# Patient Record
Sex: Male | Born: 1940 | Race: Black or African American | Hispanic: No | Marital: Married | Smoking: Former smoker
Health system: Southern US, Community
[De-identification: ages and names within clinical notes are randomized; demographics above are authoritative.]

## PROBLEM LIST (undated history)

## (undated) DIAGNOSIS — Z85038 Personal history of other malignant neoplasm of large intestine: Secondary | ICD-10-CM

## (undated) DIAGNOSIS — F32A Depression, unspecified: Secondary | ICD-10-CM

## (undated) DIAGNOSIS — N183 Chronic kidney disease, stage 3 unspecified: Secondary | ICD-10-CM

## (undated) DIAGNOSIS — E119 Type 2 diabetes mellitus without complications: Secondary | ICD-10-CM

## (undated) DIAGNOSIS — E7800 Pure hypercholesterolemia, unspecified: Secondary | ICD-10-CM

## (undated) DIAGNOSIS — I10 Essential (primary) hypertension: Secondary | ICD-10-CM

## (undated) DIAGNOSIS — E11319 Type 2 diabetes mellitus with unspecified diabetic retinopathy without macular edema: Secondary | ICD-10-CM

## (undated) DIAGNOSIS — Z8739 Personal history of other diseases of the musculoskeletal system and connective tissue: Secondary | ICD-10-CM

## (undated) DIAGNOSIS — R296 Repeated falls: Secondary | ICD-10-CM

## (undated) DIAGNOSIS — I639 Cerebral infarction, unspecified: Secondary | ICD-10-CM

## (undated) DIAGNOSIS — R413 Other amnesia: Secondary | ICD-10-CM

## (undated) DIAGNOSIS — I700 Atherosclerosis of aorta: Secondary | ICD-10-CM

## (undated) DIAGNOSIS — J449 Chronic obstructive pulmonary disease, unspecified: Secondary | ICD-10-CM

## (undated) DIAGNOSIS — K219 Gastro-esophageal reflux disease without esophagitis: Secondary | ICD-10-CM

## (undated) DIAGNOSIS — F329 Major depressive disorder, single episode, unspecified: Secondary | ICD-10-CM

## (undated) HISTORY — DX: Depression, unspecified: F32.A

## (undated) HISTORY — DX: Essential (primary) hypertension: I10

## (undated) HISTORY — DX: Pure hypercholesterolemia, unspecified: E78.00

## (undated) HISTORY — DX: Chronic obstructive pulmonary disease, unspecified: J44.9

## (undated) HISTORY — DX: Cerebral infarction, unspecified: I63.9

## (undated) HISTORY — DX: Chronic kidney disease, stage 3 unspecified: N18.30

## (undated) HISTORY — DX: Atherosclerosis of aorta: I70.0

## (undated) HISTORY — DX: Other amnesia: R41.3

## (undated) HISTORY — DX: Type 2 diabetes mellitus with unspecified diabetic retinopathy without macular edema: E11.319

## (undated) HISTORY — DX: Gastro-esophageal reflux disease without esophagitis: K21.9

## (undated) HISTORY — DX: Major depressive disorder, single episode, unspecified: F32.9

## (undated) HISTORY — DX: Personal history of other diseases of the musculoskeletal system and connective tissue: Z87.39

## (undated) HISTORY — DX: Repeated falls: R29.6

## (undated) HISTORY — DX: Personal history of other malignant neoplasm of large intestine: Z85.038

## (undated) HISTORY — DX: Type 2 diabetes mellitus without complications: E11.9

---

## 1999-01-04 ENCOUNTER — Ambulatory Visit (HOSPITAL_COMMUNITY): Admission: RE | Admit: 1999-01-04 | Discharge: 1999-01-04 | Payer: Self-pay | Admitting: Cardiovascular Disease

## 2000-01-14 ENCOUNTER — Emergency Department (HOSPITAL_COMMUNITY): Admission: EM | Admit: 2000-01-14 | Discharge: 2000-01-14 | Payer: Self-pay | Admitting: Emergency Medicine

## 2000-01-14 ENCOUNTER — Encounter: Payer: Self-pay | Admitting: Emergency Medicine

## 2000-01-15 ENCOUNTER — Encounter: Payer: Self-pay | Admitting: Emergency Medicine

## 2000-01-15 ENCOUNTER — Inpatient Hospital Stay (HOSPITAL_COMMUNITY): Admission: EM | Admit: 2000-01-15 | Discharge: 2000-01-30 | Payer: Self-pay | Admitting: Emergency Medicine

## 2000-01-15 ENCOUNTER — Encounter (INDEPENDENT_AMBULATORY_CARE_PROVIDER_SITE_OTHER): Payer: Self-pay | Admitting: *Deleted

## 2000-01-16 ENCOUNTER — Encounter: Payer: Self-pay | Admitting: Internal Medicine

## 2000-01-18 ENCOUNTER — Encounter: Payer: Self-pay | Admitting: Internal Medicine

## 2000-01-19 ENCOUNTER — Encounter: Payer: Self-pay | Admitting: Internal Medicine

## 2000-01-20 ENCOUNTER — Encounter: Payer: Self-pay | Admitting: Internal Medicine

## 2000-01-21 ENCOUNTER — Encounter: Payer: Self-pay | Admitting: Thoracic Surgery

## 2000-01-22 ENCOUNTER — Encounter: Payer: Self-pay | Admitting: Thoracic Surgery

## 2000-01-23 ENCOUNTER — Encounter: Payer: Self-pay | Admitting: Thoracic Surgery

## 2000-01-24 ENCOUNTER — Encounter: Payer: Self-pay | Admitting: Thoracic Surgery

## 2000-01-25 ENCOUNTER — Encounter: Payer: Self-pay | Admitting: Thoracic Surgery

## 2000-01-26 ENCOUNTER — Encounter: Payer: Self-pay | Admitting: Thoracic Surgery

## 2000-01-27 ENCOUNTER — Encounter: Payer: Self-pay | Admitting: Thoracic Surgery

## 2000-01-29 ENCOUNTER — Encounter: Payer: Self-pay | Admitting: Thoracic Surgery

## 2000-02-15 ENCOUNTER — Encounter: Payer: Self-pay | Admitting: Thoracic Surgery

## 2000-02-15 ENCOUNTER — Encounter: Admission: RE | Admit: 2000-02-15 | Discharge: 2000-02-15 | Payer: Self-pay | Admitting: Thoracic Surgery

## 2000-02-16 ENCOUNTER — Encounter: Admission: RE | Admit: 2000-02-16 | Discharge: 2000-02-16 | Payer: Self-pay | Admitting: Family Medicine

## 2000-03-07 ENCOUNTER — Encounter: Payer: Self-pay | Admitting: Thoracic Surgery

## 2000-03-07 ENCOUNTER — Encounter: Admission: RE | Admit: 2000-03-07 | Discharge: 2000-03-07 | Payer: Self-pay | Admitting: Thoracic Surgery

## 2000-03-20 ENCOUNTER — Encounter: Admission: RE | Admit: 2000-03-20 | Discharge: 2000-03-20 | Payer: Self-pay | Admitting: Family Medicine

## 2000-04-04 ENCOUNTER — Encounter: Admission: RE | Admit: 2000-04-04 | Discharge: 2000-04-04 | Payer: Self-pay | Admitting: Family Medicine

## 2000-04-06 ENCOUNTER — Encounter: Payer: Self-pay | Admitting: Thoracic Surgery

## 2000-04-06 ENCOUNTER — Encounter: Admission: RE | Admit: 2000-04-06 | Discharge: 2000-04-06 | Payer: Self-pay | Admitting: Thoracic Surgery

## 2000-06-05 ENCOUNTER — Encounter: Payer: Self-pay | Admitting: Thoracic Surgery

## 2000-06-05 ENCOUNTER — Encounter: Admission: RE | Admit: 2000-06-05 | Discharge: 2000-06-05 | Payer: Self-pay | Admitting: Thoracic Surgery

## 2000-06-08 ENCOUNTER — Encounter: Admission: RE | Admit: 2000-06-08 | Discharge: 2000-06-08 | Payer: Self-pay | Admitting: Family Medicine

## 2000-06-18 ENCOUNTER — Encounter: Admission: RE | Admit: 2000-06-18 | Discharge: 2000-06-18 | Payer: Self-pay | Admitting: Family Medicine

## 2000-06-19 ENCOUNTER — Encounter: Admission: RE | Admit: 2000-06-19 | Discharge: 2000-09-17 | Payer: Self-pay | Admitting: *Deleted

## 2000-07-31 ENCOUNTER — Encounter: Admission: RE | Admit: 2000-07-31 | Discharge: 2000-07-31 | Payer: Self-pay | Admitting: Family Medicine

## 2000-09-26 ENCOUNTER — Encounter: Admission: RE | Admit: 2000-09-26 | Discharge: 2000-09-26 | Payer: Self-pay | Admitting: Family Medicine

## 2001-06-03 ENCOUNTER — Encounter: Admission: RE | Admit: 2001-06-03 | Discharge: 2001-06-03 | Payer: Self-pay | Admitting: Family Medicine

## 2001-06-17 ENCOUNTER — Encounter: Admission: RE | Admit: 2001-06-17 | Discharge: 2001-06-17 | Payer: Self-pay | Admitting: Family Medicine

## 2001-07-17 ENCOUNTER — Encounter: Admission: RE | Admit: 2001-07-17 | Discharge: 2001-07-17 | Payer: Self-pay | Admitting: Family Medicine

## 2001-09-04 ENCOUNTER — Encounter: Admission: RE | Admit: 2001-09-04 | Discharge: 2001-09-04 | Payer: Self-pay | Admitting: Family Medicine

## 2001-09-18 ENCOUNTER — Encounter: Admission: RE | Admit: 2001-09-18 | Discharge: 2001-09-18 | Payer: Self-pay | Admitting: Family Medicine

## 2002-01-14 ENCOUNTER — Encounter: Admission: RE | Admit: 2002-01-14 | Discharge: 2002-01-14 | Payer: Self-pay | Admitting: Family Medicine

## 2002-03-18 ENCOUNTER — Encounter: Admission: RE | Admit: 2002-03-18 | Discharge: 2002-03-18 | Payer: Self-pay | Admitting: Sports Medicine

## 2002-04-23 ENCOUNTER — Encounter: Admission: RE | Admit: 2002-04-23 | Discharge: 2002-04-23 | Payer: Self-pay | Admitting: Family Medicine

## 2002-06-10 ENCOUNTER — Encounter: Admission: RE | Admit: 2002-06-10 | Discharge: 2002-06-10 | Payer: Self-pay | Admitting: Family Medicine

## 2002-06-24 ENCOUNTER — Encounter: Admission: RE | Admit: 2002-06-24 | Discharge: 2002-06-24 | Payer: Self-pay | Admitting: Family Medicine

## 2002-08-05 ENCOUNTER — Encounter: Admission: RE | Admit: 2002-08-05 | Discharge: 2002-08-05 | Payer: Self-pay | Admitting: Family Medicine

## 2003-06-12 ENCOUNTER — Encounter: Admission: RE | Admit: 2003-06-12 | Discharge: 2003-06-12 | Payer: Self-pay | Admitting: Family Medicine

## 2003-07-20 ENCOUNTER — Encounter: Admission: RE | Admit: 2003-07-20 | Discharge: 2003-07-20 | Payer: Self-pay | Admitting: Family Medicine

## 2006-10-18 DIAGNOSIS — F1010 Alcohol abuse, uncomplicated: Secondary | ICD-10-CM | POA: Insufficient documentation

## 2006-10-18 DIAGNOSIS — K219 Gastro-esophageal reflux disease without esophagitis: Secondary | ICD-10-CM | POA: Insufficient documentation

## 2006-10-18 DIAGNOSIS — J4489 Other specified chronic obstructive pulmonary disease: Secondary | ICD-10-CM | POA: Insufficient documentation

## 2006-10-18 DIAGNOSIS — E119 Type 2 diabetes mellitus without complications: Secondary | ICD-10-CM

## 2006-10-18 DIAGNOSIS — J449 Chronic obstructive pulmonary disease, unspecified: Secondary | ICD-10-CM

## 2006-10-18 DIAGNOSIS — F339 Major depressive disorder, recurrent, unspecified: Secondary | ICD-10-CM

## 2006-12-17 ENCOUNTER — Ambulatory Visit (HOSPITAL_BASED_OUTPATIENT_CLINIC_OR_DEPARTMENT_OTHER): Admission: RE | Admit: 2006-12-17 | Discharge: 2006-12-17 | Payer: Self-pay | Admitting: Unknown Physician Specialty

## 2006-12-17 ENCOUNTER — Encounter (INDEPENDENT_AMBULATORY_CARE_PROVIDER_SITE_OTHER): Payer: Self-pay | Admitting: Specialist

## 2009-02-18 ENCOUNTER — Encounter: Admission: RE | Admit: 2009-02-18 | Discharge: 2009-02-18 | Payer: Self-pay | Admitting: Surgery

## 2009-03-05 ENCOUNTER — Inpatient Hospital Stay (HOSPITAL_COMMUNITY): Admission: RE | Admit: 2009-03-05 | Discharge: 2009-03-09 | Payer: Self-pay | Admitting: Surgery

## 2009-03-05 ENCOUNTER — Encounter (INDEPENDENT_AMBULATORY_CARE_PROVIDER_SITE_OTHER): Payer: Self-pay | Admitting: Surgery

## 2009-03-29 ENCOUNTER — Ambulatory Visit: Payer: Self-pay | Admitting: Oncology

## 2009-04-01 LAB — CBC WITH DIFFERENTIAL/PLATELET
BASO%: 0 % (ref 0.0–2.0)
EOS%: 1.3 % (ref 0.0–7.0)
HCT: 33.5 % — ABNORMAL LOW (ref 38.4–49.9)
LYMPH%: 23.9 % (ref 14.0–49.0)
MCH: 21.1 pg — ABNORMAL LOW (ref 27.2–33.4)
MCHC: 31.4 g/dL — ABNORMAL LOW (ref 32.0–36.0)
MCV: 67.3 fL — ABNORMAL LOW (ref 79.3–98.0)
MONO%: 13.9 % (ref 0.0–14.0)
NEUT%: 60.9 % (ref 39.0–75.0)
lymph#: 1.7 10*3/uL (ref 0.9–3.3)

## 2009-04-01 LAB — COMPREHENSIVE METABOLIC PANEL
ALT: 10 U/L (ref 0–53)
AST: 14 U/L (ref 0–37)
Alkaline Phosphatase: 56 U/L (ref 39–117)
Chloride: 108 mEq/L (ref 96–112)
Creatinine, Ser: 0.87 mg/dL (ref 0.40–1.50)
Total Bilirubin: 0.3 mg/dL (ref 0.3–1.2)

## 2009-06-29 ENCOUNTER — Ambulatory Visit: Payer: Self-pay | Admitting: Oncology

## 2009-07-01 LAB — COMPREHENSIVE METABOLIC PANEL
Albumin: 4.4 g/dL (ref 3.5–5.2)
CO2: 25 mEq/L (ref 19–32)
Glucose, Bld: 195 mg/dL — ABNORMAL HIGH (ref 70–99)
Potassium: 3.9 mEq/L (ref 3.5–5.3)
Sodium: 141 mEq/L (ref 135–145)
Total Protein: 7.3 g/dL (ref 6.0–8.3)

## 2009-07-01 LAB — CBC WITH DIFFERENTIAL/PLATELET
Basophils Absolute: 0 10*3/uL (ref 0.0–0.1)
Eosinophils Absolute: 0.1 10*3/uL (ref 0.0–0.5)
HGB: 14.3 g/dL (ref 13.0–17.1)
MONO#: 0.5 10*3/uL (ref 0.1–0.9)
NEUT#: 3.5 10*3/uL (ref 1.5–6.5)
RDW: 17 % — ABNORMAL HIGH (ref 11.0–14.6)
lymph#: 1.9 10*3/uL (ref 0.9–3.3)

## 2009-07-01 LAB — IRON AND TIBC
%SAT: 19 % — ABNORMAL LOW (ref 20–55)
Iron: 71 ug/dL (ref 42–165)
TIBC: 368 ug/dL (ref 215–435)

## 2009-07-01 LAB — FERRITIN: Ferritin: 43 ng/mL (ref 22–322)

## 2009-07-01 LAB — CEA: CEA: 3.2 ng/mL (ref 0.0–5.0)

## 2009-09-24 ENCOUNTER — Ambulatory Visit: Payer: Self-pay | Admitting: Oncology

## 2009-09-28 ENCOUNTER — Ambulatory Visit (HOSPITAL_COMMUNITY): Admission: RE | Admit: 2009-09-28 | Discharge: 2009-09-28 | Payer: Self-pay | Admitting: Oncology

## 2009-09-28 LAB — COMPREHENSIVE METABOLIC PANEL
AST: 29 U/L (ref 0–37)
Albumin: 4.1 g/dL (ref 3.5–5.2)
Alkaline Phosphatase: 59 U/L (ref 39–117)
BUN: 7 mg/dL (ref 6–23)
Calcium: 9.9 mg/dL (ref 8.4–10.5)
Chloride: 103 mEq/L (ref 96–112)
Creatinine, Ser: 0.96 mg/dL (ref 0.40–1.50)
Total Bilirubin: 0.6 mg/dL (ref 0.3–1.2)
Total Protein: 7.2 g/dL (ref 6.0–8.3)

## 2009-09-28 LAB — CBC WITH DIFFERENTIAL/PLATELET
EOS%: 1.9 % (ref 0.0–7.0)
Eosinophils Absolute: 0.1 10*3/uL (ref 0.0–0.5)
HGB: 14.6 g/dL (ref 13.0–17.1)
MCHC: 33.5 g/dL (ref 32.0–36.0)
MONO#: 0.7 10*3/uL (ref 0.1–0.9)
MONO%: 9.2 % (ref 0.0–14.0)
NEUT%: 63.6 % (ref 39.0–75.0)
Platelets: 163 10*3/uL (ref 140–400)
WBC: 7.1 10*3/uL (ref 4.0–10.3)

## 2010-02-07 ENCOUNTER — Ambulatory Visit: Payer: Self-pay | Admitting: Oncology

## 2010-02-09 LAB — CBC WITH DIFFERENTIAL/PLATELET
Basophils Absolute: 0 10*3/uL (ref 0.0–0.1)
EOS%: 1.5 % (ref 0.0–7.0)
HGB: 14.4 g/dL (ref 13.0–17.1)
MCV: 83.8 fL (ref 79.3–98.0)
MONO#: 0.7 10*3/uL (ref 0.1–0.9)
NEUT%: 57 % (ref 39.0–75.0)
RBC: 5.07 10*6/uL (ref 4.20–5.82)
lymph#: 2 10*3/uL (ref 0.9–3.3)

## 2010-02-09 LAB — COMPREHENSIVE METABOLIC PANEL
Alkaline Phosphatase: 63 U/L (ref 39–117)
BUN: 10 mg/dL (ref 6–23)
Calcium: 9.8 mg/dL (ref 8.4–10.5)
Glucose, Bld: 143 mg/dL — ABNORMAL HIGH (ref 70–99)
Potassium: 4 mEq/L (ref 3.5–5.3)
Sodium: 137 mEq/L (ref 135–145)
Total Bilirubin: 0.5 mg/dL (ref 0.3–1.2)
Total Protein: 7.1 g/dL (ref 6.0–8.3)

## 2010-06-07 ENCOUNTER — Ambulatory Visit: Payer: Self-pay | Admitting: Oncology

## 2010-06-09 LAB — CBC WITH DIFFERENTIAL/PLATELET
BASO%: 0 % (ref 0.0–2.0)
EOS%: 0.9 % (ref 0.0–7.0)
Eosinophils Absolute: 0.1 10*3/uL (ref 0.0–0.5)
HCT: 41.7 % (ref 38.4–49.9)
HGB: 14.2 g/dL (ref 13.0–17.1)
MCHC: 34 g/dL (ref 32.0–36.0)
MCV: 84 fL (ref 79.3–98.0)
RBC: 4.96 10*6/uL (ref 4.20–5.82)
RDW: 14.1 % (ref 11.0–14.6)
WBC: 7 10*3/uL (ref 4.0–10.3)

## 2010-06-09 LAB — COMPREHENSIVE METABOLIC PANEL
Calcium: 9.9 mg/dL (ref 8.4–10.5)
Glucose, Bld: 172 mg/dL — ABNORMAL HIGH (ref 70–99)
Potassium: 3.9 mEq/L (ref 3.5–5.3)
Total Bilirubin: 0.6 mg/dL (ref 0.3–1.2)
Total Protein: 6.8 g/dL (ref 6.0–8.3)

## 2010-06-09 LAB — CEA: CEA: 3.8 ng/mL (ref 0.0–5.0)

## 2010-09-09 ENCOUNTER — Other Ambulatory Visit: Payer: Self-pay | Admitting: Oncology

## 2010-09-09 DIAGNOSIS — C189 Malignant neoplasm of colon, unspecified: Secondary | ICD-10-CM

## 2010-09-09 DIAGNOSIS — R9389 Abnormal findings on diagnostic imaging of other specified body structures: Secondary | ICD-10-CM

## 2010-09-11 ENCOUNTER — Encounter: Payer: Self-pay | Admitting: Oncology

## 2010-10-05 ENCOUNTER — Other Ambulatory Visit (HOSPITAL_COMMUNITY): Payer: Self-pay

## 2010-10-10 ENCOUNTER — Ambulatory Visit (HOSPITAL_COMMUNITY)
Admission: RE | Admit: 2010-10-10 | Discharge: 2010-10-10 | Disposition: A | Discharge status: Home / Self Care | Payer: MEDICARE | Source: Ambulatory Visit | Attending: Oncology | Admitting: Oncology

## 2010-10-10 ENCOUNTER — Other Ambulatory Visit: Payer: Self-pay | Admitting: Oncology

## 2010-10-10 ENCOUNTER — Encounter (HOSPITAL_BASED_OUTPATIENT_CLINIC_OR_DEPARTMENT_OTHER): Payer: MEDICARE | Admitting: Oncology

## 2010-10-10 DIAGNOSIS — C189 Malignant neoplasm of colon, unspecified: Secondary | ICD-10-CM

## 2010-10-10 DIAGNOSIS — E041 Nontoxic single thyroid nodule: Secondary | ICD-10-CM | POA: Insufficient documentation

## 2010-10-10 DIAGNOSIS — D509 Iron deficiency anemia, unspecified: Secondary | ICD-10-CM

## 2010-10-10 DIAGNOSIS — K8020 Calculus of gallbladder without cholecystitis without obstruction: Secondary | ICD-10-CM | POA: Insufficient documentation

## 2010-10-10 DIAGNOSIS — K7689 Other specified diseases of liver: Secondary | ICD-10-CM | POA: Insufficient documentation

## 2010-10-10 DIAGNOSIS — N400 Enlarged prostate without lower urinary tract symptoms: Secondary | ICD-10-CM | POA: Insufficient documentation

## 2010-10-10 DIAGNOSIS — R9389 Abnormal findings on diagnostic imaging of other specified body structures: Secondary | ICD-10-CM

## 2010-10-10 LAB — CBC WITH DIFFERENTIAL/PLATELET
BASO%: 0.3 % (ref 0.0–2.0)
Eosinophils Absolute: 0.1 10*3/uL (ref 0.0–0.5)
HCT: 40.6 % (ref 38.4–49.9)
HGB: 14.1 g/dL (ref 13.0–17.1)
LYMPH%: 30.9 % (ref 14.0–49.0)
MONO#: 0.8 10*3/uL (ref 0.1–0.9)
MONO%: 10.5 % (ref 0.0–14.0)
NEUT%: 56.7 % (ref 39.0–75.0)
WBC: 7.7 10*3/uL (ref 4.0–10.3)

## 2010-10-10 LAB — CMP (CANCER CENTER ONLY)
AST: 28 U/L (ref 11–38)
Albumin: 3.6 g/dL (ref 3.3–5.5)
Alkaline Phosphatase: 55 U/L (ref 26–84)
BUN, Bld: 9 mg/dL (ref 7–22)

## 2010-10-10 LAB — CEA: CEA: 3.9 ng/mL (ref 0.0–5.0)

## 2010-10-10 MED ORDER — IOHEXOL 300 MG/ML  SOLN
100.0000 mL | Freq: Once | INTRAMUSCULAR | Status: AC | PRN
  Administered 2010-10-10: 100 mL via INTRAVENOUS

## 2010-10-12 ENCOUNTER — Encounter (HOSPITAL_BASED_OUTPATIENT_CLINIC_OR_DEPARTMENT_OTHER): Payer: MEDICARE | Admitting: Oncology

## 2010-10-12 DIAGNOSIS — D509 Iron deficiency anemia, unspecified: Secondary | ICD-10-CM

## 2010-10-12 DIAGNOSIS — C189 Malignant neoplasm of colon, unspecified: Secondary | ICD-10-CM

## 2010-11-27 LAB — CBC
HCT: 23.2 % — ABNORMAL LOW (ref 39.0–52.0)
HCT: 23.9 % — ABNORMAL LOW (ref 39.0–52.0)
HCT: 26.3 % — ABNORMAL LOW (ref 39.0–52.0)
HCT: 29.2 % — ABNORMAL LOW (ref 39.0–52.0)
Hemoglobin: 7.4 g/dL — CL (ref 13.0–17.0)
Hemoglobin: 9 g/dL — ABNORMAL LOW (ref 13.0–17.0)
MCHC: 30.9 g/dL (ref 30.0–36.0)
MCV: 63.4 fL — ABNORMAL LOW (ref 78.0–100.0)
MCV: 64.4 fL — ABNORMAL LOW (ref 78.0–100.0)
MCV: 64.9 fL — ABNORMAL LOW (ref 78.0–100.0)
Platelets: 205 10*3/uL (ref 150–400)
RBC: 3.66 MIL/uL — ABNORMAL LOW (ref 4.22–5.81)
RBC: 4.48 MIL/uL (ref 4.22–5.81)
RDW: 23.1 % — ABNORMAL HIGH (ref 11.5–15.5)
WBC: 11.7 10*3/uL — ABNORMAL HIGH (ref 4.0–10.5)
WBC: 9.9 10*3/uL (ref 4.0–10.5)

## 2010-11-27 LAB — GLUCOSE, CAPILLARY
Glucose-Capillary: 100 mg/dL — ABNORMAL HIGH (ref 70–99)
Glucose-Capillary: 118 mg/dL — ABNORMAL HIGH (ref 70–99)
Glucose-Capillary: 121 mg/dL — ABNORMAL HIGH (ref 70–99)
Glucose-Capillary: 129 mg/dL — ABNORMAL HIGH (ref 70–99)
Glucose-Capillary: 132 mg/dL — ABNORMAL HIGH (ref 70–99)
Glucose-Capillary: 132 mg/dL — ABNORMAL HIGH (ref 70–99)
Glucose-Capillary: 143 mg/dL — ABNORMAL HIGH (ref 70–99)
Glucose-Capillary: 154 mg/dL — ABNORMAL HIGH (ref 70–99)
Glucose-Capillary: 180 mg/dL — ABNORMAL HIGH (ref 70–99)
Glucose-Capillary: 91 mg/dL (ref 70–99)

## 2010-11-27 LAB — BASIC METABOLIC PANEL
BUN: 4 mg/dL — ABNORMAL LOW (ref 6–23)
CO2: 25 mEq/L (ref 19–32)
Calcium: 9.7 mg/dL (ref 8.4–10.5)
Chloride: 107 mEq/L (ref 96–112)
Chloride: 110 mEq/L (ref 96–112)
Chloride: 107 mEq/L (ref 96–112)
GFR calc Af Amer: >60 mL/min (ref >60)
GFR calc Af Amer: >60 mL/min (ref >60)
GFR calc non Af Amer: >60 mL/min (ref >60)
GFR calc non Af Amer: >60 mL/min (ref >60)
Potassium: 3.4 mEq/L — ABNORMAL LOW (ref 3.5–5.1)
Potassium: 3.4 mEq/L — ABNORMAL LOW (ref 3.5–5.1)
Potassium: 4.3 mEq/L (ref 3.5–5.1)
Sodium: 143 mEq/L (ref 135–145)
Sodium: 140 mEq/L (ref 135–145)

## 2010-11-27 LAB — CREATININE, SERUM
Creatinine, Ser: 0.9 mg/dL (ref 0.4–1.5)
GFR calc Af Amer: >60 mL/min (ref >60)
GFR calc non Af Amer: >60 mL/min (ref >60)

## 2010-11-27 LAB — POTASSIUM: Potassium: 3.9 mEq/L (ref 3.5–5.1)

## 2011-01-03 NOTE — Discharge Summary (Signed)
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   OTHER DIAGNOSES:  1. Anemia secondary to above.  2. Diabetes non-insulin-requiring.  3. Hyperlipidemia.  4. Pneumonia with empyema status post chest tube thoracostomy in 2001.  5. Erectile dysfunction.   PROCEDURE PERFORMED:  Laparoscopic-assisted right partial colectomy with  lysis of adhesions anastomosis on March 05, 2009.   Pathology shows pT3, PN0 adenocarcinoma, 0/22 lymph nodes positive, KRAS  pending, perhaps consistent with a very early pT3 adenocarcinoma, grade  2, margins free.   SUMMARY OF HOSPITAL COURSE:  Mr. Esquivias is a 70 year old male with  anemia and heme-positive stool was found by endoscopy to have numerous  polyps and a large polyp in the proximal ascending colon with biopsy  consistent with adenocarcinoma.  Surgery consultation was made with me.  After discussion and informed consent, he underwent laparoscopic lysis  of adhesions and colectomy with anastomosis on March 05, 2009.   He was started on an anti-ileus protocol and it was advanced on his  diet.  He was gradually transitioned off IV fluids.  He did have  moderate anemia with his hemoglobin going from 9 to the 7s, but he  remained hemodynamically stable with no hypotension.  Had some mild  hypokalemia, which was improved with oral supplementation.  He was  walking in the hallways without difficulty.  He was tolerating solid  diet  without any difficulty.  He had flatus and he had a bowel movement.  His pain was controlled with oral medications.   Based on these improvements, it will be reasonable to discharge him home  with follow instructions:  1. He is to return to clinic to see me in about 10 days to make sure      he is continuing to improve.  2. He should take continuous oral iron for his anemia to allow his      body recuperate.  3. He should take oxycodone 5-15 mg p.o. q.4 h. p.r.n. pain along with      Aleve 1-2 p.o. q.12 h. p.r.n. pain and ice pack and/or heating pad      p.r.n. pain.  4. He should take Phenergan 12.5-25 mg p.o. q.6 h. p.r.n. nausea.  5. He should call if he has worsening fevers, chills, sweats, nausea,      vomiting, worsening abdominal pain, uncontrolled diarrhea, drainage      from incisions, wound infection, or other concerns.  6. He should take fiber at least twice daily to keep his bowels      regular and  drink plenty of liquids to avoid dehydration and      actually should take multivitamin with iron for his anemia and      avoid any further hypokalemia.  7. We will probably tentatively schedule [employer] reevaluation      to see if he would benefit from post-adjuvant chemotherapy given      the fact that he is T3 N0 even though looks relatively early.  We      will give him time to recover surgery before we schedule that.      Ardeth Sportsman, MD  Electronically Signed     SCG/MEDQ  D:  03/09/2009  T:  03/09/2009  Job:  [id number]   cc:   Asencion Partridge I. Odogwu, M.D.  Dani Gobble, MD  Sigmund Hazel, M.D.  Shirley Friar, MD

## 2011-01-03 NOTE — Op Note (Signed)
NAME:  Ronald Dyer, Ronald Dyer NO.:  [id number]   MEDICAL RECORD NO.:  [id number]          PATIENT TYPE:  INP   LOCATION:  5126                         FACILITY:  [facility]   PHYSICIAN:  Ardeth Sportsman, MD     DATE OF BIRTH:  [birth year]   DATE OF PROCEDURE:  03/05/2009  DATE OF DISCHARGE:                               OPERATIVE REPORT   PRIMARY CARE PHYSICIAN:  Sigmund Hazel, MD   GASTROENTEROLOGIST:  Shirley Friar, MD   SURGEON:  Ardeth Sportsman, MD   ASSISTANT:  Anselm Pancoast. Zachery Dakins, MD   PREOPERATIVE DIAGNOSES:  1. Adenocarcinoma of the ascending colon.  2. Umbilical hernia.   POSTOPERATIVE DIAGNOSES:  1. Adenocarcinoma of the ascending colon.  2. Umbilical hernia with chronic hernia sac.   PROCEDURE PERFORMED:  1. Laparoscopic lysis of adhesions x60 minutes.  2. Laparoscopically assisted right hemicolectomy with a ileo-      midtransverse colonic anastomosis.  3. Primary umbilical hernia repair within the incision.   SPECIMENS:  Right colon.   DRAINS:  None.   ESTIMATED BLOOD LOSS:  100 mL.   COMPLICATIONS:  None apparent.   INDICATIONS:  Mr. Stemmer is a 70 year old male who has had issues  with heme-positive stool.  He was found to have a colonoscopy, which  showed numerous polyps that were adenomatous and removed, except for  there was a very large tumor in the proximal ascending colon and that  biopsy came back consistent with adenocarcinoma.  Dr. Bosie Clos requested  surgical evaluation.   The anatomy and physiology of the digestive tract was explained.  Pathophysiology of colon cancer was discussed.  The discussion was made  to the family with a strong family history of colon cancer, I strongly  advised his family members that his children and first-degree relatives  consider colonoscopy if that has not been done after 70 years of age if  not sooner.   Technique of excision was done.  I thought he would be a reasonable  laparoscopic  candidate.  Risks, benefits, and alternatives were  discussed.  Questions were answered and he agreed to proceed.   OPERATIVE FINDINGS:  He had a very large tumor of his proximal ascending  colon involving at least two-thirds of the circumference of the colon.  There was no evidence of any diffuse metastatic disease.  He did have  some bulky mesenteric repairing for some lymphatic metastasis, but  nothing unusually or grossly obvious.   DESCRIPTION OF PROCEDURE:  Informed consent was confirmed.  The patient  received IV Invanz just prior to surgery after he was on the Entereg  postop ileus protocol.  He received subcutaneous heparin around the time  of surgery.  He had sequential compression devices active during the  entire case.  He underwent general anesthesia without any difficulty.  He was positioned in low lithotomy with arms tucked.  He had a Foley  catheter sterilely placed.   A #5 mm port was placed in the left upper quadrant using optical entry  technique after which a gastric tube had been placed.  This has done  well.  The patient was in steep reverse Trendelenburg and left side up.  A camera inspection revealed no intraabdominal entry.  There was no  evidence of any metastatic disease on the liver or peritoneal surfaces.  Under direct visualization, 5 mm ports were placed in the left lower  quadrant suprapubically and right lower quadrant.  A 10 mm port was  placed through the umbilicus.   The patient was positioned with head down and right side down.  Greater  omentum was adherent to the right lower quadrant and this was carefully  freed off.  Small bowel was freed from its attachment down in the  abdomen and rolled up in to the left upper quadrant.  Eventually, I was  able to see the base of the ileocolic mesentery.  The ileocolonic  mesentery was scored, and I was able to get posterior to the end of the  colonic vessels into the retroperitoneum on the right side.  The  gonadal  vessels and the left iliac vessel could be seen, and they were carefully  reflected posteriorly.  A careful dissection was done to free up the  right colon mesentery in a medial to lateral fashion.  In freeing that  up, I could see the sweep of the second and third parts of the duodenum  and I was kept as posterior as possible.  Most of the dissection was  done bluntly since that was relatively avascular plane with occasionally  cold scissors as well as Enseal bipolar ligation was used.  Eventually,  I got good mobilization beneath the hepatic flexure to the right side  and the midtransverse colon.   The colon was mobilized in lateral-medial fashion, first by fraying off  the attachments to the pelvic rim on the terminal ileum and appendix and  cecum.  There did not seem to be any invasion into the pelvic sidewall,  but I did take the peritoneum along this area, followed up the hepatic  flexure, and was able to reflect the proximal transverse colon and such  that the right colon could be completely reflected over the left side.   We made a window into the lesser sac between the midtransverse colon and  freed of the greater omentum more proximally towards the hepatic flexure  to get more further mobilization on the colon.   A GelPort wound protector was placed initially through a 5-cm umbilical  incision, and then extended to a 6-cm incision.  We encountered a hernia  sac and freed that off, skeletonized it off, and removed the sac  contents.  He had some redundant falciform ligament within there, and  that was carefully freed off.  A GelPort was placed.  The right colon  was eviscerated.  I could see the top of the obvious tumor in the  ascending colon.  During this, the proximal transverse colon came up,  but not the midtransverse colon.  The colon was reduced back in and  greater omentum was taken back more towards the distal colon.  We ended  up having to take the right  branch of the middle colic vessels as well  since the midtransverse colon mesentery was extremely short.  With that,  I was able to eviscerate it and get much better mobilization of the  right colon.   The colon was transected with a side-to-side stapled anastomosis doing a  distal ileum to midtransverse colon anastomosis.  This was done using a  GIA 75 stapler, and then closing  the common defect transecting the ileum  and colon using a TA 90.  The mesenteric was taken with a Enseal in a  ray-like fashion.  The common mesenteric defect was closed using  interrupted 2-0 Vicryl stitches.  Hemostasis was excellent.   The specimen was returned back to the abdomen and the GelPort was  closed.  A diagnostic laparoscopy was performed.  The mesentery laid  well with no turning or twisting.  The mesenteric defect was closed.  The small bowel laid well with no evidence of any internal hernias or  other abnormalities.  Hemostasis was excellent.  Capnoperitoneum was  evacuated, and the final ports were removed.  The periumbilical midline  fascial defect was closed using #1 PDS at the fascial level, and the  skin was closed with interrupted 4-0 Monocryl stitch.  The rest of the  port sites were closed with 4-0 Monocryl stitch.  Sterile dressing was  applied.   The patient was extubated and sent to recovery room in stable condition.   I discussed postoperative care in detail with the patient in our office  with his family, and then with the patient just prior to surgery and  with his family afterwards as well.      Ardeth Sportsman, MD  Electronically Signed     SCG/MEDQ  D:  03/05/2009  T:  03/06/2009  Job:  [id number]   cc:   [facility]  GI Tumor Conference Ms. Adaline Paulene Floor, M.D.  Shirley Friar, MD

## 2011-01-06 NOTE — Discharge Summary (Signed)
[place]  Patient:    Ronald Dyer, Ronald Dyer                      MRN: [id number] Adm. Date:  [id number] Disc. Date: 01/29/00 Attending:  Edwyna Perfect Dictator:   Sherrie George, P.A. CC:         D. Karle Plumber, M.D.             Gary Fleet, M.D.                           Discharge Summary  ADMISSION DIAGNOSES: 1. Probable bilateral pneumonia. 2. 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 3. Abdominal pain of uncertain etiology. 4. History of alcohol use, pint of scotch per weekend.  DISCHARGE DIAGNOSES: 1. Pneumonia. 2. Left chest empyema. 3. 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 4. History of alcohol use.  PROCEDURES: 1. Left video assisted thoracoscopy. 2. Left mini thoracotomy with decortication drainage of left chest. 3. Mini thoracotomy.  BRIEF HISTORY:  The patient is a 70 year old black male who presented with complaints of chest discomfort, cough, which was nonproductive.  The patient denied diaphoresis or fever or chills.  Patient was given Z-pack but returned with persistent discomfort.  Patient was ultimately admitted and was diagnosed to have probable pneumonia with bilateral infiltrates on a chest x-ray. Patient had Rocephin added.  Patient also had some abdominal pain and no etiology for this was ever found.  Patient did have a history of alcohol use. Patient was treated medically with antibiotics without any significant improvement and was seen in consultation by Dr. Edwyna Shell on Jan 18, 2000.  Chest x-ray at this time showed an increased fusion on the left with loculation. Patient was seen by Dr. Edwyna Shell for a possible surgical evaluation for patient to undergo left video assisted thoracoscopy, drainage of the fusion.  Risks and benefits were discussed and informed consent was obtained.  Patient was taken to the operating room on January 20, 2000 and underwent left video  assisted thoracoscopy, mini thoracotomy with drainage and empyema decortication. Patient tolerated the procedure well and returned to the recovery room in 3300 in satisfactory condition.  Patient was initially febrile.  Antibiotics were continued.  Patient showed slow, steady improvement.  From metabolic standpoint his oxygen improved.  He required a fair amount of pain medicine and was fairly slow, but overall, had no significant postoperative complications.  By June 8 he was stable and his white count was down to 15,600.  Chest x-ray showed a lot of atelectasis.  He was still on some low dose oxygen.  He was kept longer for further pulmonary toilet.  By June 9 it was Dr. Remo Lipps opinion he was doing better.  He was still on low dose O2 and it was Dr. Remo Lipps opinion that depending on how he did, he would probably be ready for discharge in the a.m. Sunday, June 10, or June 11.  Patient was mobilized and walked 300 feet with a rolling walker.  Estimated continued stable improvement.  He was followed by teaching service throughout his hospital course and they were in agreement that patient was doing well on Levaquin and would be ready for discharge in the a.m.  FINAL DISPOSITION:  Final disposition on his oxygen is pending and we will work that out in the morning prior to his discharge based on Dr. Remo Lipps evaluation.  DISCHARGE MEDICATIONS: 1.  Levaquin 500 mg for at least seven days. 2. OxyContin 10-20 mg q.12h. p.r.n. 3. Protonix 40 mg q.d. 4. Percocet 1-2 p.o. q.4h. p.r.n. for breakthrough pain.  DISCHARGE ACTIVITY:  Light to moderate, no lifting over 10 pounds, no driving, no strenuous activity.  FOLLOW-UP:  Patient will return in one week to our office at the end of this week for Dr. Edwyna Shell to see and will remove his staples at that time and schedule him for follow-up with chest x-ray.  ADMISSION LABORATORY:  Showed his amylase and lipases to be normal.  His white counts were  elevated, but overall he was stable.  As of January 27, 2000 his white count is 15.6, hemoglobin is 11, hematocrit is 32.  Platelets are 329,000. Electrolytes showed sodium 137, potassium 4.5, chloride 101, CO2 27, glucose 109, BUN 12, creatinine 0.9, calcium 8.9.  He had one elevated total bilirubin on June 3 which was 1.4.  All studies were negative.  No fungal AFB or bacterial cultures were positive.  The pathology shows a left pleural pill with inflamed granulation tissue and fibropurulent exudate.  There was also some inflamed granulation tissue.  No evidence of malignancy was identified. At this point wounds are healing nicely.  Will aim for discharge in the a.m.  CONDITION ON DISCHARGE:  Improved. DD:  01/28/00 TD:  01/28/00 Job: [id number]

## 2011-01-06 NOTE — Op Note (Signed)
NAME:  Ronald Dyer, Ronald Dyer             ACCOUNT NO.:  [id number]   MEDICAL RECORD NO.:  [id number]          PATIENT TYPE:  AMB   LOCATION:  [facility]                         [facility]ACILITY[facility]   PHYSICIAN:  Bertram Millard. Dahlstedt, M.D.DATE OF BIRTH:  02/16/1941   DATE OF PROCEDURE:  12/17/2006  DATE OF DISCHARGE:                               OPERATIVE REPORT   PREOPERATIVE DIAGNOSIS:  Elevated PSA.   POSTOPERATIVE DIAGNOSIS:  Elevated PSA with benign prostatic  hypertrophy.   SURGICAL PROCEDURES:  Transrectal ultrasound and biopsy of the prostate.   SURGEON:  Bertram Millard. Dahlstedt, M.D.   ANESTHESIA:  MAC.   COMPLICATIONS:  None.   BRIEF HISTORY:  A 70 year old male who presents at this time for  ultrasound and biopsy of the prostate.  This was impossible to do in the  office due to patient discomfort with the ultrasound probe.   He presented back in November with an elevated PSA of 4.2.  Two years  ago it was under three.  Due to the persistent elevation of the PSA, it  was recommended the patient undergo ultrasound and biopsy of the  prostate.  He presents at this time for anesthetic procedure to assist.   DESCRIPTION OF PROCEDURE:  The patient was identified in the holding  area and taken to the operating room where general monitored anesthesia  care was administered.  He was placed in the left lateral decubitus  position.  The transrectal ultrasound probe was placed in the patient's  rectum.  Prostatic volume was 52.2 mL.  There were some scattered  hypoechoic areas in the peripheral zones of both right and left  prostatic lobes.  Seminal vesicles and vas appeared normal.  Prostatic  outline was normal.  Minimal postvoid residual noted within the bladder.   Biopsies were taken x12 in the usual sextant distribution.  These were  sent as right and left prostatic biopsies, respectively.  He tolerated  the procedure well after the probe was removed.      Bertram Millard. Dahlstedt, M.D.  Electronically Signed     SMD/MEDQ  D:  12/17/2006  T:  12/17/2006  Job:  [id number]   cc:   Sigmund Hazel, M.D.  Fax: [phone]

## 2011-01-06 NOTE — Op Note (Signed)
[place]. [facility]  Patient:    Ronald Dyer, Ronald Dyer                      MRN: [id number] Adm. Date:  [id number] Attending:  Edwyna Perfect CC:         DKarle Plumber, M.D. (2)             Al Decant. Janey Greaser, M.D.                           Operative Report  PREOPERATIVE DIAGNOSIS:  Left chest empyema.  POSTOPERATIVE DIAGNOSIS:  Left chest empyema.  OPERATION PERFORMED:  Left video-assisted thoracic surgery and drainage of left chest empyema with decortication.  SURGEON:  D. Karle Plumber, M.D.  FIRST ASSISTANT:  Carlye Grippe.  ANESTHESIA:  General anesthesia.  DESCRIPTION OF PROCEDURE:  After percutaneous insertion of all monitoring lines, the patient underwent general anesthesia and a dual-lumen tube was inserted.  Patient was turned in the left lateral thoracotomy position.  He was prepped and draped in the usual sterile manner.  Two trocar sites were made in the anterior and posterior axillary lines at the seventh intercostal space, two trocars were inserted and fluid was evacuated for culture and on inserting the 30 degree scope, there was evidence that there was an acute empyema going on, with a lot of exudate and inflammatory tissue.  Using ______ ring forceps, the lung was taken down off the chest wall, stripping up thickened pleural off the medial and lateral walls up to the apex.  All debris and exudate were removed.  The costophrenic angle and the cardiophrenic angle had a lot of inflammatory exudate and this was all removed and then also, the major fissure had pockets of exudate and fluid in there and these were broken up and removed, but there was a thick peel on the left lower lobe. For this reason, a 3-cm incision was made in the fifth intercostal space and the muscle was divided with electrocautery and a small Tuffier was inserted and the peel was stripped off the left lower lobe with Russian forceps and by sharp and blunt  dissection, removing all inflammatory debris from the left lower lobe.  After this had been done, three chest tubes were inserted, two through the trocar sites and one through another stab wound, a right-angle chest tube, which was the middle chest tube, and the other two being straight chest tubes. They were tied in place with 0 silk.  One pericostal was used to close the intercostal space and the muscle was closed with 2-0 Vicryl and Ethicon skin clips.  A Marcaine block was done and the patient returned to the recovery room in stable condition. DD:  01/20/00 TD:  01/25/00 Job: [id number]

## 2011-02-09 ENCOUNTER — Other Ambulatory Visit: Payer: Self-pay | Admitting: Oncology

## 2011-02-09 ENCOUNTER — Encounter (HOSPITAL_BASED_OUTPATIENT_CLINIC_OR_DEPARTMENT_OTHER): Payer: Medicare Other | Admitting: Oncology

## 2011-02-09 DIAGNOSIS — C189 Malignant neoplasm of colon, unspecified: Secondary | ICD-10-CM

## 2011-02-09 DIAGNOSIS — D509 Iron deficiency anemia, unspecified: Secondary | ICD-10-CM

## 2011-02-09 DIAGNOSIS — C182 Malignant neoplasm of ascending colon: Secondary | ICD-10-CM

## 2011-02-09 LAB — COMPREHENSIVE METABOLIC PANEL
Alkaline Phosphatase: 55 U/L (ref 39–117)
BUN: 6 mg/dL (ref 6–23)
Glucose, Bld: 143 mg/dL — ABNORMAL HIGH (ref 70–99)
Sodium: 141 mEq/L (ref 135–145)
Total Bilirubin: 0.6 mg/dL (ref 0.3–1.2)

## 2011-02-09 LAB — CBC WITH DIFFERENTIAL/PLATELET
BASO%: 0.3 % (ref 0.0–2.0)
Basophils Absolute: 0 10*3/uL (ref 0.0–0.1)
EOS%: 1.4 % (ref 0.0–7.0)
Eosinophils Absolute: 0.1 10*3/uL (ref 0.0–0.5)
HCT: 41.5 % (ref 38.4–49.9)
HGB: 14.1 g/dL (ref 13.0–17.1)
LYMPH%: 29.6 % (ref 14.0–49.0)
MCH: 28.5 pg (ref 27.2–33.4)
MCHC: 34.1 g/dL (ref 32.0–36.0)
MCV: 83.8 fL (ref 79.3–98.0)
MONO#: 0.7 10*3/uL (ref 0.1–0.9)
MONO%: 11.3 % (ref 0.0–14.0)
NEUT#: 3.8 10*3/uL (ref 1.5–6.5)
NEUT%: 57.4 % (ref 39.0–75.0)
Platelets: 146 10*3/uL (ref 140–400)
RBC: 4.95 10*6/uL (ref 4.20–5.82)
RDW: 14.8 % — ABNORMAL HIGH (ref 11.0–14.6)
WBC: 6.6 10*3/uL (ref 4.0–10.3)
lymph#: 2 10*3/uL (ref 0.9–3.3)

## 2011-02-09 LAB — CEA: CEA: 3.8 ng/mL (ref 0.0–5.0)

## 2011-06-20 ENCOUNTER — Other Ambulatory Visit: Payer: Self-pay | Admitting: Oncology

## 2011-06-20 ENCOUNTER — Ambulatory Visit (HOSPITAL_COMMUNITY)
Admission: RE | Admit: 2011-06-20 | Discharge: 2011-06-20 | Disposition: A | Discharge status: Home / Self Care | Payer: Medicare Other | Source: Ambulatory Visit | Attending: Oncology | Admitting: Oncology

## 2011-06-20 ENCOUNTER — Encounter (HOSPITAL_BASED_OUTPATIENT_CLINIC_OR_DEPARTMENT_OTHER): Payer: Medicare Other | Admitting: Oncology

## 2011-06-20 DIAGNOSIS — C189 Malignant neoplasm of colon, unspecified: Secondary | ICD-10-CM

## 2011-06-20 DIAGNOSIS — N400 Enlarged prostate without lower urinary tract symptoms: Secondary | ICD-10-CM | POA: Insufficient documentation

## 2011-06-20 DIAGNOSIS — C3490 Malignant neoplasm of unspecified part of unspecified bronchus or lung: Secondary | ICD-10-CM | POA: Insufficient documentation

## 2011-06-20 DIAGNOSIS — Z09 Encounter for follow-up examination after completed treatment for conditions other than malignant neoplasm: Secondary | ICD-10-CM | POA: Insufficient documentation

## 2011-06-20 LAB — CBC WITH DIFFERENTIAL/PLATELET
Basophils Absolute: 0 10*3/uL (ref 0.0–0.1)
EOS%: 0.8 % (ref 0.0–7.0)
Eosinophils Absolute: 0.1 10*3/uL (ref 0.0–0.5)
HCT: 41.7 % (ref 38.4–49.9)
HGB: 14.5 g/dL (ref 13.0–17.1)
MCH: 28 pg (ref 27.2–33.4)
MCV: 80.5 fL (ref 79.3–98.0)
MONO%: 10.7 % (ref 0.0–14.0)
NEUT#: 3.5 10*3/uL (ref 1.5–6.5)
NEUT%: 55.1 % (ref 39.0–75.0)
Platelets: 160 10*3/uL (ref 140–400)

## 2011-06-20 LAB — CMP (CANCER CENTER ONLY)
ALT(SGPT): 23 U/L (ref 10–47)
AST: 23 U/L (ref 11–38)
Albumin: 3.7 g/dL (ref 3.3–5.5)
Alkaline Phosphatase: 52 U/L (ref 26–84)
BUN, Bld: 8 mg/dL (ref 7–22)
CO2: 29 meq/L (ref 18–33)
Calcium: 9.6 mg/dL (ref 8.0–10.3)
Chloride: 100 meq/L (ref 98–108)
Creat: 0.9 mg/dL (ref 0.6–1.2)
Glucose, Bld: 138 mg/dL — ABNORMAL HIGH (ref 73–118)
Potassium: 4 meq/L (ref 3.3–4.7)
Sodium: 141 meq/L (ref 128–145)
Total Bilirubin: 0.6 mg/dL (ref 0.20–1.60)
Total Protein: 7.4 g/dL (ref 6.4–8.1)

## 2011-06-20 LAB — CEA: CEA: 4.4 ng/mL (ref 0.0–5.0)

## 2011-06-20 MED ORDER — IOHEXOL 300 MG/ML  SOLN
100.0000 mL | Freq: Once | INTRAMUSCULAR | Status: AC | PRN
  Administered 2011-06-20: 100 mL via INTRAVENOUS

## 2011-06-22 ENCOUNTER — Encounter (HOSPITAL_BASED_OUTPATIENT_CLINIC_OR_DEPARTMENT_OTHER): Payer: Medicare Other | Admitting: Oncology

## 2011-06-22 DIAGNOSIS — I10 Essential (primary) hypertension: Secondary | ICD-10-CM

## 2011-06-22 DIAGNOSIS — C182 Malignant neoplasm of ascending colon: Secondary | ICD-10-CM

## 2011-12-19 ENCOUNTER — Telehealth: Payer: Self-pay | Admitting: Oncology

## 2011-12-19 ENCOUNTER — Other Ambulatory Visit (HOSPITAL_BASED_OUTPATIENT_CLINIC_OR_DEPARTMENT_OTHER): Payer: Medicare Other | Admitting: Lab

## 2011-12-19 ENCOUNTER — Ambulatory Visit (HOSPITAL_BASED_OUTPATIENT_CLINIC_OR_DEPARTMENT_OTHER): Payer: Medicare Other | Admitting: Oncology

## 2011-12-19 VITALS — BP 141/86 | HR 76 | Temp 97.3°F | Ht 71.0 in | Wt 212.0 lb

## 2011-12-19 DIAGNOSIS — C182 Malignant neoplasm of ascending colon: Secondary | ICD-10-CM

## 2011-12-19 DIAGNOSIS — C189 Malignant neoplasm of colon, unspecified: Secondary | ICD-10-CM

## 2011-12-19 DIAGNOSIS — D509 Iron deficiency anemia, unspecified: Secondary | ICD-10-CM

## 2011-12-19 LAB — CBC WITH DIFFERENTIAL/PLATELET
BASO%: 0.3 % (ref 0.0–2.0)
LYMPH%: 30.6 % (ref 14.0–49.0)
MCHC: 33 g/dL (ref 32.0–36.0)
MONO#: 0.6 10*3/uL (ref 0.1–0.9)
Platelets: 169 10*3/uL (ref 140–400)
RBC: 5.1 10*6/uL (ref 4.20–5.82)
RDW: 14.8 % — ABNORMAL HIGH (ref 11.0–14.6)
WBC: 6.7 10*3/uL (ref 4.0–10.3)
lymph#: 2 10*3/uL (ref 0.9–3.3)
nRBC: 0 % (ref 0–0)

## 2011-12-19 LAB — COMPREHENSIVE METABOLIC PANEL
ALT: 15 U/L (ref 0–53)
AST: 19 U/L (ref 0–37)
Calcium: 9.9 mg/dL (ref 8.4–10.5)
Chloride: 107 mEq/L (ref 96–112)
Creatinine, Ser: 1.04 mg/dL (ref 0.50–1.35)

## 2011-12-19 NOTE — Progress Notes (Signed)
Hematology and Oncology Follow Up Visit  Ronald Dyer [id number] 31-Aug-1940 71 y.o. 12/19/2011 10:52 AM  CC: Ardeth Sportsman, MD  Sigmund Hazel, M.D.  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  Prior Therapy:  1. Status post laparoscopic hemicolectomy, pathology revealing T3 N0 disease with 0/22 lymph nodes involved.  He did not have any margins involved.  No lymphovascular invasion.  He declined adjuvant chemotherapy. 2. Status post colonoscopy done in July of 2011 without any evidence of malignancy.  Current therapy: Observation and follow up.   Interim History:  Ronald Dyer presents today for a followup visit.  He has continued to do very well without any evidence to suggest recurrent disease.  He had not reported any abdominal pain.  He had not reported any hematochezia.  He had not reported any melena.  His appetite has been excellent.  His performance status and activity level are overall unchanged at the time being.  He had not reported any hematochezia.  He had not reported any melena. No illness or hospitalizations since his last visit.    Medications: I have reviewed the patient's current medications. Current outpatient prescriptions:aspirin 81 MG tablet, Take 81 mg by mouth daily., Disp: , Rfl: ;  Multiple Vitamins-Minerals (MULTIVITAMIN WITH MINERALS) tablet, Take 1 tablet by mouth daily., Disp: , Rfl: ;  pioglitazone (ACTOS) 45 MG tablet, Take 45 mg by mouth daily., Disp: , Rfl: ;  vitamin B-12 (CYANOCOBALAMIN) 100 MCG tablet, Take 50 mcg by mouth daily., Disp: , Rfl: ;  glimepiride (AMARYL) 4 MG tablet, 4 mg Daily., Disp: , Rfl:  metFORMIN (GLUCOPHAGE) 1000 MG tablet, 1,000 mg. 1 1/2 table am and 1 tablet @@ bedtime, Disp: , Rfl: ;  simvastatin (ZOCOR) 80 MG tablet, Take 80 mg by mouth. Every evening, Disp: , Rfl:   Allergies: Allergies not on file  Past Medical History, Surgical  history, Social history, and Family History were reviewed and updated.  Review of Systems: Constitutional:  Negative for fever, chills, night sweats, anorexia, weight loss, pain. Cardiovascular: no chest pain or dyspnea on exertion Respiratory: negative Neurological: negative Dermatological: negative ENT: negative Skin: Negative. Gastrointestinal: negative Genito-Urinary: negative Hematological and Lymphatic: negative Breast: negative Musculoskeletal: negative Remaining ROS negative. Physical Exam: Blood pressure 141/86, pulse 76, temperature 97.3 F (36.3 C), temperature source Oral, height 5\' 11"  (1.803 m), weight 212 lb (96.163 kg). ECOG: 1 General appearance: alert Head: Normocephalic, without obvious abnormality, atraumatic Neck: no adenopathy, no carotid bruit, no JVD, supple, symmetrical, trachea midline and thyroid not enlarged, symmetric, no tenderness/mass/nodules Lymph nodes: Cervical, supraclavicular, and axillary nodes normal. Heart:regular rate and rhythm, S1, S2 normal, no murmur, click, rub or gallop Lung:chest clear, no wheezing, rales, normal symmetric air entry Abdomin: soft, non-tender, without masses or organomegaly EXT:no erythema, induration, or nodules   Lab Results: Lab Results  Component Value Date   WBC 6.7 12/19/2011   HGB 14.3 12/19/2011   HCT 43.3 12/19/2011   MCV 84.9 12/19/2011   PLT 169 12/19/2011      Impression and Plan:  This is a pleasant 71 year old gentleman with the following issues. 1. Stage II colorectal cancer.  Presented with T3 N0.  He had excellent lymph node dissection and refused adjuvant chemotherapy.  At this point, he is over 2.5 years out from surgical resection.  We will continue active surveillance.  I will have him follow up every 6 months for  physical examination as well as liver function tests and CEA.  I will repeat images studies at that time.  2. Iron deficiency anemia.  That has resolved. 3. Hypertension.  This is  managed by his primary care physician.    [facility], MD 4/30/201310:52 AM

## 2011-12-19 NOTE — Telephone Encounter (Signed)
appts made and printed for pt aom °

## 2012-06-19 ENCOUNTER — Other Ambulatory Visit (HOSPITAL_BASED_OUTPATIENT_CLINIC_OR_DEPARTMENT_OTHER): Payer: Medicare Other | Admitting: Lab

## 2012-06-19 ENCOUNTER — Ambulatory Visit (HOSPITAL_COMMUNITY)
Admission: RE | Admit: 2012-06-19 | Discharge: 2012-06-19 | Disposition: A | Discharge status: Home / Self Care | Payer: Medicare Other | Source: Ambulatory Visit | Attending: Oncology | Admitting: Oncology

## 2012-06-19 DIAGNOSIS — K8020 Calculus of gallbladder without cholecystitis without obstruction: Secondary | ICD-10-CM | POA: Insufficient documentation

## 2012-06-19 DIAGNOSIS — C189 Malignant neoplasm of colon, unspecified: Secondary | ICD-10-CM | POA: Insufficient documentation

## 2012-06-19 DIAGNOSIS — N400 Enlarged prostate without lower urinary tract symptoms: Secondary | ICD-10-CM | POA: Insufficient documentation

## 2012-06-19 LAB — CBC WITH DIFFERENTIAL/PLATELET
Basophils Absolute: 0 10*3/uL (ref 0.0–0.1)
EOS%: 1.2 % (ref 0.0–7.0)
HCT: 43.4 % (ref 38.4–49.9)
HGB: 14.5 g/dL (ref 13.0–17.1)
MCH: 28.5 pg (ref 27.2–33.4)
MCV: 85 fL (ref 79.3–98.0)
MONO%: 10.7 % (ref 0.0–14.0)
NEUT%: 60.8 % (ref 39.0–75.0)

## 2012-06-19 LAB — COMPREHENSIVE METABOLIC PANEL (CC13)
Alkaline Phosphatase: 58 U/L (ref 40–150)
BUN: 8 mg/dL (ref 7.0–26.0)
Glucose: 135 mg/dl — ABNORMAL HIGH (ref 70–99)
Total Bilirubin: 0.57 mg/dL (ref 0.20–1.20)

## 2012-06-19 MED ORDER — IOHEXOL 300 MG/ML  SOLN
100.0000 mL | Freq: Once | INTRAMUSCULAR | Status: AC | PRN
  Administered 2012-06-19: 100 mL via INTRAVENOUS

## 2012-06-26 ENCOUNTER — Ambulatory Visit (HOSPITAL_BASED_OUTPATIENT_CLINIC_OR_DEPARTMENT_OTHER): Payer: Medicare Other | Admitting: Oncology

## 2012-06-26 ENCOUNTER — Telehealth: Payer: Self-pay | Admitting: Oncology

## 2012-06-26 VITALS — BP 138/73 | HR 74 | Temp 97.8°F | Resp 20 | Ht 71.0 in | Wt 215.1 lb

## 2012-06-26 DIAGNOSIS — C19 Malignant neoplasm of rectosigmoid junction: Secondary | ICD-10-CM

## 2012-06-26 DIAGNOSIS — C189 Malignant neoplasm of colon, unspecified: Secondary | ICD-10-CM

## 2012-06-26 DIAGNOSIS — I10 Essential (primary) hypertension: Secondary | ICD-10-CM

## 2012-06-26 NOTE — Progress Notes (Signed)
Hematology and Oncology Follow Up Visit  Ronald Dyer [id number] [birth year] 71 y.o. 06/26/2012 10:47 AM  CC: Ardeth Sportsman, MD  Sigmund Hazel, M.D.  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  Prior Therapy:  1. Status post laparoscopic hemicolectomy, pathology revealing T3 N0 disease with 0/22 lymph nodes involved.  He did not have any margins involved.  No lymphovascular invasion.  He declined adjuvant chemotherapy. 2. Status post colonoscopy done in July of 2011 without any evidence of malignancy.  Current therapy: Observation and follow up.   Interim History:  Ronald Dyer presents today for a followup visit.  He has continued to do very well without any evidence to suggest recurrent disease.  He had not reported any abdominal pain.  He had not reported any hematochezia.  He had not reported any melena.  His appetite has been excellent.  His performance status and activity level are overall unchanged at the time being.  He had not reported any hematochezia.  He had not reported any melena. No illness or hospitalizations since his last visit.  No new GI symptoms   Medications: I have reviewed the patient's current medications. Current outpatient prescriptions:aspirin 81 MG tablet, Take 81 mg by mouth daily., Disp: , Rfl: ;  glimepiride (AMARYL) 4 MG tablet, 4 mg Daily., Disp: , Rfl: ;  metFORMIN (GLUCOPHAGE) 1000 MG tablet, 1,000 mg. 1 1/2 table am and 1 tablet @@ bedtime, Disp: , Rfl: ;  Multiple Vitamins-Minerals (MULTIVITAMIN WITH MINERALS) tablet, Take 1 tablet by mouth daily., Disp: , Rfl:  pioglitazone (ACTOS) 45 MG tablet, Take 45 mg by mouth daily., Disp: , Rfl: ;  simvastatin (ZOCOR) 80 MG tablet, Take 80 mg by mouth. Every evening, Disp: , Rfl: ;  vitamin B-12 (CYANOCOBALAMIN) 100 MCG tablet, Take 50 mcg by mouth daily., Disp: , Rfl:   Allergies: No Known Allergies  Past Medical  History, Surgical history, Social history, and Family History were reviewed and updated.  Review of Systems: Constitutional:  Negative for fever, chills, night sweats, anorexia, weight loss, pain. Cardiovascular: no chest pain or dyspnea on exertion Respiratory: negative Neurological: negative Dermatological: negative ENT: negative Skin: Negative. Gastrointestinal: negative Genito-Urinary: negative Hematological and Lymphatic: negative Breast: negative Musculoskeletal: negative Remaining ROS negative. Physical Exam: Blood pressure 138/73, pulse 74, temperature 97.8 F (36.6 C), temperature source Oral, resp. rate 20, height 5\' 11"  (1.803 m), weight 215 lb 1.6 oz (97.569 kg). ECOG: 1 General appearance: alert Head: Normocephalic, without obvious abnormality, atraumatic Neck: no adenopathy, no carotid bruit, no JVD, supple, symmetrical, trachea midline and thyroid not enlarged, symmetric, no tenderness/mass/nodules Lymph nodes: Cervical, supraclavicular, and axillary nodes normal. Heart:regular rate and rhythm, S1, S2 normal, no murmur, click, rub or gallop Lung:chest clear, no wheezing, rales, normal symmetric air entry Abdomin: soft, non-tender, without masses or organomegaly EXT:no erythema, induration, or nodules   Lab Results: Lab Results  Component Value Date   WBC 6.8 06/19/2012   HGB 14.5 06/19/2012   HCT 43.4 06/19/2012   MCV 85.0 06/19/2012   PLT 148 06/19/2012   Results for Ronald Dyer (MRN [id number]) as of 06/26/2012 10:33  Ref. Range 06/19/2012 09:43  CEA Latest Range: 0.0-5.0 ng/mL 3.7   CT CHEST, ABDOMEN AND PELVIS WITH CONTRAST  Technique: Multidetector CT imaging of the chest, abdomen and  pelvis was performed following the standard protocol during bolus  administration of intravenous contrast.  Contrast:  OMNIPAQUE IOHEXOL 300 MG/ML SOLN  Comparison: CT 06/20/2011  CT CHEST  Findings: No axillary or supraclavicular lymphadenopathy. No    mediastinal or hilar lymphadenopathy. No pericardial fluid.  Coronary calcifications are present. Esophagus normal.  Review of the lung parenchyma demonstrates bibasilar linear  scarring and atelectasis which is not changed compared to prior.  No new or suspicious pulmonary nodules. Airways are normal.  IMPRESSION:  1. No evidence of thoracic metastasis.  2. Stable bibasilar atelectasis and pleural parenchymal thickening.  CT ABDOMEN AND PELVIS  Findings: No focal hepatic lesion. There multiple gallstones  layering within the gallbladder. The pancreas, spleen, adrenal  glands, and kidneys are normal.  The stomach, small bowel, and colon are unchanged. There is an  anastomoses in the right colon without obstruction or nodularity.  Abdominal aorta is normal caliber. No retroperitoneal or  periportal lymphadenopathy.  No free fluid the pelvis. Prostate gland is enlarged. No pelvic  lymphadenopathy. Review of bone windows demonstrates no aggressive  osseous lesions. There is a well-circumscribed lucent lesion in  the right iliac bone which is stable and appears benign. The  IMPRESSION:  1. No evidence of local colon cancer recurrence or metastasis  within the abdomen pelvis.  2. Cholelithiasis.    Impression and Plan:  This is a pleasant 71 year old gentleman with the following issues. 1. Stage II colorectal cancer.  Presented with T3 N0.  He had excellent lymph node dissection and refused adjuvant chemotherapy. CT scans discussed today and did not show any disease. At this point, he is over 3 years out from surgical resection.  We will continue active surveillance.  I will have him follow up every 6 months for physical examination as well as liver function tests and CEA.  I will repeat images studies in 12 months.  2. Iron deficiency anemia.  That has resolved. 3. Hypertension.  This is managed by his primary care physician.    Ronald Dales, MD 11/6/201310:47 AM

## 2012-06-26 NOTE — Telephone Encounter (Signed)
appts made and printed for pt aom °

## 2012-12-24 ENCOUNTER — Other Ambulatory Visit (HOSPITAL_BASED_OUTPATIENT_CLINIC_OR_DEPARTMENT_OTHER): Payer: Medicare Other | Admitting: Lab

## 2012-12-24 ENCOUNTER — Telehealth: Payer: Self-pay | Admitting: Oncology

## 2012-12-24 ENCOUNTER — Ambulatory Visit (HOSPITAL_BASED_OUTPATIENT_CLINIC_OR_DEPARTMENT_OTHER): Payer: Medicare Other | Admitting: Oncology

## 2012-12-24 VITALS — BP 151/66 | HR 88 | Temp 97.5°F | Resp 20 | Ht 71.0 in | Wt 214.1 lb

## 2012-12-24 DIAGNOSIS — C189 Malignant neoplasm of colon, unspecified: Secondary | ICD-10-CM

## 2012-12-24 LAB — COMPREHENSIVE METABOLIC PANEL (CC13)
Albumin: 3.5 g/dL (ref 3.5–5.0)
Alkaline Phosphatase: 59 U/L (ref 40–150)
BUN: 7.4 mg/dL (ref 7.0–26.0)
CO2: 26 mEq/L (ref 22–29)
Glucose: 107 mg/dl — ABNORMAL HIGH (ref 70–99)
Potassium: 3.8 mEq/L (ref 3.5–5.1)

## 2012-12-24 LAB — CBC WITH DIFFERENTIAL/PLATELET
Basophils Absolute: 0 10*3/uL (ref 0.0–0.1)
Eosinophils Absolute: 0.1 10*3/uL (ref 0.0–0.5)
HGB: 13.6 g/dL (ref 13.0–17.1)
LYMPH%: 28.9 % (ref 14.0–49.0)
MCV: 82.9 fL (ref 79.3–98.0)
MONO%: 10.8 % (ref 0.0–14.0)
NEUT#: 4 10*3/uL (ref 1.5–6.5)
Platelets: 152 10*3/uL (ref 140–400)
RDW: 14.1 % (ref 11.0–14.6)

## 2012-12-24 LAB — CEA: CEA: 4.4 ng/mL (ref 0.0–5.0)

## 2012-12-24 NOTE — Progress Notes (Signed)
Hematology and Oncology Follow Up Visit  RAJI GLINSKI [id number] September 08, 1940 72 y.o. 12/24/2012 10:37 AM  CC: Ardeth Sportsman, MD  Sigmund Hazel, M.D.  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  Prior Therapy:  1. Status post laparoscopic hemicolectomy, pathology revealing T3 N0 disease with 0/22 lymph nodes involved.  He did not have any margins involved.  No lymphovascular invasion.  He declined adjuvant chemotherapy. 2. Status post colonoscopy done in July of 2011 without any evidence of malignancy.  Current therapy: Observation and follow up.   Interim History:  Mr. Sires presents today for a followup visit.  He has continued to do very well without any evidence to suggest recurrent disease.  He had not reported any abdominal pain.  He had not reported any hematochezia.  He had not reported any melena.  His appetite has been excellent.  His performance status and activity level are overall unchanged at the time being.  He had not reported any hematochezia.  He had not reported any melena. No illness or hospitalizations since his last visit. He is due for colonoscopy this year.     Medications: I have reviewed the patient's current medications. Current outpatient prescriptions:aspirin 81 MG tablet, Take 81 mg by mouth daily., Disp: , Rfl: ;  glimepiride (AMARYL) 4 MG tablet, 4 mg Daily., Disp: , Rfl: ;  metFORMIN (GLUCOPHAGE) 1000 MG tablet, 1,000 mg. 1 1/2 table am and 1 tablet @@ bedtime, Disp: , Rfl: ;  Multiple Vitamins-Minerals (MULTIVITAMIN WITH MINERALS) tablet, Take 1 tablet by mouth daily., Disp: , Rfl:  pioglitazone (ACTOS) 45 MG tablet, Take 45 mg by mouth daily., Disp: , Rfl: ;  simvastatin (ZOCOR) 80 MG tablet, Take 80 mg by mouth. Every evening, Disp: , Rfl: ;  vitamin B-12 (CYANOCOBALAMIN) 100 MCG tablet, Take 50 mcg by mouth daily., Disp: , Rfl:   Allergies: No Known  Allergies  Past Medical History, Surgical history, Social history, and Family History were reviewed and updated.  Review of Systems: Constitutional:  Negative for fever, chills, night sweats, anorexia, weight loss, pain. Cardiovascular: no chest pain or dyspnea on exertion Respiratory: negative Neurological: negative Dermatological: negative ENT: negative Skin: Negative. Gastrointestinal: negative Genito-Urinary: negative Hematological and Lymphatic: negative Breast: negative Musculoskeletal: negative Remaining ROS negative. Physical Exam: Blood pressure 151/66, pulse 88, temperature 97.5 F (36.4 C), temperature source Oral, resp. rate 20, height 5\' 11"  (1.803 m), weight 214 lb 1.6 oz (97.115 kg), SpO2 97.00%. ECOG: 1 General appearance: alert Head: Normocephalic, without obvious abnormality, atraumatic Neck: no adenopathy, no carotid bruit, no JVD, supple, symmetrical, trachea midline and thyroid not enlarged, symmetric, no tenderness/mass/nodules Lymph nodes: Cervical, supraclavicular, and axillary nodes normal. Heart:regular rate and rhythm, S1, S2 normal, no murmur, click, rub or gallop Lung:chest clear, no wheezing, rales, normal symmetric air entry Abdomin: soft, non-tender, without masses or organomegaly EXT:no erythema, induration, or nodules   Lab Results: Lab Results  Component Value Date   WBC 6.9 12/24/2012   HGB 13.6 12/24/2012   HCT 40.6 12/24/2012   MCV 82.9 12/24/2012   PLT 152 12/24/2012      Impression and Plan:  This is a pleasant 72 year old gentleman with the following issues. 1. Stage II colorectal cancer.  Presented with T3 N0.  He had excellent lymph node dissection and refused adjuvant chemotherapy. CT scans in 06/2012 did not show any disease. At this point, he is close to  4 years out from surgical resection.  We will continue active surveillance.  I will have him follow up in 6 months for physical examination as well as liver function tests and CEA as  well as CT scan.  2. Iron deficiency anemia.  That has resolved. 3. Hypertension.  This is managed by his primary care physician. 4. Colonoscopy screening. Last one done in in 2011. He will be due this year.     [facility], MD 5/6/201410:37 AM

## 2013-01-10 ENCOUNTER — Telehealth: Payer: Self-pay | Admitting: Dietician

## 2013-05-28 ENCOUNTER — Other Ambulatory Visit: Payer: Self-pay | Admitting: Gastroenterology

## 2013-06-09 ENCOUNTER — Other Ambulatory Visit: Payer: Self-pay | Admitting: Family Medicine

## 2013-06-09 DIAGNOSIS — Z136 Encounter for screening for cardiovascular disorders: Secondary | ICD-10-CM

## 2013-06-11 ENCOUNTER — Ambulatory Visit
Admission: RE | Admit: 2013-06-11 | Discharge: 2013-06-11 | Disposition: A | Discharge status: Home / Self Care | Payer: Medicare Other | Source: Ambulatory Visit | Attending: Family Medicine | Admitting: Family Medicine

## 2013-06-11 DIAGNOSIS — Z136 Encounter for screening for cardiovascular disorders: Secondary | ICD-10-CM

## 2013-06-26 ENCOUNTER — Other Ambulatory Visit: Payer: Medicare Other

## 2013-06-26 ENCOUNTER — Other Ambulatory Visit (HOSPITAL_COMMUNITY): Payer: Medicare Other

## 2013-06-26 ENCOUNTER — Encounter: Payer: Self-pay | Admitting: Oncology

## 2013-06-26 NOTE — Progress Notes (Signed)
Ct chest auth # [id number] 06/26/13-08/10/13 ap [id number] 06/26/13-08/10/13.

## 2013-06-27 ENCOUNTER — Other Ambulatory Visit (HOSPITAL_BASED_OUTPATIENT_CLINIC_OR_DEPARTMENT_OTHER): Payer: Medicare Other | Admitting: Lab

## 2013-06-27 ENCOUNTER — Ambulatory Visit (HOSPITAL_COMMUNITY)
Admission: RE | Admit: 2013-06-27 | Discharge: 2013-06-27 | Disposition: A | Discharge status: Home / Self Care | Payer: Medicare Other | Source: Ambulatory Visit | Attending: Oncology | Admitting: Oncology

## 2013-06-27 DIAGNOSIS — C189 Malignant neoplasm of colon, unspecified: Secondary | ICD-10-CM

## 2013-06-27 DIAGNOSIS — N320 Bladder-neck obstruction: Secondary | ICD-10-CM | POA: Insufficient documentation

## 2013-06-27 DIAGNOSIS — N401 Enlarged prostate with lower urinary tract symptoms: Secondary | ICD-10-CM | POA: Insufficient documentation

## 2013-06-27 DIAGNOSIS — C182 Malignant neoplasm of ascending colon: Secondary | ICD-10-CM

## 2013-06-27 DIAGNOSIS — N138 Other obstructive and reflux uropathy: Secondary | ICD-10-CM | POA: Insufficient documentation

## 2013-06-27 DIAGNOSIS — K7689 Other specified diseases of liver: Secondary | ICD-10-CM | POA: Insufficient documentation

## 2013-06-27 DIAGNOSIS — K8020 Calculus of gallbladder without cholecystitis without obstruction: Secondary | ICD-10-CM | POA: Insufficient documentation

## 2013-06-27 LAB — COMPREHENSIVE METABOLIC PANEL (CC13)
ALT: 23 U/L (ref 0–55)
Anion Gap: 11 mEq/L (ref 3–11)
BUN: 6.2 mg/dL — ABNORMAL LOW (ref 7.0–26.0)
CO2: 24 mEq/L (ref 22–29)
Calcium: 10.2 mg/dL (ref 8.4–10.4)
Chloride: 108 mEq/L (ref 98–109)
Creatinine: 0.9 mg/dL (ref 0.7–1.3)
Sodium: 144 mEq/L (ref 136–145)
Total Bilirubin: 0.65 mg/dL (ref 0.20–1.20)
Total Protein: 7.2 g/dL (ref 6.4–8.3)

## 2013-06-27 LAB — CBC WITH DIFFERENTIAL/PLATELET
BASO%: 0.6 % (ref 0.0–2.0)
HCT: 43.6 % (ref 38.4–49.9)
LYMPH%: 30.5 % (ref 14.0–49.0)
MCH: 28.3 pg (ref 27.2–33.4)
MCHC: 33.6 g/dL (ref 32.0–36.0)
MCV: 84.2 fL (ref 79.3–98.0)
MONO#: 0.7 10*3/uL (ref 0.1–0.9)
MONO%: 10.6 % (ref 0.0–14.0)
NEUT%: 57 % (ref 39.0–75.0)
Platelets: 155 10*3/uL (ref 140–400)
RBC: 5.18 10*6/uL (ref 4.20–5.82)
WBC: 6.6 10*3/uL (ref 4.0–10.3)
lymph#: 2 10*3/uL (ref 0.9–3.3)

## 2013-06-27 MED ORDER — IOHEXOL 300 MG/ML  SOLN
100.0000 mL | Freq: Once | INTRAMUSCULAR | Status: AC | PRN
  Administered 2013-06-27: 100 mL via INTRAVENOUS

## 2013-06-27 MED ORDER — IOHEXOL 300 MG/ML  SOLN
50.0000 mL | Freq: Once | INTRAMUSCULAR | Status: AC | PRN
  Administered 2013-06-27: 50 mL via ORAL

## 2013-06-28 LAB — CEA: CEA: 4.6 ng/mL (ref 0.0–5.0)

## 2013-07-01 ENCOUNTER — Ambulatory Visit (HOSPITAL_BASED_OUTPATIENT_CLINIC_OR_DEPARTMENT_OTHER): Payer: Medicare Other | Admitting: Oncology

## 2013-07-01 ENCOUNTER — Telehealth: Payer: Self-pay | Admitting: Oncology

## 2013-07-01 ENCOUNTER — Encounter (INDEPENDENT_AMBULATORY_CARE_PROVIDER_SITE_OTHER): Payer: Self-pay

## 2013-07-01 VITALS — BP 156/82 | HR 70 | Temp 97.7°F | Resp 18 | Ht 71.0 in | Wt 212.2 lb

## 2013-07-01 DIAGNOSIS — C189 Malignant neoplasm of colon, unspecified: Secondary | ICD-10-CM

## 2013-07-01 DIAGNOSIS — C182 Malignant neoplasm of ascending colon: Secondary | ICD-10-CM

## 2013-07-01 NOTE — Progress Notes (Signed)
Hematology and Oncology Follow Up Visit  Ronald Dyer [id number] 12-29-1940 72 y.o. 07/01/2013 10:33 AM  CC: Ronald Sportsman, MD  Ronald Dyer, M.D.  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  Prior Therapy:  1. Status post laparoscopic hemicolectomy, pathology revealing T3 N0 disease with 0/22 lymph nodes involved.  He did not have any margins involved.  No lymphovascular invasion.  He declined adjuvant chemotherapy. 2. Status post colonoscopy done in July of 2011 without any evidence of malignancy.  Current therapy: Observation and follow up.   Interim History:  Ronald Dyer presents today for a followup visit with his wife.  He has continued to do very well without any evidence to suggest recurrent disease.  He had not reported any abdominal pain.  He had not reported any hematochezia.  He had not reported any melena.  His appetite has been excellent.  His performance status and activity level are overall unchanged at the time being.  He had not reported any hematochezia.  He had not reported any melena. No illness or hospitalizations since his last visit. He had a colonoscopy already this year.    Medications: I have reviewed the patient's current medications.   Current Outpatient Prescriptions  Medication Sig Dispense Refill  . aspirin 81 MG tablet Take 81 mg by mouth daily.      Marland Kitchen GAVILYTE-N WITH FLAVOR PACK 420 G solution Take 420 g by mouth daily.      Marland Kitchen glimepiride (AMARYL) 4 MG tablet 4 mg Daily.      . metFORMIN (GLUCOPHAGE) 1000 MG tablet 1,000 mg. 1 1/2 table am and 1 tablet @@ bedtime      . Multiple Vitamins-Minerals (MULTIVITAMIN WITH MINERALS) tablet Take 1 tablet by mouth daily.      . pioglitazone (ACTOS) 45 MG tablet Take 45 mg by mouth daily.      . simvastatin (ZOCOR) 80 MG tablet Take 80 mg by mouth. Every evening      . vitamin B-12 (CYANOCOBALAMIN) 100 MCG tablet  Take 50 mcg by mouth daily.       No current facility-administered medications for this visit.    Allergies: No Known Allergies  Past Medical History, Surgical history, Social history, and Family History were reviewed and updated.  Review of Systems: Constitutional:  Negative for fever, chills, night sweats, anorexia, weight loss, pain. Cardiovascular: no chest pain or dyspnea on exertion Respiratory: negative Neurological: negative Dermatological: negative ENT: negative Skin: Negative. Gastrointestinal: negative Genito-Urinary: negative Hematological and Lymphatic: negative Breast: negative Musculoskeletal: negative Remaining ROS negative. Physical Exam: Blood pressure 156/82, pulse 70, temperature 97.7 F (36.5 C), temperature source Oral, resp. rate 18, height 5\' 11"  (1.803 m), weight 212 lb 3.2 oz (96.253 kg). ECOG: 1 General appearance: alert Head: Normocephalic, without obvious abnormality, atraumatic Neck: no adenopathy, no carotid bruit, no JVD, supple, symmetrical, trachea midline and thyroid not enlarged, symmetric, no tenderness/mass/nodules Lymph nodes: Cervical, supraclavicular, and axillary nodes normal. Heart:regular rate and rhythm, S1, S2 normal, no murmur, click, rub or gallop Lung:chest clear, no wheezing, rales, normal symmetric air entry Abdomin: soft, non-tender, without masses or organomegaly EXT:no erythema, induration, or nodules   Lab Results: Lab Results  Component Value Date   WBC 6.6 06/27/2013   HGB 14.7 06/27/2013   HCT 43.6 06/27/2013   MCV 84.2 06/27/2013   PLT 155 06/27/2013    EXAM:  CT CHEST, ABDOMEN, AND  PELVIS WITH CONTRAST  TECHNIQUE:  Multidetector CT imaging of the chest, abdomen and pelvis was  performed following the standard protocol during bolus  administration of intravenous contrast.  CONTRAST: OMNIPAQUE IOHEXOL 300 MG/ML SOLN  COMPARISON: 06/19/2012  FINDINGS:  CT CHEST FINDINGS  No evidence of mediastinal or  hilar masses. No adenopathy seen  within the thorax. No evidence of pleural or pericardial effusion.  Bibasilar pleural scarring remains stable. No suspicious pulmonary  nodules or masses are identified. No evidence of pulmonary  infiltrate or central endobronchial lesion. No evidence of chest  wall mass or suspicious bone lesions.  CT ABDOMEN AND PELVIS FINDINGS  Hepatic steatosis is again noted, however no liver masses are  identified. Cholelithiasis is again demonstrated, without evidence  of cholecystitis or biliary dilatation.  The pancreas, spleen, adrenal glands, and kidneys are normal in  appearance. No evidence of hydronephrosis.  No soft tissue masses or lymphadenopathy identified within the  abdomen or pelvis. Moderate to severe enlargement of the prostate  gland is again seen with mass effect on the bladder base. Mild  diffuse bladder wall thickening is stable and consistent with  chronic bladder outlet obstruction. Previous right colectomy again  demonstrated. No evidence of bowel wall thickening, dilatation, or  hernia. No suspicious bone lesions identified.  IMPRESSION:  No evidence of recurrent or metastatic carcinoma within the chest,  abdomen, or pelvis.  Stable cholelithiasis and hepatic steatosis.  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   Impression and Plan:  This is a pleasant 72 year old gentleman with the following issues. 1. Stage II colorectal cancer.  Presented with T3 N0.  He had excellent lymph node dissection and refused adjuvant chemotherapy. CT scans in 06/2013 were discussed today and did not show any disease. At this point, he is over 4 years out from surgical resection.  We will continue active surveillance.  I will repeat imaging studies as well as a followup visit in 12 months.  2. Iron deficiency anemia.  That has resolved. 3. Hypertension.  This is managed by his primary care physician. 4. Colonoscopy  screening. Last one done in in 2014.    [name], MD 11/11/201410:33 AM

## 2013-07-01 NOTE — Telephone Encounter (Signed)
worked 07/01/13 pof scheduling appts Adv pt CT will call w appt for next year AVS and cal  given to pt shh

## 2014-03-25 ENCOUNTER — Ambulatory Visit (INDEPENDENT_AMBULATORY_CARE_PROVIDER_SITE_OTHER): Payer: Commercial Managed Care - HMO | Admitting: Family Medicine

## 2014-03-25 VITALS — BP 184/81 | HR 66 | Temp 97.9°F | Resp 16 | Ht 70.0 in | Wt 214.6 lb

## 2014-03-25 DIAGNOSIS — C189 Malignant neoplasm of colon, unspecified: Secondary | ICD-10-CM

## 2014-03-25 DIAGNOSIS — I10 Essential (primary) hypertension: Secondary | ICD-10-CM

## 2014-03-25 DIAGNOSIS — R5383 Other fatigue: Principal | ICD-10-CM

## 2014-03-25 DIAGNOSIS — E118 Type 2 diabetes mellitus with unspecified complications: Secondary | ICD-10-CM

## 2014-03-25 DIAGNOSIS — R5381 Other malaise: Secondary | ICD-10-CM

## 2014-03-25 DIAGNOSIS — E1165 Type 2 diabetes mellitus with hyperglycemia: Secondary | ICD-10-CM

## 2014-03-25 DIAGNOSIS — IMO0002 Reserved for concepts with insufficient information to code with codable children: Secondary | ICD-10-CM

## 2014-03-25 LAB — POCT CBC
GRANULOCYTE PERCENT: 64.3 % (ref 37–80)
HCT, POC: 44 % (ref 43.5–53.7)
Hemoglobin: 14.2 g/dL (ref 14.1–18.1)
Lymph, poc: 1.8 (ref 0.6–3.4)
MCH, POC: 27.7 pg (ref 27–31.2)
MCHC: 32.4 g/dL (ref 31.8–35.4)
MCV: 85.4 fL (ref 80–97)
MID (CBC): 0.6 (ref 0–0.9)
MPV: 8.3 fL (ref 0–99.8)
PLATELET COUNT, POC: 145 10*3/uL (ref 142–424)
POC Granulocyte: 4.4 (ref 2–6.9)
POC LYMPH PERCENT: 26.6 %L (ref 10–50)
POC MID %: 9.1 % (ref 0–12)
RBC: 5.15 M/uL (ref 4.69–6.13)
RDW, POC: 14.3 %
WBC: 6.9 10*3/uL (ref 4.6–10.2)

## 2014-03-25 LAB — GLUCOSE, POCT (MANUAL RESULT ENTRY): POC GLUCOSE: 159 mg/dL — AB (ref 70–99)

## 2014-03-25 LAB — POCT GLYCOSYLATED HEMOGLOBIN (HGB A1C): HEMOGLOBIN A1C: 7

## 2014-03-25 MED ORDER — LISINOPRIL 10 MG PO TABS: 10.0000 mg | ORAL_TABLET | Freq: Every day | ORAL | Status: DC

## 2014-03-25 NOTE — Progress Notes (Addendum)
[facility] [address], [place] [address] [phone]  Date:  03/25/2014   Name:  Ronald Dyer   DOB:  08-22-1940   MRN:  [id number]  PCP:  Tawanna Solo, MD    Chief Complaint: Dizziness, Insect Bite and Shortness of Breath   History of Present Illness:  Ronald Dyer is a 73 y.o. very pleasant male patient who presents with the following:  Here today as a new patient.  He has a history of DM, COPD, colon cancer.  He states he is feeling "dizzy and woozy" for about a week.  He has felt lightheaded- no vertigo.  He cannot describe his sx more specifically but just does not feel right He notes that he was stung by some sort of insect about a week ago in his yard- he is not sure if this caused his sx.  "The stinger is still in."   He does have SOB but no CP.  He does have some headache but not severe He does not take any BP medication- he does not think he has ever been dx with HTN  He does take medication for his DM, but nohting for his COPD.    His PCP is Dr. Sabra Heck with [facility]  BP at oncology 12/2012: 151/66 06/2013: 156/82  Patient Active Problem List   Diagnosis Date Noted  . DIABETES MELLITUS II, UNCOMPLICATED 00/93/8182  . DEPRESSION, MAJOR, RECURRENT 10/18/2006  . ALCOHOL ABUSE, UNSPECIFIED 10/18/2006  . COPD 10/18/2006  . GASTROESOPHAGEAL REFLUX, NO ESOPHAGITIS 10/18/2006    History reviewed. No pertinent past medical history.  History reviewed. No pertinent past surgical history.  History  Substance Use Topics  . Smoking status: Not on file  . Smokeless tobacco: Not on file  . Alcohol Use: Not on file    History reviewed. No pertinent family history.  No Known Allergies  Medication list has been reviewed and updated.  Current Outpatient Prescriptions on File Prior to Visit  Medication Sig Dispense Refill  . aspirin 81 MG tablet Take 81 mg by mouth daily.      Marland Kitchen glimepiride (AMARYL) 4 MG tablet 4 mg Daily.      .  metFORMIN (GLUCOPHAGE) 1000 MG tablet 1,000 mg. 1 1/2 table am and 1 tablet @@ bedtime      . Multiple Vitamins-Minerals (MULTIVITAMIN WITH MINERALS) tablet Take 1 tablet by mouth daily.      . pioglitazone (ACTOS) 45 MG tablet Take 45 mg by mouth daily.      . simvastatin (ZOCOR) 80 MG tablet Take 80 mg by mouth. Every evening      . vitamin B-12 (CYANOCOBALAMIN) 100 MCG tablet Take 50 mcg by mouth daily.      Marland Kitchen GAVILYTE-N WITH FLAVOR PACK 420 G solution Take 420 g by mouth daily.       No current facility-administered medications on file prior to visit.    Review of Systems:  As per HPI- otherwise negative.   Physical Examination: Filed Vitals:   03/25/14 1401  BP: 192/98  Pulse: 66  Temp: 97.9 F (36.6 C)  Resp: 16   Filed Vitals:   03/25/14 1401  Height: 5\' 10"  (1.778 m)  Weight: 214 lb 9.6 oz (97.342 kg)   Body mass index is 30.79 kg/(m^2). Ideal Body Weight: Weight in (lb) to have BMI = 25: 173.9  GEN: WDWN, NAD, Non-toxic, A & O x 3, appears well and to be stated age 44: Atraumatic, Normocephalic.  Neck supple. No masses, No LAD.  Bilateral TM wnl, oropharynx normal.  PEERL,EOMI.   Ears and Nose: No external deformity. CV: RRR, No M/G/R. No JVD. No thrill. No extra heart sounds. PULM: CTA B, no wheezes, crackles, rhonchi. No retractions. No resp. distress. No accessory muscle use. ABD: S, NT, ND, +BS. No rebound. No HSM. EXTR: No c/c/e NEURO Normal gait. Normal strength, sensation and DTR all extremities.  Normal facial movement  No arm drift, no slurred speech.  PSYCH: Normally interactive. Conversant. Not depressed or anxious appearing.  Calm demeanor.  There is a tiny wound on the back of his neck with a central scab.  He notes this as the location of the insect sting or bite.  This area is tender, but there is no evidence of acute infection.  Prepped with alcohol and removed scab with forceps.  No evidence of any retained FB or "stinger."  Results for orders  placed in visit on 03/25/14  POCT CBC      Result Value Ref Range   WBC 6.9  4.6 - 10.2 K/uL   Lymph, poc 1.8  0.6 - 3.4   POC LYMPH PERCENT 26.6  10 - 50 %L   MID (cbc) 0.6  0 - 0.9   POC MID % 9.1  0 - 12 %M   POC Granulocyte 4.4  2 - 6.9   Granulocyte percent 64.3  37 - 80 %G   RBC 5.15  4.69 - 6.13 M/uL   Hemoglobin 14.2  14.1 - 18.1 g/dL   HCT, POC 44.0  43.5 - 53.7 %   MCV 85.4  80 - 97 fL   MCH, POC 27.7  27 - 31.2 pg   MCHC 32.4  31.8 - 35.4 g/dL   RDW, POC 14.3     Platelet Count, POC 145  142 - 424 K/uL   MPV 8.3  0 - 99.8 fL  GLUCOSE, POCT (MANUAL RESULT ENTRY)      Result Value Ref Range   POC Glucose 159 (*) 70 - 99 mg/dl  POCT GLYCOSYLATED HEMOGLOBIN (HGB A1C)      Result Value Ref Range   Hemoglobin A1C 7.0     EKG: NSR, no ST elevation or depression  Assessment and Plan: Other malaise and fatigue - Plan: POCT CBC, Comprehensive metabolic panel  Colon cancer  Type II or unspecified type diabetes mellitus with unspecified complication, uncontrolled - Plan: POCT glucose (manual entry), POCT glycosylated hemoglobin (Hb A1C)  Essential hypertension - Plan: EKG 12-Lead, lisinopril (PRINIVIL,ZESTRIL) 10 MG tablet  Joahan is here today with sx of "not feeling right," and being lightheaded.  He related these sx to a "stinger" in his back from an insect sting a week ago.  However counseled him that I do not think this is probably the cause of his sx.  It is possible he has sustained a stroke or other more serious cause of his sx.  Our evaluation at clinic is so far normal.  Encouraged him to go to the ED for further evaluation.  However at this time he is feeling better and declines ER evalu, but does agree to seek care if he gets worse or if he is not better soon Will start lisinopril for his BP  Signed Lamar Blinks, MD  Called 8/6 to check on him.  He is feeling "much better." We faxed his OV to Dr. Sabra Heck- she is going to see him tomorrow  Results for orders  placed in visit  on 03/25/14  COMPREHENSIVE METABOLIC PANEL      Result Value Ref Range   Sodium 139  135 - 145 mEq/L   Potassium 4.0  3.5 - 5.3 mEq/L   Chloride 106  96 - 112 mEq/L   CO2 25  19 - 32 mEq/L   Glucose, Bld 146 (*) 70 - 99 mg/dL   BUN 10  6 - 23 mg/dL   Creat 0.95  0.50 - 1.35 mg/dL   Total Bilirubin 0.5  0.2 - 1.2 mg/dL   Alkaline Phosphatase 58  39 - 117 U/L   AST 22  0 - 37 U/L   ALT 20  0 - 53 U/L   Total Protein 6.9  6.0 - 8.3 g/dL   Albumin 4.3  3.5 - 5.2 g/dL   Calcium 10.3  8.4 - 10.5 mg/dL  POCT CBC      Result Value Ref Range   WBC 6.9  4.6 - 10.2 K/uL   Lymph, poc 1.8  0.6 - 3.4   POC LYMPH PERCENT 26.6  10 - 50 %L   MID (cbc) 0.6  0 - 0.9   POC MID % 9.1  0 - 12 %M   POC Granulocyte 4.4  2 - 6.9   Granulocyte percent 64.3  37 - 80 %G   RBC 5.15  4.69 - 6.13 M/uL   Hemoglobin 14.2  14.1 - 18.1 g/dL   HCT, POC 44.0  43.5 - 53.7 %   MCV 85.4  80 - 97 fL   MCH, POC 27.7  27 - 31.2 pg   MCHC 32.4  31.8 - 35.4 g/dL   RDW, POC 14.3     Platelet Count, POC 145  142 - 424 K/uL   MPV 8.3  0 - 99.8 fL  GLUCOSE, POCT (MANUAL RESULT ENTRY)      Result Value Ref Range   POC Glucose 159 (*) 70 - 99 mg/dl  POCT GLYCOSYLATED HEMOGLOBIN (HGB A1C)      Result Value Ref Range   Hemoglobin A1C 7.0

## 2014-03-25 NOTE — Patient Instructions (Signed)
We are going to start you on lisinopril 10 mg a day for your blood pressure.  Please let me know if you are not feeling better in the next couple of days- Sooner if worse.   I will be in touch with the rest of your labs asap

## 2014-03-26 ENCOUNTER — Encounter: Payer: Self-pay | Admitting: Family Medicine

## 2014-03-26 LAB — COMPREHENSIVE METABOLIC PANEL
ALBUMIN: 4.3 g/dL (ref 3.5–5.2)
ALK PHOS: 58 U/L (ref 39–117)
ALT: 20 U/L (ref 0–53)
AST: 22 U/L (ref 0–37)
BILIRUBIN TOTAL: 0.5 mg/dL (ref 0.2–1.2)
BUN: 10 mg/dL (ref 6–23)
CO2: 25 mEq/L (ref 19–32)
Calcium: 10.3 mg/dL (ref 8.4–10.5)
Chloride: 106 mEq/L (ref 96–112)
Creat: 0.95 mg/dL (ref 0.50–1.35)
Glucose, Bld: 146 mg/dL — ABNORMAL HIGH (ref 70–99)
POTASSIUM: 4 meq/L (ref 3.5–5.3)
Sodium: 139 mEq/L (ref 135–145)
TOTAL PROTEIN: 6.9 g/dL (ref 6.0–8.3)

## 2014-06-08 ENCOUNTER — Other Ambulatory Visit: Payer: Self-pay | Admitting: Family Medicine

## 2014-06-08 DIAGNOSIS — R42 Dizziness and giddiness: Secondary | ICD-10-CM

## 2014-06-10 ENCOUNTER — Ambulatory Visit
Admission: RE | Admit: 2014-06-10 | Discharge: 2014-06-10 | Disposition: A | Discharge status: Home / Self Care | Payer: Commercial Managed Care - HMO | Source: Ambulatory Visit | Attending: Family Medicine | Admitting: Family Medicine

## 2014-06-10 DIAGNOSIS — R42 Dizziness and giddiness: Secondary | ICD-10-CM

## 2014-06-11 ENCOUNTER — Other Ambulatory Visit: Payer: Medicare Other

## 2014-06-19 ENCOUNTER — Telehealth: Payer: Self-pay | Admitting: Oncology

## 2014-06-19 NOTE — Telephone Encounter (Signed)
S/w pt's wife, advised appt chg from 11/18 to 11/19 @ 1.15.

## 2014-07-01 ENCOUNTER — Ambulatory Visit (HOSPITAL_COMMUNITY)
Admission: RE | Admit: 2014-07-01 | Discharge: 2014-07-01 | Disposition: A | Discharge status: Home / Self Care | Payer: Medicare HMO | Source: Ambulatory Visit | Attending: Diagnostic Radiology | Admitting: Diagnostic Radiology

## 2014-07-01 ENCOUNTER — Other Ambulatory Visit (HOSPITAL_BASED_OUTPATIENT_CLINIC_OR_DEPARTMENT_OTHER): Payer: Commercial Managed Care - HMO

## 2014-07-01 DIAGNOSIS — C189 Malignant neoplasm of colon, unspecified: Secondary | ICD-10-CM | POA: Diagnosis not present

## 2014-07-01 DIAGNOSIS — N400 Enlarged prostate without lower urinary tract symptoms: Secondary | ICD-10-CM | POA: Insufficient documentation

## 2014-07-01 DIAGNOSIS — Z9049 Acquired absence of other specified parts of digestive tract: Secondary | ICD-10-CM | POA: Insufficient documentation

## 2014-07-01 DIAGNOSIS — K8020 Calculus of gallbladder without cholecystitis without obstruction: Secondary | ICD-10-CM | POA: Diagnosis not present

## 2014-07-01 DIAGNOSIS — K760 Fatty (change of) liver, not elsewhere classified: Secondary | ICD-10-CM | POA: Diagnosis not present

## 2014-07-01 DIAGNOSIS — C182 Malignant neoplasm of ascending colon: Secondary | ICD-10-CM

## 2014-07-01 DIAGNOSIS — I2510 Atherosclerotic heart disease of native coronary artery without angina pectoris: Secondary | ICD-10-CM | POA: Insufficient documentation

## 2014-07-01 DIAGNOSIS — R197 Diarrhea, unspecified: Secondary | ICD-10-CM | POA: Insufficient documentation

## 2014-07-01 LAB — CBC WITH DIFFERENTIAL/PLATELET
BASO%: 0.9 % (ref 0.0–2.0)
Basophils Absolute: 0.1 10*3/uL (ref 0.0–0.1)
EOS%: 0.9 % (ref 0.0–7.0)
Eosinophils Absolute: 0.1 10*3/uL (ref 0.0–0.5)
HCT: 44.1 % (ref 38.4–49.9)
HGB: 14.3 g/dL (ref 13.0–17.1)
LYMPH%: 25 % (ref 14.0–49.0)
MCH: 27.2 pg (ref 27.2–33.4)
MCHC: 32.5 g/dL (ref 32.0–36.0)
MCV: 83.8 fL (ref 79.3–98.0)
MONO#: 0.8 10*3/uL (ref 0.1–0.9)
MONO%: 11.2 % (ref 0.0–14.0)
NEUT#: 4.5 10*3/uL (ref 1.5–6.5)
NEUT%: 62 % (ref 39.0–75.0)
Platelets: 173 10*3/uL (ref 140–400)
RBC: 5.27 10*6/uL (ref 4.20–5.82)
RDW: 14.1 % (ref 11.0–14.6)
WBC: 7.3 10*3/uL (ref 4.0–10.3)
lymph#: 1.8 10*3/uL (ref 0.9–3.3)

## 2014-07-01 LAB — COMPREHENSIVE METABOLIC PANEL (CC13)
ALK PHOS: 58 U/L (ref 40–150)
ALT: 30 U/L (ref 0–55)
AST: 28 U/L (ref 5–34)
Albumin: 3.9 g/dL (ref 3.5–5.0)
Anion Gap: 6 mEq/L (ref 3–11)
BILIRUBIN TOTAL: 0.48 mg/dL (ref 0.20–1.20)
BUN: 8.4 mg/dL (ref 7.0–26.0)
CO2: 25 mEq/L (ref 22–29)
Calcium: 10.2 mg/dL (ref 8.4–10.4)
Chloride: 108 mEq/L (ref 98–109)
Creatinine: 1.1 mg/dL (ref 0.7–1.3)
Glucose: 121 mg/dl (ref 70–140)
Potassium: 4.1 mEq/L (ref 3.5–5.1)
SODIUM: 140 meq/L (ref 136–145)
TOTAL PROTEIN: 7.1 g/dL (ref 6.4–8.3)

## 2014-07-01 MED ORDER — IOHEXOL 300 MG/ML  SOLN
100.0000 mL | Freq: Once | INTRAMUSCULAR | Status: AC | PRN
  Administered 2014-07-01: 100 mL via INTRAVENOUS

## 2014-07-02 LAB — CEA: CEA: 4 ng/mL (ref 0.0–5.0)

## 2014-07-08 ENCOUNTER — Ambulatory Visit: Payer: Medicare Other | Admitting: Oncology

## 2014-07-09 ENCOUNTER — Ambulatory Visit (HOSPITAL_BASED_OUTPATIENT_CLINIC_OR_DEPARTMENT_OTHER): Payer: Commercial Managed Care - HMO | Admitting: Oncology

## 2014-07-09 VITALS — BP 158/83 | HR 63 | Temp 98.7°F | Resp 18 | Ht 70.0 in | Wt 218.8 lb

## 2014-07-09 DIAGNOSIS — Z85038 Personal history of other malignant neoplasm of large intestine: Secondary | ICD-10-CM

## 2014-07-09 DIAGNOSIS — C189 Malignant neoplasm of colon, unspecified: Secondary | ICD-10-CM

## 2014-07-09 NOTE — Progress Notes (Signed)
Hematology and Oncology Follow Up Visit  Ronald Dyer [id number] 1941-04-16 73 y.o. 07/09/2014 1:17 PM  CC: [name], MD  [name], M.D.  [name], MD    Principle Diagnosis: 73 year old gentleman with T3 N0 stage II colon cancer diagnosed in July of 2010.  Prior Therapy:  1. Status post laparoscopic hemicolectomy, pathology revealing T3 N0 disease with 0/22 lymph nodes involved.  He did not have any margins involved.  No lymphovascular invasion.  He declined adjuvant chemotherapy. 2. Status post colonoscopy done in July of 2011 without any evidence of malignancy.  Current therapy: Observation and follow up.   Interim History:  [name] presents today for a followup visit with his wife. Since the last visit, he continues to do well without any complaints. He has no symptoms or any evidence to suggest recurrent disease.  He had not reported any abdominal pain.  He had not reported any hematochezia.  He had not reported any melena.  His appetite has been excellent.  His performance status and activity level are overall unchanged at the time being.  He had not reported any hematochezia.  He had not reported any melena. His appetite is reasonable and has not had any early satiety. He did have an MRI of the brain on 06/09/2014 for symptoms of dizziness. His MRI was without any abnormalities in his symptoms have resolved. He does not report any syncope or seizures. He does not report any fevers or chills or sweats. He does not report any urinary symptoms. He does not report any skeletal complaints. The rest of his review of systems unremarkable.     Medications: I have reviewed the patient's current medications.   Current Outpatient Prescriptions  Medication Sig Dispense Refill  . aspirin 81 MG tablet Take 81 mg by mouth daily.    Marland Kitchen GAVILYTE-N WITH FLAVOR PACK 420 G solution Take 420 g by mouth daily.    Marland Kitchen glimepiride (AMARYL) 4 MG tablet 4 mg Daily.    Marland Kitchen  lisinopril (PRINIVIL,ZESTRIL) 20 MG tablet Take 20 mg by mouth daily.    . metFORMIN (GLUCOPHAGE) 1000 MG tablet 1,000 mg. 1 1/2 table am and 1 tablet @@ bedtime    . Multiple Vitamins-Minerals (MULTIVITAMIN WITH MINERALS) tablet Take 1 tablet by mouth daily.    . pioglitazone (ACTOS) 45 MG tablet Take 45 mg by mouth daily.    . simvastatin (ZOCOR) 80 MG tablet Take 80 mg by mouth. Every evening    . vitamin B-12 (CYANOCOBALAMIN) 100 MCG tablet Take 50 mcg by mouth daily.     No current facility-administered medications for this visit.    Allergies: No Known Allergies  Past Medical History, Surgical history, Social history, and Family History were reviewed and updated.  Physical Exam: Blood pressure 158/83, pulse 63, temperature 98.7 F (37.1 C), temperature source Axillary, resp. rate 18, height 5\' 10"  (1.778 m), weight 218 lb 12.8 oz (99.247 kg), SpO2 100 %. ECOG: 1 General appearance: alert Head: Normocephalic, without obvious abnormality Neck: no adenopathy Lymph nodes: Cervical, supraclavicular, and axillary nodes normal. Heart:regular rate and rhythm, S1, S2 normal, no murmur, click, rub or gallop Lung:chest clear, no wheezing, rales, normal symmetric air entry Abdomin: soft, non-tender, without masses or organomegaly EXT:no erythema, induration, or nodules   Lab Results: Lab Results  Component Value Date   WBC 7.3 07/01/2014   HGB 14.3 07/01/2014   HCT 44.1 07/01/2014   MCV 83.8 07/01/2014   PLT 173 07/01/2014  EXAM: CT CHEST, ABDOMEN, AND PELVIS WITH CONTRAST  TECHNIQUE: Multidetector CT imaging of the chest, abdomen and pelvis was performed following the standard protocol during bolus administration of intravenous contrast.  CONTRAST: 151mL OMNIPAQUE IOHEXOL 300 MG/ML SOLN  COMPARISON: 06/27/2013.  FINDINGS: CT CHEST FINDINGS  No pathologically enlarged mediastinal, hilar or axillary lymph nodes. Three-vessel coronary artery calcification.  Heart is at the upper limits of normal in size to mildly enlarged. No pericardial effusion. Incidental note is made of bilateral gynecomastia.  Mild volume loss in the lingula and both lower lobes, as before. Lungs are otherwise clear. No pleural fluid. Airway is unremarkable.  CT ABDOMEN AND PELVIS FINDINGS  Hepatobiliary: Liver is decreased in attenuation diffusely but is otherwise unremarkable. Stones layer in the gallbladder. No biliary ductal dilatation.  Pancreas: Negative.  Spleen: Negative.  Adrenals/Urinary Tract: Adrenal glands and kidneys are unremarkable. Ureters are decompressed. There is mass effect on the bladder by an enlarged prostate.  Stomach/Bowel: Stomach and small bowel are unremarkable. Patient is status post right hemicolectomy. Colon is otherwise unremarkable.  Vascular/Lymphatic: Atherosclerotic calcification of the arterial vasculature without abdominal aortic aneurysm. No pathologically enlarged lymph nodes.  Reproductive: Prostate is enlarged, measuring approximately 7.1 cm.  Other: No free fluid. Mesenteries and peritoneum are unremarkable.  Musculoskeletal: No worrisome lytic or sclerotic lesions.  IMPRESSION: 1. Postoperative changes of right hemicolectomy without evidence of recurrent or metastatic disease. 2. Three-vessel coronary artery calcification. 3. Hepatic steatosis. 4. Cholelithiasis. 5. Enlarged prostate.    Impression and Plan:  This is a pleasant 73 year old gentleman with the following issues. 1. Stage II colorectal cancer.  Presented with T3 N0.  He had excellent lymph node dissection and refused adjuvant chemotherapy. CT scans on 07/01/2014 were discussed today and did not show any disease. His laboratory testing and surveillance scans did not show any evidence of relapse disease over 5 years out. At this point, he does not require any further oncology follow-up and I will be happy to see him as needed. I  continue to encourage him to perform age-appropriate cancer screening and keep with routine colonoscopies. Although I see very little chance for him to see recurrence of this cancer, he is at risk of developing other colorectal cancers and that will he needs to keep up with colonoscopies.  2. Iron deficiency anemia.  That has resolved. 3. Hypertension.  This is managed by his primary care physician. 4. Colonoscopy screening. Last one done in in 2014. He is informed about the importance of adhering to repeat colonoscopies which will probably be in 2016 oh 2017.    Zola Button, MD 11/19/20151:17 PM

## 2014-12-07 DIAGNOSIS — E1165 Type 2 diabetes mellitus with hyperglycemia: Secondary | ICD-10-CM | POA: Diagnosis not present

## 2014-12-07 DIAGNOSIS — I6350 Cerebral infarction due to unspecified occlusion or stenosis of unspecified cerebral artery: Secondary | ICD-10-CM | POA: Diagnosis not present

## 2014-12-07 DIAGNOSIS — R5383 Other fatigue: Secondary | ICD-10-CM | POA: Diagnosis not present

## 2014-12-07 DIAGNOSIS — E780 Pure hypercholesterolemia: Secondary | ICD-10-CM | POA: Diagnosis not present

## 2014-12-07 DIAGNOSIS — Z85038 Personal history of other malignant neoplasm of large intestine: Secondary | ICD-10-CM | POA: Diagnosis not present

## 2014-12-07 DIAGNOSIS — I10 Essential (primary) hypertension: Secondary | ICD-10-CM | POA: Diagnosis not present

## 2014-12-14 DIAGNOSIS — H40013 Open angle with borderline findings, low risk, bilateral: Secondary | ICD-10-CM | POA: Diagnosis not present

## 2014-12-14 DIAGNOSIS — E119 Type 2 diabetes mellitus without complications: Secondary | ICD-10-CM | POA: Diagnosis not present

## 2014-12-22 DIAGNOSIS — H40023 Open angle with borderline findings, high risk, bilateral: Secondary | ICD-10-CM | POA: Diagnosis not present

## 2015-02-18 DIAGNOSIS — R195 Other fecal abnormalities: Secondary | ICD-10-CM | POA: Diagnosis not present

## 2015-02-18 DIAGNOSIS — Z85038 Personal history of other malignant neoplasm of large intestine: Secondary | ICD-10-CM | POA: Diagnosis not present

## 2015-04-22 DIAGNOSIS — Z86010 Personal history of colonic polyps: Secondary | ICD-10-CM | POA: Diagnosis not present

## 2015-04-22 DIAGNOSIS — K921 Melena: Secondary | ICD-10-CM | POA: Diagnosis not present

## 2015-05-06 ENCOUNTER — Other Ambulatory Visit: Payer: Self-pay | Admitting: Gastroenterology

## 2015-05-06 DIAGNOSIS — D125 Benign neoplasm of sigmoid colon: Secondary | ICD-10-CM | POA: Diagnosis not present

## 2015-05-06 DIAGNOSIS — K2970 Gastritis, unspecified, without bleeding: Secondary | ICD-10-CM | POA: Diagnosis not present

## 2015-05-06 DIAGNOSIS — K640 First degree hemorrhoids: Secondary | ICD-10-CM | POA: Diagnosis not present

## 2015-05-06 DIAGNOSIS — K449 Diaphragmatic hernia without obstruction or gangrene: Secondary | ICD-10-CM | POA: Diagnosis not present

## 2015-05-06 DIAGNOSIS — K921 Melena: Secondary | ICD-10-CM | POA: Diagnosis not present

## 2015-05-06 DIAGNOSIS — K259 Gastric ulcer, unspecified as acute or chronic, without hemorrhage or perforation: Secondary | ICD-10-CM | POA: Diagnosis not present

## 2015-05-06 DIAGNOSIS — K2950 Unspecified chronic gastritis without bleeding: Secondary | ICD-10-CM | POA: Diagnosis not present

## 2015-05-06 DIAGNOSIS — D126 Benign neoplasm of colon, unspecified: Secondary | ICD-10-CM | POA: Diagnosis not present

## 2015-06-08 DIAGNOSIS — I10 Essential (primary) hypertension: Secondary | ICD-10-CM | POA: Diagnosis not present

## 2015-06-08 DIAGNOSIS — E7800 Pure hypercholesterolemia, unspecified: Secondary | ICD-10-CM | POA: Diagnosis not present

## 2015-06-08 DIAGNOSIS — E669 Obesity, unspecified: Secondary | ICD-10-CM | POA: Diagnosis not present

## 2015-06-08 DIAGNOSIS — I6350 Cerebral infarction due to unspecified occlusion or stenosis of unspecified cerebral artery: Secondary | ICD-10-CM | POA: Diagnosis not present

## 2015-06-08 DIAGNOSIS — Z23 Encounter for immunization: Secondary | ICD-10-CM | POA: Diagnosis not present

## 2015-06-08 DIAGNOSIS — E1165 Type 2 diabetes mellitus with hyperglycemia: Secondary | ICD-10-CM | POA: Diagnosis not present

## 2015-07-20 DIAGNOSIS — Z961 Presence of intraocular lens: Secondary | ICD-10-CM | POA: Diagnosis not present

## 2015-07-20 DIAGNOSIS — H43813 Vitreous degeneration, bilateral: Secondary | ICD-10-CM | POA: Diagnosis not present

## 2015-07-20 DIAGNOSIS — E113291 Type 2 diabetes mellitus with mild nonproliferative diabetic retinopathy without macular edema, right eye: Secondary | ICD-10-CM | POA: Diagnosis not present

## 2015-07-20 DIAGNOSIS — H52203 Unspecified astigmatism, bilateral: Secondary | ICD-10-CM | POA: Diagnosis not present

## 2015-10-04 DIAGNOSIS — M2550 Pain in unspecified joint: Secondary | ICD-10-CM | POA: Diagnosis not present

## 2015-10-04 DIAGNOSIS — R5383 Other fatigue: Secondary | ICD-10-CM | POA: Diagnosis not present

## 2015-10-04 DIAGNOSIS — R8290 Unspecified abnormal findings in urine: Secondary | ICD-10-CM | POA: Diagnosis not present

## 2015-10-04 DIAGNOSIS — E162 Hypoglycemia, unspecified: Secondary | ICD-10-CM | POA: Diagnosis not present

## 2015-10-04 DIAGNOSIS — R0600 Dyspnea, unspecified: Secondary | ICD-10-CM | POA: Diagnosis not present

## 2015-10-07 DIAGNOSIS — R748 Abnormal levels of other serum enzymes: Secondary | ICD-10-CM | POA: Diagnosis not present

## 2015-10-21 DIAGNOSIS — R748 Abnormal levels of other serum enzymes: Secondary | ICD-10-CM | POA: Diagnosis not present

## 2015-12-07 DIAGNOSIS — Z8739 Personal history of other diseases of the musculoskeletal system and connective tissue: Secondary | ICD-10-CM | POA: Diagnosis not present

## 2015-12-07 DIAGNOSIS — I6350 Cerebral infarction due to unspecified occlusion or stenosis of unspecified cerebral artery: Secondary | ICD-10-CM | POA: Diagnosis not present

## 2015-12-07 DIAGNOSIS — I700 Atherosclerosis of aorta: Secondary | ICD-10-CM | POA: Diagnosis not present

## 2015-12-07 DIAGNOSIS — E113213 Type 2 diabetes mellitus with mild nonproliferative diabetic retinopathy with macular edema, bilateral: Secondary | ICD-10-CM | POA: Diagnosis not present

## 2015-12-07 DIAGNOSIS — E7800 Pure hypercholesterolemia, unspecified: Secondary | ICD-10-CM | POA: Diagnosis not present

## 2015-12-07 DIAGNOSIS — R5383 Other fatigue: Secondary | ICD-10-CM | POA: Diagnosis not present

## 2015-12-07 DIAGNOSIS — Z85038 Personal history of other malignant neoplasm of large intestine: Secondary | ICD-10-CM | POA: Diagnosis not present

## 2015-12-07 DIAGNOSIS — Z79899 Other long term (current) drug therapy: Secondary | ICD-10-CM | POA: Diagnosis not present

## 2015-12-07 DIAGNOSIS — I10 Essential (primary) hypertension: Secondary | ICD-10-CM | POA: Diagnosis not present

## 2015-12-07 DIAGNOSIS — Z125 Encounter for screening for malignant neoplasm of prostate: Secondary | ICD-10-CM | POA: Diagnosis not present

## 2016-06-08 DIAGNOSIS — I10 Essential (primary) hypertension: Secondary | ICD-10-CM | POA: Diagnosis not present

## 2016-06-08 DIAGNOSIS — Z7984 Long term (current) use of oral hypoglycemic drugs: Secondary | ICD-10-CM | POA: Diagnosis not present

## 2016-06-08 DIAGNOSIS — E663 Overweight: Secondary | ICD-10-CM | POA: Diagnosis not present

## 2016-06-08 DIAGNOSIS — R030 Elevated blood-pressure reading, without diagnosis of hypertension: Secondary | ICD-10-CM | POA: Diagnosis not present

## 2016-06-08 DIAGNOSIS — I700 Atherosclerosis of aorta: Secondary | ICD-10-CM | POA: Diagnosis not present

## 2016-06-08 DIAGNOSIS — R2689 Other abnormalities of gait and mobility: Secondary | ICD-10-CM | POA: Diagnosis not present

## 2016-06-08 DIAGNOSIS — K219 Gastro-esophageal reflux disease without esophagitis: Secondary | ICD-10-CM | POA: Diagnosis not present

## 2016-06-08 DIAGNOSIS — E113213 Type 2 diabetes mellitus with mild nonproliferative diabetic retinopathy with macular edema, bilateral: Secondary | ICD-10-CM | POA: Diagnosis not present

## 2016-06-16 ENCOUNTER — Other Ambulatory Visit: Payer: Self-pay | Admitting: Family Medicine

## 2016-06-16 DIAGNOSIS — R42 Dizziness and giddiness: Secondary | ICD-10-CM

## 2016-06-28 DIAGNOSIS — I10 Essential (primary) hypertension: Secondary | ICD-10-CM | POA: Diagnosis not present

## 2016-06-28 DIAGNOSIS — R2990 Unspecified symptoms and signs involving the nervous system: Secondary | ICD-10-CM | POA: Diagnosis not present

## 2016-06-28 DIAGNOSIS — Z23 Encounter for immunization: Secondary | ICD-10-CM | POA: Diagnosis not present

## 2016-07-01 ENCOUNTER — Ambulatory Visit
Admission: RE | Admit: 2016-07-01 | Discharge: 2016-07-01 | Disposition: A | Discharge status: Home / Self Care | Payer: Commercial Managed Care - HMO | Source: Ambulatory Visit | Attending: Family Medicine | Admitting: Family Medicine

## 2016-07-01 DIAGNOSIS — R42 Dizziness and giddiness: Secondary | ICD-10-CM | POA: Diagnosis not present

## 2016-07-01 MED ORDER — GADOBENATE DIMEGLUMINE 529 MG/ML IV SOLN
20.0000 mL | Freq: Once | INTRAVENOUS | Status: AC | PRN
  Administered 2016-07-01: 20 mL via INTRAVENOUS

## 2016-07-05 DIAGNOSIS — E113213 Type 2 diabetes mellitus with mild nonproliferative diabetic retinopathy with macular edema, bilateral: Secondary | ICD-10-CM | POA: Diagnosis not present

## 2016-07-05 DIAGNOSIS — H52203 Unspecified astigmatism, bilateral: Secondary | ICD-10-CM | POA: Diagnosis not present

## 2016-07-05 DIAGNOSIS — E113292 Type 2 diabetes mellitus with mild nonproliferative diabetic retinopathy without macular edema, left eye: Secondary | ICD-10-CM | POA: Diagnosis not present

## 2016-07-05 DIAGNOSIS — E11321 Type 2 diabetes mellitus with mild nonproliferative diabetic retinopathy with macular edema: Secondary | ICD-10-CM | POA: Diagnosis not present

## 2016-07-05 DIAGNOSIS — Z961 Presence of intraocular lens: Secondary | ICD-10-CM | POA: Diagnosis not present

## 2016-07-06 ENCOUNTER — Other Ambulatory Visit: Payer: Commercial Managed Care - HMO

## 2016-07-11 ENCOUNTER — Ambulatory Visit: Payer: Commercial Managed Care - HMO | Attending: Family Medicine | Admitting: Physical Therapy

## 2016-07-11 DIAGNOSIS — R2681 Unsteadiness on feet: Secondary | ICD-10-CM | POA: Insufficient documentation

## 2016-07-11 DIAGNOSIS — M6281 Muscle weakness (generalized): Secondary | ICD-10-CM | POA: Insufficient documentation

## 2016-07-19 ENCOUNTER — Ambulatory Visit: Payer: Commercial Managed Care - HMO | Admitting: Physical Therapy

## 2016-07-19 DIAGNOSIS — M6281 Muscle weakness (generalized): Secondary | ICD-10-CM

## 2016-07-19 DIAGNOSIS — R2681 Unsteadiness on feet: Secondary | ICD-10-CM | POA: Diagnosis not present

## 2016-07-19 NOTE — Therapy (Addendum)
[facility] [address] [place], [place], [address] Phone: [phone]   Fax:  [phone]  Physical Therapy Evaluation  Patient Details  Name: Ronald Dyer MRN: [id number] Date of Birth: July 01, 1941 Referring Provider: Dr. [name]  Encounter Date: 07/19/2016      PT End of Session - 07/19/16 1109    Visit Number 1   Number of Visits 16   Date for PT Re-Evaluation 09/13/16   Authorization Type [employer] g-codes, authorization required   PT Start Time 1020   PT Stop Time 1058   PT Time Calculation (min) 38 min   Equipment Utilized During Treatment Gait belt   Activity Tolerance Patient tolerated treatment well   Behavior During Therapy [facility] for tasks assessed/performed      No past medical history on file.  No past surgical history on file.  There were no vitals filed for this visit.       Subjective Assessment - 07/19/16 1022    Subjective Pt is a 75 y/o male who presents to OPPT for balance difficulties.  Pt also reports difficulty with transfers and also c/o diffiuclty writing.  Pt also expresses word findings trouble.  Pt poor historian and unable to recall prior hx of CVA.   Pertinent History CVA, HTN, depression, ETOH abuse, colon cancer, COPD   Limitations Walking;Writing   Patient Stated Goals improve walking, balance   Currently in Pain? No/denies            [facility] PT Assessment - 07/19/16 1024      Assessment   Medical Diagnosis falls, imbalance   Referring Provider Dr. [name]   Onset Date/Surgical Date 06/18/16  "about a month ago I guess"   Next MD Visit 07/27/16   Prior Therapy none     Precautions   Precautions Fall     Restrictions   Weight Bearing Restrictions No     Balance Screen   Has the patient fallen in the past 6 months No   Has the patient had a decrease in activity level because of a fear of falling?  Yes   Is the patient reluctant to leave their home  because of a fear of falling?  Yes     [place] residence   Living Arrangements Spouse/significant other   Available Help at Discharge Available 24 hours/day;Family   Type of [place] to enter   Entrance Stairs-Number of Steps 3   Entrance Stairs-Rails Right   [place] One level   [place] None     Prior Function   Level of Independence Independent   Vocation Retired   Biomedical scientist retired from [employer] "go to bar," watch TV     Cognition   Overall Cognitive Status History of cognitive impairments - at baseline   Attention Sustained   Memory Impaired   Memory Impairment Decreased long term memory;Decreased short term memory     Observation/Other Assessments   Focus on Therapeutic Outcomes (FOTO)  43 (57% limited; predicted 37% limited)   Activities of Balance Confidence Scale (ABC Scale)  41.9%     Posture/Postural Control   Posture/Postural Control Postural limitations   Postural Limitations Rounded Shoulders;Forward head     Strength   Strength Assessment Site Hip;Knee;Ankle   Right Hip Flexion 3+/5   Left Hip Flexion 3+/5   Right/Left Knee Right;Left   Right Knee Flexion 5/5  Right Knee Extension 5/5   Left Knee Flexion 5/5   Left Knee Extension 5/5   Right Ankle Dorsiflexion 5/5   Left Ankle Dorsiflexion 5/5     Ambulation/Gait   Ambulation/Gait Yes   Ambulation/Gait Assistance 4: Min guard   Ambulation Distance (Feet) 100 Feet   Assistive device None   Gait Pattern Shuffle;Ataxic;Wide base of support   Gait velocity 1.25 ft/sec  26.28 sec     Standardized Balance Assessment   Standardized Balance Assessment Berg Balance Test;Timed Up and Go Test     Berg Balance Test   Sit to Stand Able to stand without using hands and stabilize independently   Standing Unsupported Able to stand safely 2 minutes   Sitting with Back Unsupported but Feet Supported on Floor or  Stool Able to sit safely and securely 2 minutes   Stand to Sit Sits safely with minimal use of hands   Transfers Able to transfer safely, minor use of hands   Standing Unsupported with Eyes Closed Able to stand 10 seconds safely   Standing Ubsupported with Feet Together Able to place feet together independently and stand 1 minute safely   From Standing, Reach Forward with Outstretched Arm Can reach forward >12 cm safely (5")   From Standing Position, Pick up Object from [place] to pick up shoe safely and easily   From Standing Position, Turn to Look Behind Over each Shoulder Looks behind one side only/other side shows less weight shift   Turn 360 Degrees Able to turn 360 degrees safely but slowly   Standing Unsupported, Alternately Place Feet on Step/Stool Able to complete >2 steps/needs minimal assist   Standing Unsupported, One Foot in Front Able to take small step independently and hold 30 seconds   Standing on One Leg Tries to lift leg/unable to hold 3 seconds but remains standing independently   Total Score 44     Timed Up and Go Test   Normal TUG (seconds) 41.79                           PT Education - 07/19/16 1107    Education provided Yes   Education Details clinical findings, POC, goals of care   Person(s) Educated Patient   Methods Explanation   Comprehension Verbalized understanding          PT Short Term Goals - 07/19/16 1115      PT SHORT TERM GOAL #1   Title independent with HEP (Target Date for all STGs: 08/16/16)   Status New     PT SHORT TERM GOAL #2   Title improve timed up and go to < 33 sec for improved mobility    Status New     PT SHORT TERM GOAL #3   Title improve gait velocity to > 1.8 ft/sec for decreased fall risk   Status New     PT SHORT TERM GOAL #4   Title amb > 350' on various indoor/paved outdoor surfaces with LRAD modified independent for improved function and moiblity    Status New           PT Long Term  Goals - 07/19/16 1119      PT LONG TERM GOAL #1   Title verbalize understanding of fall prevention strategies to decrease risk of falls (Target Date for all LTGs: 09/13/16)   Status New     PT LONG TERM GOAL #2   Title improve BERG balance  test to >/= 50/56 to demonstrate improved balance    Status New     PT LONG TERM GOAL #3   Title improve timed up and go to < 27 sec with LRAD for improved mobility    Status New     PT LONG TERM GOAL #4   Title improve gait velocity > 2.5 ft/sec for improved mobility   Status New     PT LONG TERM GOAL #5   Title amb > 500' on various indoor/outdoor surfaces with LRAD modified independent for improved community access    Status New               Plan - 07/19/16 1110    Clinical Impression Statement Pt is a 76 y/o male who presents to OPPT for gait instability and decreased balance.  Pt demonstrates decreased hip strength and is a high risk for falls as indicated by BERG, TUG and gait velocity.  Pt with hx of ETOH abuse and feel this is contributing to symptoms.  Will benefit from PT to address deficits.  Also recommend OT and SLP as pt expresses handwriting changes and word finding difficulties.  Unsure when these symptoms began (recently or due to CVA) as pt poor historian but if symptoms and changes are new and different from CVA may need to consider neurology consult.  Will monitor and make recommendations as needed.   Rehab Potential Good   Clinical Impairments Affecting Rehab Potential cognitive impairments, ETOH abuse   PT Frequency 2x / week  may need to decrease to 1x/wk due to high copay   PT Duration 8 weeks   PT Treatment/Interventions ADLs/Self Care Home Management;Cryotherapy;Moist Heat;Neuromuscular re-education;Balance training;Therapeutic exercise;Therapeutic activities;Functional mobility training;Stair training;Gait training;Patient/family education;Vestibular   PT Next Visit Plan establish HEP for hip strengthening and  balance; ?try cane/walking stick/walker   Consulted and Agree with Plan of Care Patient      Patient will benefit from skilled therapeutic intervention in order to improve the following deficits and impairments:  Abnormal gait, Decreased balance, Decreased mobility, Decreased strength, Postural dysfunction, Decreased coordination  Visit Diagnosis: Unsteadiness on feet - Plan: PT plan of care cert/re-cert  Muscle weakness (generalized) - Plan: PT plan of care cert/re-cert      G-Codes - 16/38/46 1123    Functional Assessment Tool Used BERG 44/56, timed up and go 41.79, gait velocity 1.24 ft/sec   Functional Limitation Mobility: Walking and moving around   Mobility: Walking and Moving Around Current Status (K5993) At least 60 percent but less than 80 percent impaired, limited or restricted   Mobility: Walking and Moving Around Goal Status (T7017) At least 20 percent but less than 40 percent impaired, limited or restricted     Late entry D/C status G code: At least 60% but less than 80% limited or restricted. Laureen Abrahams, PT, DPT 08/07/16 9:55 AM   Problem List Patient Active Problem List   Diagnosis Date Noted  . Colon cancer ([place]) 03/25/2014  . DIABETES MELLITUS II, UNCOMPLICATED 79/39/0300  . DEPRESSION, MAJOR, RECURRENT 10/18/2006  . ALCOHOL ABUSE, UNSPECIFIED 10/18/2006  . COPD 10/18/2006  . GASTROESOPHAGEAL REFLUX, NO ESOPHAGITIS 10/18/2006      Laureen Abrahams, PT, DPT 07/19/16 11:28 AM    [facility] [address] [place], [place], [address] Phone: [phone]   Fax:  [phone]  Name: Ronald Dyer MRN: [id number] Date of Birth: Feb 27, 1941        PHYSICAL THERAPY DISCHARGE SUMMARY  Visits from Start of Care: 1  Current functional level related to goals / functional outcomes: See above   Remaining deficits: See above   Education / Equipment: n/a  Plan: Patient  agrees to discharge.  Patient goals were not met. Patient is being discharged due to financial reasons.  ?????    Pt canceled all remaining appointments as he is unable to afford.    Laureen Abrahams, PT, DPT 08/07/16 9:54 AM  [facility] [address] [place],  [address]  [phone] (office) [phone] (fax)

## 2016-07-26 ENCOUNTER — Ambulatory Visit: Payer: Commercial Managed Care - HMO | Admitting: Physical Therapy

## 2016-07-27 ENCOUNTER — Encounter: Payer: Commercial Managed Care - HMO | Admitting: Physical Therapy

## 2016-07-27 DIAGNOSIS — I10 Essential (primary) hypertension: Secondary | ICD-10-CM | POA: Diagnosis not present

## 2016-07-27 DIAGNOSIS — R2689 Other abnormalities of gait and mobility: Secondary | ICD-10-CM | POA: Diagnosis not present

## 2016-08-01 ENCOUNTER — Encounter: Payer: Commercial Managed Care - HMO | Admitting: Physical Therapy

## 2016-08-03 ENCOUNTER — Encounter: Payer: Commercial Managed Care - HMO | Admitting: Physical Therapy

## 2016-08-08 ENCOUNTER — Encounter: Payer: Commercial Managed Care - HMO | Admitting: Physical Therapy

## 2016-08-10 ENCOUNTER — Encounter: Payer: Commercial Managed Care - HMO | Admitting: Physical Therapy

## 2016-08-16 ENCOUNTER — Encounter: Payer: Commercial Managed Care - HMO | Admitting: Physical Therapy

## 2016-08-17 ENCOUNTER — Encounter: Payer: Commercial Managed Care - HMO | Admitting: Physical Therapy

## 2016-09-21 DIAGNOSIS — K921 Melena: Secondary | ICD-10-CM | POA: Diagnosis not present

## 2016-09-21 DIAGNOSIS — R197 Diarrhea, unspecified: Secondary | ICD-10-CM | POA: Diagnosis not present

## 2016-11-28 DIAGNOSIS — K219 Gastro-esophageal reflux disease without esophagitis: Secondary | ICD-10-CM | POA: Diagnosis not present

## 2016-11-28 DIAGNOSIS — F325 Major depressive disorder, single episode, in full remission: Secondary | ICD-10-CM | POA: Diagnosis not present

## 2016-11-28 DIAGNOSIS — M25512 Pain in left shoulder: Secondary | ICD-10-CM | POA: Diagnosis not present

## 2016-11-28 DIAGNOSIS — E7800 Pure hypercholesterolemia, unspecified: Secondary | ICD-10-CM | POA: Diagnosis not present

## 2016-11-28 DIAGNOSIS — I700 Atherosclerosis of aorta: Secondary | ICD-10-CM | POA: Diagnosis not present

## 2016-11-28 DIAGNOSIS — Z0000 Encounter for general adult medical examination without abnormal findings: Secondary | ICD-10-CM | POA: Diagnosis not present

## 2016-11-28 DIAGNOSIS — Z85038 Personal history of other malignant neoplasm of large intestine: Secondary | ICD-10-CM | POA: Diagnosis not present

## 2016-11-28 DIAGNOSIS — I10 Essential (primary) hypertension: Secondary | ICD-10-CM | POA: Diagnosis not present

## 2016-11-28 DIAGNOSIS — E113213 Type 2 diabetes mellitus with mild nonproliferative diabetic retinopathy with macular edema, bilateral: Secondary | ICD-10-CM | POA: Diagnosis not present

## 2017-01-02 DIAGNOSIS — H43813 Vitreous degeneration, bilateral: Secondary | ICD-10-CM | POA: Diagnosis not present

## 2017-01-02 DIAGNOSIS — Z961 Presence of intraocular lens: Secondary | ICD-10-CM | POA: Diagnosis not present

## 2017-01-02 DIAGNOSIS — E113293 Type 2 diabetes mellitus with mild nonproliferative diabetic retinopathy without macular edema, bilateral: Secondary | ICD-10-CM | POA: Diagnosis not present

## 2017-04-06 ENCOUNTER — Other Ambulatory Visit: Payer: Self-pay | Admitting: Family Medicine

## 2017-04-06 DIAGNOSIS — R4789 Other speech disturbances: Secondary | ICD-10-CM

## 2017-04-06 DIAGNOSIS — R413 Other amnesia: Secondary | ICD-10-CM | POA: Diagnosis not present

## 2017-04-06 DIAGNOSIS — R2689 Other abnormalities of gait and mobility: Secondary | ICD-10-CM | POA: Diagnosis not present

## 2017-04-06 DIAGNOSIS — Z7984 Long term (current) use of oral hypoglycemic drugs: Secondary | ICD-10-CM | POA: Diagnosis not present

## 2017-04-06 DIAGNOSIS — Z8673 Personal history of transient ischemic attack (TIA), and cerebral infarction without residual deficits: Secondary | ICD-10-CM | POA: Diagnosis not present

## 2017-04-06 DIAGNOSIS — R300 Dysuria: Secondary | ICD-10-CM | POA: Diagnosis not present

## 2017-04-06 DIAGNOSIS — E1165 Type 2 diabetes mellitus with hyperglycemia: Secondary | ICD-10-CM | POA: Diagnosis not present

## 2017-04-12 ENCOUNTER — Other Ambulatory Visit: Payer: Self-pay

## 2017-04-12 DIAGNOSIS — E0800 Diabetes mellitus due to underlying condition with hyperosmolarity without nonketotic hyperglycemic-hyperosmolar coma (NKHHC): Secondary | ICD-10-CM

## 2017-04-12 NOTE — Patient Outreach (Signed)
[place] [facility]) Care Management  04/12/2017  Ronald Dyer [facility] 04/13/1941 [id number]   Telephonic screening completed on physician referral from PCP Dr. [name].  HIPAA identifiers confirmed.  Patient is 76 year old's history includes DM, HTN, and COPD.  MD referral also states history of stroke, for which she is ordering PT, OT, and Speech Therapy.  Explanation of THN services provided to patient and his wife.  Patient gave permission for his wife to speak for him, although he listened via speaker phone and participated in the conversation.  Patient requests telephonic health coach services.  Plan: Patient will keep written record of cbgs and blood pressure.          RN will Medical laboratory scientific officer.          RN will contact MD re elevated BPs reported by wife, and clarify self-management plan for Diabetes.          RN will notify MD that she will need to order home health services (PT, OT, Speech Therapy).          RN will schedule completion of initial assessment within one week.  Candie Mile, RN, MSN [facility] [phone] Fax [phone]

## 2017-04-12 NOTE — Patient Outreach (Signed)
[place] [facility]) Care Management  04/12/2017  Ronald Dyer [facility] Aug 02, 1941 [id number]   4PM:  Telephone call to PCP office to follow up on message sent to PCP earlier today about patient's elevated BP. Spoke with nurse, who stated that Dr. Sabra Heck had ordered HCTZ 3 times a day, and that the patient was notified about same. RN also requested guidance about whether patient is to check cbgs and whether he is supposed to follow a diabetic and/or low salt diet. Office nurse to call back.  Candie Mile, RN, MSN [place] [phone] Fax [phone]

## 2017-04-13 ENCOUNTER — Encounter: Payer: Self-pay | Admitting: *Deleted

## 2017-04-18 ENCOUNTER — Other Ambulatory Visit: Payer: Self-pay

## 2017-04-18 VITALS — BP 151/82

## 2017-04-18 DIAGNOSIS — E0800 Diabetes mellitus due to underlying condition with hyperosmolarity without nonketotic hyperglycemic-hyperosmolar coma (NKHHC): Secondary | ICD-10-CM

## 2017-04-18 NOTE — Patient Outreach (Signed)
[place] [facility]) Care Management  04/18/2017  [place] 1941-03-17 [id number]   Follow up phone call to Dr. Ammie Ferrier office.  Spoke with nurse Sharee Pimple.  She reports patient's A1C which was checked last week was 6.5.  Sharee Pimple reports PCP is waiting for other lab results before deciding about a plan of care for him. Sharee Pimple confirmed that Dr. Sabra Heck did order HCTZ 25mg  to be taken each am for elevated BP, and that the patient was instructed re same.  Sharee Pimple stated she will call back after speaking with Dr. Sabra Heck.  Candie Mile, RN, MSN [place] [phone] Fax [phone]

## 2017-04-18 NOTE — Patient Outreach (Signed)
[place] [facility]) Care Management  [place]  04/18/2017   [place] September 02, 1940 [id number]  Initial assessment completed today after screening call completed on 04-12-17.  Encounter Medications:  Outpatient Encounter Prescriptions as of 04/18/2017  Medication Sig Note  . hydrochlorothiazide (HYDRODIURIL) 25 MG tablet Take 25 mg by mouth daily.   Marland Kitchen aspirin 81 MG tablet Take 81 mg by mouth daily.   Marland Kitchen GAVILYTE-N WITH FLAVOR PACK 420 G solution Take 420 g by mouth daily. 07/01/2013: Received from: External Pharmacy Received Sig:   . glimepiride (AMARYL) 4 MG tablet 4 mg Daily.   Marland Kitchen lisinopril (PRINIVIL,ZESTRIL) 20 MG tablet Take 20 mg by mouth daily. 07/09/2014: Received from: External Pharmacy Received Sig:   . metFORMIN (GLUCOPHAGE) 1000 MG tablet 1,000 mg. 1 1/2 table am and 1 tablet @@ bedtime   . Multiple Vitamins-Minerals (MULTIVITAMIN WITH MINERALS) tablet Take 1 tablet by mouth daily.   . pioglitazone (ACTOS) 45 MG tablet Take 45 mg by mouth daily.   . simvastatin (ZOCOR) 80 MG tablet Take 80 mg by mouth. Every evening   . vitamin B-12 (CYANOCOBALAMIN) 100 MCG tablet Take 50 mcg by mouth daily.    No facility-administered encounter medications on file as of 04/18/2017.     Functional Status:  In your present state of health, do you have any difficulty performing the following activities: 04/18/2017  Hearing? Y  Comment Mild hearing loss.  Vision? N  Difficulty concentrating or making decisions? Y  Walking or climbing stairs? Y  Comment Slow, unsteady gait.  Dressing or bathing? N  Doing errands, shopping? Y  Preparing Food and eating ? N  Using the Toilet? N  In the past six months, have you accidently leaked urine? N  Do you have problems with loss of bowel control? N  Managing your Medications? Y  Managing your Finances? Y  Housekeeping or managing your Housekeeping? Y  Some recent data might be hidden    Fall/Depression Screening: Fall  Risk  04/18/2017 04/12/2017 07/09/2014  Falls in the past year? No No No  Comment - Patient reports gait is very slow/unsteady, but reports no falls. -  Risk for fall due to : Impaired balance/gait Impaired balance/gait -   PHQ 2/9 Scores 04/18/2017 07/09/2014  PHQ - 2 Score 2 0  PHQ- 9 Score 5 -   THN CM Care Plan Problem One     Most Recent Value  Care Plan Problem One  Knowledge deficit regarding self-management of Diabetes.  Role Documenting the Problem One  [place] for Problem One  Active  THN Long Term Goal   Patient/ wife will be able to discuss actions implemented for diabetic self-management (regular monitoring of cbgs and adherence to diabetic dietary guidelines) within 60 days.  THN Long Term Goal Start Date  04/18/17  Interventions for Problem One Long Term Goal  RN has contacted PCP and requested guidelines for DM self-management.    THN CM Short Term Goal #1   Patient/wife will monitor and record cbg readings (frequency to be determined by PCP).  THN CM Short Term Goal #1 Start Date  04/18/17  Interventions for Short Term Goal #1  RN has contacted PCP re frequency of testing recommended.  THN CM Short Term Goal #2   Patient will demonstrate increased knowledge of diabetic dietary guidelines by naming 2 items to limit or avoide within 30 days  THN CM Short Term Goal #2 Start Date  04/18/17  Interventions for Short Term Goal #2  Discussion with wife re healthy food choices.    THN CM Care Plan Problem Two     Most Recent Value  Care Plan Problem Two  Self-management needs related to hypertension.  Role Documenting the Problem Two  [place] for Problem Two  Active  THN CM Short Term Goal #1   Patient's BP readings will improve to near normal guidelines (120/80) within 30 days.  THN CM Short Term Goal #1 Start Date  04/18/17  Interventions for Short Term Goal #2   Reviewed with wife new medication HCTZ.  Wife is recording daily BPs.  Reading today  151/82.       Plan: Patient will take medications as ordered.           Wife will monitor and record cbgs and BP.           RN will Curator.           RN will follow up in September.  Candie Mile, RN, MSN [place] [phone] Fax [phone]

## 2017-04-19 ENCOUNTER — Ambulatory Visit
Admission: RE | Admit: 2017-04-19 | Discharge: 2017-04-19 | Disposition: A | Discharge status: Home / Self Care | Payer: Commercial Managed Care - HMO | Source: Ambulatory Visit | Attending: Family Medicine | Admitting: Family Medicine

## 2017-04-19 ENCOUNTER — Other Ambulatory Visit: Payer: Self-pay | Admitting: *Deleted

## 2017-04-19 DIAGNOSIS — R413 Other amnesia: Secondary | ICD-10-CM | POA: Diagnosis not present

## 2017-04-19 DIAGNOSIS — R4789 Other speech disturbances: Secondary | ICD-10-CM

## 2017-04-19 MED ORDER — GADOBENATE DIMEGLUMINE 529 MG/ML IV SOLN
17.0000 mL | Freq: Once | INTRAVENOUS | Status: AC | PRN
  Administered 2017-04-19: 17 mL via INTRAVENOUS

## 2017-04-19 NOTE — Patient Outreach (Signed)
[name] [facility]) Care Management  04/19/2017  Ronald Dyer [birth year] [id number]   CSW made an initial attempt to try and contact patient today to perform phone assessment, as well as assess and assist with social needs and services, without success.  A HIPAA compliant message was left for patient on voicemail.  CSW is currently awaiting a return call. CSW will make a second outreach attempt within the next week, if CSW does not receive a return call from patient in the meantime. Nat Christen, BSW, MSW, LCSW  Licensed Education officer, environmental Health System  Mailing [place] [address], [place], [place] [address] Physical Address-[address], [place],  [address] Toll Free Main # [phone] Fax # [phone] Cell # [phone]  Office # [phone] [email or web address]

## 2017-04-20 ENCOUNTER — Encounter: Payer: Self-pay | Admitting: *Deleted

## 2017-04-20 ENCOUNTER — Other Ambulatory Visit: Payer: Self-pay | Admitting: *Deleted

## 2017-04-20 NOTE — Patient Outreach (Signed)
[place] [facility]) Care Management  04/20/2017  Ronald Dyer Mar 18, 1941 [id number]   CSW was able to make initial contact with patient's wife, Chisom Aust today to perform the phone assessment on patient, as well as assess and assist with social work needs and services.  CSW introduced self, explained role and types of services provided through [place] [facility] ([place] [facility]).  CSW further explained to Mrs. [place] that [place] works with patient's [place], also with [place] Management, Candie Mile. CSW then explained the reason for the call, indicating that Mrs. Rankin thought that patient would benefit from social work services and resources to assist with determining patient's co-payment amounts for in-home therapies versus outpatient therapies (physical, occupational and speech).  CSW obtained two HIPAA compliant identifiers from patient, which included patient's name and date of birth. Mrs. [place] reports that she is unable to afford the co-payment amounts for patient to go to the [facility] for therapies, five days per week, as it was going to cost them close to $300.00 per week.  CSW agreed to contact patient's insurance carrier [facility]) to inquire about co-payment amounts if patient were to receive in-home therapies.  CSW made several attempts to try and contact a representative with [employer]; however, was unsuccessful.  Several HIPAA compliant messages were left and CSW is currently awaiting a return call.  CSW agreed to follow-up with Mrs. [place] as soon as a return call is received and CSW has information to report.  Mrs. [place] voiced understanding and was agreeable to this plan.  No additional social work needs identified at this time. Nat Christen, BSW, MSW, LCSW  Licensed Education officer, environmental Health System  Mailing  [place] [address] [place], [place] [address] Physical Address-[address], [place], [place] [address] Toll Free Main # [phone] Fax # [phone] Cell # [phone]  Office # [phone] [email or web address]

## 2017-05-01 ENCOUNTER — Other Ambulatory Visit: Payer: Self-pay | Admitting: *Deleted

## 2017-05-01 NOTE — Patient Outreach (Signed)
[place] [facility]) Care Management  05/01/2017  [place] [facility] 02-Mar-1941 [id number]   CSW was able to make contact with patient's wife, Malik Paar today to follow-up regarding social work services and resources for patient.  CSW explained to Mrs. [place] that [place] was able to make contact with a representative from [place], who reports that patient does not currently have coverage for in-home physical therapy services.  However, patient does have coverage for outpatient physical therapy services, for a copayment of $40.00 per visit.  Patient, nor Mrs. [place] are aware of how many visits patient will require, requesting that CSW contact patient's Primary Care Physician, Dr. [name] to inquire.  CSW also agreed to mail patient and Mrs. [place] a Engineer, maintenance (IT) with rent and utilities to try and offset some of their monthly expenses in order for them to be able to pay the co-pays for outpatient therapy services.  CSW will then follow-up with patient and Mrs. [place] in one week to discuss findings.  Mrs. [place] voiced understanding and was agreeable to this plan. Nat Christen, BSW, MSW, LCSW  Licensed Education officer, environmental Health System  Mailing [place] [address] [place], [place] [address] Physical Address[address] [place], [place], [place] [address] Toll Free Main # [phone] Fax # [phone] Cell # [phone]  Office # [phone] [email or web address]

## 2017-05-02 NOTE — Patient Outreach (Signed)
Request received from Joanna Saporito, LCSW to mail patient personal care resources.  Information mailed today. 

## 2017-05-08 DIAGNOSIS — I10 Essential (primary) hypertension: Secondary | ICD-10-CM | POA: Diagnosis not present

## 2017-05-09 ENCOUNTER — Other Ambulatory Visit: Payer: Self-pay | Admitting: *Deleted

## 2017-05-09 NOTE — Patient Outreach (Signed)
[place] [facility]) Care Management  05/09/2017  [place] Matousek [facility] 1941/04/26 [id number]   CSW was able to make contact with patient's wife, Orton Capell today to follow-up regarding social work services and resources for patient, as well as to ensure that patient received the packet of resources mailed to his home by CSW.  Mrs. [place] admitted that she received the packet and she is currently reviewing the material with her daughter, who has agreed to assist patient with contacting various agencies and requesting financial assistance.  Mrs. [place] denied being able to identify any additional social work needs at present.  CSW was able to ensure that Mrs. [place] has the correct contact information for CSW, encouraging Mrs. [place] to contact CSW directly if social work needs arise in the near future. CSW will perform a case closure on patient, as all goals of treatment have been met from social work standpoint and no additional social work needs have been identified at this time.  CSW will notify patient's [place] with [place] Management, Candie Mile of CSW's plans to close patient's case.  CSW will fax an update to patient's Primary Care Physician, Dr. [name] to ensure that they are aware of CSW's involvement with patient's plan of care.  CSW will submit a case closure request to Verlon Setting, Care Management Assistant with [place] Management, in the form of an [employer].  CSW will ensure that Mrs. [place] is aware of Mrs. Dahlia Bailiff, RNCM with [place] Management, continued involvement with patient's care. Nat Christen, BSW, MSW, LCSW  Licensed Education officer, environmental Health System  Mailing [place] [name], [place], [place] [address] Physical Address-[address], [place], [place] [address] Toll Free Main # [phone] Fax #  [phone] Cell # [phone]  Office # [phone] [email or web address]

## 2017-05-15 ENCOUNTER — Ambulatory Visit: Payer: Self-pay

## 2017-05-17 ENCOUNTER — Other Ambulatory Visit: Payer: Self-pay

## 2017-05-17 NOTE — Patient Outreach (Signed)
[place] [facility]) Care Management  05/17/2017  Yakov Bergen [facility] 24-Dec-1940 [id number]   Unsuccessful attempt to reach patient.  Unable to leave a message.  RN will reschedule call within 10 days.  Candie Mile, RN, MSN [place] [phone] Fax [phone]

## 2017-05-18 DIAGNOSIS — R300 Dysuria: Secondary | ICD-10-CM | POA: Diagnosis not present

## 2017-05-29 ENCOUNTER — Other Ambulatory Visit: Payer: Self-pay

## 2017-05-29 DIAGNOSIS — E0800 Diabetes mellitus due to underlying condition with hyperosmolarity without nonketotic hyperglycemic-hyperosmolar coma (NKHHC): Secondary | ICD-10-CM

## 2017-05-29 NOTE — Patient Outreach (Signed)
[place]) Care Management  05/29/2017  Ronald Dyer [facility] 1940-10-05 [id number]  Telephonic assessment completed with wife (per patient consent).   Wife reports she is checking and recording cbgs, but that PCP has told her she does not need to check them every day. Wife is also recording BP checks.  Plan:  Patient will keep PCP appointment on 05-30-17.           Wife will continue to monitor cbgs and BP.           RN will follow up in November.  Candie Mile, RN, MSN [place] [phone] Fax [phone]

## 2017-05-30 DIAGNOSIS — R197 Diarrhea, unspecified: Secondary | ICD-10-CM | POA: Diagnosis not present

## 2017-05-30 DIAGNOSIS — R310 Gross hematuria: Secondary | ICD-10-CM | POA: Diagnosis not present

## 2017-05-30 DIAGNOSIS — I10 Essential (primary) hypertension: Secondary | ICD-10-CM | POA: Diagnosis not present

## 2017-06-05 DIAGNOSIS — R197 Diarrhea, unspecified: Secondary | ICD-10-CM | POA: Diagnosis not present

## 2017-06-21 ENCOUNTER — Ambulatory Visit: Payer: Self-pay | Admitting: *Deleted

## 2017-06-22 ENCOUNTER — Encounter: Payer: Self-pay | Admitting: *Deleted

## 2017-06-22 ENCOUNTER — Other Ambulatory Visit: Payer: Self-pay | Admitting: *Deleted

## 2017-06-22 DIAGNOSIS — I639 Cerebral infarction, unspecified: Secondary | ICD-10-CM | POA: Insufficient documentation

## 2017-06-22 NOTE — Patient Outreach (Addendum)
[facility] [facility]) Care Management  [place]  06/22/2017   Buck Mcaffee [facility] 10-Dec-1940 [id number]  RN Health Coach Monthly Outreach  Referral Date: 04/12/2017 Referral Source: MD Referral Reason for Referral: Disease Management Education Insurance: [facility] Medicare   Outreach Attempt: Successful outreach to patient's wife (ROI on file).  HIPAA verified with wife per patient's request.  Wife states patient is doing a lot better.  Reports primary MD discontinued metformin due to diarrhea.  States diarrhea has subsided.  Wife reports monitoring blood sugars about 3-4 times a week with fasting ranges in 120's.  Continues to monitor BP about 3 times a week with ranges 110-130's/60-70's.  Wife does acknowledge that she is not familiar with the indications for patient's medications.  Reviewed medications and indications with wife.  Offered educational handouts for medications, wife stated she did not need them and states she is managing his medications without difficulties.  Wife states she has not been able to afford to go to Outpatient Therapies as of yet.  Does want patient to be more active and socialize with friends and family more.  Wife states patient's speech is getting better and patient is ambulating with cane at times.  Discussed with wife possibility of going to 1 or 2 sessions of Outpatient Therapy to learn exercises they can do at home to increase patient's strength, endurance and speech.  Wife open to this option and agrees to contact some OP Therapy clinics to see if this is possible.  Discusses [place] and Fit offered by [facility], wife feels patient needs to get stronger    Encounter Medications:  Outpatient Encounter Prescriptions as of 06/22/2017  Medication Sig Note  . aspirin 81 MG tablet Take 81 mg by mouth daily. 06/22/2017: Taking 325 mg  . atorvastatin (LIPITOR) 10 MG tablet Take 10 mg by mouth 2 (two) times a week.   . donepezil (ARICEPT) 5 MG  tablet Take 5 mg by mouth at bedtime.   . hydrochlorothiazide (HYDRODIURIL) 25 MG tablet Take 25 mg by mouth daily.   Marland Kitchen lisinopril (PRINIVIL,ZESTRIL) 20 MG tablet Take 20 mg by mouth daily. 06/22/2017: 20 mg tablets takes 2 tablets twice a day  . Multiple Vitamins-Minerals (MULTIVITAMIN WITH MINERALS) tablet Take 1 tablet by mouth daily.   Marland Kitchen omeprazole (PRILOSEC) 40 MG capsule Take 40 mg by mouth daily.   . pioglitazone (ACTOS) 45 MG tablet Take 45 mg by mouth daily.   . sertraline (ZOLOFT) 100 MG tablet Take 100 mg by mouth daily.   . vitamin B-12 (CYANOCOBALAMIN) 100 MCG tablet Take 50 mcg by mouth daily.   Marland Kitchen GAVILYTE-N WITH FLAVOR PACK 420 G solution Take 420 g by mouth daily. 06/22/2017: Not Taking  . glimepiride (AMARYL) 4 MG tablet 4 mg Daily. 06/22/2017: Not taking per wife  . metFORMIN (GLUCOPHAGE) 1000 MG tablet 1,000 mg. 1 1/2 table am and 1 tablet @@ bedtime 06/22/2017: Per wife doctor discontinued due to diarrhea  . simvastatin (ZOCOR) 80 MG tablet Take 80 mg by mouth. Every evening 06/22/2017: Not taking per wife   No facility-administered encounter medications on file as of 06/22/2017.     Functional Status:  In your present state of health, do you have any difficulty performing the following activities: 04/20/2017 04/18/2017  Hearing? Y Y  Comment - Mild hearing loss.  Vision? N N  Difficulty concentrating or making decisions? Tempie Donning  Walking or climbing stairs? Y Y  Comment - Slow, unsteady gait.  Dressing or bathing?  N N  Doing errands, shopping? Tempie Donning  Preparing Food and eating ? N N  Using the Toilet? N N  In the past six months, have you accidently leaked urine? N N  Do you have problems with loss of bowel control? N N  Managing your Medications? Y Y  Managing your Finances? Tempie Donning  Housekeeping or managing your Housekeeping? Y Y  Some recent data might be hidden    Fall/Depression Screening: Fall Risk  06/22/2017 04/20/2017 04/18/2017  Falls in the past year? No No No   Comment - - -  Risk for fall due to : Impaired balance/gait Impaired balance/gait Impaired balance/gait   PHQ 2/9 Scores 04/20/2017 04/18/2017 07/09/2014  PHQ - 2 Score 2 2 0  PHQ- 9 Score 5 5 -    THN CM Care Plan Problem One     Most Recent Value  Care Plan Problem One  Knowledge deficit regarding self-management of Diabetes.  Role Documenting the Problem One  [place] for Problem One  Active  [facility] Long Term Goal   Patient and wife will state patient has increased his activity level and people interactions in the next 90 days  THN Long Term Goal Start Date  06/22/17  [facility] Long Term Goal Met Date  06/22/17  Interventions for Problem One Long Term Goal  Discussed with wife contacting OP Therapy to discuss option of attending 2 sessions to learn therapies/exercises she can help patient participate in to increase stregnth and endurance,  Encouraged wife to help patient socialize, Encouraged wife to assist patient to participate in [place] and Fit benefit offered by [facility],  Research scientist (medical) will send list of OP Therapy facilities for wife to contact,  Discussed with wife depression after stroke,  offered wife stroke Neurosurgeon (wife states she does not need education on stroke)  THN CM Short Term Goal #1   Wife will monitor CBG's 3 times a week as prescribed by primary care and record readings and will notify the Primary care provider with sustained hyperglycemia in the next 30 days  THN CM Short Term Goal #1 Start Date  06/22/17  [facility] CM Short Term Goal #1 Met Date  05/29/17  Interventions for Short Term Goal #1  Discussed need to continue to monitor CBG's for highs and lows with wife with the discontinuation of metformin,  Sending Living well with diabetes booklet to wife  THN CM Short Term Goal #2   Wife will be able to state signs 3 signs of hypoglycemia  THN CM Short Term Goal #2 Start Date  06/22/17  [facility] CM Short Term Goal #2 Met Date  06/22/17   Interventions for Short Term Goal #2  Reviewed with wife signs and symptoms of hypoglycemia, discussed with wife signs of hypoglycemia mimicing stroke like symptoms, Living well with Diabetes booklet    [facility] CM Care Plan Problem Two     Most Recent Value  Care Plan Problem Two  Self-management needs related to hypertension.  Role Documenting the Problem Two  [place] for Problem Two  Active  THN CM Short Term Goal #1   Patient's BP readings will improve to near normal guidelines (120/80) within 30 days.  THN CM Short Term Goal #1 Start Date  04/18/17  [facility] CM Short Term Goal #1 Met Date   06/22/17  Interventions for Short Term Goal #2   Encouraged wife to continue to monitor and record patient's BP  for primary care review,  educated wife on patient's medication indication      Appointments: Patient was just seen by primary care provider on October 10.  At this time he is refusing Flu shot due to reactions he experienced last year.  Wife reports next primary care appointment is in February 2019.  Plan: RN Health Coach to send Living well with Diabetes Booklet. RN Health Coach to send list of Outpatient Therapy Facility options for wife to review and contact. RN Health Coach will send Primary MD quarterly update. RN Health Coach will make monthly outreach attempt to patient/wife in the month of December   [place] [phone] Zanita [email or web address] .Patsi Sears

## 2017-07-04 DIAGNOSIS — E113293 Type 2 diabetes mellitus with mild nonproliferative diabetic retinopathy without macular edema, bilateral: Secondary | ICD-10-CM | POA: Diagnosis not present

## 2017-07-04 DIAGNOSIS — H52203 Unspecified astigmatism, bilateral: Secondary | ICD-10-CM | POA: Diagnosis not present

## 2017-07-04 DIAGNOSIS — H26492 Other secondary cataract, left eye: Secondary | ICD-10-CM | POA: Diagnosis not present

## 2017-07-04 DIAGNOSIS — Z961 Presence of intraocular lens: Secondary | ICD-10-CM | POA: Diagnosis not present

## 2017-07-17 DIAGNOSIS — E1165 Type 2 diabetes mellitus with hyperglycemia: Secondary | ICD-10-CM | POA: Diagnosis not present

## 2017-07-24 ENCOUNTER — Encounter: Payer: Self-pay | Admitting: *Deleted

## 2017-07-24 ENCOUNTER — Other Ambulatory Visit: Payer: Self-pay | Admitting: *Deleted

## 2017-07-24 NOTE — Patient Outreach (Signed)
[place] [facility]) Care Management  07/24/2017  Rawleigh Rode [facility] 12-29-1940 [id number]  RN Health Coach Monthly Outreach  Referral Date: 04/12/2017 Referral Source: MD Referral Reason for Referral: Disease Management Education Insurance: [facility] Medicare  Outreach Attempt: Successful telephone outreach to patient and wife.  Spoke with and confirmed HIPAA with wife per patient's request.  Wife states over the past few weeks patient's fasting blood sugars have been really elevated.  This morning's fasting blood sugar was 215 with fasting CBG's ranging in the high 200's.  Wife has already contacted the primary care provider and is awaiting test results and further instructions from the provider.  She also states she did not receive the information [facility] mailed out last month with list of Outpatient Therapy Offices and Living Well with Diabetes Booklet.  Information will be resent.  Appointments:  Wife reports follow up appointment with Dr. Sabra Heck in February 2019.  She does state patient is awaiting test results for possible medication adjustment by Dr. Sabra Heck for elevated blood sugars.   Plan:   RN Health Coach to resend Living Well with Diabetes Booklet. RN Health Coach to resend list of Outpatient Therapy Offices. RN Health Coach to resend [place]. RN Health Coach to send 2019 Calendar. RN Health Coach to make next monthly telephone outreach to patient in the month of January.   [place] [phone] [email or web address]

## 2017-08-23 ENCOUNTER — Other Ambulatory Visit: Payer: Self-pay | Admitting: *Deleted

## 2017-08-23 ENCOUNTER — Encounter: Payer: Self-pay | Admitting: *Deleted

## 2017-08-23 NOTE — Patient Outreach (Signed)
[facility]) Care Management  08/23/2017  Slater Mcmanaman [facility] [birth year] [id number]  RN Health Coach Monthly Outreach  Referral Date: 04/12/2017 Referral Source: MD Referral Reason for Referral: Disease Management Education Insurance: [facility] Medicare  Outreach Attempt:  Successful telephone outreach to patient for monthly follow up.  Spoke with patient's wife (Release of Information on file).  HIPAA confirmed with patient's wife.  Wife states patient is sleeping currently.  States he typically stays up all night and gets to bed around 6am in the morning, then sleeps most of the day.  She does states when he was working he worked night shift so this is a common schedule for him.  Wife reports monitoring patient's fasting blood sugars about every other day.  Has not taken CBG yet this morning due to patient sleeping.  Reports his blood sugars have still been elevated with ranges of 160-200's.  Per wife, patient has restarted his Amaryl per MD instructions.  Wife states she continues to review educational material and outpatient therapies mailed out to her.  Appointments:  Wife states patient has an appointment scheduled with Dr. Sabra Heck in February 2019  Encouraged wife to contact primary care office if CBG's continued to be.  Plan: RN Health Coach will make next monthly telephone outreach to patient/wife in the month of February.  [place] [phone] [email or web address]

## 2017-09-05 ENCOUNTER — Encounter: Payer: Self-pay | Admitting: *Deleted

## 2017-09-05 ENCOUNTER — Other Ambulatory Visit: Payer: Self-pay | Admitting: *Deleted

## 2017-09-05 NOTE — Patient Outreach (Signed)
[facility]) Care Management  09/05/2017  Ronald Dyer [facility] Mar 01, 1941 [id number]   CSW was able to make initial contact with patient's wife, Ronald Dyer today to perform phone assessment on patient, as well as assess and assist with social work needs and services.  CSW introduced self, explained role and types of services provided through [facility] ([place] [facility]).  CSW further explained to Mrs. Ronald Dyer that Ferdinand works with patient's Telephonic RNCM, also with [facility] [facility], Hubert Azure. CSW then explained the reason for the call, indicating that Mrs. Ronald Dyer thought that patient would benefit from social work services and resources to assist with referrals to various community agencies and resources.  CSW obtained two HIPAA compliant identifiers from Mrs. Ronald Dyer, which included patient's name and date of birth. Mrs. Ronald Dyer reported that things are going well for her and patient, at the moment, as she reports being able to better manage his care in the home.  Mrs. Ronald Dyer thought it might be helpful to receive information about Adult Day Programs, for future reference.  CSW agreed to mail the following information to patient's home: Brochure for Ronald Dyer, systems for Ronald ([facility] [place] [facility]) CSW will also mail a [employer] attorney and Consent for Treatment form.  CSW agreed to follow-up with Ronald Dyer in one week to ensure that she received the resource information.  Ronald Dyer voiced understanding and was agreeable to this plan. Ronald Dyer, BSW, MSW, LCSW  Licensed Education officer, environmental Health System  Mailing Ronald N. [address], [place], [place] [address] Physical Address-[address], [place], [place] [address] Toll Free Main # [phone] Fax # [phone] Cell #  [phone]  Office # [phone] [email or web address]

## 2017-09-05 NOTE — Patient Outreach (Signed)
[facility]) Care Management  09/05/2017  Ronald Dyer [facility] 1940/09/14 [id number]   RN Health Coach Follow up Outreach  Referral Date: 04/12/2017 Referral Source: MD Referral Reason for Referral: Disease Management Education Insurance: [facility] Medicare  Outreach Attempt:  Successful telephone outreach to patient's wife for her interest in possible adult day care services for patient.  Wife stated during last conversation patients increase in sleeping during the day and staying up at night.  Wife also feeling a little overwhelmed with having to care for patient full time and inability to run errands without having to take him with her.  Discussed [address] Work referral and wife verbally agrees.  Plan:  RN Health Coach will place [facility] Social Work referral for adult day care and respite options.  RN Health Coach will make next monthly outreach in the month of February.  [place] [phone] [email or web address]

## 2017-09-06 NOTE — Patient Outreach (Signed)
Request received from Joanna Saporito, LCSW to mail patient personal care resources.  Information mailed today. 

## 2017-09-12 ENCOUNTER — Other Ambulatory Visit: Payer: Self-pay | Admitting: *Deleted

## 2017-09-12 ENCOUNTER — Ambulatory Visit: Payer: Commercial Managed Care - HMO | Admitting: *Deleted

## 2017-09-12 NOTE — Patient Outreach (Signed)
[facility]) Care Management  09/12/2017  Gerson Fauth [facility] 1940-12-14 [id number]   CSW was able to make contact with patient's wife, Zeplin Aleshire today to follow-up regarding social work services and resources, as well as to ensure that she received the packet of resource information mailed to her by CSW.  Mrs. Nettleton confirmed that she received the packet and has already had an opportunity to review the information with patient.  Mrs. Cisse reported that she is not quite ready to place patient in an adult day care program, but she will have all the information at her disposal when she is ready.  Mrs. [place] denied being able to identify any additional social work needs at present.  CSW was able to ensure that Mrs. [place] has the correct contact information for CSW, encouraging her to contact CSW directly if additional social work needs arise in the near future.  Mrs. [place] voiced understanding and was agreeable to this plan. CSW will perform a case closure on patient, as all goals of treatment have been met from social work standpoint and no additional social work needs have been identified at this time.  CSW will notify patient's RNCM with [facility], Hubert Azure of CSW's plans to close patient's case.  CSW will fax an update to patient's Primary Care Physician, Dr. [name] to ensure that they are aware of CSW's involvement with patient's plan of care.  CSW will submit a case closure request to Alycia Rossetti, Care Management Assistant with [facility], in the form of an [employer].  CSW will ensure that Mrs. Arelia Sneddon is aware of Mrs. Tarpley's, RNCM with [facility], continued involvement with patient's care. Nat Christen, BSW, MSW, LCSW  Licensed Education officer, environmental Health System  Mailing [place] N.  [address] [place], [place] [address] Physical Address-[address], [place], [place] [address] Toll Free Main # [phone] Fax # [phone] Cell # [phone]  Office # [phone] [email or web address]

## 2017-09-21 ENCOUNTER — Other Ambulatory Visit: Payer: Self-pay | Admitting: *Deleted

## 2017-09-21 ENCOUNTER — Encounter: Payer: Self-pay | Admitting: *Deleted

## 2017-09-21 DIAGNOSIS — E113213 Type 2 diabetes mellitus with mild nonproliferative diabetic retinopathy with macular edema, bilateral: Secondary | ICD-10-CM | POA: Diagnosis not present

## 2017-09-21 DIAGNOSIS — R413 Other amnesia: Secondary | ICD-10-CM | POA: Diagnosis not present

## 2017-09-21 DIAGNOSIS — I6930 Unspecified sequelae of cerebral infarction: Secondary | ICD-10-CM | POA: Diagnosis not present

## 2017-09-21 DIAGNOSIS — E7800 Pure hypercholesterolemia, unspecified: Secondary | ICD-10-CM | POA: Diagnosis not present

## 2017-09-21 DIAGNOSIS — I10 Essential (primary) hypertension: Secondary | ICD-10-CM | POA: Diagnosis not present

## 2017-09-21 NOTE — Patient Outreach (Signed)
[name]) Care Management  09/21/2017  Ronald Dyer [facility] [birth year] [id number]   RN Health Coach Monthly Outreach  Referral Date: 04/12/2017 Referral Source: MD Referral Reason for Referral: Disease Management Education Insurance: [facility] Medicare   Outreach Attempt:  Outreach attempt #1 to patient for monthly follow up.  Wife answered and stated she was just walking in the house.  Requesting a call back.  Plan:  RN Health Coach will attempt another telephone outreach within 5 business days.  [place] [phone] Mirranda [email or web address]

## 2017-09-21 NOTE — Patient Outreach (Signed)
[facility]) Care Management  [place]  09/21/2017   Alif Petrak [facility] 1941/01/27 [id number]   RN Health Coach Monthly Outreach   Referral Date: 04/12/2017 Referral Source: MD Referral Reason for Referral: Disease Management Education Insurance: [facility] Medicare   Outreach Attempt:  Successful telephone outreach to patient for monthly follow up.  HIPAA verified with wife per patient previous request.  Wife states patient is doing better with his socialization with their family.  Reports his speech and walking is getting some better.  Denies any recent falls.  Continues to monitor his blood sugars about every other day.  Fasting CBG ranges have been 140-170's, with one reading in the 300's.  Continues to monitor his blood pressures and reports no significant elevations.  Wife states patient continues to sleep most of the day and is up most of the night.  Encouraged wife to increase patient's socialization during the daytime.  Verbalizes she did receive the list of Adult Daycare options and will use them in the future if needed.  Wife also stating she is due to have eye surgery soon and will seek the assistance of her daughter and granddaughter to care for patient.    Encounter Medications:  Outpatient Encounter Medications as of 09/21/2017  Medication Sig Note  . aspirin 81 MG tablet Take 81 mg by mouth daily. 06/22/2017: Taking 325 mg  . atorvastatin (LIPITOR) 10 MG tablet Take 10 mg by mouth 2 (two) times a week.   . donepezil (ARICEPT) 5 MG tablet Take 5 mg by mouth at bedtime.   . hydrochlorothiazide (HYDRODIURIL) 25 MG tablet Take 25 mg by mouth daily.   Marland Kitchen lisinopril (PRINIVIL,ZESTRIL) 20 MG tablet Take 20 mg by mouth daily. 06/22/2017: 20 mg tablets takes 2 tablets twice a day  . Multiple Vitamins-Minerals (MULTIVITAMIN WITH MINERALS) tablet Take 1 tablet by mouth daily.   Marland Kitchen omeprazole (PRILOSEC) 40 MG capsule Take 40 mg by mouth daily.   . pioglitazone (ACTOS)  45 MG tablet Take 45 mg by mouth daily.   . sertraline (ZOLOFT) 100 MG tablet Take 100 mg by mouth daily.   . vitamin B-12 (CYANOCOBALAMIN) 100 MCG tablet Take 50 mcg by mouth daily.   Marland Kitchen GAVILYTE-N WITH FLAVOR PACK 420 G solution Take 420 g by mouth daily. 06/22/2017: Not Taking  . glimepiride (AMARYL) 4 MG tablet 4 mg Daily. 06/22/2017: Not taking per wife  . metFORMIN (GLUCOPHAGE) 1000 MG tablet 1,000 mg. 1 1/2 table am and 1 tablet @@ bedtime 06/22/2017: Per wife doctor discontinued due to diarrhea  . simvastatin (ZOCOR) 80 MG tablet Take 80 mg by mouth. Every evening 06/22/2017: Not taking per wife   No facility-administered encounter medications on file as of 09/21/2017.     Functional Status:  In your present state of health, do you have any difficulty performing the following activities: 09/05/2017 08/23/2017  Hearing? Y Y  Comment - mild hearing loss  Vision? N N  Difficulty concentrating or making decisions? Tempie Donning  Walking or climbing stairs? Y Y  Comment - recent stroke, slow gait  Dressing or bathing? N N  Doing errands, shopping? Tempie Donning  Preparing Food and eating ? Y Y  Using the Toilet? N N  In the past six months, have you accidently leaked urine? N N  Do you have problems with loss of bowel control? N N  Managing your Medications? Y Y  Managing your Finances? Tempie Donning  Housekeeping or managing your Housekeeping? Tempie Donning  Some recent data might be hidden    Fall/Depression Screening: Fall Risk  09/05/2017 08/23/2017 06/22/2017  Falls in the past year? No No No  Comment - - -  Risk for fall due to : Impaired balance/gait;Impaired mobility Impaired balance/gait;Impaired mobility Impaired balance/gait   PHQ 2/9 Scores 09/05/2017 08/23/2017 04/20/2017 04/18/2017 07/09/2014  PHQ - 2 Score 2 - 2 2 0  PHQ- 9 Score 3 - 5 5 -  Exception Documentation - Other- indicate reason in comment box - - -  Not completed - Spoke with patient's wife, unable to complete screening - - -    THN CM Care Plan  Problem One     Most Recent Value  Care Plan Problem One  Knowledge deficit regarding self-management of Diabetes.  Role Documenting the Problem One  [place] for Problem One  Active  THN Long Term Goal   Wife will verbalize patient's hgb A1C has decreased by 1/2 point in the next 60 days.  THN Long Term Goal Start Date  09/21/17  THN Long Term Goal Met Date  09/21/17  Interventions for Problem One Long Term Goal  Reviewed and discussed current care plan with wife,  encouraged wife to continue to monitor blood sugars every other day and to notify physician for elevations, reviewed and discussed Living Well with Diabetes booklet with wife, encouraged healthier meal options for patient to help reduce blood sugars,  encroauged wife to asssit patient in increasing his activity,   THN CM Short Term Goal #1   Wife will verbalize patient continues to socialize with family in the next 30 days  THN CM Short Term Goal #1 Start Date  09/21/17  [facility] CM Short Term Goal #1 Met Date  09/21/17  Interventions for Short Term Goal #1  Discussed with wife socialization of patient could increase verbal cues,  encouraged wife to enroll patient in some adult day cares to increase socialization with others, encouraged wife to increase patient's time he is up during the day to allow patient to sleep more at night to get him on a regular sleep wake schedule to help with socialization  THN CM Short Term Goal #2   Wife will verbalize 3 symptoms of hypoglycemia in the next 30 days.  THN CM Short Term Goal #2 Start Date  09/21/17  Interventions for Short Term Goal #2  Reviewed with wife signs and symptoms of hypoglycemia utilizing teachback to have wife state signs and symptoms back to [employer], discussed with wife signs of hypoglycemia mimicing stroke like symptoms, encouraged wife to monitor patient for signs and symptoms of hypoglycemia,  discussed with wife why it is important to recongnize hypoglycemic  events,  encouraged wife to continue to review Living Well with Diabetes Booklet and mark down any questions she may have  THN CM Short Term Goal #3  Patient's wife will contact Outpatient therapy centers within the next 30 days.  THN CM Short Term Goal #3 Start Date  09/05/17  Interventions for Short Tern Goal #3  encoursged wife to review list and contact them when they are able to afford some therapy sessions      Appointments:   Patient just returning from his medical appointment with his primary care provider, Dr. Sabra Heck today.  His next appointment is scheduled for April 2019 for his yearly physical.  Plan:  [place] will make next monthly telephone outreach to patient and wife in the month of March.  RN  Health Coach to send primary MD Quarterly Update.   [place] [phone] Zakeria [email or web address]

## 2017-09-28 DIAGNOSIS — R944 Abnormal results of kidney function studies: Secondary | ICD-10-CM | POA: Diagnosis not present

## 2017-10-01 ENCOUNTER — Other Ambulatory Visit: Payer: Self-pay | Admitting: Family Medicine

## 2017-10-01 DIAGNOSIS — N289 Disorder of kidney and ureter, unspecified: Secondary | ICD-10-CM

## 2017-10-02 DIAGNOSIS — R8290 Unspecified abnormal findings in urine: Secondary | ICD-10-CM | POA: Diagnosis not present

## 2017-10-04 ENCOUNTER — Ambulatory Visit
Admission: RE | Admit: 2017-10-04 | Discharge: 2017-10-04 | Disposition: A | Discharge status: Home / Self Care | Payer: Commercial Managed Care - HMO | Source: Ambulatory Visit | Attending: Family Medicine | Admitting: Family Medicine

## 2017-10-04 DIAGNOSIS — R3129 Other microscopic hematuria: Secondary | ICD-10-CM | POA: Diagnosis not present

## 2017-10-04 DIAGNOSIS — N289 Disorder of kidney and ureter, unspecified: Secondary | ICD-10-CM

## 2017-10-09 DIAGNOSIS — R972 Elevated prostate specific antigen [PSA]: Secondary | ICD-10-CM | POA: Diagnosis not present

## 2017-10-24 ENCOUNTER — Ambulatory Visit: Payer: Self-pay | Admitting: *Deleted

## 2017-10-31 ENCOUNTER — Other Ambulatory Visit: Payer: Self-pay | Admitting: *Deleted

## 2017-10-31 ENCOUNTER — Encounter: Payer: Self-pay | Admitting: *Deleted

## 2017-10-31 NOTE — Patient Outreach (Signed)
[facility]) Care Management  10/31/2017  [name] 03/27/1941 [id number]   RN Health Coach Monthly Outreach  Referral Date: 04/12/2017 Referral Source: MD Referral Reason for Referral: Disease Management Education Insurance: [facility] Medicare   Outreach Attempt:  Successful telephone outreach to patient for monthly follow up.  Patient answered phone and stated wife was not home.  HIPAA verified with patient.  Patient states he is feeling better and trying to stay awake more during the daytime and socialize more when around people.  Verbalizes his wife continues to manage his medications and taking his blood sugars about every other day.  Feels his speech is some better but continues to have a "stutter".  States he is ambulating independently.  Verbalizes when his blood sugar is low his balance is off and states he has not had any hypoglycemic episodes in a while.  In the middle of conversation, patient stated he was "tired" and request RN Health Coach speak with wife.  Appointments:   Patient last seen primary care provider on 09/21/2017 and next appointment is scheduled in April 2019.  Plan: RN Health Coach requested patient's wife return call. RN Health Coach will make next months telephone outreach in the month of April.  [place] [phone] Adonis [email or web address]

## 2017-11-26 ENCOUNTER — Other Ambulatory Visit: Payer: Self-pay | Admitting: *Deleted

## 2017-11-26 ENCOUNTER — Encounter: Payer: Self-pay | Admitting: *Deleted

## 2017-11-26 NOTE — Patient Outreach (Signed)
[facility]) Care Management  11/26/2017  Ronald Dyer [facility] 1940-11-14 [id number]   RN Health Coach Monthly Outreach  Referral Date: 04/12/2017 Referral Source: MD Referral Reason for Referral: Disease Management Education Insurance: [facility] Medicare   Outreach Attempt:  Successful telephone outreach to patient for monthly follow up.  HIPAA verified with patient's wife (ROI on file).  Wife states patient doing well.  Continues to sleep most of the day and is up most of the night.  Verbalizes he continues to be a little more interactive and socialize with others.  Wife reports monitoring blood sugars about 2-3 times a week.  CBG this morning 190 with recent ranges in the 140-170's.  Denies any noticeable episodes of hypo or hyperglycemia.  Denies any falls or issues with nutrition.  Discussed with wife patient nearing goals of program if Hgb A1C is decreasing.  Appointments:  Patient has scheduled physical appointment with Dr. Sabra Heck on 12/05/2017 and scheduled Urology appointment on 12/03/2017.  Encouraged wife to keep and attend these appointments.  Plan:  RN Health Coach will make next monthly telephone outreach to patient in the month of May.  [place] [phone] [email or web address]

## 2017-12-03 DIAGNOSIS — N401 Enlarged prostate with lower urinary tract symptoms: Secondary | ICD-10-CM | POA: Diagnosis not present

## 2017-12-03 DIAGNOSIS — R351 Nocturia: Secondary | ICD-10-CM | POA: Diagnosis not present

## 2017-12-03 DIAGNOSIS — R972 Elevated prostate specific antigen [PSA]: Secondary | ICD-10-CM | POA: Diagnosis not present

## 2017-12-03 DIAGNOSIS — R310 Gross hematuria: Secondary | ICD-10-CM | POA: Diagnosis not present

## 2017-12-05 DIAGNOSIS — Z0000 Encounter for general adult medical examination without abnormal findings: Secondary | ICD-10-CM | POA: Diagnosis not present

## 2017-12-05 DIAGNOSIS — E7800 Pure hypercholesterolemia, unspecified: Secondary | ICD-10-CM | POA: Diagnosis not present

## 2017-12-05 DIAGNOSIS — Z8673 Personal history of transient ischemic attack (TIA), and cerebral infarction without residual deficits: Secondary | ICD-10-CM | POA: Diagnosis not present

## 2017-12-05 DIAGNOSIS — R413 Other amnesia: Secondary | ICD-10-CM | POA: Diagnosis not present

## 2017-12-05 DIAGNOSIS — N289 Disorder of kidney and ureter, unspecified: Secondary | ICD-10-CM | POA: Diagnosis not present

## 2017-12-05 DIAGNOSIS — I10 Essential (primary) hypertension: Secondary | ICD-10-CM | POA: Diagnosis not present

## 2017-12-05 DIAGNOSIS — Z6829 Body mass index (BMI) 29.0-29.9, adult: Secondary | ICD-10-CM | POA: Diagnosis not present

## 2017-12-05 DIAGNOSIS — E113213 Type 2 diabetes mellitus with mild nonproliferative diabetic retinopathy with macular edema, bilateral: Secondary | ICD-10-CM | POA: Diagnosis not present

## 2017-12-05 DIAGNOSIS — Z1389 Encounter for screening for other disorder: Secondary | ICD-10-CM | POA: Diagnosis not present

## 2017-12-24 DIAGNOSIS — N289 Disorder of kidney and ureter, unspecified: Secondary | ICD-10-CM | POA: Diagnosis not present

## 2017-12-24 DIAGNOSIS — Z8673 Personal history of transient ischemic attack (TIA), and cerebral infarction without residual deficits: Secondary | ICD-10-CM | POA: Diagnosis not present

## 2017-12-24 DIAGNOSIS — E7800 Pure hypercholesterolemia, unspecified: Secondary | ICD-10-CM | POA: Diagnosis not present

## 2017-12-24 DIAGNOSIS — R413 Other amnesia: Secondary | ICD-10-CM | POA: Diagnosis not present

## 2017-12-24 DIAGNOSIS — Z6829 Body mass index (BMI) 29.0-29.9, adult: Secondary | ICD-10-CM | POA: Diagnosis not present

## 2017-12-24 DIAGNOSIS — E113213 Type 2 diabetes mellitus with mild nonproliferative diabetic retinopathy with macular edema, bilateral: Secondary | ICD-10-CM | POA: Diagnosis not present

## 2017-12-24 DIAGNOSIS — I10 Essential (primary) hypertension: Secondary | ICD-10-CM | POA: Diagnosis not present

## 2017-12-27 ENCOUNTER — Encounter: Payer: Self-pay | Admitting: *Deleted

## 2017-12-27 ENCOUNTER — Other Ambulatory Visit: Payer: Self-pay | Admitting: *Deleted

## 2017-12-27 NOTE — Patient Outreach (Addendum)
[place] [facility]) Care Management  [place]  12/27/2017   Denham Mose [facility] 21-Aug-1941 [id number]   RN Health Coach Monthly Outreach  Referral Date: 04/12/2017 Referral Source: MD Referral Reason for Referral: Disease Management Education Insurance: [facility] Medicare   Outreach Attempt: Successful telephone outreach to patient's wife for monthly follow up.  HIPAA verified with wife.  Wife states patient is doing well.  Reports last Hgb A1C continued to be elevated and they are awaiting result from blood draw this week.  Hgb A1C 8.9 on 12/24/2017.  With increasing A1C, wife does report fasting blood sugars have now begun to decrease.  Yesterday's fasting blood sugar was 113 with ranges of 110-140's.  Denies any hypoglycemic episodes or significant hyperglycemia.  Reports patient is increasing his exercise with walking outside when weather permits and reducing the number of sodas he is drinking.  Encouraged to continue with increase in exercise, socialization, and dietary changes to help reduce Hgb A1C.   Encounter Medications:  Outpatient Encounter Medications as of 12/27/2017  Medication Sig Note  . aspirin 81 MG tablet Take 81 mg by mouth daily. 06/22/2017: Taking 325 mg  . atorvastatin (LIPITOR) 10 MG tablet Take 10 mg by mouth 2 (two) times a week.   . donepezil (ARICEPT) 5 MG tablet Take 5 mg by mouth at bedtime.   . hydrochlorothiazide (HYDRODIURIL) 25 MG tablet Take 25 mg by mouth daily.   Marland Kitchen lisinopril (PRINIVIL,ZESTRIL) 20 MG tablet Take 20 mg by mouth daily. 06/22/2017: 20 mg tablets takes 2 tablets twice a day  . Multiple Vitamins-Minerals (MULTIVITAMIN WITH MINERALS) tablet Take 1 tablet by mouth daily.   Marland Kitchen omeprazole (PRILOSEC) 40 MG capsule Take 40 mg by mouth daily.   . pioglitazone (ACTOS) 45 MG tablet Take 45 mg by mouth daily.   . sertraline (ZOLOFT) 100 MG tablet Take 100 mg by mouth daily.   . vitamin B-12 (CYANOCOBALAMIN) 100 MCG tablet Take 50 mcg by  mouth daily.   Marland Kitchen GAVILYTE-N WITH FLAVOR PACK 420 G solution Take 420 g by mouth daily. 06/22/2017: Not Taking  . glimepiride (AMARYL) 4 MG tablet 4 mg Daily. 06/22/2017: Not taking per wife  . metFORMIN (GLUCOPHAGE) 1000 MG tablet 1,000 mg. 1 1/2 table am and 1 tablet @@ bedtime 06/22/2017: Per wife doctor discontinued due to diarrhea  . simvastatin (ZOCOR) 80 MG tablet Take 80 mg by mouth. Every evening 06/22/2017: Not taking per wife   No facility-administered encounter medications on file as of 12/27/2017.     Functional Status:  In your present state of health, do you have any difficulty performing the following activities: 09/05/2017 08/23/2017  Hearing? Y Y  Comment - mild hearing loss  Vision? N N  Difficulty concentrating or making decisions? Tempie Donning  Walking or climbing stairs? Y Y  Comment - recent stroke, slow gait  Dressing or bathing? N N  Doing errands, shopping? Tempie Donning  Preparing Food and eating ? Y Y  Using the Toilet? N N  In the past six months, have you accidently leaked urine? N N  Do you have problems with loss of bowel control? N N  Managing your Medications? Y Y  Managing your Finances? Tempie Donning  Housekeeping or managing your Housekeeping? Y Y  Some recent data might be hidden    Fall/Depression Screening: Fall Risk  12/27/2017 11/26/2017 09/05/2017  Falls in the past year? No No No  Comment - - -  Risk for fall due to : -  Impaired balance/gait;Impaired mobility Impaired balance/gait;Impaired mobility   PHQ 2/9 Scores 09/05/2017 08/23/2017 04/20/2017 04/18/2017 07/09/2014  PHQ - 2 Score 2 - 2 2 0  PHQ- 9 Score 3 - 5 5 -  Exception Documentation - Other- indicate reason in comment box - - -  Not completed - Spoke with patient's wife, unable to complete screening - - -   THN CM Care Plan Problem One     Most Recent Value  Care Plan Problem One  Knowledge deficit regarding self-management of Diabetes.  Role Documenting the Problem One  [place] for Problem One  Active   THN Long Term Goal   Wife will verbalize patient's hgb A1C has decreased by 1/2 point in the next [place] Term Goal Start Date  12/27/17  Interventions for Problem One Long Term Goal  Reviewed and discussed current care plan with wife, discussed healthier meal and drink options, encouraged a reduction in intake of carbonated drinks and an increase in water intake, discussed low carbohydrate diet, encouraged continued incease in exercise, medications reviewed and encouraged medication compliance  THN CM Short Term Goal #1   Patient and wife will report patient ambulating outside at least 3 days a week in the next 30 days (including walking in neighborhood/mall/[employer])  THN CM Short Term Goal #1 Start Date  12/27/17  [facility] CM Short Term Goal #1 Met Date  12/27/17  Interventions for Short Term Goal #1  Falls precautions reviewed and discussed with wife, congratulated on increase in acitivity so far and encouraged continued activity/exercise, encouraged use of cane or walker with increase in walking/exercise to prevent falls, encouraged more activity during the daytime to allow for sleeping at night, encouraged increased socialization for patient  [facility] CM Short Term Goal #2   Patient and wife will report fasting blood sugars <170 in the next 30 days.  THN CM Short Term Goal #2 Start Date  12/27/17  [facility] CM Short Term Goal #2 Met Date  12/27/17  Interventions for Short Term Goal #2  Discussed current Hgb A1C increase, discussed healtheir meal and drink options, encouraged continued monitoring of blood sugars and to notify physician with significant sustained elevations, congratulated on current reduction of fasting glucose ranges, reviewed signs and symptoms of hypo and hyperglycemia, encouraged continue review of Living Well with Diabetes Booklet to assist with diabetes knowledge      Appointments:  Reports patient seen primary care provider, Dr. Sabra Heck on 12/05/2017 and will follow up in 6  months.  Plan:  RN Health Coach will make next monthly outreach to patient in the month of June.  RN Health Coach will send primary care provider Quarterly Update.   [place] [phone] .[email or web address]

## 2018-01-24 ENCOUNTER — Ambulatory Visit: Payer: Self-pay | Admitting: *Deleted

## 2018-01-25 ENCOUNTER — Other Ambulatory Visit: Payer: Self-pay | Admitting: *Deleted

## 2018-01-25 ENCOUNTER — Encounter: Payer: Self-pay | Admitting: *Deleted

## 2018-01-25 NOTE — Patient Outreach (Signed)
[place] [facility]) Care Management  01/25/2018  Ronald Dyer [facility] 1941/02/14 [id number]   RN Health Coach Monthly Outreach  Referral Date: 04/12/2017 Referral Source: MD Referral Reason for Referral: Disease Management Education Insurance: [facility] Medicare   Outreach Attempt:  Successful telephone outreach to patient's wife for monthly follow up.  HIPAA verified with wife, ROI on file.  Wife states patient is doing well.  States physician did call and start patient on Januvia.  States patient has been taking new medication since 01/15/2018.  Reports blood sugars have been well controlled since then.  Continues to monitor blood sugars every other day.  Last fasting blood sugar ws 103 on 01/22/2018, with ranges of 83-110's.  Denies any hypo or hyperglycemic events.  Appointments:   Patient last attended medical appointment on 12/05/2017 and will follow up with primary care provider in September 2019.  Plan:  RN Health Coach will make next monthly outreach to patient in the month of July.   [place] [phone] [name]

## 2018-03-04 ENCOUNTER — Encounter: Payer: Self-pay | Admitting: *Deleted

## 2018-03-04 ENCOUNTER — Other Ambulatory Visit: Payer: Self-pay | Admitting: *Deleted

## 2018-03-04 DIAGNOSIS — Z961 Presence of intraocular lens: Secondary | ICD-10-CM | POA: Diagnosis not present

## 2018-03-04 DIAGNOSIS — E113291 Type 2 diabetes mellitus with mild nonproliferative diabetic retinopathy without macular edema, right eye: Secondary | ICD-10-CM | POA: Diagnosis not present

## 2018-03-04 DIAGNOSIS — H26491 Other secondary cataract, right eye: Secondary | ICD-10-CM | POA: Diagnosis not present

## 2018-03-04 NOTE — Patient Outreach (Signed)
[name] Care Management  03/04/2018  Ronald Dyer [name] 1940/10/10 [id number]   RN Health Coach Monthly Outreach  Referral Date: 04/12/2017 Referral Source: MD Referral Reason for Referral: Disease Management Education Insurance: [facility] Medicare   Outreach Attempt:  Successful telephone outreach to patient's wife for monthly follow up.  HIPAA verified with wife (Release of Information on file).  Wife reporting patient is doing well.  Reports his fasting blood sugars are better controlled.  Last blood sugar was 126 with fasting ranges of 80-120's since initiation of Januvia.  Denies any falls, or significant hypo or hyperglycemic events.   Continues to walk in the neighborhood daily, as weather permits.  Discussed with wife changing outreach calls to every other month, wife verbally agrees.  Appointments:   Patient last attended medical appointment on 12/05/2017 and has follow up appointment in September 2019.  Reports having eye exam today and scheduled Urologist appointment next week on 03/13/2018.  Plan: RN Health Coach will make next monthly outreach to patient in the month of September.  [name] [phone] [name]

## 2018-03-13 DIAGNOSIS — R310 Gross hematuria: Secondary | ICD-10-CM | POA: Diagnosis not present

## 2018-03-13 DIAGNOSIS — R972 Elevated prostate specific antigen [PSA]: Secondary | ICD-10-CM | POA: Diagnosis not present

## 2018-04-16 DIAGNOSIS — R972 Elevated prostate specific antigen [PSA]: Secondary | ICD-10-CM | POA: Diagnosis not present

## 2018-04-16 DIAGNOSIS — Z6830 Body mass index (BMI) 30.0-30.9, adult: Secondary | ICD-10-CM | POA: Diagnosis not present

## 2018-04-16 DIAGNOSIS — R413 Other amnesia: Secondary | ICD-10-CM | POA: Diagnosis not present

## 2018-04-16 DIAGNOSIS — I6930 Unspecified sequelae of cerebral infarction: Secondary | ICD-10-CM | POA: Diagnosis not present

## 2018-04-16 DIAGNOSIS — E1122 Type 2 diabetes mellitus with diabetic chronic kidney disease: Secondary | ICD-10-CM | POA: Diagnosis not present

## 2018-04-16 DIAGNOSIS — F325 Major depressive disorder, single episode, in full remission: Secondary | ICD-10-CM | POA: Diagnosis not present

## 2018-04-16 DIAGNOSIS — E7800 Pure hypercholesterolemia, unspecified: Secondary | ICD-10-CM | POA: Diagnosis not present

## 2018-04-16 DIAGNOSIS — N183 Chronic kidney disease, stage 3 (moderate): Secondary | ICD-10-CM | POA: Diagnosis not present

## 2018-04-29 DIAGNOSIS — N419 Inflammatory disease of prostate, unspecified: Secondary | ICD-10-CM | POA: Diagnosis not present

## 2018-04-29 DIAGNOSIS — R972 Elevated prostate specific antigen [PSA]: Secondary | ICD-10-CM | POA: Diagnosis not present

## 2018-05-13 ENCOUNTER — Encounter: Payer: Self-pay | Admitting: *Deleted

## 2018-05-13 ENCOUNTER — Other Ambulatory Visit: Payer: Self-pay | Admitting: *Deleted

## 2018-05-13 NOTE — Patient Outreach (Signed)
[place] [facility]) Care Management  [place]  05/13/2018   Laden Fieldhouse [facility] April 13, 1941 [id number]   RN Health Coach Monthly Outreach   Referral Date: 04/12/2017 Referral Source: MD Referral Reason for Referral: Disease Management Education Insurance: [facility] Medicare   Outreach Attempt:  Successful telephone outreach to patient's wife for monthly follow up.  HIPAA verified with wife.  Wife is stating patient is doing well.  Latest Hgb A1C was reduced to 6.5.  Fasting blood sugar this morning was 94 with fasting ranges of 90-120's.  Reports blood pressures have been within range.  Denies any recent sick days.  Does report patient recently had a prostate biopsy that was negative.  Wife also reports patient continues to walk daily for exercise and is more awake during the daytime.  Encounter Medications:  Outpatient Encounter Medications as of 05/13/2018  Medication Sig Note  . aspirin 81 MG tablet Take 81 mg by mouth daily. 06/22/2017: Taking 325 mg  . atorvastatin (LIPITOR) 10 MG tablet Take 10 mg by mouth 2 (two) times a week.   . donepezil (ARICEPT) 5 MG tablet Take 5 mg by mouth at bedtime.   . hydrochlorothiazide (HYDRODIURIL) 25 MG tablet Take 25 mg by mouth daily.   Marland Kitchen lisinopril (PRINIVIL,ZESTRIL) 20 MG tablet Take 20 mg by mouth daily. 06/22/2017: 20 mg tablets takes 2 tablets twice a day  . Multiple Vitamins-Minerals (MULTIVITAMIN WITH MINERALS) tablet Take 1 tablet by mouth daily.   Marland Kitchen omeprazole (PRILOSEC) 40 MG capsule Take 40 mg by mouth daily.   . pioglitazone (ACTOS) 45 MG tablet Take 45 mg by mouth daily.   . sertraline (ZOLOFT) 100 MG tablet Take 100 mg by mouth daily.   . sitaGLIPtin (JANUVIA) 100 MG tablet Take 100 mg by mouth daily.   . vitamin B-12 (CYANOCOBALAMIN) 100 MCG tablet Take 50 mcg by mouth daily.   Marland Kitchen GAVILYTE-N WITH FLAVOR PACK 420 G solution Take 420 g by mouth daily. 06/22/2017: Not Taking  . glimepiride (AMARYL) 4 MG tablet 4 mg Daily.  06/22/2017: Not taking per wife  . metFORMIN (GLUCOPHAGE) 1000 MG tablet 1,000 mg. 1 1/2 table am and 1 tablet @@ bedtime 06/22/2017: Per wife doctor discontinued due to diarrhea  . simvastatin (ZOCOR) 80 MG tablet Take 80 mg by mouth. Every evening 06/22/2017: Not taking per wife   No facility-administered encounter medications on file as of 05/13/2018.     Functional Status:  In your present state of health, do you have any difficulty performing the following activities: 05/13/2018 09/05/2017  Hearing? Y Y  Comment mild hearing loss -  Vision? N N  Difficulty concentrating or making decisions? Y Y  Comment forgetful, residual aphasia -  Walking or climbing stairs? 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 N N  Doing errands, shopping? Tempie Donning  Preparing Food and eating ? Y Y  Using the Toilet? N N  In the past six months, have you accidently leaked urine? N N  Do you have problems with loss of bowel control? N N  Managing your Medications? Y Y  Managing your Finances? Tempie Donning  Housekeeping or managing your Housekeeping? Y Y  Some recent data might be hidden    Fall/Depression Screening: Fall Risk  05/13/2018 03/04/2018 12/27/2017  Falls in the past year? No No No  Comment - - -  Risk for fall due to : - Impaired balance/gait;Impaired mobility -  PHQ 2/9 Scores 05/13/2018 09/05/2017 08/23/2017 04/20/2017 04/18/2017 07/09/2014  PHQ - 2 Score - 2 - 2 2 0  PHQ- 9 Score - 3 - 5 5 -  Exception Documentation Other- indicate reason in comment box - Other- indicate reason in comment box - - -  Not completed Spoke with wife, did not speak with patient - Spoke with patient's wife, unable to complete screening - - -   THN CM Care Plan Problem One     Most Recent Value  Care Plan Problem One  Knowledge deficit regarding self-management of Diabetes.  Role Documenting the Problem One  [place] for Problem One  Active  [facility] Long Term  Goal   Patient will maintain a Hgb A1C of below 7 in the next 6 months.  THN Long Term Goal Start Date  05/13/18  THN Long Term Goal Met Date  05/13/18  Interventions for Problem One Long Term Goal  Congratulated patient and wife on reduced A1C down to 6.5, encourged continued healthier food and drink choices, reviewed medications and encouraged compliance, encouraged to keep and attend medical appointments, encouraged patient to continue to walk daily for exercise, discussed importance of maintaining normal sleep and awake schedule to help patient socialize during the daytime  THN CM Short Term Goal #1   Patient will attend medical appointment on first week of September.  THN CM Short Term Goal #1 Start Date  03/04/18  [facility] CM Short Term Goal #1 Met Date  05/13/18     Appointments:  Patient attended appointment with primary care provider, Dr. Sabra Heck on 04/16/2018 and follow up appointment in March 2020.  Plan: RN Health Coach will send primary care provider Quarterly Update. RN Health Coach will make next telephone outreach in the month of December.  [place] [phone] Jaheem [email or web address]

## 2018-07-25 ENCOUNTER — Ambulatory Visit: Payer: Self-pay | Admitting: *Deleted

## 2018-07-30 ENCOUNTER — Other Ambulatory Visit: Payer: Self-pay | Admitting: *Deleted

## 2018-07-30 NOTE — Patient Outreach (Signed)
[facility] [facility]) Care Management  07/30/2018  Dequan [facility] 1940/09/07 [id number]   RN Health Coach Monthly Outreach  Referral Date: 04/12/2017 Referral Source: MD Referral Reason for Referral: Disease Management Education Insurance: [facility] Medicare   Outreach Attempt:  Outreach attempt #1 to patient for quarterly follow up.  Phone line picked up without response and line then hung up.   Plan:  RN Health Coach will make another outreach attempt within the month of December.  [place] [phone] Nayvie [email or web address]

## 2018-08-19 ENCOUNTER — Other Ambulatory Visit: Payer: Self-pay | Admitting: *Deleted

## 2018-08-19 NOTE — Patient Outreach (Signed)
[place] [facility]) Care Management  08/19/2018  Ronald Dyer [facility] [birth year] [id number]   RN Health Coach Quarterly Outreach  Referral Date: 04/12/2017 Referral Source: MD Referral Reason for Referral: Disease Management Education Insurance: [facility] Medicare   Outreach Attempt:  Outreach attempt #2 to patient for quarterly follow up. No answer and unable to leave voicemail message due to voice mail not engaging.  Plan:  RN Health Coach will make another outreach attempt within the month of January.  [place] [phone] Toshiye [email or web address]

## 2018-09-02 ENCOUNTER — Encounter: Payer: Self-pay | Admitting: *Deleted

## 2018-09-02 ENCOUNTER — Other Ambulatory Visit: Payer: Self-pay | Admitting: *Deleted

## 2018-09-02 NOTE — Patient Outreach (Signed)
[place] [facility]) Care Management  [facility] Care Manager  09/02/2018   Ronald Dyer [facility] 04-04-1941 [id number]   RN Health Coach Quarterly Outreach   Referral Date: 04/12/2017 Referral Source: MD Referral Reason for Referral: Disease Management Education Insurance: [facility] Medicare   Outreach Attempt:  Successful telephone outreach to patient's wife for quarterly follow up.  HIPAA verified with wife, patient with history of expressive aphasia.  Wife reports patient is sleeping but is doing well.  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  Continues to monitor blood sugars about 3 times a week.  Last fasting blood sugar was 76 with fasting ranges of 60-90's.  States patient has not verbalized or displayed any symptoms of hypoglycemia.  Encouraged wife to continue to monitor for signs and symptoms of hypoglycemia.  States patient is walking independently more frequently for exercise.  Denies any falls in the past year.  Wife also reporting patient's blood pressures have been well controlled.  Reports patient does not get flu vaccine due to previous reaction.  Encounter Medications:  Outpatient Encounter Medications as of 09/02/2018  Medication Sig Note  . aspirin 81 MG tablet Take 81 mg by mouth daily. 06/22/2017: Taking 325 mg  . atorvastatin (LIPITOR) 10 MG tablet Take 10 mg by mouth 2 (two) times a week.   . donepezil (ARICEPT) 5 MG tablet Take 5 mg by mouth at bedtime.   . hydrochlorothiazide (HYDRODIURIL) 25 MG tablet Take 25 mg by mouth daily.   Marland Kitchen lisinopril (PRINIVIL,ZESTRIL) 20 MG tablet Take 20 mg by mouth daily. 06/22/2017: 20 mg tablets takes 2 tablets twice a day  . Multiple Vitamins-Minerals (MULTIVITAMIN WITH MINERALS) tablet Take 1 tablet by mouth daily.   Marland Kitchen omeprazole (PRILOSEC) 40 MG capsule Take 40 mg by mouth daily.   . pioglitazone (ACTOS) 45 MG tablet Take 45 mg by mouth daily.   .  sertraline (ZOLOFT) 100 MG tablet Take 100 mg by mouth daily.   . sitaGLIPtin (JANUVIA) 100 MG tablet Take 100 mg by mouth daily.   . vitamin B-12 (CYANOCOBALAMIN) 100 MCG tablet Take 50 mcg by mouth daily.   Marland Kitchen GAVILYTE-N WITH FLAVOR PACK 420 G solution Take 420 g by mouth daily. 06/22/2017: Not Taking  . glimepiride (AMARYL) 4 MG tablet 4 mg Daily. 06/22/2017: Not taking per wife  . metFORMIN (GLUCOPHAGE) 1000 MG tablet 1,000 mg. 1 1/2 table am and 1 tablet @@ bedtime 06/22/2017: Per wife doctor discontinued due to diarrhea  . simvastatin (ZOCOR) 80 MG tablet Take 80 mg by mouth. Every evening 06/22/2017: Not taking per wife   No facility-administered encounter medications on file as of 09/02/2018.     Functional Status:  In your present state of health, do you have any difficulty performing the following activities: 05/13/2018 09/05/2017  Hearing? Y Y  Comment mild hearing loss -  Vision? N N  Difficulty concentrating or making decisions? Y Y  Comment forgetful, residual aphasia -  Walking or climbing stairs? 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 N N  Doing errands, shopping? Tempie Donning  Preparing Food and eating ? Y Y  Using the Toilet? N N  In the past six months, have you accidently leaked urine? N N  Do you have problems with loss of bowel control? N N  Managing your Medications? Tempie Donning  Managing  your Finances? Tempie Donning  Housekeeping or managing your Housekeeping? Y Y  Some recent data might be hidden    Fall/Depression Screening: Fall Risk  09/02/2018 05/13/2018 03/04/2018  Falls in the past year? 0 No No  Comment - - -  Risk for fall due to : Impaired balance/gait;Impaired vision - Impaired balance/gait;Impaired mobility  Follow up Falls evaluation completed;Education provided;Falls prevention discussed - -   PHQ 2/9 Scores 05/13/2018 09/05/2017 08/23/2017 04/20/2017 04/18/2017 07/09/2014  PHQ - 2 Score - 2 - 2 2 0  PHQ- 9 Score - 3  - 5 5 -  Exception Documentation Other- indicate reason in comment box - Other- indicate reason in comment box - - -  Not completed Spoke with wife, did not speak with patient - Spoke with patient's wife, unable to complete screening - - -   THN CM Care Plan Problem One     Most Recent Value  Care Plan Problem One  Knowledge deficit regarding self-management of Diabetes.  Role Documenting the Problem One  [place] for Problem One  Active  [facility] Long Term Goal   Patient will maintain a Hgb A1C of below 7 in the next 6 months.  THN Long Term Goal Start Date  05/13/18  Interventions for Problem One Long Term Goal  Current care plan and goal discussed with wife, encouraged to continue to monitor blood sugars and to monitor for signs and symptoms of hypoglycemia, reviewed signs and symptoms of hypoglycemia with wife, reviewed fasting blood sugars with wife, encouraged to continue to follow diabetic low salt diet options, encouraged to keep and attend medical appontments, reviewed medications and encouraged medication compliance, encouraged wife to notify physician for sustained hypoglycemic events     Appointments:  Attended last appointment with primary care provider, Dr. Sabra Heck on 04/16/2018 and has scheduled follow up in March 2020.  Encouraged to keep and attend this medical appointment.  Plan: RN Health Coach will send primary care provider Quarterly Update. RN Health Coach will make next telephone outreach to patient within the month of April.  [place] [phone] Nikcole [email or web address]

## 2018-10-17 DIAGNOSIS — Z6832 Body mass index (BMI) 32.0-32.9, adult: Secondary | ICD-10-CM | POA: Diagnosis not present

## 2018-10-17 DIAGNOSIS — Z8673 Personal history of transient ischemic attack (TIA), and cerebral infarction without residual deficits: Secondary | ICD-10-CM | POA: Diagnosis not present

## 2018-10-17 DIAGNOSIS — R635 Abnormal weight gain: Secondary | ICD-10-CM | POA: Diagnosis not present

## 2018-10-17 DIAGNOSIS — R413 Other amnesia: Secondary | ICD-10-CM | POA: Diagnosis not present

## 2018-10-17 DIAGNOSIS — E1122 Type 2 diabetes mellitus with diabetic chronic kidney disease: Secondary | ICD-10-CM | POA: Diagnosis not present

## 2018-10-17 DIAGNOSIS — F325 Major depressive disorder, single episode, in full remission: Secondary | ICD-10-CM | POA: Diagnosis not present

## 2018-10-17 DIAGNOSIS — I129 Hypertensive chronic kidney disease with stage 1 through stage 4 chronic kidney disease, or unspecified chronic kidney disease: Secondary | ICD-10-CM | POA: Diagnosis not present

## 2018-10-17 DIAGNOSIS — N183 Chronic kidney disease, stage 3 (moderate): Secondary | ICD-10-CM | POA: Diagnosis not present

## 2018-10-17 DIAGNOSIS — E7800 Pure hypercholesterolemia, unspecified: Secondary | ICD-10-CM | POA: Diagnosis not present

## 2018-11-26 ENCOUNTER — Encounter: Payer: Self-pay | Admitting: *Deleted

## 2018-11-26 ENCOUNTER — Other Ambulatory Visit: Payer: Self-pay | Admitting: *Deleted

## 2018-11-26 ENCOUNTER — Other Ambulatory Visit: Payer: Self-pay

## 2018-11-26 NOTE — Patient Outreach (Signed)
Ronald Dyer) Care Management  Ronald Dyer Care Manager  11/26/2018   Ronald Dyer [name] 1940-10-03 [id number]   RN Health Coach Quarterly Outreach   Referral Date: 04/12/2017 Referral Source: MD Referral Reason for Referral: Disease Management Education Insurance: [facility] Medicare   Outreach Attempt:  Successful telephone outreach to patient's wife for quarterly follow up.  HIPAA verified with wife (patient with aphasia and memory deficits since stroke).  Wife stating patient is doing well.  Denies any sick days.  Continues to monitor blood sugars about 2-3 times a week.  Last fasting blood sugar was 107 with fasting ranges of 70-80's.  Denies any blood sugars below 65 and denies any episodes of hyperglycemia.  Latest Hgb A1C is 6.7 on 10/17/2018.  Reports she and patient are practicing social distancing and stay at home.  Patient continues to be active and walking around the house.  Reviewed signs and symptoms of COVID 19 and encouraged wife to contact provider if symptoms arise.  Encounter Medications:  Outpatient Encounter Medications as of 11/26/2018  Medication Sig Note  . aspirin 81 MG tablet Take 81 mg by mouth daily. 06/22/2017: Taking 325 mg  . atorvastatin (LIPITOR) 10 MG tablet Take 10 mg by mouth 2 (two) times a week.   . donepezil (ARICEPT) 5 MG tablet Take 5 mg by mouth at bedtime.   . hydrochlorothiazide (HYDRODIURIL) 25 MG tablet Take 25 mg by mouth daily.   Marland Kitchen lisinopril (PRINIVIL,ZESTRIL) 20 MG tablet Take 20 mg by mouth daily. 06/22/2017: 20 mg tablets takes 2 tablets twice a day  . Multiple Vitamins-Minerals (MULTIVITAMIN WITH MINERALS) tablet Take 1 tablet by mouth daily.   Marland Kitchen omeprazole (PRILOSEC) 40 MG capsule Take 40 mg by mouth daily.   . pioglitazone (ACTOS) 45 MG tablet Take 45 mg by mouth daily.   . sertraline (ZOLOFT) 100 MG tablet Take 100 mg by mouth daily.   . sitaGLIPtin (JANUVIA) 100 MG tablet Take 100 mg by mouth daily.   . vitamin B-12  (CYANOCOBALAMIN) 100 MCG tablet Take 50 mcg by mouth daily.   Marland Kitchen GAVILYTE-N WITH FLAVOR PACK 420 G solution Take 420 g by mouth daily. 06/22/2017: Not Taking  . glimepiride (AMARYL) 4 MG tablet 4 mg Daily. 06/22/2017: Not taking per wife  . metFORMIN (GLUCOPHAGE) 1000 MG tablet 1,000 mg. 1 1/2 table am and 1 tablet @@ bedtime 06/22/2017: Per wife doctor discontinued due to diarrhea  . simvastatin (ZOCOR) 80 MG tablet Take 80 mg by mouth. Every evening 06/22/2017: Not taking per wife   No facility-administered encounter medications on file as of 11/26/2018.     Functional Status:  In your present state of health, do you have any difficulty performing the following activities: 05/13/2018  Hearing? Y  Comment mild hearing loss  Vision? N  Difficulty concentrating or making decisions? Y  Comment forgetful, residual aphasia  Walking or climbing stairs? 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 N  Doing errands, shopping? Y  Preparing Food and eating ? Y  Using the Toilet? N  In the past six months, have you accidently leaked urine? N  Do you have problems with loss of bowel control? N  Managing your Medications? Y  Managing your Finances? Y  Housekeeping or managing your Housekeeping? Y  Some recent data might be hidden    Fall/Depression Screening: Fall Risk  11/26/2018 09/02/2018 05/13/2018  Falls in the past year? 0 0 No  Comment - - -  Risk for fall due to : Impaired balance/gait;Impaired vision Impaired balance/gait;Impaired vision -  Follow up Falls prevention discussed;Falls evaluation completed;Education provided Falls evaluation completed;Education provided;Falls prevention discussed -   PHQ 2/9 Scores 05/13/2018 09/05/2017 08/23/2017 04/20/2017 04/18/2017 07/09/2014  PHQ - 2 Score - 2 - 2 2 0  PHQ- 9 Score - 3 - 5 5 -  Exception Documentation Other- indicate reason in comment box - Other- indicate reason in comment box - - -  Not  completed Spoke with wife, did not speak with patient - Spoke with patient's wife, unable to complete screening - - -   THN CM Care Plan Problem One     Most Recent Value  Care Plan Problem One  Knowledge deficit regarding self-management of Diabetes.  Role Documenting the Problem One  Ronald Dyer for Problem One  Active  Ronald Dyer Long Term Goal   Patient will maintain a Hgb A1C of below 6.9 in the next 6 months.  THN Long Term Goal Start Date  11/26/18  THN Long Term Goal Met Date  11/26/18  Interventions for Problem One Long Term Goal  Congratulated wife on patient's current Hgb A1C results, reviewed and discussed current care plan and goals, reviewed medications and encouraged medication compliance, encouraged to keep and attend scheduled medical appointments, encouraged continued exercise and increased activity as tolerated and practicing social distancing, reviewed signs and symptoms of COVID 19 and encouraged wife to contact provider if symptoms develop, encouraged continued monitoring of blood sugars, discussed monitoring for hypoglycemic events and reviewed signs and symptoms of hypoglycemia      Appointments:  Attended appointment with primary care provider, Dr. Sabra Heck on 10/17/2018 and has scheduled follow up on 12/13/2018.  Plan: RN Health Coach will send primary care provider quarterly update. RN Health Coach will make next telephone outreach to patient within the month of July.  [place] [phone] [email or web address]

## 2018-12-13 DIAGNOSIS — Z0000 Encounter for general adult medical examination without abnormal findings: Secondary | ICD-10-CM | POA: Diagnosis not present

## 2018-12-19 DIAGNOSIS — N183 Chronic kidney disease, stage 3 (moderate): Secondary | ICD-10-CM | POA: Diagnosis not present

## 2018-12-19 DIAGNOSIS — Z85038 Personal history of other malignant neoplasm of large intestine: Secondary | ICD-10-CM | POA: Diagnosis not present

## 2018-12-19 DIAGNOSIS — I129 Hypertensive chronic kidney disease with stage 1 through stage 4 chronic kidney disease, or unspecified chronic kidney disease: Secondary | ICD-10-CM | POA: Diagnosis not present

## 2018-12-19 DIAGNOSIS — I10 Essential (primary) hypertension: Secondary | ICD-10-CM | POA: Diagnosis not present

## 2018-12-19 DIAGNOSIS — E1165 Type 2 diabetes mellitus with hyperglycemia: Secondary | ICD-10-CM | POA: Diagnosis not present

## 2018-12-19 DIAGNOSIS — E113299 Type 2 diabetes mellitus with mild nonproliferative diabetic retinopathy without macular edema, unspecified eye: Secondary | ICD-10-CM | POA: Diagnosis not present

## 2018-12-19 DIAGNOSIS — I6350 Cerebral infarction due to unspecified occlusion or stenosis of unspecified cerebral artery: Secondary | ICD-10-CM | POA: Diagnosis not present

## 2018-12-19 DIAGNOSIS — F325 Major depressive disorder, single episode, in full remission: Secondary | ICD-10-CM | POA: Diagnosis not present

## 2018-12-19 DIAGNOSIS — E1122 Type 2 diabetes mellitus with diabetic chronic kidney disease: Secondary | ICD-10-CM | POA: Diagnosis not present

## 2019-01-17 ENCOUNTER — Other Ambulatory Visit: Payer: Self-pay | Admitting: *Deleted

## 2019-02-10 ENCOUNTER — Other Ambulatory Visit: Payer: Self-pay | Admitting: *Deleted

## 2019-02-10 ENCOUNTER — Encounter: Payer: Self-pay | Admitting: *Deleted

## 2019-02-10 NOTE — Patient Outreach (Addendum)
[facility]) Care Management  02/10/2019  Ronald Dyer [facility] 27-Jul-1941 [id number]   RN Health CoachQuarterlyOutreach  Referral Date: 04/12/2017 Referral Source: MD Referral Reason for Referral: Disease Management Education Insurance: [facility] Medicare   Outreach Attempt:  Received telephone call from patient's wife [place].  HIPAA verified with wife.  Wife reporting patient is having frequent falls and is a lot weaker and having difficulties getting up from falls.  States she has had to call non emergency 911 to get assistance to get patient up.  Upon questioning wife is describing patient may have more right sided weakness.  Reports blood sugars and blood pressures have been good but she has not checked patient's blood sugars or blood pressures at the time of falls.  Encouraged wife to check blood sugar and blood pressure at time of falls.  Reports recent fall in the closet and patient needing assistance getting back in the house as of yesterday.  Unable to determine any speech changes.  Wife is calling to request transportation assistance for patient in his upcoming appointments in the next month to the [place] [facility] Eye Appointment; as she feels she is not able to get patient in and out of vehicle to transport him on her own.  Discussed with wife, patient needs to be evaluated by primary care provider and possible need for home health therapy arranged for safety evaluation.  Offered to help wife arrange appointment with primary care provider.  Wife states she has already contacted primary care provider and left message with nurse and is awaiting telephone call back.  Encouraged wife to request home health therapy for safety evaluation and possible need for wheelchair to help with transport.  Discussed [place] [facility] referral for transportation options.  Wife verbally agrees.  Wife also reporting patient has been without his diabetes medication, Januvia for about a month  due to prescription costing about $140 a month.  States she believes someone at the primary care office is looking into medication assistance but she has not heard anything from the office.  Discussed [place] referral for medication assistance and wife verbally agrees.  Encouraged wife to contact [place] if she does not get return call back for primary care office concerning patient's falls.  Appointments:  Patient had Telehealth appointment with primary care provider around 12/13/2018 and wife states follow up is scheduled for September.  She is awaiting return call back today for possible appointment as soon as possible for change in patient's condition.  States patient has appointments with the [place] and Eye Provider in the month of July.  Plan: RN Health Coach will place [facility] referral for transportation resources. RN Health Coach will place [place] referral for medication assistance with Januvia. RN Health Coach will route this note to primary care provider, Dr. Sabra Heck regarding change in patient's condition. RN Health Coach will make telephone outreach to patient's wife within 10 business days to follow up.  [place] [phone] Necie [email or web address]

## 2019-02-11 ENCOUNTER — Telehealth: Payer: Self-pay | Admitting: Pharmacist

## 2019-02-11 ENCOUNTER — Other Ambulatory Visit: Payer: Self-pay | Admitting: *Deleted

## 2019-02-11 ENCOUNTER — Other Ambulatory Visit: Payer: Self-pay

## 2019-02-11 NOTE — Patient Outreach (Signed)
[facility]) Care Management  02/11/2019  Axzel Rockhill [facility] 10-07-1940 [id number]   RN Health CoachPrimary Care Question  Referral Date: 04/12/2017 Referral Source: MD Referral Reason for Referral: Disease Management Education Insurance: [facility] Medicare   Outreach Attempt:  Was notified by CMA that patient's primary care office was attempting to place [facility] referral.  Call placed to office coordinator, Shirlean Mylar [phone]), no answer.  HIPAA compliant voice message left.  Plan:  RN Health Coach will await return call back from [place] for clarification of patient needs.  [place] [phone] [email or web address]

## 2019-02-11 NOTE — Patient Outreach (Addendum)
Ronald Dyer) Care Management  [place]   02/11/2019  Ronald Dyer [facility] 1940-09-19 [id number]  Reason for referral: Medication Assistance, Medication Review  Referral source: [employer] Care Manager with [facility] Current insurance: [employer] C-SNP  PMHx includes but not limited to:   COPD, CVA, depression, type 2 diabetes and  GERD.  Outreach:  Successful telephone call with patient's wife Ronald Dyer).  HIPAA identifiers verified.   Subjective:  Patient's wife reports Januvia had a copay of >$100 and that was cost prohibitive for their family.  Patient's wife manages his medications.    Objective: The ASCVD Risk score [name], et al., 2013) failed to calculate for the following reasons:   The patient has a prior MI or stroke diagnosis  Lab Results  Component Value Date   CREATININE 1.1 07/01/2014   CREATININE 0.95 03/25/2014   CREATININE 0.9 06/27/2013    Lab Results  Component Value Date   HGBA1C 7.0 03/25/2014    Lipid Panel  No results found for: CHOL, TRIG, HDL, CHOLHDL, VLDL, LDLCALC, LDLDIRECT  BP Readings from Last 3 Encounters:  04/18/17 (!) 151/82  04/12/17 (!) 172/92  07/09/14 (!) 158/83    No Known Allergies  Medications Reviewed Today    Reviewed by Elayne Guerin, [place] (Pharmacist) on 02/11/19 at 1658  Med List Status: <None>  Medication Order Taking? Sig Documenting Provider Last Dose Status Informant  aspirin 81 MG tablet [id number]  Take 81 mg by mouth daily. [provider]  Active            Med Note Shelby Mattocks [facility]   Fri Jun 22, 2017 10:29 AM) Taking 325 mg  atorvastatin (LIPITOR) 10 MG tablet [id number] Yes Take 10 mg by mouth 2 (two) times a week. [provider] Taking Active Spouse/Significant Other  donepezil (ARICEPT) 5 MG tablet [id number] Yes Take 5 mg by mouth at bedtime. [provider] Taking Active Spouse/Significant Other  glimepiride (AMARYL) 4 MG tablet  [id number] Yes 4 mg Daily. [provider] Taking Active            Med Note Elayne Guerin   Tue Feb 11, 2019  3:08 PM)    hydrochlorothiazide (HYDRODIURIL) 25 MG tablet [id number] Yes Take 25 mg by mouth daily. [provider] Taking Active Self  lisinopril (ZESTRIL) 40 MG tablet [id number] Yes Take 80 mg by mouth daily. [provider] Taking Active   Multiple Vitamins-Minerals (MULTIVITAMIN WITH MINERALS) tablet [id number] Yes Take 1 tablet by mouth daily. [provider] Taking Active   omeprazole (PRILOSEC) 40 MG capsule [id number] Yes Take 40 mg by mouth daily. [provider] Taking Active Spouse/Significant Other  pioglitazone (ACTOS) 45 MG tablet [id number] Yes Take 45 mg by mouth daily. [provider] Taking Active   sertraline (ZOLOFT) 100 MG tablet [id number] Yes Take 100 mg by mouth daily. [provider] Taking Active Spouse/Significant Other  sitaGLIPtin (JANUVIA) 100 MG tablet [id number] No Take 100 mg by mouth daily. [provider] Not Taking Active Spouse/Significant Other           Med Note Sadie Haber Feb 10, 2019 10:57 AM) Can not afford the copay at this time, awaiting for medication assistance  vitamin B-12 (CYANOCOBALAMIN) 100 MCG tablet [id number] Yes Take 50 mcg by mouth daily. [provider] Taking Active           Assessment: Drugs sorted by system:  Neurologic/Psychologic:  Donepezil, Sertraline,   Cardiovascular:  Atorvastatin, HCTZ, Lisinopril, Aspirin  Gastrointestinal: Omeprazole  Endocrine: Glimepiride, Pioglitazone, Januvia  Vitamins/Minerals/Supplements: Multiple Vitamin, Vitamin B 12,        Medication Review Findings:  . Potential Dose too high- Lisinopril 40 mg dosed BID. Manufacturer labeling suggests max dose of 40 mg daily.   HgA1c-6.7%  Medication Assistance Findings:  Medication assistance needs identified: Januvia-[name] Patient Assistance Program    Patient's wife said she already completed an application and mailed it off for patient assistance. [name] Patient Assistance Program was called and the representative said they did not have an application for the patient on file.  Extra Help:  Not eligible for Extra Help Low Income Subsidy based on reported income and assets  Patient Assistance Programs: Januvia  made by [employer] o Income requirement met: Yes o Out-of-pocket prescription expenditure met:   Not Applicable - Patient has met application requirements to apply for this patient assistance program.       Plan: . I will route patient assistance letter to [place] technician who will coordinate patient assistance program application process for medications listed above.  [facility] pharmacy technician will assist with obtaining all required documents from both patient and provider(s) and submit application(s) once completed.  . Will route note to PCP about applications and lisinopril dose..  . Will follow-up in 4-6 weeks.Elayne Guerin, PharmD, [place] Clinical Pharmacist [phone]

## 2019-02-11 NOTE — Patient Outreach (Signed)
[place] [facility]) Care Management  02/11/2019  Kinsley Nicklaus [facility] 08/21/1941 [id number]   Social work referral received from [employer], Hubert Azure.  "Patient needs assistance with transportation resources. Wife is reporting increase in falls at home and increase in weakness. She has had to call EMS to help get patient off floor and feels she can not get patient to vehicle to transport to medical appointments at this time. Does not have a wheelchair at this time." Successful outreach to patient's spouse today.  BSW explained purpose of call.  Spouse reports that they were contacted by an agency that is supposed to conduct a home visit to "discuss all the things Sheppard Evens and I talked about yesterday".  BSW informed spouse that this is likely a home health agency and transportation is typically not included in services.  Per spouse, she was told that transportation needs would be discussed.  She declined assistance at this time but BSW ensured that she has my contact information and encouraged her to call after meeting with [facility] agency if transportation assistance is still needed. Closing case at this time but will assist if contacted by patient or spouse.  Ronn Melena, BSW Social Worker [phone]

## 2019-02-12 ENCOUNTER — Other Ambulatory Visit: Payer: Self-pay | Admitting: *Deleted

## 2019-02-12 DIAGNOSIS — I129 Hypertensive chronic kidney disease with stage 1 through stage 4 chronic kidney disease, or unspecified chronic kidney disease: Secondary | ICD-10-CM | POA: Diagnosis not present

## 2019-02-12 DIAGNOSIS — E113293 Type 2 diabetes mellitus with mild nonproliferative diabetic retinopathy without macular edema, bilateral: Secondary | ICD-10-CM | POA: Diagnosis not present

## 2019-02-12 DIAGNOSIS — E7800 Pure hypercholesterolemia, unspecified: Secondary | ICD-10-CM | POA: Diagnosis not present

## 2019-02-12 DIAGNOSIS — I6350 Cerebral infarction due to unspecified occlusion or stenosis of unspecified cerebral artery: Secondary | ICD-10-CM | POA: Diagnosis not present

## 2019-02-12 DIAGNOSIS — Z85038 Personal history of other malignant neoplasm of large intestine: Secondary | ICD-10-CM | POA: Diagnosis not present

## 2019-02-12 DIAGNOSIS — E1122 Type 2 diabetes mellitus with diabetic chronic kidney disease: Secondary | ICD-10-CM | POA: Diagnosis not present

## 2019-02-12 DIAGNOSIS — I10 Essential (primary) hypertension: Secondary | ICD-10-CM | POA: Diagnosis not present

## 2019-02-12 DIAGNOSIS — F325 Major depressive disorder, single episode, in full remission: Secondary | ICD-10-CM | POA: Diagnosis not present

## 2019-02-12 DIAGNOSIS — E1165 Type 2 diabetes mellitus with hyperglycemia: Secondary | ICD-10-CM | POA: Diagnosis not present

## 2019-02-12 NOTE — Patient Outreach (Addendum)
[facility]) Care Management  02/12/2019  Ronald Dyer [facility] [birth year] [id number]   RN Health CoachPrimary Care Question  Referral Date: 04/12/2017 Referral Source: MD Referral Reason for Referral: Disease Management Education Insurance: [employer] Medicare   Addendum:  Received call from Ronald Dyer [facility] Patient Care Coordinator).  Ronald Dyer states she has communicated with Dr. Sabra Heck and [place] has been arranged for patient with [facility].  Unsure if provider has arranged Telehealth appointment as of yet.  RN Health Coach will continue to plan for telephone outreach within the next 10 business days.  Have requested [name] Worker reach back out to wife for transportation resources.   Outreach Attempt:  Received telephone call back from [name] (Patient Care Coordinator) at [facility].  Informed Ronald Dyer, patient has been active with [facility] for several years and I had spoken with wife on Monday.  Per wife's report she was awaiting call back from PCP on Monday and I discussed with her requesting home health orders. Discussed patient's wife concerns and patient needs with Ronald Dyer.  There was some misunderstanding and she was informing wife the office was to place [facility] referral, again not recognizing we were already active.  Requested Ronald Dyer contact wife to clarity and request she work with [facility] Pharmacist and [facility] Education officer, museum.  Also, requested Ronald Dyer follow up with provider for Telehealth versus in person visit and orders for [place] and Physical Therapy orders.  Ronald Dyer stated she would follow up with provider and then update this RN Health Coach.  Ronald Dyer states that patient is working with Clinical research associate at PCP office, this information sent to [facility] Pharmacist.  Plan:  Will await update from [place] concerning providers instructions.  RN Health Coach will make outreach to patient's wife within the next 10 business day.  RN Health Coach will send in basket  message to [facility] Pharmacist and [facility] Social Worker with update.  [name] [phone] [name]

## 2019-02-13 ENCOUNTER — Ambulatory Visit: Payer: Self-pay

## 2019-02-13 ENCOUNTER — Other Ambulatory Visit: Payer: Self-pay | Admitting: Pharmacy Technician

## 2019-02-13 ENCOUNTER — Other Ambulatory Visit: Payer: Self-pay

## 2019-02-13 NOTE — Patient Outreach (Signed)
[facility]) Care Management  02/13/2019  Ronald Dyer [facility] 22-Mar-1941 [id number]                           Medication Assistance Referral  Referral From: [address]  Medication/Company: [name] / [name] Patient application portion:  Education officer, museum portion:  Mailed to Dr [name]      Follow up:  Will follow up with patient in 5-10 business days to confirm application(s) have been received.  [name] P. Kirklin Mcduffee, [place] Management [phone]

## 2019-02-13 NOTE — Patient Outreach (Signed)
[facility] [facility]) Care Management  02/13/2019  Solan Vosler [facility] 04-03-1941 [id number]   Request received from [place], Hubert Azure, to contact patient's spouse again regarding need for transportation resources.   Successful outreach to spouse today.  Spouse reports that she is able to drive patient to appointments but needs someone that can come into the home to assist with getting him out of the house.  She reports that patient utilizes a walker and they do not have a ramp at the home.  BSW informed spouse that, unfortunately, transportation agencies do not provide "hands on" assistance to patients for liability reasons.   Patient is to receive HHPT with [facility].  BSW encouraged spouse to inquire about possible aide services in conjunction with PT so that she could possibly have assistance with patient for upcoming appointments.   BSW contacted Sheppard Evens to provide the above information.  Sheppard Evens has a follow up outreach scheduled for next week and will further assess needs at that time.  If necessary, BSW will collaborate with [facility] provider after assessment occurs.  Ronn Melena, BSW Social Worker [phone]

## 2019-02-19 ENCOUNTER — Encounter: Payer: Self-pay | Admitting: *Deleted

## 2019-02-19 ENCOUNTER — Other Ambulatory Visit: Payer: Self-pay | Admitting: *Deleted

## 2019-02-19 ENCOUNTER — Ambulatory Visit: Payer: Self-pay

## 2019-02-19 ENCOUNTER — Other Ambulatory Visit: Payer: Self-pay

## 2019-02-19 NOTE — Patient Outreach (Signed)
[place] [facility]) Care Management  [facility] Care Manager  02/19/2019   Nassir Neidert [facility] 09-25-1940 [id number]   RN Health Coach Primary Care Question   Referral Date: 04/12/2017 Referral Source: MD Referral Reason for Referral: Disease Management Education Insurance: [facility] Medicare    Outreach Attempt:  Successful telephone outreach to patient's wife for follow up.  HIPAA verified with wife (patient with recent stroke and some aphasia).  Wife reporting patient is doing better with ambulating.  States he is walking around the house and outside without any assistive device.  Denies any falls in the past 2 weeks.  Feels like he has regained some of his strength back.  Reports Home Health Physical Therapy with [facility] is scheduled to come out on next Tuesday, 02/25/2019.  Encouraged wife to encourage patient to participate with home health therapist.  Continues to monitor blood sugars about 2-3 times a week.  Fasting ranges are 90-140's.  Blood pressures have been ranging 130-140/70-90's.  [facility] Pharmacist is assisting with medication assistance application for Januvia.  Encounter Medications:  Outpatient Encounter Medications as of 02/19/2019  Medication Sig Note  . aspirin 81 MG tablet Take 81 mg by mouth daily. 06/22/2017: Taking 325 mg  . atorvastatin (LIPITOR) 10 MG tablet Take 10 mg by mouth 2 (two) times a week.   . donepezil (ARICEPT) 5 MG tablet Take 5 mg by mouth at bedtime.   . hydrochlorothiazide (HYDRODIURIL) 25 MG tablet Take 25 mg by mouth daily.   Marland Kitchen lisinopril (ZESTRIL) 40 MG tablet Take 80 mg by mouth daily.   . Multiple Vitamins-Minerals (MULTIVITAMIN WITH MINERALS) tablet Take 1 tablet by mouth daily.   Marland Kitchen omeprazole (PRILOSEC) 40 MG capsule Take 40 mg by mouth daily.   . pioglitazone (ACTOS) 45 MG tablet Take 45 mg by mouth daily.   . sertraline (ZOLOFT) 100 MG tablet Take 100 mg by mouth daily.   . vitamin B-12 (CYANOCOBALAMIN) 100 MCG tablet Take 50 mcg by mouth  daily.   Marland Kitchen glimepiride (AMARYL) 4 MG tablet 4 mg Daily.   . sitaGLIPtin (JANUVIA) 100 MG tablet Take 100 mg by mouth daily. 02/10/2019: Can not afford the copay at this time, awaiting for medication assistance   No facility-administered encounter medications on file as of 02/19/2019.     Functional Status:  In your present state of health, do you have any difficulty performing the following activities: 05/13/2018  Hearing? Y  Comment mild hearing loss  Vision? N  Difficulty concentrating or making decisions? Y  Comment forgetful, residual aphasia  Walking or climbing stairs? 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 N  Doing errands, shopping? Y  Preparing Food and eating ? Y  Using the Toilet? N  In the past six months, have you accidently leaked urine? N  Do you have problems with loss of bowel control? N  Managing your Medications? Y  Managing your Finances? Y  Housekeeping or managing your Housekeeping? Y  Some recent data might be hidden    Fall/Depression Screening: Fall Risk  02/19/2019 02/10/2019 11/26/2018  Falls in the past year? 1 1 0  Comment - recently multiple falls and trouble getting up -  Number falls in past yr: 1 1 -  Comment no falls since middle of June 2020 - -  Injury with Fall? 0 0 -  Risk for fall due to : History of fall(s);Medication side effect;Impaired balance/gait;Mental status change;Impaired mobility;Impaired vision History of fall(s);Medication  side effect;Mental status change;Impaired balance/gait;Impaired mobility;Impaired vision Impaired balance/gait;Impaired vision  Follow up Falls evaluation completed;Education provided;Falls prevention discussed Falls evaluation completed;Education provided;Falls prevention discussed Falls prevention discussed;Falls evaluation completed;Education provided   [facility] 2/9 Scores 05/13/2018 09/05/2017 08/23/2017 04/20/2017 04/18/2017 07/09/2014  PHQ - 2 Score - 2 - 2 2 0   PHQ- 9 Score - 3 - 5 5 -  Exception Documentation Other- indicate reason in comment box - Other- indicate reason in comment box - - -  Not completed Spoke with wife, did not speak with patient - Spoke with patient's wife, unable to complete screening - - -   THN CM Care Plan Problem One     Most Recent Value  Care Plan Problem One  Knowledge deficit regarding self-management of Diabetes.  Role Documenting the Problem One  [place] for Problem One  Active  [facility] Long Term Goal   Patient will maintain a Hgb A1C of below 6.9 in the next 6 months.  THN Long Term Goal Start Date  11/26/18  Interventions for Problem One Long Term Goal  Reviewed and discussed care plan and goals, reviewed medications and encouraged medication compliance, [facility] referral placed to assist with medication assistance forms for Januvia, encouraged to keep and attend medical appointments, [facility] Social referral for transportation options, encouraged to continue to monitor blood sugars and blood pressures and to check vitals when patient is falling, encouraged and reviewed diabetic diet  THN CM Short Term Goal #1   Wife will report no falls for patient in the next 30 days.  THN CM Short Term Goal #1 Start Date  02/19/19  Interventions for Short Term Goal #1  Assisted in requesting ordering home health physical therapy for the patient, encouraged wife to encouraged patient to participate with home therapist, encouraged wife to encourage patient to use cane or walker during all ambulation to help with balance, discussed with wife seeking assistance from her son and daughter with transportation of patient to medical appointments, fall precautions and preventions reviewed and discussed     Appointments:  Attended appointment with primary care provider on 12/13/2018 and follows up in September.  Has eye appointment on 03/21/2019 and cancer center appointment on 03/14/2019.  Wife reporting she feels she can get patient  to appointments at this time and her daughter is planning to attend appointments with them.  Plan: RN Health Coach will send primary care provider Quarterly Update. RN Health Coach will make next telephone outreach to patient within the month of August.  Loel Betancur RN [place] [phone] [email or web address]

## 2019-02-19 NOTE — Patient Outreach (Signed)
[facility]) Care Management  02/19/2019  Ronald Dyer [facility] January 20, 1941 [id number]   In-basket message received from [place], Hubert Azure, regarding patient's previous request for transportation assistance.  Spouse reported today that patient is ambulating without falls.  Physical Therapy to being on 02/25/19.  Daughter is transporting him to appointments at the end of the month.  BSW is closing case due to transportation assistance not being needed at this time.    Ronn Melena, BSW Social Worker [phone]

## 2019-02-21 ENCOUNTER — Ambulatory Visit: Payer: Medicare HMO | Admitting: *Deleted

## 2019-02-24 ENCOUNTER — Other Ambulatory Visit: Payer: Self-pay | Admitting: Pharmacy Technician

## 2019-02-24 NOTE — Patient Outreach (Signed)
[place] [facility]) Care Management  02/24/2019  Ronald Dyer [facility] May 03, 1941 [id number]   Successful outreach call placed to patient in regards to [name] application for [place].  Spoke to patient's wife, HIPAA identifiers verified.  Patient's wife confirmed they received the application and she mailed it back before the holiday.  Will followup with patient in 10-14 business days if application has not been returned.  Massimo Hartland P. Anders Hohmann, [place] Management [phone]

## 2019-02-27 DIAGNOSIS — N183 Chronic kidney disease, stage 3 (moderate): Secondary | ICD-10-CM | POA: Diagnosis not present

## 2019-02-27 DIAGNOSIS — I700 Atherosclerosis of aorta: Secondary | ICD-10-CM | POA: Diagnosis not present

## 2019-02-27 DIAGNOSIS — I129 Hypertensive chronic kidney disease with stage 1 through stage 4 chronic kidney disease, or unspecified chronic kidney disease: Secondary | ICD-10-CM | POA: Diagnosis not present

## 2019-02-27 DIAGNOSIS — I69398 Other sequelae of cerebral infarction: Secondary | ICD-10-CM | POA: Diagnosis not present

## 2019-02-27 DIAGNOSIS — K219 Gastro-esophageal reflux disease without esophagitis: Secondary | ICD-10-CM | POA: Diagnosis not present

## 2019-02-27 DIAGNOSIS — R2689 Other abnormalities of gait and mobility: Secondary | ICD-10-CM | POA: Diagnosis not present

## 2019-02-27 DIAGNOSIS — M6281 Muscle weakness (generalized): Secondary | ICD-10-CM | POA: Diagnosis not present

## 2019-02-27 DIAGNOSIS — E113299 Type 2 diabetes mellitus with mild nonproliferative diabetic retinopathy without macular edema, unspecified eye: Secondary | ICD-10-CM | POA: Diagnosis not present

## 2019-02-27 DIAGNOSIS — E1122 Type 2 diabetes mellitus with diabetic chronic kidney disease: Secondary | ICD-10-CM | POA: Diagnosis not present

## 2019-03-06 ENCOUNTER — Other Ambulatory Visit: Payer: Self-pay | Admitting: Pharmacy Technician

## 2019-03-06 DIAGNOSIS — I129 Hypertensive chronic kidney disease with stage 1 through stage 4 chronic kidney disease, or unspecified chronic kidney disease: Secondary | ICD-10-CM | POA: Diagnosis not present

## 2019-03-06 DIAGNOSIS — R2689 Other abnormalities of gait and mobility: Secondary | ICD-10-CM | POA: Diagnosis not present

## 2019-03-06 DIAGNOSIS — I700 Atherosclerosis of aorta: Secondary | ICD-10-CM | POA: Diagnosis not present

## 2019-03-06 DIAGNOSIS — I69398 Other sequelae of cerebral infarction: Secondary | ICD-10-CM | POA: Diagnosis not present

## 2019-03-06 DIAGNOSIS — E1122 Type 2 diabetes mellitus with diabetic chronic kidney disease: Secondary | ICD-10-CM | POA: Diagnosis not present

## 2019-03-06 DIAGNOSIS — K219 Gastro-esophageal reflux disease without esophagitis: Secondary | ICD-10-CM | POA: Diagnosis not present

## 2019-03-06 DIAGNOSIS — N183 Chronic kidney disease, stage 3 (moderate): Secondary | ICD-10-CM | POA: Diagnosis not present

## 2019-03-06 DIAGNOSIS — E113299 Type 2 diabetes mellitus with mild nonproliferative diabetic retinopathy without macular edema, unspecified eye: Secondary | ICD-10-CM | POA: Diagnosis not present

## 2019-03-06 DIAGNOSIS — M6281 Muscle weakness (generalized): Secondary | ICD-10-CM | POA: Diagnosis not present

## 2019-03-06 NOTE — Patient Outreach (Signed)
[place] [facility]) Care Management  03/06/2019  Hisao Doo [facility] 29-Aug-1940 [id number]    Received all necessary documents and signatures from both patient and provider for [name] patient assistance for Januvia.  Submitted completed application via mail.  Will followup with [name] in 10-14 business days.  Mariacristina Aday P. Dakiya Puopolo, [place] Management [phone]

## 2019-03-07 DIAGNOSIS — K219 Gastro-esophageal reflux disease without esophagitis: Secondary | ICD-10-CM | POA: Diagnosis not present

## 2019-03-07 DIAGNOSIS — I700 Atherosclerosis of aorta: Secondary | ICD-10-CM | POA: Diagnosis not present

## 2019-03-07 DIAGNOSIS — I69398 Other sequelae of cerebral infarction: Secondary | ICD-10-CM | POA: Diagnosis not present

## 2019-03-07 DIAGNOSIS — R2689 Other abnormalities of gait and mobility: Secondary | ICD-10-CM | POA: Diagnosis not present

## 2019-03-07 DIAGNOSIS — E1122 Type 2 diabetes mellitus with diabetic chronic kidney disease: Secondary | ICD-10-CM | POA: Diagnosis not present

## 2019-03-07 DIAGNOSIS — E113299 Type 2 diabetes mellitus with mild nonproliferative diabetic retinopathy without macular edema, unspecified eye: Secondary | ICD-10-CM | POA: Diagnosis not present

## 2019-03-07 DIAGNOSIS — M6281 Muscle weakness (generalized): Secondary | ICD-10-CM | POA: Diagnosis not present

## 2019-03-07 DIAGNOSIS — I129 Hypertensive chronic kidney disease with stage 1 through stage 4 chronic kidney disease, or unspecified chronic kidney disease: Secondary | ICD-10-CM | POA: Diagnosis not present

## 2019-03-07 DIAGNOSIS — N183 Chronic kidney disease, stage 3 (moderate): Secondary | ICD-10-CM | POA: Diagnosis not present

## 2019-03-11 DIAGNOSIS — M6281 Muscle weakness (generalized): Secondary | ICD-10-CM | POA: Diagnosis not present

## 2019-03-11 DIAGNOSIS — I129 Hypertensive chronic kidney disease with stage 1 through stage 4 chronic kidney disease, or unspecified chronic kidney disease: Secondary | ICD-10-CM | POA: Diagnosis not present

## 2019-03-11 DIAGNOSIS — I700 Atherosclerosis of aorta: Secondary | ICD-10-CM | POA: Diagnosis not present

## 2019-03-11 DIAGNOSIS — I69398 Other sequelae of cerebral infarction: Secondary | ICD-10-CM | POA: Diagnosis not present

## 2019-03-11 DIAGNOSIS — R2689 Other abnormalities of gait and mobility: Secondary | ICD-10-CM | POA: Diagnosis not present

## 2019-03-11 DIAGNOSIS — E113299 Type 2 diabetes mellitus with mild nonproliferative diabetic retinopathy without macular edema, unspecified eye: Secondary | ICD-10-CM | POA: Diagnosis not present

## 2019-03-11 DIAGNOSIS — K219 Gastro-esophageal reflux disease without esophagitis: Secondary | ICD-10-CM | POA: Diagnosis not present

## 2019-03-11 DIAGNOSIS — N183 Chronic kidney disease, stage 3 (moderate): Secondary | ICD-10-CM | POA: Diagnosis not present

## 2019-03-11 DIAGNOSIS — E1122 Type 2 diabetes mellitus with diabetic chronic kidney disease: Secondary | ICD-10-CM | POA: Diagnosis not present

## 2019-03-12 DIAGNOSIS — I6350 Cerebral infarction due to unspecified occlusion or stenosis of unspecified cerebral artery: Secondary | ICD-10-CM | POA: Diagnosis not present

## 2019-03-12 DIAGNOSIS — I129 Hypertensive chronic kidney disease with stage 1 through stage 4 chronic kidney disease, or unspecified chronic kidney disease: Secondary | ICD-10-CM | POA: Diagnosis not present

## 2019-03-12 DIAGNOSIS — E113293 Type 2 diabetes mellitus with mild nonproliferative diabetic retinopathy without macular edema, bilateral: Secondary | ICD-10-CM | POA: Diagnosis not present

## 2019-03-12 DIAGNOSIS — I10 Essential (primary) hypertension: Secondary | ICD-10-CM | POA: Diagnosis not present

## 2019-03-12 DIAGNOSIS — E1165 Type 2 diabetes mellitus with hyperglycemia: Secondary | ICD-10-CM | POA: Diagnosis not present

## 2019-03-12 DIAGNOSIS — F325 Major depressive disorder, single episode, in full remission: Secondary | ICD-10-CM | POA: Diagnosis not present

## 2019-03-12 DIAGNOSIS — E7800 Pure hypercholesterolemia, unspecified: Secondary | ICD-10-CM | POA: Diagnosis not present

## 2019-03-12 DIAGNOSIS — E1122 Type 2 diabetes mellitus with diabetic chronic kidney disease: Secondary | ICD-10-CM | POA: Diagnosis not present

## 2019-03-12 DIAGNOSIS — N183 Chronic kidney disease, stage 3 (moderate): Secondary | ICD-10-CM | POA: Diagnosis not present

## 2019-03-13 DIAGNOSIS — M6281 Muscle weakness (generalized): Secondary | ICD-10-CM | POA: Diagnosis not present

## 2019-03-13 DIAGNOSIS — E1122 Type 2 diabetes mellitus with diabetic chronic kidney disease: Secondary | ICD-10-CM | POA: Diagnosis not present

## 2019-03-13 DIAGNOSIS — K219 Gastro-esophageal reflux disease without esophagitis: Secondary | ICD-10-CM | POA: Diagnosis not present

## 2019-03-13 DIAGNOSIS — I700 Atherosclerosis of aorta: Secondary | ICD-10-CM | POA: Diagnosis not present

## 2019-03-13 DIAGNOSIS — I69398 Other sequelae of cerebral infarction: Secondary | ICD-10-CM | POA: Diagnosis not present

## 2019-03-13 DIAGNOSIS — R2689 Other abnormalities of gait and mobility: Secondary | ICD-10-CM | POA: Diagnosis not present

## 2019-03-13 DIAGNOSIS — E113299 Type 2 diabetes mellitus with mild nonproliferative diabetic retinopathy without macular edema, unspecified eye: Secondary | ICD-10-CM | POA: Diagnosis not present

## 2019-03-13 DIAGNOSIS — N183 Chronic kidney disease, stage 3 (moderate): Secondary | ICD-10-CM | POA: Diagnosis not present

## 2019-03-13 DIAGNOSIS — I129 Hypertensive chronic kidney disease with stage 1 through stage 4 chronic kidney disease, or unspecified chronic kidney disease: Secondary | ICD-10-CM | POA: Diagnosis not present

## 2019-03-14 DIAGNOSIS — E119 Type 2 diabetes mellitus without complications: Secondary | ICD-10-CM | POA: Diagnosis not present

## 2019-03-14 DIAGNOSIS — H524 Presbyopia: Secondary | ICD-10-CM | POA: Diagnosis not present

## 2019-03-14 DIAGNOSIS — H26492 Other secondary cataract, left eye: Secondary | ICD-10-CM | POA: Diagnosis not present

## 2019-03-18 DIAGNOSIS — I129 Hypertensive chronic kidney disease with stage 1 through stage 4 chronic kidney disease, or unspecified chronic kidney disease: Secondary | ICD-10-CM | POA: Diagnosis not present

## 2019-03-18 DIAGNOSIS — E113299 Type 2 diabetes mellitus with mild nonproliferative diabetic retinopathy without macular edema, unspecified eye: Secondary | ICD-10-CM | POA: Diagnosis not present

## 2019-03-18 DIAGNOSIS — M6281 Muscle weakness (generalized): Secondary | ICD-10-CM | POA: Diagnosis not present

## 2019-03-18 DIAGNOSIS — I69398 Other sequelae of cerebral infarction: Secondary | ICD-10-CM | POA: Diagnosis not present

## 2019-03-18 DIAGNOSIS — I700 Atherosclerosis of aorta: Secondary | ICD-10-CM | POA: Diagnosis not present

## 2019-03-18 DIAGNOSIS — R2689 Other abnormalities of gait and mobility: Secondary | ICD-10-CM | POA: Diagnosis not present

## 2019-03-18 DIAGNOSIS — E1122 Type 2 diabetes mellitus with diabetic chronic kidney disease: Secondary | ICD-10-CM | POA: Diagnosis not present

## 2019-03-18 DIAGNOSIS — K219 Gastro-esophageal reflux disease without esophagitis: Secondary | ICD-10-CM | POA: Diagnosis not present

## 2019-03-18 DIAGNOSIS — N183 Chronic kidney disease, stage 3 (moderate): Secondary | ICD-10-CM | POA: Diagnosis not present

## 2019-03-20 DIAGNOSIS — M6281 Muscle weakness (generalized): Secondary | ICD-10-CM | POA: Diagnosis not present

## 2019-03-20 DIAGNOSIS — K219 Gastro-esophageal reflux disease without esophagitis: Secondary | ICD-10-CM | POA: Diagnosis not present

## 2019-03-20 DIAGNOSIS — I700 Atherosclerosis of aorta: Secondary | ICD-10-CM | POA: Diagnosis not present

## 2019-03-20 DIAGNOSIS — I129 Hypertensive chronic kidney disease with stage 1 through stage 4 chronic kidney disease, or unspecified chronic kidney disease: Secondary | ICD-10-CM | POA: Diagnosis not present

## 2019-03-20 DIAGNOSIS — E113299 Type 2 diabetes mellitus with mild nonproliferative diabetic retinopathy without macular edema, unspecified eye: Secondary | ICD-10-CM | POA: Diagnosis not present

## 2019-03-20 DIAGNOSIS — I69398 Other sequelae of cerebral infarction: Secondary | ICD-10-CM | POA: Diagnosis not present

## 2019-03-20 DIAGNOSIS — R2689 Other abnormalities of gait and mobility: Secondary | ICD-10-CM | POA: Diagnosis not present

## 2019-03-20 DIAGNOSIS — E1122 Type 2 diabetes mellitus with diabetic chronic kidney disease: Secondary | ICD-10-CM | POA: Diagnosis not present

## 2019-03-20 DIAGNOSIS — N183 Chronic kidney disease, stage 3 (moderate): Secondary | ICD-10-CM | POA: Diagnosis not present

## 2019-03-21 ENCOUNTER — Other Ambulatory Visit: Payer: Self-pay | Admitting: Pharmacy Technician

## 2019-03-21 DIAGNOSIS — R972 Elevated prostate specific antigen [PSA]: Secondary | ICD-10-CM | POA: Diagnosis not present

## 2019-03-21 DIAGNOSIS — R310 Gross hematuria: Secondary | ICD-10-CM | POA: Diagnosis not present

## 2019-03-21 DIAGNOSIS — N401 Enlarged prostate with lower urinary tract symptoms: Secondary | ICD-10-CM | POA: Diagnosis not present

## 2019-03-21 NOTE — Patient Outreach (Signed)
[place]) Care Management  03/21/2019  Zakir Henner [facility] 1941/01/05 [id number]   Care coordination call placed to [name] patient assistance in regards to patient's Januvia application.  Spoke to [place] who informed they had received the application form and had subsequently mailed out the attestation form on 03/19/2019.  Will outreach patient about the attestation form.  Tyese Finken P. Geovannie Vilar, [place] Management [phone]

## 2019-03-21 NOTE — Patient Outreach (Signed)
[place] [facility]) Care Management  03/21/2019  Ming Mcmannis [facility] 27-Nov-1940 [id number]   ADDENDUM  Successful outreach call placed to patient in regards to [name] application for [place].  Spoke to patient's wife [place]. HIPAA identifiers verified.  Updated patient's wife on the application process. Informed her that [name] had mailed them out another form to fill out. Confirmed patient/patient's wife had number to call when  the letter is received so we can discuss it together.  Will followup with patient in 10-15 business days if call is not returned.  Lila Lufkin P. Saverio Kader, [place] Management [phone]

## 2019-03-26 ENCOUNTER — Ambulatory Visit: Payer: Self-pay | Admitting: Pharmacist

## 2019-03-26 DIAGNOSIS — E113299 Type 2 diabetes mellitus with mild nonproliferative diabetic retinopathy without macular edema, unspecified eye: Secondary | ICD-10-CM | POA: Diagnosis not present

## 2019-03-26 DIAGNOSIS — M6281 Muscle weakness (generalized): Secondary | ICD-10-CM | POA: Diagnosis not present

## 2019-03-26 DIAGNOSIS — N183 Chronic kidney disease, stage 3 (moderate): Secondary | ICD-10-CM | POA: Diagnosis not present

## 2019-03-26 DIAGNOSIS — E1122 Type 2 diabetes mellitus with diabetic chronic kidney disease: Secondary | ICD-10-CM | POA: Diagnosis not present

## 2019-03-26 DIAGNOSIS — I700 Atherosclerosis of aorta: Secondary | ICD-10-CM | POA: Diagnosis not present

## 2019-03-26 DIAGNOSIS — K219 Gastro-esophageal reflux disease without esophagitis: Secondary | ICD-10-CM | POA: Diagnosis not present

## 2019-03-26 DIAGNOSIS — R2689 Other abnormalities of gait and mobility: Secondary | ICD-10-CM | POA: Diagnosis not present

## 2019-03-26 DIAGNOSIS — I69398 Other sequelae of cerebral infarction: Secondary | ICD-10-CM | POA: Diagnosis not present

## 2019-03-26 DIAGNOSIS — I129 Hypertensive chronic kidney disease with stage 1 through stage 4 chronic kidney disease, or unspecified chronic kidney disease: Secondary | ICD-10-CM | POA: Diagnosis not present

## 2019-03-28 ENCOUNTER — Other Ambulatory Visit: Payer: Self-pay | Admitting: *Deleted

## 2019-03-28 ENCOUNTER — Encounter: Payer: Self-pay | Admitting: *Deleted

## 2019-03-28 NOTE — Patient Outreach (Signed)
[facility] [facility]) Care Management  03/28/2019  Ronald Dyer [facility] 27-Nov-1940 [id number]   RN Health CoachMonthly Outreach  Referral Date: 04/12/2017 Referral Source: MD Referral Reason for Referral: Disease Management Education Insurance: [facility] Medicare   Outreach Attempt:  Successful telephone outreach to patient's wife for follow up.  HIPAA verified with wife, Release of Information of file.  Wife reports patient is doing better.  Is working with home health physical therapy and is now ambulating with cane.  Denies any falls in the last month.  Wife reports patient is to begin home health speech therapy as well.  Reports fasting blood sugar was 98 with fasting ranges of 90-120's.  Denies any hypo or hyperglycemic episodes.  Wife states patient was able to attend recent medical appointments and has been started on medication for enlarged prostate.  Appointments:  Wife reports last attended appointment with Dr. Sabra Heck, primary care provider on 12/13/2018 and has scheduled follow up appointment in September.  Plan: RN Health Coach will make next telephone outreach within the month of October.  [place] [phone] [email or web address]

## 2019-04-01 DIAGNOSIS — N183 Chronic kidney disease, stage 3 (moderate): Secondary | ICD-10-CM | POA: Diagnosis not present

## 2019-04-01 DIAGNOSIS — E1122 Type 2 diabetes mellitus with diabetic chronic kidney disease: Secondary | ICD-10-CM | POA: Diagnosis not present

## 2019-04-01 DIAGNOSIS — K219 Gastro-esophageal reflux disease without esophagitis: Secondary | ICD-10-CM | POA: Diagnosis not present

## 2019-04-01 DIAGNOSIS — I69398 Other sequelae of cerebral infarction: Secondary | ICD-10-CM | POA: Diagnosis not present

## 2019-04-01 DIAGNOSIS — M6281 Muscle weakness (generalized): Secondary | ICD-10-CM | POA: Diagnosis not present

## 2019-04-01 DIAGNOSIS — I700 Atherosclerosis of aorta: Secondary | ICD-10-CM | POA: Diagnosis not present

## 2019-04-01 DIAGNOSIS — E113299 Type 2 diabetes mellitus with mild nonproliferative diabetic retinopathy without macular edema, unspecified eye: Secondary | ICD-10-CM | POA: Diagnosis not present

## 2019-04-01 DIAGNOSIS — R2689 Other abnormalities of gait and mobility: Secondary | ICD-10-CM | POA: Diagnosis not present

## 2019-04-01 DIAGNOSIS — I129 Hypertensive chronic kidney disease with stage 1 through stage 4 chronic kidney disease, or unspecified chronic kidney disease: Secondary | ICD-10-CM | POA: Diagnosis not present

## 2019-04-03 DIAGNOSIS — M6281 Muscle weakness (generalized): Secondary | ICD-10-CM | POA: Diagnosis not present

## 2019-04-03 DIAGNOSIS — N183 Chronic kidney disease, stage 3 (moderate): Secondary | ICD-10-CM | POA: Diagnosis not present

## 2019-04-03 DIAGNOSIS — R2689 Other abnormalities of gait and mobility: Secondary | ICD-10-CM | POA: Diagnosis not present

## 2019-04-03 DIAGNOSIS — E113299 Type 2 diabetes mellitus with mild nonproliferative diabetic retinopathy without macular edema, unspecified eye: Secondary | ICD-10-CM | POA: Diagnosis not present

## 2019-04-03 DIAGNOSIS — K219 Gastro-esophageal reflux disease without esophagitis: Secondary | ICD-10-CM | POA: Diagnosis not present

## 2019-04-03 DIAGNOSIS — I69398 Other sequelae of cerebral infarction: Secondary | ICD-10-CM | POA: Diagnosis not present

## 2019-04-03 DIAGNOSIS — E1122 Type 2 diabetes mellitus with diabetic chronic kidney disease: Secondary | ICD-10-CM | POA: Diagnosis not present

## 2019-04-03 DIAGNOSIS — I129 Hypertensive chronic kidney disease with stage 1 through stage 4 chronic kidney disease, or unspecified chronic kidney disease: Secondary | ICD-10-CM | POA: Diagnosis not present

## 2019-04-03 DIAGNOSIS — I700 Atherosclerosis of aorta: Secondary | ICD-10-CM | POA: Diagnosis not present

## 2019-04-04 ENCOUNTER — Other Ambulatory Visit: Payer: Self-pay | Admitting: Pharmacy Technician

## 2019-04-04 DIAGNOSIS — Z85038 Personal history of other malignant neoplasm of large intestine: Secondary | ICD-10-CM | POA: Diagnosis not present

## 2019-04-04 DIAGNOSIS — E1165 Type 2 diabetes mellitus with hyperglycemia: Secondary | ICD-10-CM | POA: Diagnosis not present

## 2019-04-04 DIAGNOSIS — N183 Chronic kidney disease, stage 3 (moderate): Secondary | ICD-10-CM | POA: Diagnosis not present

## 2019-04-04 DIAGNOSIS — I129 Hypertensive chronic kidney disease with stage 1 through stage 4 chronic kidney disease, or unspecified chronic kidney disease: Secondary | ICD-10-CM | POA: Diagnosis not present

## 2019-04-04 DIAGNOSIS — I10 Essential (primary) hypertension: Secondary | ICD-10-CM | POA: Diagnosis not present

## 2019-04-04 DIAGNOSIS — I6350 Cerebral infarction due to unspecified occlusion or stenosis of unspecified cerebral artery: Secondary | ICD-10-CM | POA: Diagnosis not present

## 2019-04-04 DIAGNOSIS — F325 Major depressive disorder, single episode, in full remission: Secondary | ICD-10-CM | POA: Diagnosis not present

## 2019-04-04 DIAGNOSIS — E1122 Type 2 diabetes mellitus with diabetic chronic kidney disease: Secondary | ICD-10-CM | POA: Diagnosis not present

## 2019-04-04 DIAGNOSIS — E113293 Type 2 diabetes mellitus with mild nonproliferative diabetic retinopathy without macular edema, bilateral: Secondary | ICD-10-CM | POA: Diagnosis not present

## 2019-04-04 NOTE — Patient Outreach (Signed)
[place] [facility]) Care Management  04/04/2019  Tarez [facility] 1941/07/27 [id number]  Successful outreach call placed to patient in regards to [name] application for [place].  Spoke to patient's wife Ronald Dyer. HIPAA identifiers verified.  Patient's wife informed they had received the attestation form and had mailed it back last week. Informed patient's wife that I would followup with [name] to inquire when the medication would be sent out. Patient's wife verbalized understanding.  Will followup with [name] in 3-10 business days to inquire if attestation form was received back and inquire when patient could expect the medication.  Callie Bunyard P. Kalany Diekmann, [place] Management [phone]

## 2019-04-08 DIAGNOSIS — I129 Hypertensive chronic kidney disease with stage 1 through stage 4 chronic kidney disease, or unspecified chronic kidney disease: Secondary | ICD-10-CM | POA: Diagnosis not present

## 2019-04-08 DIAGNOSIS — E1122 Type 2 diabetes mellitus with diabetic chronic kidney disease: Secondary | ICD-10-CM | POA: Diagnosis not present

## 2019-04-08 DIAGNOSIS — E113299 Type 2 diabetes mellitus with mild nonproliferative diabetic retinopathy without macular edema, unspecified eye: Secondary | ICD-10-CM | POA: Diagnosis not present

## 2019-04-08 DIAGNOSIS — K219 Gastro-esophageal reflux disease without esophagitis: Secondary | ICD-10-CM | POA: Diagnosis not present

## 2019-04-08 DIAGNOSIS — I69398 Other sequelae of cerebral infarction: Secondary | ICD-10-CM | POA: Diagnosis not present

## 2019-04-08 DIAGNOSIS — I700 Atherosclerosis of aorta: Secondary | ICD-10-CM | POA: Diagnosis not present

## 2019-04-08 DIAGNOSIS — N183 Chronic kidney disease, stage 3 (moderate): Secondary | ICD-10-CM | POA: Diagnosis not present

## 2019-04-08 DIAGNOSIS — M6281 Muscle weakness (generalized): Secondary | ICD-10-CM | POA: Diagnosis not present

## 2019-04-08 DIAGNOSIS — R2689 Other abnormalities of gait and mobility: Secondary | ICD-10-CM | POA: Diagnosis not present

## 2019-04-09 ENCOUNTER — Encounter: Payer: Self-pay | Admitting: *Deleted

## 2019-04-09 ENCOUNTER — Other Ambulatory Visit: Payer: Self-pay | Admitting: Pharmacy Technician

## 2019-04-09 NOTE — Patient Outreach (Signed)
Ronald Dyer [facility]) Care Management  04/09/2019  Ronald Dyer [facility] Dec 20, 1940 [id number]   Care coordination call placed to [name] in regards to patient's application for [place].  Spoke to [place] who informed patient had been APPROVED 04/01/2019-08/21/2019. She informed that [facility] has received the request and the medication was shipped on 04/03/2019.  Will followup with patient in 15-20 business days to confirm medication was received.  Ronald Dyer P. Ronald Dyer, [place] Management [phone]

## 2019-04-14 ENCOUNTER — Ambulatory Visit: Payer: Medicare HMO | Admitting: Diagnostic Neuroimaging

## 2019-04-14 DIAGNOSIS — E1122 Type 2 diabetes mellitus with diabetic chronic kidney disease: Secondary | ICD-10-CM | POA: Diagnosis not present

## 2019-04-14 DIAGNOSIS — I129 Hypertensive chronic kidney disease with stage 1 through stage 4 chronic kidney disease, or unspecified chronic kidney disease: Secondary | ICD-10-CM | POA: Diagnosis not present

## 2019-04-14 DIAGNOSIS — R2689 Other abnormalities of gait and mobility: Secondary | ICD-10-CM | POA: Diagnosis not present

## 2019-04-14 DIAGNOSIS — N183 Chronic kidney disease, stage 3 (moderate): Secondary | ICD-10-CM | POA: Diagnosis not present

## 2019-04-14 DIAGNOSIS — E113299 Type 2 diabetes mellitus with mild nonproliferative diabetic retinopathy without macular edema, unspecified eye: Secondary | ICD-10-CM | POA: Diagnosis not present

## 2019-04-14 DIAGNOSIS — I69398 Other sequelae of cerebral infarction: Secondary | ICD-10-CM | POA: Diagnosis not present

## 2019-04-14 DIAGNOSIS — K219 Gastro-esophageal reflux disease without esophagitis: Secondary | ICD-10-CM | POA: Diagnosis not present

## 2019-04-14 DIAGNOSIS — I700 Atherosclerosis of aorta: Secondary | ICD-10-CM | POA: Diagnosis not present

## 2019-04-14 DIAGNOSIS — M6281 Muscle weakness (generalized): Secondary | ICD-10-CM | POA: Diagnosis not present

## 2019-04-15 DIAGNOSIS — M6281 Muscle weakness (generalized): Secondary | ICD-10-CM | POA: Diagnosis not present

## 2019-04-15 DIAGNOSIS — N183 Chronic kidney disease, stage 3 (moderate): Secondary | ICD-10-CM | POA: Diagnosis not present

## 2019-04-15 DIAGNOSIS — E1122 Type 2 diabetes mellitus with diabetic chronic kidney disease: Secondary | ICD-10-CM | POA: Diagnosis not present

## 2019-04-15 DIAGNOSIS — I69398 Other sequelae of cerebral infarction: Secondary | ICD-10-CM | POA: Diagnosis not present

## 2019-04-15 DIAGNOSIS — I700 Atherosclerosis of aorta: Secondary | ICD-10-CM | POA: Diagnosis not present

## 2019-04-15 DIAGNOSIS — E113299 Type 2 diabetes mellitus with mild nonproliferative diabetic retinopathy without macular edema, unspecified eye: Secondary | ICD-10-CM | POA: Diagnosis not present

## 2019-04-15 DIAGNOSIS — K219 Gastro-esophageal reflux disease without esophagitis: Secondary | ICD-10-CM | POA: Diagnosis not present

## 2019-04-15 DIAGNOSIS — R2689 Other abnormalities of gait and mobility: Secondary | ICD-10-CM | POA: Diagnosis not present

## 2019-04-15 DIAGNOSIS — I129 Hypertensive chronic kidney disease with stage 1 through stage 4 chronic kidney disease, or unspecified chronic kidney disease: Secondary | ICD-10-CM | POA: Diagnosis not present

## 2019-04-17 DIAGNOSIS — R2689 Other abnormalities of gait and mobility: Secondary | ICD-10-CM | POA: Diagnosis not present

## 2019-04-17 DIAGNOSIS — M6281 Muscle weakness (generalized): Secondary | ICD-10-CM | POA: Diagnosis not present

## 2019-04-17 DIAGNOSIS — E113299 Type 2 diabetes mellitus with mild nonproliferative diabetic retinopathy without macular edema, unspecified eye: Secondary | ICD-10-CM | POA: Diagnosis not present

## 2019-04-17 DIAGNOSIS — I700 Atherosclerosis of aorta: Secondary | ICD-10-CM | POA: Diagnosis not present

## 2019-04-17 DIAGNOSIS — E1122 Type 2 diabetes mellitus with diabetic chronic kidney disease: Secondary | ICD-10-CM | POA: Diagnosis not present

## 2019-04-17 DIAGNOSIS — I69398 Other sequelae of cerebral infarction: Secondary | ICD-10-CM | POA: Diagnosis not present

## 2019-04-17 DIAGNOSIS — N183 Chronic kidney disease, stage 3 (moderate): Secondary | ICD-10-CM | POA: Diagnosis not present

## 2019-04-17 DIAGNOSIS — K219 Gastro-esophageal reflux disease without esophagitis: Secondary | ICD-10-CM | POA: Diagnosis not present

## 2019-04-17 DIAGNOSIS — I129 Hypertensive chronic kidney disease with stage 1 through stage 4 chronic kidney disease, or unspecified chronic kidney disease: Secondary | ICD-10-CM | POA: Diagnosis not present

## 2019-04-21 DIAGNOSIS — N183 Chronic kidney disease, stage 3 (moderate): Secondary | ICD-10-CM | POA: Diagnosis not present

## 2019-04-21 DIAGNOSIS — I129 Hypertensive chronic kidney disease with stage 1 through stage 4 chronic kidney disease, or unspecified chronic kidney disease: Secondary | ICD-10-CM | POA: Diagnosis not present

## 2019-04-21 DIAGNOSIS — R2689 Other abnormalities of gait and mobility: Secondary | ICD-10-CM | POA: Diagnosis not present

## 2019-04-21 DIAGNOSIS — E1122 Type 2 diabetes mellitus with diabetic chronic kidney disease: Secondary | ICD-10-CM | POA: Diagnosis not present

## 2019-04-21 DIAGNOSIS — M6281 Muscle weakness (generalized): Secondary | ICD-10-CM | POA: Diagnosis not present

## 2019-04-21 DIAGNOSIS — E113299 Type 2 diabetes mellitus with mild nonproliferative diabetic retinopathy without macular edema, unspecified eye: Secondary | ICD-10-CM | POA: Diagnosis not present

## 2019-04-21 DIAGNOSIS — K219 Gastro-esophageal reflux disease without esophagitis: Secondary | ICD-10-CM | POA: Diagnosis not present

## 2019-04-21 DIAGNOSIS — I69398 Other sequelae of cerebral infarction: Secondary | ICD-10-CM | POA: Diagnosis not present

## 2019-04-21 DIAGNOSIS — I700 Atherosclerosis of aorta: Secondary | ICD-10-CM | POA: Diagnosis not present

## 2019-04-22 ENCOUNTER — Ambulatory Visit: Payer: Self-pay | Admitting: Pharmacist

## 2019-04-23 ENCOUNTER — Other Ambulatory Visit: Payer: Self-pay | Admitting: Pharmacy Technician

## 2019-04-23 DIAGNOSIS — I10 Essential (primary) hypertension: Secondary | ICD-10-CM | POA: Diagnosis not present

## 2019-04-23 DIAGNOSIS — E113299 Type 2 diabetes mellitus with mild nonproliferative diabetic retinopathy without macular edema, unspecified eye: Secondary | ICD-10-CM | POA: Diagnosis not present

## 2019-04-23 DIAGNOSIS — I6930 Unspecified sequelae of cerebral infarction: Secondary | ICD-10-CM | POA: Diagnosis not present

## 2019-04-23 DIAGNOSIS — E1122 Type 2 diabetes mellitus with diabetic chronic kidney disease: Secondary | ICD-10-CM | POA: Diagnosis not present

## 2019-04-23 DIAGNOSIS — M6281 Muscle weakness (generalized): Secondary | ICD-10-CM | POA: Diagnosis not present

## 2019-04-23 DIAGNOSIS — I129 Hypertensive chronic kidney disease with stage 1 through stage 4 chronic kidney disease, or unspecified chronic kidney disease: Secondary | ICD-10-CM | POA: Diagnosis not present

## 2019-04-23 DIAGNOSIS — I69398 Other sequelae of cerebral infarction: Secondary | ICD-10-CM | POA: Diagnosis not present

## 2019-04-23 DIAGNOSIS — N183 Chronic kidney disease, stage 3 (moderate): Secondary | ICD-10-CM | POA: Diagnosis not present

## 2019-04-23 DIAGNOSIS — E7800 Pure hypercholesterolemia, unspecified: Secondary | ICD-10-CM | POA: Diagnosis not present

## 2019-04-23 DIAGNOSIS — K219 Gastro-esophageal reflux disease without esophagitis: Secondary | ICD-10-CM | POA: Diagnosis not present

## 2019-04-23 DIAGNOSIS — R2689 Other abnormalities of gait and mobility: Secondary | ICD-10-CM | POA: Diagnosis not present

## 2019-04-23 DIAGNOSIS — I700 Atherosclerosis of aorta: Secondary | ICD-10-CM | POA: Diagnosis not present

## 2019-04-23 DIAGNOSIS — N401 Enlarged prostate with lower urinary tract symptoms: Secondary | ICD-10-CM | POA: Diagnosis not present

## 2019-04-23 DIAGNOSIS — F325 Major depressive disorder, single episode, in full remission: Secondary | ICD-10-CM | POA: Diagnosis not present

## 2019-04-23 NOTE — Patient Outreach (Signed)
[facility]) Care Management  04/23/2019  Ronald Dyer [facility] [birth year] [id number]  Successful outreach call placed to patient in regards to [name] application for [place].  Spoke to patient's wife, HIPAA identifiers confirmed and verified.  Patient's wife informed that patient received a 90 days supply of medication. Discussed refill process with patient's wife and where to locate the phone number to place refills. Patient's wife verbalized understanding. Patient's wife denied having any other questions or concerns at this time. Confirmed patient had name and number.  Will route note to [place] that patient assistance has been completed and will remove myself from care team.  Luiz Ochoa. Keona Bilyeu, [place] Management [phone]

## 2019-04-24 ENCOUNTER — Other Ambulatory Visit: Payer: Self-pay

## 2019-04-24 ENCOUNTER — Encounter: Payer: Self-pay | Admitting: Neurology

## 2019-04-24 ENCOUNTER — Ambulatory Visit (INDEPENDENT_AMBULATORY_CARE_PROVIDER_SITE_OTHER): Payer: Medicare HMO | Admitting: Neurology

## 2019-04-24 VITALS — BP 118/68 | HR 87 | Temp 98.7°F | Ht 72.0 in | Wt 218.0 lb

## 2019-04-24 DIAGNOSIS — R269 Unspecified abnormalities of gait and mobility: Secondary | ICD-10-CM | POA: Diagnosis not present

## 2019-04-24 DIAGNOSIS — I69398 Other sequelae of cerebral infarction: Secondary | ICD-10-CM

## 2019-04-24 DIAGNOSIS — F0150 Vascular dementia without behavioral disturbance: Secondary | ICD-10-CM

## 2019-04-24 DIAGNOSIS — I69320 Aphasia following cerebral infarction: Secondary | ICD-10-CM

## 2019-04-24 DIAGNOSIS — I69359 Hemiplegia and hemiparesis following cerebral infarction affecting unspecified side: Secondary | ICD-10-CM | POA: Insufficient documentation

## 2019-04-24 MED ORDER — DONEPEZIL HCL 10 MG PO TABS: 10.0000 mg | ORAL_TABLET | Freq: Every day | ORAL | 5 refills | Status: DC

## 2019-04-24 NOTE — Patient Instructions (Signed)
Binswanger Disease Binswanger disease is a rare form of vascular dementia. Dementia is the loss of two or more brain functions, such as:  Memory.  Decision making.  Behavior.  Speaking.  Thinking.  Problem solving. Binswanger disease is associated with damage to the largest and deepest part of the brain (white matter). White matter is made up of nerve fibers that are protected by a mixture of fats and proteins. White matter connects parts of the brain and allows nerves to signal to one another. Binswanger disease is a progressive dementia. Progressive dementia gets worse with time (is degenerative) and is not reversible. What are the causes? This condition may be caused by:  Narrowing and hardening of arteries (atherosclerosis) in the brain. Atherosclerosis can restrict and eventually block blood flow within an artery. When this happens, the brain does not get enough oxygen, and brain tissue dies. When this happens to white matter in the brain, Binswanger disease can develop. This is the most common cause.  Obstruction of a blood vessel in the brain caused by a blood clot from another part of the body that travels to the brain (thromboembolism).  Rare hereditary diseases such as CADASIL (cerebral autosomal dominant arteriopathy with subcortical infarcts and leukoencephalopathy). What increases the risk? The following factors may make you more likely to develop this condition:  Being 70 years old and older.  Smoking.  Having high blood pressure.  Having high cholesterol.  Having diabetes mellitus.  Having heart disease. What are the signs or symptoms? Symptoms of this condition may include:  Short-term memory loss.  Changes in mood or personality.  Depression.  Decreased attention span.  Decreased ability to make decisions.  Behavior problems.  Changes in speech.  Slower movements.  Unsteady movement (gait) and frequent falls.  Loss of bladder control.   Stroke. Symptoms generally get worse over time. New symptoms may develop as the disease progresses. How is this diagnosed? This condition is diagnosed and treated by a health care provider who specializes in diseases of the brain and nervous system (neurologist). Your health care provider will talk with you and your family, friends, or caregivers about your history and symptoms. A thorough medical history will be taken, and you will have a physical exam and tests. Tests may include:  Lab tests, such as blood or urine tests.  Imaging tests, such as a CT scan, a PET scan, or an MRI.  Tests to evaluate brain function. This may include: ? Memory tests. ? Cognitive tests. ? Neuropsychological tests. How is this treated? Treatment for this condition depends on the cause of the dementia. There is no cure for this condition. Medicines may help to manage certain symptoms, such as:  High blood pressure.  Depression.  Irregular heartbeats or rhythms (arrhythmias).  Behavior and memory problems. Your health care provider can help direct you to support groups, organizations, and other health care providers who can help with decisions about your care. Follow these instructions at home: Medicines  To help manage your medicines, use a pill organizer or pill reminder.  Take over-the-counter and prescription medicines only as told by your health care provider.  Avoid taking medicines that can affect thinking, such as some pain medicines or sleeping medicines. Lifestyle  Make healthy lifestyle choices, such as: ? Be physically active as told by your health care provider. ? Do not use any products that contain nicotine or tobacco, such as cigarettes, e-cigarettes, and chewing tobacco. If you need help quitting, ask your  health care provider. ? Practice stress-management techniques when you feel stressed. ? Stay social.  Make sure to get quality sleep. To do this: ? Avoid napping during the  day. ? Keep your sleeping area dark and cool. ? Avoid exercising during the few hours before you go to bed. ? Avoid caffeine products in the evening. Eating and drinking  Drink enough fluid to keep your urine pale yellow.  Do not drink alcohol.  Eat a healthy diet. Safety   Work with your health care provider to determine what you need help with and what your safety needs are.  Talk with your health care provider about whether it is safe for you to drive.  If you were given a bracelet that identifies you as a person with memory loss or tracks your location, make sure to wear it at all times. General instructions  Work with your family to make important decisions, such as advance directives, medical power of attorney, or a living will.  Keep all follow-up visits as told by your health care provider. This is important. Where to find more information  [employer] of Neurological Disorders and Stroke: [employer]Box[employer].it Contact a health care provider if you have:  New symptoms.  A fever.  Problems with swallowing.  Any symptoms of a different illness.  New or worsening confusion.  New or worsening sleepiness. Get help right away if:  You suddenly develop: ? Weakness or numbness in any part of your body. ? Confusion or dizziness. ? Vision changes. ? Difficulty walking. ? Loss of balance or coordination. ? A severe headache.  You have difficulty speaking or understanding words.  You have chest pain.  You have an irregular heartbeat.  You feel depressed or sad, or feel that you want to harm yourself.  Your family members become concerned for your safety. These symptoms may represent a serious problem that is an emergency. Do not wait to see if the symptoms will go away. Get medical help right away. Call your local emergency services (911 in the [place] Do not drive yourself to the hospital. Summary  Binswanger disease is a rare form of vascular dementia.   Binswanger disease is a progressive dementia. Progressive dementia gets worse with time (is degenerative) and is not reversible.  Treatment for this condition depends on the cause of the dementia. There is no cure for this condition, but medicines may help to manage symptoms.  Work with your health care provider to determine what you need help with and what your safety needs are.  Your health care provider can help direct you to support groups, organizations, and other health care providers who can help with decisions about your care. This information is not intended to replace advice given to you by your health care provider. Make sure you discuss any questions you have with your health care provider. Document Released: 08/07/2005 Document Revised: 11/27/2018 Document Reviewed: 06/20/2018 Elsevier Patient Education  2020 [employer].

## 2019-04-24 NOTE — Progress Notes (Signed)
Provider:  Larey Seat, M D  Referring Provider: [name], MD Primary Care Physician:  [name], MD   [place]  Chief Complaint  Patient presents with  . New Patient (Initial Visit)    pt with , rm 10. pt has balance concerns, he has been completing PT/OT/ST. pt is post CVA around the end 2019. balance concerns are related to CVA.     HPI:  Ronald Dyer is a 78 y.o. male of African-American descent and seen face to face in company of his wife. This is a referral rom Dr. [name] at [facility].  The patient suffered a stroke a stroke at home, with slurred speech, word finding difficulties, balance and hemiparesis, he fell many times. His balance and memory remain now affected. There was no trouble with swallowing. He did not present to the Hospital, and his work up has been slowed by Covid. MRI was ordered late last year - but I cannot find a 2019 MRI.  The epic system shows me MRI brain images from 06-10-2014, 07-01-2016, and 04-19-2017. According to wife and patient his changes were worsening, but gradual.   IMPRESSION: MRI brain: No acute intracranial finding. Chronic small-vessel ischemic changes throughout the brain. No sign of active inflammatory disease.  Intracranial MR angiography: Wide patency of the large and medium size vessels. No aneurysm or other significant vascular finding.   Electronically Signed   By: Nelson Chimes M.D.   On: 06/10/2014 13:55  IMPRESSION: 1. Chronically advanced small vessel disease with small, probably clinically silent, acute lacunar infarct in the right frontal lobe subcortical white matter. No associated hemorrhage or mass effect. 2. Otherwise stable MRI appearance of the brain since 2017.  Electronically Signed: By: Genevie Ann M.D. On: 04/19/2017 12:50   There is clearly some evidence for small vessel disease including the lacunar stroke, however there is a lot of fluid effusion at the  ventricular horn, there is no cortical encephalomalacia at the basal ganglion area shows enlarged Velcro wrappings and spaces.  Brain atrophy is generalized chronic cortical encephalomalacia along the posterior left temporal lobe but in no other space.  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  Diabetic retinopathy.  Arteriosclerosis of the aorta, GERD, history of a malignant neoplasm of large intestine, history of falling, history of smoking.  The patient has had already PT, OT, ST.      Review of Systems: Out of a complete 14 system review, the patient complains of only the following symptoms, and all other reviewed systems are negative.  Falling, speech disorder, balance impaired, right dominant patient. Therapist told him heis weaker on the left.     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  Mrs. [place] described her husband sleeping restlessly with jerking, a lot of talking and yelling.  Thrashing and kicking.   REM BD - cogwheeling, [place] in the right more than left.  Walks with a cane.   Social History   Socioeconomic History  . Marital status: Married    Spouse name: Not on file  . Number of children: Not on file  . Years of education: Not on file  . Highest education level: Not on file  Occupational History  . Not on file  Social  Needs  . Financial resource strain: Not hard at all  . Food insecurity    Worry: Never true    Inability: Never true  . Transportation needs    Medical: No    Non-medical: No  Tobacco Use  . Smoking status: Former Smoker    Years: 40.00    Quit date: 09/08/1998    Years since quitting: 20.6  . Smokeless tobacco: Former Network engineer and Sexual  Activity  . Alcohol use: No  . Drug use: No  . Sexual activity: Never  Lifestyle  . Physical activity    Days per week: 0 days    Minutes per session: 0 min  . Stress: Not on file  Relationships  . Social Herbalist on phone: Not on file    Gets together: Not on file    Attends religious service: Not on file    Active member of club or organization: Not on file    Attends meetings of clubs or organizations: Not on file    Relationship status: Not on file  . Intimate partner violence    Fear of current or ex partner: Not on file    Emotionally abused: Not on file    Physically abused: Not on file    Forced sexual activity: Not on file  Other Topics Concern  . Not on file  Social History Narrative  . Not on file    Family History  Problem Relation Age of Onset  . Cancer Father     Past Medical History:  Diagnosis Date  . Atherosclerosis of aorta ([place])   . CKD (chronic kidney disease), stage III ([place])   . COPD (chronic obstructive pulmonary disease) ([place])   . Depression   . Diabetes mellitus without complication ([place])   . Diabetic retinopathy ([place])   . Frequent falls   . GERD (gastroesophageal reflux disease)   . History of rhabdomyolysis   . Hypercholesterolemia   . Hypertension   . Memory loss   . 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 Stroke [facility])     Past Surgical History:  Procedure Laterality Date  . cataracts Bilateral 2011  . HEMICOLECTOMY Right 03/05/2009  . UMBILICAL HERNIA REPAIR  03/05/2009    Current Outpatient Medications  Medication Sig Dispense Refill  . aspirin 81 MG tablet Take 81 mg by mouth daily.    Marland Kitchen atorvastatin (LIPITOR) 10 MG tablet Take 10 mg by mouth 2 (two) times a week.    . donepezil (ARICEPT) 5 MG tablet Take 5 mg by mouth at bedtime.    Marland Kitchen glimepiride (AMARYL) 4 MG tablet 4 mg Daily.    . hydrochlorothiazide (HYDRODIURIL) 25 MG tablet Take 25 mg by mouth  daily.    Marland Kitchen lisinopril (ZESTRIL) 40 MG tablet Take 80 mg by mouth daily.    . Multiple Vitamins-Minerals (MULTIVITAMIN WITH MINERALS) tablet Take 1 tablet by mouth daily.    Marland Kitchen omeprazole (PRILOSEC) 40 MG capsule Take 40 mg by mouth daily.    . pioglitazone (ACTOS) 45 MG tablet Take 45 mg by mouth daily.    . sertraline (ZOLOFT) 100 MG tablet Take 100 mg by mouth daily.    . sitaGLIPtin (JANUVIA) 100 MG tablet Take 100 mg by mouth daily.    . tamsulosin (FLOMAX) 0.4 MG CAPS capsule Take 0.4 mg by mouth daily. Take for 1 month for enlargement of the  prostate    . vitamin B-12 (CYANOCOBALAMIN) 100 MCG tablet Take 50 mcg by mouth daily.     No current facility-administered medications for this visit.     Allergies as of 04/24/2019 - Review Complete 04/24/2019  Allergen Reaction Noted  . Simvastatin  04/09/2019    Vitals: BP 118/68   Pulse 87   Temp 98.7 F (37.1 C)   Ht 6' (1.829 m)   Wt 218 lb (98.9 kg)   BMI 29.57 kg/m  Last Weight:  Wt Readings from Last 1 Encounters:  04/24/19 218 lb (98.9 kg)   Last Height:   Ht Readings from Last 1 Encounters:  04/24/19 6' (1.829 m)    Physical exam:  General: The patient is awake, alert and appears not in acute distress. The patient is  groomed. Head: Normocephalic, atraumatic. Neck is supple. Cardiovascular:  Regular rate and rhythm , without  murmurs or carotid bruit, and without distended neck veins. Respiratory: Lungs are clear to auscultation. Skin:  Without evidence of edema, or rash Trunk: BMI is 29- stooped posture  Poor dentition.  Neurologic exam : The patient is awake and alert, oriented to place and time.  Memory subjective  described as impaired,getting lost, demented. . There is a reduced l attention span & concentration ability. Speech is non-fluent with dysarthria,mild  dysphonia and clear expressive aphasia. Mood and affect are - easily startled.  Cranial nerves: Pupils are equal and briskly reactive to light.  Funduscopic exam deferred, unable to cooperate with eye movement and visual field. Hearing to finger rub intact. Facial sensation intact to fine touch.  Facial motor strength is symmetric and tongue and uvula move midline.  Tongue protrusion tremulous.  Shoulder shrug is abnormal.   Motor exam:  Elevated  tone ,cogwheeling , but normal muscle bulk  in all extremities.  Sensory:  Fine touch, pinprick and vibration were tested in all extremities. He has difficulties to cooperate.   Coordination: Rapid alternating movements in the fingers/hands were affected, ataxia, dysmetria and tremor. sudden jerking movements.   Gait and station: Patient walks with a cane as  device and needs help to rise -  Stance is unsteady without a cane. Carried the cane in the right hand and walked slowly , but took 7 steps to turn ? Marland Kitchen Tandem gait deferred. Romberg testing deferred.   Deep tendon reflexes: in the  upper and lower extremities are symmetrically spastic- Brisk- .there is no clonus, but hyperreflexia.    Assessment:   I Reviewed all 3 brain MRIs with the patient and spouse. All labs available and therapy notes. The couple is unaware of CKD grade 3.     After physical and neurologic examination, review of laboratory studies, imaging, neurophysiology testing and pre-existing records, assessment is that of :   This patient is truly missrepresented as a "balance problem'  work up.   There is advanced neurovascular disease and related cognitive and physical decline, coordination, speech and gait- This is vascular dementia and advanced brain atrophy. A left brain stroke was likely the last of many insults.   the common causes and comb morbidities are all present DM, HTN, and age, male gender, race.  Plan:  Treatment plan and additional workup :  Already in place, lipid, glucose, HTN control. PT, walking with cane/  Aricept is given at only 5 mg / day , patient denies diarrhea or nausea.- Could be increased  to 10 mg. Melatonin at night to help sleep calmer, to dream less  vividly.  No driving.  Patient lives in a one storey home.   Rv with NP stroke in 3-6 month.     Asencion Partridge Daja Shuping MD 04/24/2019

## 2019-04-25 ENCOUNTER — Telehealth: Payer: Self-pay | Admitting: Pharmacist

## 2019-04-25 DIAGNOSIS — F325 Major depressive disorder, single episode, in full remission: Secondary | ICD-10-CM | POA: Diagnosis not present

## 2019-04-25 DIAGNOSIS — I6930 Unspecified sequelae of cerebral infarction: Secondary | ICD-10-CM | POA: Diagnosis not present

## 2019-04-25 DIAGNOSIS — N401 Enlarged prostate with lower urinary tract symptoms: Secondary | ICD-10-CM | POA: Diagnosis not present

## 2019-04-25 DIAGNOSIS — N183 Chronic kidney disease, stage 3 (moderate): Secondary | ICD-10-CM | POA: Diagnosis not present

## 2019-04-25 DIAGNOSIS — E1122 Type 2 diabetes mellitus with diabetic chronic kidney disease: Secondary | ICD-10-CM | POA: Diagnosis not present

## 2019-04-25 DIAGNOSIS — I10 Essential (primary) hypertension: Secondary | ICD-10-CM | POA: Diagnosis not present

## 2019-04-25 DIAGNOSIS — E7800 Pure hypercholesterolemia, unspecified: Secondary | ICD-10-CM | POA: Diagnosis not present

## 2019-04-25 NOTE — Patient Outreach (Signed)
[facility]) Care Management  04/25/2019  [place] 01-28-1941 [id number]  Patient received a 84 day supply of Januvia from [facility] Patient Assistance Program.  As such, his  patient assistance process is complete.  Patient's pharmacy case will be closed. He is still active with [facility].  Elayne Guerin, PharmD, [place] Clinical Pharmacist [phone]

## 2019-05-02 DIAGNOSIS — I129 Hypertensive chronic kidney disease with stage 1 through stage 4 chronic kidney disease, or unspecified chronic kidney disease: Secondary | ICD-10-CM | POA: Diagnosis not present

## 2019-05-02 DIAGNOSIS — N183 Chronic kidney disease, stage 3 unspecified: Secondary | ICD-10-CM | POA: Diagnosis not present

## 2019-05-02 DIAGNOSIS — E113299 Type 2 diabetes mellitus with mild nonproliferative diabetic retinopathy without macular edema, unspecified eye: Secondary | ICD-10-CM | POA: Diagnosis not present

## 2019-05-02 DIAGNOSIS — I69398 Other sequelae of cerebral infarction: Secondary | ICD-10-CM | POA: Diagnosis not present

## 2019-05-02 DIAGNOSIS — R2689 Other abnormalities of gait and mobility: Secondary | ICD-10-CM | POA: Diagnosis not present

## 2019-05-02 DIAGNOSIS — I700 Atherosclerosis of aorta: Secondary | ICD-10-CM | POA: Diagnosis not present

## 2019-05-02 DIAGNOSIS — K219 Gastro-esophageal reflux disease without esophagitis: Secondary | ICD-10-CM | POA: Diagnosis not present

## 2019-05-02 DIAGNOSIS — M6281 Muscle weakness (generalized): Secondary | ICD-10-CM | POA: Diagnosis not present

## 2019-05-02 DIAGNOSIS — E1122 Type 2 diabetes mellitus with diabetic chronic kidney disease: Secondary | ICD-10-CM | POA: Diagnosis not present

## 2019-05-05 DIAGNOSIS — I700 Atherosclerosis of aorta: Secondary | ICD-10-CM | POA: Diagnosis not present

## 2019-05-05 DIAGNOSIS — I69398 Other sequelae of cerebral infarction: Secondary | ICD-10-CM | POA: Diagnosis not present

## 2019-05-05 DIAGNOSIS — R2689 Other abnormalities of gait and mobility: Secondary | ICD-10-CM | POA: Diagnosis not present

## 2019-05-05 DIAGNOSIS — M6281 Muscle weakness (generalized): Secondary | ICD-10-CM | POA: Diagnosis not present

## 2019-05-05 DIAGNOSIS — E1122 Type 2 diabetes mellitus with diabetic chronic kidney disease: Secondary | ICD-10-CM | POA: Diagnosis not present

## 2019-05-05 DIAGNOSIS — I129 Hypertensive chronic kidney disease with stage 1 through stage 4 chronic kidney disease, or unspecified chronic kidney disease: Secondary | ICD-10-CM | POA: Diagnosis not present

## 2019-05-05 DIAGNOSIS — E113299 Type 2 diabetes mellitus with mild nonproliferative diabetic retinopathy without macular edema, unspecified eye: Secondary | ICD-10-CM | POA: Diagnosis not present

## 2019-05-05 DIAGNOSIS — K219 Gastro-esophageal reflux disease without esophagitis: Secondary | ICD-10-CM | POA: Diagnosis not present

## 2019-05-05 DIAGNOSIS — N183 Chronic kidney disease, stage 3 unspecified: Secondary | ICD-10-CM | POA: Diagnosis not present

## 2019-05-07 DIAGNOSIS — E1122 Type 2 diabetes mellitus with diabetic chronic kidney disease: Secondary | ICD-10-CM | POA: Diagnosis not present

## 2019-05-07 DIAGNOSIS — I700 Atherosclerosis of aorta: Secondary | ICD-10-CM | POA: Diagnosis not present

## 2019-05-07 DIAGNOSIS — M6281 Muscle weakness (generalized): Secondary | ICD-10-CM | POA: Diagnosis not present

## 2019-05-07 DIAGNOSIS — E113299 Type 2 diabetes mellitus with mild nonproliferative diabetic retinopathy without macular edema, unspecified eye: Secondary | ICD-10-CM | POA: Diagnosis not present

## 2019-05-07 DIAGNOSIS — R2689 Other abnormalities of gait and mobility: Secondary | ICD-10-CM | POA: Diagnosis not present

## 2019-05-07 DIAGNOSIS — N183 Chronic kidney disease, stage 3 unspecified: Secondary | ICD-10-CM | POA: Diagnosis not present

## 2019-05-07 DIAGNOSIS — I129 Hypertensive chronic kidney disease with stage 1 through stage 4 chronic kidney disease, or unspecified chronic kidney disease: Secondary | ICD-10-CM | POA: Diagnosis not present

## 2019-05-07 DIAGNOSIS — K219 Gastro-esophageal reflux disease without esophagitis: Secondary | ICD-10-CM | POA: Diagnosis not present

## 2019-05-07 DIAGNOSIS — I69398 Other sequelae of cerebral infarction: Secondary | ICD-10-CM | POA: Diagnosis not present

## 2019-05-09 DIAGNOSIS — N183 Chronic kidney disease, stage 3 (moderate): Secondary | ICD-10-CM | POA: Diagnosis not present

## 2019-05-09 DIAGNOSIS — F325 Major depressive disorder, single episode, in full remission: Secondary | ICD-10-CM | POA: Diagnosis not present

## 2019-05-09 DIAGNOSIS — I6350 Cerebral infarction due to unspecified occlusion or stenosis of unspecified cerebral artery: Secondary | ICD-10-CM | POA: Diagnosis not present

## 2019-05-09 DIAGNOSIS — E113293 Type 2 diabetes mellitus with mild nonproliferative diabetic retinopathy without macular edema, bilateral: Secondary | ICD-10-CM | POA: Diagnosis not present

## 2019-05-09 DIAGNOSIS — Z85038 Personal history of other malignant neoplasm of large intestine: Secondary | ICD-10-CM | POA: Diagnosis not present

## 2019-05-09 DIAGNOSIS — E1165 Type 2 diabetes mellitus with hyperglycemia: Secondary | ICD-10-CM | POA: Diagnosis not present

## 2019-05-09 DIAGNOSIS — E1122 Type 2 diabetes mellitus with diabetic chronic kidney disease: Secondary | ICD-10-CM | POA: Diagnosis not present

## 2019-05-09 DIAGNOSIS — I10 Essential (primary) hypertension: Secondary | ICD-10-CM | POA: Diagnosis not present

## 2019-05-09 DIAGNOSIS — I129 Hypertensive chronic kidney disease with stage 1 through stage 4 chronic kidney disease, or unspecified chronic kidney disease: Secondary | ICD-10-CM | POA: Diagnosis not present

## 2019-05-13 DIAGNOSIS — I700 Atherosclerosis of aorta: Secondary | ICD-10-CM | POA: Diagnosis not present

## 2019-05-13 DIAGNOSIS — I129 Hypertensive chronic kidney disease with stage 1 through stage 4 chronic kidney disease, or unspecified chronic kidney disease: Secondary | ICD-10-CM | POA: Diagnosis not present

## 2019-05-13 DIAGNOSIS — I69398 Other sequelae of cerebral infarction: Secondary | ICD-10-CM | POA: Diagnosis not present

## 2019-05-13 DIAGNOSIS — R2689 Other abnormalities of gait and mobility: Secondary | ICD-10-CM | POA: Diagnosis not present

## 2019-05-13 DIAGNOSIS — E113299 Type 2 diabetes mellitus with mild nonproliferative diabetic retinopathy without macular edema, unspecified eye: Secondary | ICD-10-CM | POA: Diagnosis not present

## 2019-05-13 DIAGNOSIS — K219 Gastro-esophageal reflux disease without esophagitis: Secondary | ICD-10-CM | POA: Diagnosis not present

## 2019-05-13 DIAGNOSIS — E1122 Type 2 diabetes mellitus with diabetic chronic kidney disease: Secondary | ICD-10-CM | POA: Diagnosis not present

## 2019-05-13 DIAGNOSIS — M6281 Muscle weakness (generalized): Secondary | ICD-10-CM | POA: Diagnosis not present

## 2019-05-13 DIAGNOSIS — N183 Chronic kidney disease, stage 3 unspecified: Secondary | ICD-10-CM | POA: Diagnosis not present

## 2019-05-15 DIAGNOSIS — I700 Atherosclerosis of aorta: Secondary | ICD-10-CM | POA: Diagnosis not present

## 2019-05-15 DIAGNOSIS — E113299 Type 2 diabetes mellitus with mild nonproliferative diabetic retinopathy without macular edema, unspecified eye: Secondary | ICD-10-CM | POA: Diagnosis not present

## 2019-05-15 DIAGNOSIS — R2689 Other abnormalities of gait and mobility: Secondary | ICD-10-CM | POA: Diagnosis not present

## 2019-05-15 DIAGNOSIS — K219 Gastro-esophageal reflux disease without esophagitis: Secondary | ICD-10-CM | POA: Diagnosis not present

## 2019-05-15 DIAGNOSIS — I69398 Other sequelae of cerebral infarction: Secondary | ICD-10-CM | POA: Diagnosis not present

## 2019-05-15 DIAGNOSIS — E1122 Type 2 diabetes mellitus with diabetic chronic kidney disease: Secondary | ICD-10-CM | POA: Diagnosis not present

## 2019-05-15 DIAGNOSIS — N183 Chronic kidney disease, stage 3 unspecified: Secondary | ICD-10-CM | POA: Diagnosis not present

## 2019-05-15 DIAGNOSIS — M6281 Muscle weakness (generalized): Secondary | ICD-10-CM | POA: Diagnosis not present

## 2019-05-15 DIAGNOSIS — I129 Hypertensive chronic kidney disease with stage 1 through stage 4 chronic kidney disease, or unspecified chronic kidney disease: Secondary | ICD-10-CM | POA: Diagnosis not present

## 2019-05-19 ENCOUNTER — Ambulatory Visit: Payer: Self-pay | Admitting: Pharmacist

## 2019-05-20 ENCOUNTER — Ambulatory Visit: Payer: Medicare HMO | Admitting: Diagnostic Neuroimaging

## 2019-05-20 ENCOUNTER — Encounter

## 2019-05-20 DIAGNOSIS — N183 Chronic kidney disease, stage 3 unspecified: Secondary | ICD-10-CM | POA: Diagnosis not present

## 2019-05-20 DIAGNOSIS — R2689 Other abnormalities of gait and mobility: Secondary | ICD-10-CM | POA: Diagnosis not present

## 2019-05-20 DIAGNOSIS — K219 Gastro-esophageal reflux disease without esophagitis: Secondary | ICD-10-CM | POA: Diagnosis not present

## 2019-05-20 DIAGNOSIS — I69398 Other sequelae of cerebral infarction: Secondary | ICD-10-CM | POA: Diagnosis not present

## 2019-05-20 DIAGNOSIS — M6281 Muscle weakness (generalized): Secondary | ICD-10-CM | POA: Diagnosis not present

## 2019-05-20 DIAGNOSIS — E1122 Type 2 diabetes mellitus with diabetic chronic kidney disease: Secondary | ICD-10-CM | POA: Diagnosis not present

## 2019-05-20 DIAGNOSIS — I700 Atherosclerosis of aorta: Secondary | ICD-10-CM | POA: Diagnosis not present

## 2019-05-20 DIAGNOSIS — I129 Hypertensive chronic kidney disease with stage 1 through stage 4 chronic kidney disease, or unspecified chronic kidney disease: Secondary | ICD-10-CM | POA: Diagnosis not present

## 2019-05-20 DIAGNOSIS — E113299 Type 2 diabetes mellitus with mild nonproliferative diabetic retinopathy without macular edema, unspecified eye: Secondary | ICD-10-CM | POA: Diagnosis not present

## 2019-05-22 DIAGNOSIS — E1122 Type 2 diabetes mellitus with diabetic chronic kidney disease: Secondary | ICD-10-CM | POA: Diagnosis not present

## 2019-05-22 DIAGNOSIS — R2689 Other abnormalities of gait and mobility: Secondary | ICD-10-CM | POA: Diagnosis not present

## 2019-05-22 DIAGNOSIS — N183 Chronic kidney disease, stage 3 unspecified: Secondary | ICD-10-CM | POA: Diagnosis not present

## 2019-05-22 DIAGNOSIS — I129 Hypertensive chronic kidney disease with stage 1 through stage 4 chronic kidney disease, or unspecified chronic kidney disease: Secondary | ICD-10-CM | POA: Diagnosis not present

## 2019-05-22 DIAGNOSIS — I700 Atherosclerosis of aorta: Secondary | ICD-10-CM | POA: Diagnosis not present

## 2019-05-22 DIAGNOSIS — I69398 Other sequelae of cerebral infarction: Secondary | ICD-10-CM | POA: Diagnosis not present

## 2019-05-22 DIAGNOSIS — K219 Gastro-esophageal reflux disease without esophagitis: Secondary | ICD-10-CM | POA: Diagnosis not present

## 2019-05-22 DIAGNOSIS — M6281 Muscle weakness (generalized): Secondary | ICD-10-CM | POA: Diagnosis not present

## 2019-05-22 DIAGNOSIS — E113299 Type 2 diabetes mellitus with mild nonproliferative diabetic retinopathy without macular edema, unspecified eye: Secondary | ICD-10-CM | POA: Diagnosis not present

## 2019-05-23 ENCOUNTER — Encounter: Payer: Self-pay | Admitting: *Deleted

## 2019-05-23 ENCOUNTER — Other Ambulatory Visit: Payer: Self-pay | Admitting: *Deleted

## 2019-05-23 NOTE — Patient Outreach (Signed)
[place] [facility]) Care Management  [place]  05/23/2019   [place] [birth year] [id number]   RN Health Coach Quarterly Outreach   Referral Date: 04/12/2017 Referral Source: MD Referral Reason for Referral: Disease Management Education Insurance: [employer]   Outreach Attempt:  Successful telephone outreach to patient's wife for follow up.  HIPAA verified with wife, Release of Information on file.  Wife states patient is doing better. States he just completed his work with home health physical and speech therapy.  Reports he is ambulating with a cane and denies any more falls since June 2020.  Continues to monitor blood sugars about 3 times a week with ranges of 90-100's.  States patient is now taking Januvia since medication was providing with medication assistance program; and since starting this medication, patient's blood sugars have been better controlled.  Denies any episodes of hyperglycemia.  Recent Hgb A1C increased to 7.8 on 04/25/2019.  Wife reports patient is sleeping some better at nights but still tends to go to bed late, waking up late in the mornings.  Encounter Medications:  Outpatient Encounter Medications as of 05/23/2019  Medication Sig Note  . aspirin 81 MG tablet Take 81 mg by mouth daily. 06/22/2017: Taking 325 mg  . atorvastatin (LIPITOR) 10 MG tablet Take 10 mg by mouth 2 (two) times a week.   . donepezil (ARICEPT) 10 MG tablet Take 1 tablet (10 mg total) by mouth at bedtime.   Marland Kitchen glimepiride (AMARYL) 4 MG tablet 4 mg Daily. 05/23/2019: Per wife take 2 tablets daily  . hydrochlorothiazide (HYDRODIURIL) 25 MG tablet Take 25 mg by mouth daily.   Marland Kitchen lisinopril (ZESTRIL) 40 MG tablet Take 80 mg by mouth daily.   . Multiple Vitamins-Minerals (MULTIVITAMIN WITH MINERALS) tablet Take 1 tablet by mouth daily.   . pioglitazone (ACTOS) 45 MG tablet Take 45 mg by mouth daily. 05/23/2019: Reports taking 30 mg daily  . sertraline (ZOLOFT) 100 MG  tablet Take 100 mg by mouth daily.   . sitaGLIPtin (JANUVIA) 100 MG tablet Take 100 mg by mouth daily.   . tamsulosin (FLOMAX) 0.4 MG CAPS capsule Take 0.4 mg by mouth daily. Take for 1 month for enlargement of the prostate   . vitamin B-12 (CYANOCOBALAMIN) 100 MCG tablet Take 50 mcg by mouth daily.   Marland Kitchen omeprazole (PRILOSEC) 40 MG capsule Take 40 mg by mouth daily. 05/23/2019: Wife reports not taking at this time   No facility-administered encounter medications on file as of 05/23/2019.     Functional Status:  In your present state of health, do you have any difficulty performing the following activities: 03/28/2019  Hearing? Y  Comment mild hearing loss  Vision? N  Difficulty concentrating or making decisions? Y  Comment forgetfull, aphasic from previous stroke  Walking or climbing stairs? Y  Comment increase risk for fall, unsteady gait  Dressing or bathing? N  Doing errands, shopping? Y  Comment wife does Teacher, English as a foreign language and eating ? Y  Comment wife prepares foods  Using the Toilet? N  In the past six months, have you accidently leaked urine? N  Do you have problems with loss of bowel control? N  Managing your Medications? Y  Comment wife manages medications  Managing your Finances? Y  Comment wife pays bills  Housekeeping or managing your Housekeeping? Y  Comment wife does cleaning  Some recent data might be hidden    Fall/Depression Screening: Fall Risk  05/23/2019 04/24/2019 03/28/2019  Falls in  the past year? 1 1 1   Comment No falls since June 2020 - No falls since June 2020  Number falls in past yr: 1 1 1   Comment - - -  Injury with Fall? 0 0 0  Risk for fall due to : History of fall(s);Medication side effect;Mental status change;Impaired balance/gait;Impaired mobility;Impaired vision - History of fall(s);Medication side effect;Mental status change;Impaired balance/gait;Impaired mobility;Impaired vision  Follow up Falls evaluation completed;Education provided;Falls  prevention discussed - Education provided;Falls evaluation completed;Falls prevention discussed   PHQ 2/9 Scores 05/23/2019 05/13/2018 09/05/2017 08/23/2017 04/20/2017 04/18/2017 07/09/2014  PHQ - 2 Score - - 2 - 2 2 0  PHQ- 9 Score - - 3 - 5 5 -  Exception Documentation Other- indicate reason in comment box Other- indicate reason in comment box - Other- indicate reason in comment box - - -  Not completed patient with history of dementia and 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  Role Documenting the Problem One  [place] for Problem One  Active  [facility] Long Term Goal   Patient's wife will report a decrease in A1C by 0.2 points in the next 6 months.  [facility] Long Term Goal Start Date  05/23/19  Interventions for Problem One Long Term Goal  Reviewed current increase in A1C with wife, confirmed patient has recieved Januvia from patient assistance program, reviewed and discussed care plan and goals with wife, encouraged to continue to increase activity as tolerated using cane with ambulation, fall precaution and preventions reviewed and discussed with wife, reviewed medications and indications and encouraged medication compliance, encouraged to keep and attend scheduled medical appointments, encouraged diabetic heart healthy food and drink options, reviewed current glucose readings with wife, reviewed signs and symptoms of hypoglycemia and hyperglycemia  [facility] CM Short Term Goal #1         Appointments:  Attended appointment with primary care provider, Dr. Sabra Heck on 9/2/202 and has scheduled follow up in March 2021.  Attended appointment with neurology, Dr. Brett Fairy on 04/24/2019 and has scheduled follow up on 08/06/2019.  Plan: RN Health Coach will make next  telephone outreach to patient within the month of December if patient has not transitioned to [place]. RN Health Coach will send primary care provider quarterly update.  [place] [phone] [email or web address]

## 2019-07-22 ENCOUNTER — Other Ambulatory Visit: Payer: Self-pay

## 2019-07-22 NOTE — Patient Outreach (Addendum)
[facility]) Care Management  07/22/2019  Yancy Knoble [facility] [birth year] [id number]   Telephone Assessment    Outreach attempt #1 to patient. Spoke with spouse(DPR on file) due to patient;s documented aphasia. She voices that they are doing well. She denies any acute issues or concerns at present.She voices that they were able to go out of town to [place] for Thanksgiving holiday and patient really enjoyed themselves. They are planning to go to [facility] for [employer].  RN CM discussed with spouse COVID-19 safety precautions and guidelines given that patient is in high risk category. She voices that they are adhering to safety measures. Per spouse, patient's appetite remains good and no recent weight changes. Patient has been ambulating primarily with use of cane and no recent falls. Spouse voices that patient goes to see PCP in Feb. She denies any RN CM needs or concerns at present.   THN CM Care Plan Problem One     Most Recent Value  Care Plan Problem One  Knowledge deficit regarding self-management of Diabetes.  Role Documenting the Problem One  [place] for Problem One  Active  THN Long Term Goal   Patient's wife will report a lowering/decrease in A1C elvel from 7.8 over the next 90 days.  THN Long Term Goal Start Date  07/22/19  Interventions for Problem One Long Term Goal  RN CM reviewed and discussed diabetes mgmt and diabetic diet. RNCM assessed for cbg monitoring and abnormal values.   THN CM Short Term Goal #1   Wife will report monitorign cbgs in the home as ordered by MD over the next 60 days.   THN CM Short Term Goal #1 Start Date  07/22/19  Interventions for Short Term Goal #1  RN CM assessed for any barriers to monitroign cbgs. RNCM reinforced education and importance of monitoring to spouse.     THN CM Care Plan Problem Two     Most Recent Value  Care Plan Problem Two  Patient at high risk for falls.   Role Documenting the Problem  Two  Care Management Telephonic [place] for Problem Two  Active  Interventions for Problem Two Long Term Goal   RN CM completed fall/saety eval. RN CM instructed spouse on fall/safety measures and use of DME.  THN Long Term Goal  Patient wil sustain no falls within the next 90 days.   THN Long Term Goal Start Date  07/22/19  [facility] CM Short Term Goal #1   Spouse will report that patient consistently uses assisitve device 100% of the time over the next 60 days.    THN CM Short Term Goal #1 Start Date  07/22/19  Interventions for Short Term Goal #2   RN CM reinforced fall/safety measures including use of assistive devices         Plan: RN CM RN CM discussed with spouse next outreach within the month of February. She gave verbal consent and in agreement with RN CM follow up timeframe. Spouse aware that they may contact RN CM sooner for any issues or concerns.   Enzo Montgomery, RN,BSN,CCM [place] Management Telephonic Care Management Coordinator Direct Phone: [phone] Toll Free: [phone] Fax: [phone]

## 2019-07-23 ENCOUNTER — Ambulatory Visit: Payer: Self-pay

## 2019-08-06 ENCOUNTER — Ambulatory Visit: Payer: Medicare HMO | Admitting: Neurology

## 2019-08-13 DIAGNOSIS — E113299 Type 2 diabetes mellitus with mild nonproliferative diabetic retinopathy without macular edema, unspecified eye: Secondary | ICD-10-CM | POA: Diagnosis not present

## 2019-08-13 DIAGNOSIS — Z85038 Personal history of other malignant neoplasm of large intestine: Secondary | ICD-10-CM | POA: Diagnosis not present

## 2019-08-13 DIAGNOSIS — N183 Chronic kidney disease, stage 3 unspecified: Secondary | ICD-10-CM | POA: Diagnosis not present

## 2019-08-13 DIAGNOSIS — E1165 Type 2 diabetes mellitus with hyperglycemia: Secondary | ICD-10-CM | POA: Diagnosis not present

## 2019-08-13 DIAGNOSIS — E1122 Type 2 diabetes mellitus with diabetic chronic kidney disease: Secondary | ICD-10-CM | POA: Diagnosis not present

## 2019-08-13 DIAGNOSIS — I6350 Cerebral infarction due to unspecified occlusion or stenosis of unspecified cerebral artery: Secondary | ICD-10-CM | POA: Diagnosis not present

## 2019-08-13 DIAGNOSIS — E7800 Pure hypercholesterolemia, unspecified: Secondary | ICD-10-CM | POA: Diagnosis not present

## 2019-08-13 DIAGNOSIS — I129 Hypertensive chronic kidney disease with stage 1 through stage 4 chronic kidney disease, or unspecified chronic kidney disease: Secondary | ICD-10-CM | POA: Diagnosis not present

## 2019-08-13 DIAGNOSIS — I10 Essential (primary) hypertension: Secondary | ICD-10-CM | POA: Diagnosis not present

## 2019-08-26 ENCOUNTER — Ambulatory Visit: Payer: Medicare HMO | Admitting: Neurology

## 2019-09-08 DIAGNOSIS — E1122 Type 2 diabetes mellitus with diabetic chronic kidney disease: Secondary | ICD-10-CM | POA: Diagnosis not present

## 2019-09-10 ENCOUNTER — Telehealth: Payer: Self-pay | Admitting: Neurology

## 2019-09-10 NOTE — Telephone Encounter (Signed)
Ronald Dyer,[place](wife on DPR) has called to inform she received a letter that the donepezil (ARICEPT) 10 MG tablet is not going to be covered by [employer]  Wife is asking for a call to discuss.

## 2019-09-10 NOTE — Telephone Encounter (Signed)
Called the patient wife and advised that I completed a PA prior to calling her which indicated the medication is a covered medication. She states that it may be that 3 mth supply is not covered. I advised that we had only sent for a month supply at a time and that from what I can tell that is covered. She verbalized understanding.

## 2019-09-15 DIAGNOSIS — R635 Abnormal weight gain: Secondary | ICD-10-CM | POA: Diagnosis not present

## 2019-09-15 DIAGNOSIS — R600 Localized edema: Secondary | ICD-10-CM | POA: Diagnosis not present

## 2019-09-15 DIAGNOSIS — E7800 Pure hypercholesterolemia, unspecified: Secondary | ICD-10-CM | POA: Diagnosis not present

## 2019-09-15 DIAGNOSIS — E113213 Type 2 diabetes mellitus with mild nonproliferative diabetic retinopathy with macular edema, bilateral: Secondary | ICD-10-CM | POA: Diagnosis not present

## 2019-09-15 DIAGNOSIS — I10 Essential (primary) hypertension: Secondary | ICD-10-CM | POA: Diagnosis not present

## 2019-09-15 DIAGNOSIS — Z6833 Body mass index (BMI) 33.0-33.9, adult: Secondary | ICD-10-CM | POA: Diagnosis not present

## 2019-09-22 ENCOUNTER — Other Ambulatory Visit: Payer: Self-pay

## 2019-09-22 NOTE — Patient Outreach (Signed)
[facility]) Care Management  09/22/2019  Ronald Dyer [facility] 09-07-1940 [id number]   Telephone Assessment    Outreach attempt # 1 to patient. Spoke with spouse who reports that patient is still asleep. She reports that he has been doing pretty good. He went to see PCP about a week. Spouse states that patient was having some issues with swelling in his legs. MD has told the m to hold taking Actos for 2wks as MD thinks this was the cause. Spouse reports that swelling has decreased and patient improving. She is elevating affected extremity. Appetite remains good to fair. Wgt stable at about 210 lbs. Blood sugar on yesterday was 153. She denies any RN CM needs or concerns at this time.   THN CM Care Plan Problem One     Most Recent Value  Care Plan Problem One  Knowledge deficit regarding self-management of Diabetes.  Role Documenting the Problem One  [place] for Problem One  Active  THN Long Term Goal   Patient's wife will report a lowering/decrease in A1C elvel from 7.8 over the next 90 days.  THN Long Term Goal Start Date  07/22/19  Interventions for Problem One Long Term Goal  RN CM assessed and reviewed cbg monitoring log and values. RN CM completed nutrition eval.   THN CM Short Term Goal #1   Wife will report monitorign cbgs in the home as ordered by MD over the next 60 days.   THN CM Short Term Goal #1 Start Date  07/22/19  [facility] CM Short Term Goal #1 Met Date  09/22/19    [facility] CM Care Plan Problem Two     Most Recent Value  Care Plan Problem Two  Patient at high risk for falls.   Role Documenting the Problem Two  Care Management [place] for Problem Two  Active  Interventions for Problem Two Long Term Goal   RN CM completed fall/safety eval.  THN Long Term Goal  Patient wil sustain no falls within the next 90 days.   THN Long Term Goal Start Date  07/22/19  [facility] CM Short Term Goal #1   Spouse will report that patient  consistently uses assisitve device 100% of the time over the next 60 days.    THN CM Short Term Goal #1 Start Date  07/22/19  Interventions for Short Term Goal #2   RNCM assessed for falls/safety. RN CM discussed safety measures with spouse.      Plan: RN CM discussed with patient next outreach within the month of April. Patient gave verbal consent and in agreement with RN CM follow up and timeframe. Patient aware that they may contact RN CM sooner for any issues or concerns. RN CM will send quarterly update to PCP.   Enzo Montgomery, RN,BSN,CCM [place] Management Telephonic Care Management Coordinator Direct Phone: [phone] Toll Free: [phone] Fax: [phone]

## 2019-09-23 ENCOUNTER — Ambulatory Visit: Payer: Self-pay

## 2019-09-25 ENCOUNTER — Other Ambulatory Visit: Payer: Self-pay | Admitting: Neurology

## 2019-10-02 ENCOUNTER — Other Ambulatory Visit: Payer: Self-pay

## 2019-10-02 ENCOUNTER — Ambulatory Visit: Payer: Medicare HMO | Admitting: Neurology

## 2019-10-02 ENCOUNTER — Encounter: Payer: Self-pay | Admitting: Neurology

## 2019-10-02 VITALS — BP 124/80 | HR 62 | Temp 97.2°F | Ht 72.0 in | Wt 227.0 lb

## 2019-10-02 DIAGNOSIS — R269 Unspecified abnormalities of gait and mobility: Secondary | ICD-10-CM | POA: Diagnosis not present

## 2019-10-02 DIAGNOSIS — I69359 Hemiplegia and hemiparesis following cerebral infarction affecting unspecified side: Secondary | ICD-10-CM

## 2019-10-02 DIAGNOSIS — R410 Disorientation, unspecified: Secondary | ICD-10-CM | POA: Diagnosis not present

## 2019-10-02 DIAGNOSIS — I69320 Aphasia following cerebral infarction: Secondary | ICD-10-CM | POA: Diagnosis not present

## 2019-10-02 DIAGNOSIS — F0150 Vascular dementia without behavioral disturbance: Secondary | ICD-10-CM | POA: Diagnosis not present

## 2019-10-02 DIAGNOSIS — I69398 Other sequelae of cerebral infarction: Secondary | ICD-10-CM

## 2019-10-02 NOTE — Progress Notes (Signed)
Provider:  Larey Seat, M D  Referring Provider: [name], MD Primary Care Physician:  [name], MD   [place]  Chief Complaint  Patient presents with  . Follow-up    pt with wife, rm 72. pt wife states things have been up and down.     HPI:  Ronald Dyer is a 79 y.o. male of African-American descent and seen face to face in company of his wife.  Rv 10-02-2019. Patient is here speaking to himself, having asked me frequently about a Sport and exercise psychologist care service" , he rambles on about treatment with needles.  He appears not fully oriented to place and situation.  His wife stated he is on and off confused.   He is always sleeping now, often 12 hors or more. He is not talking much at home, but talking to others his wife can't see. He is vividly dreaming, yelling, jerking, fighting some invisible attacker.  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  His wife serves him food in the bed- he watches TV in bed 24/ 7. There is no incentive for him to get up, and his wife wants him to be protected from falls and getting lost.  By now, there is less frequent falls but this is due to PT, however he can't keep up his mobility with this lifestyle.     This was a referral rom Dr. [name] at [facility] on 04-24-2019. The patient suffered a stroke a stroke at home, with slurred speech, word finding difficulties, balance and hemiparesis, he fell many times. His balance and memory remain now affected. There was no trouble with swallowing. He did not present to the Hospital, and his work up has been slowed by Covid. MRI was ordered late last year - but I cannot find a 2019 MRI.  The epic system shows me MRI brain images from 06-10-2014, 07-01-2016, and 04-19-2017. According to wife and patient his changes were worsening, but gradual.   IMPRESSION: MRI brain: No acute intracranial finding. Chronic small-vessel ischemic changes throughout the brain. No  sign of active inflammatory disease. Intracranial MR angiography: Wide patency of the large and medium size vessels. No aneurysm or other significant vascular finding.   By: Nelson Chimes M.D.   On: 06/10/2014 13:55  IMPRESSION: 1. Chronically advanced small vessel disease with small, probably clinically silent, acute lacunar infarct in the right frontal lobe subcortical white matter. No associated hemorrhage or mass effect. 2. Otherwise stable MRI appearance of the brain since 2017.  By: Genevie Ann M.D. On: 04/19/2017 12:50 There is clearly some evidence for small vessel disease including the lacunar stroke, however there is a lot of fluid effusion at the ventricular horn, there is no cortical encephalomalacia at the basal ganglion area shows enlarged Velcro wrappings and spaces.  Brain atrophy is generalized chronic cortical encephalomalacia along the posterior left temporal lobe but in no other space.    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  Diabetic retinopathy.  Arteriosclerosis of the aorta, GERD, history of a malignant neoplasm of large intestine, history of falling, history of smoking.  The patient has had already PT, OT, ST.   Review of Systems: Out of a complete 14 system review, the patient complains of only the  following symptoms, and all other reviewed systems are negative.  Falling, speech disorder, balance impaired, right dominant patient. Therapist told him heis weaker on the left.     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  Mrs. Vadito described her husband sleeping restlessly with jerking, a lot of talking and yelling.  Thrashing and kicking.   REM BD - cogwheeling,  [place] in the right more than left.  Walks with a cane.   Social History   Socioeconomic History  . Marital status: Married    Spouse name: Not on file  . Number of children: Not on file  . Years of education: Not on file  . Highest education level: Not on file  Occupational History  . Not on file  Tobacco Use  . Smoking status: Former Smoker    Years: 40.00    Quit date: 09/08/1998    Years since quitting: 21.0  . Smokeless tobacco: Former Network engineer and Sexual Activity  . Alcohol use: No  . Drug use: No  . Sexual activity: Never  Other Topics Concern  . Not on file  Social History Narrative  . Not on file   Social Determinants of Health   Financial Resource Strain: Low Risk   . Difficulty of Paying Living Expenses: Not hard at all  Food Insecurity: No Food Insecurity  . Worried About Charity fundraiser in the Last Year: Never true  . Ran Out of Food in the Last Year: Never true  Transportation Needs: No Transportation Needs  . Lack of Transportation (Medical): No  . Lack of Transportation (Non-Medical): No  Physical Activity: Inactive  . Days of Exercise per Week: 0 days  . Minutes of Exercise per Session: 0 min  Stress:   . Feeling of Stress : Not on file  Social Connections:   . Frequency of Communication with Friends and Family: Not on file  . Frequency of Social Gatherings with Friends and Family: Not on file  . Attends Religious Services: Not on file  . Active Member of Clubs or Organizations: Not on file  . Attends Archivist Meetings: Not on file  . Marital Status: Not on file  Intimate Partner Violence:   . Fear of Current or Ex-Partner: Not on file  . Emotionally Abused: Not on file  . Physically Abused: Not on file  . Sexually Abused: Not on file    Family History  Problem Relation Age of Onset  . Cancer Father     Past Medical History:  Diagnosis Date  . Atherosclerosis of aorta ([place])   . CKD (chronic kidney disease),  stage III   . COPD (chronic obstructive pulmonary disease) ([place])   . Depression   . Diabetes mellitus without complication ([place])   . Diabetic retinopathy ([place])   . Frequent falls   . GERD (gastroesophageal reflux disease)   . History of rhabdomyolysis   . Hypercholesterolemia   . Hypertension   . Memory loss   . 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 Stroke [facility])     Past Surgical History:  Procedure Laterality Date  . cataracts Bilateral 2011  . HEMICOLECTOMY Right 03/05/2009  . UMBILICAL HERNIA REPAIR  03/05/2009    Current Outpatient Medications  Medication Sig Dispense Refill  . aspirin 81 MG tablet Take 81 mg by mouth daily.    Marland Kitchen atorvastatin (LIPITOR) 10 MG  tablet Take 10 mg by mouth 2 (two) times a week.    . donepezil (ARICEPT) 10 MG tablet TAKE 1 TABLET BY MOUTH EVERYDAY AT BEDTIME 90 tablet 1  . gabapentin (NEURONTIN) 100 MG capsule     . glimepiride (AMARYL) 4 MG tablet 4 mg Daily.    . hydrochlorothiazide (HYDRODIURIL) 25 MG tablet Take 25 mg by mouth daily.    Marland Kitchen lisinopril (ZESTRIL) 40 MG tablet Take 80 mg by mouth daily.    . Multiple Vitamins-Minerals (MULTIVITAMIN WITH MINERALS) tablet Take 1 tablet by mouth daily.    . pioglitazone (ACTOS) 30 MG tablet     . sertraline (ZOLOFT) 100 MG tablet Take 100 mg by mouth daily.    . sitaGLIPtin (JANUVIA) 100 MG tablet Take 100 mg by mouth daily.    . tamsulosin (FLOMAX) 0.4 MG CAPS capsule Take 0.4 mg by mouth daily. Take for 1 month for enlargement of the prostate    . vitamin B-12 (CYANOCOBALAMIN) 100 MCG tablet Take 50 mcg by mouth daily.     No current facility-administered medications for this visit.    Allergies as of 10/02/2019 - Review Complete 10/02/2019  Allergen Reaction Noted  . Simvastatin  04/09/2019    Vitals: BP 124/80   Pulse 62   Temp (!) 97.2 F (36.2 C)   Ht 6' (1.829 m)   Wt 227 lb (103 kg)   BMI 30.79 kg/m  Last Weight:    Wt Readings from Last 1 Encounters:  10/02/19 227 lb (103 kg)   Last Height:   Ht Readings from Last 1 Encounters:  10/02/19 6' (1.829 m)    Physical exam:  General: The patient is awake, alert and appears not in acute distress. The patient is  groomed. Head: Normocephalic, atraumatic. Neck is supple. Cardiovascular:  Regular rate and rhythm , without  murmurs or carotid bruit, and without distended neck veins.  Respiratory: Lungs are clear to auscultation. Skin:  Without evidence of edema, or rash- large cyst over the right elbow- almost tennis ball size.  Trunk: BMI is 29- stooped posture    Poor dentition.  Neurologic exam : The patient is awake and alert, but he is barely oriented to place and time.  MMSE - Mini Mental State Exam 10/02/2019  Orientation to time 3  Orientation to Place 4  Registration 3  Attention/ Calculation 5  Recall 2  Language- name 2 objects 2  Language- repeat 1  Language- follow 3 step command 3  Language- read & follow direction 1  Write a sentence 0  Copy design 0  Total score 24    Can't do serial 7s. Has been HS educated.  Memory subjective described as impaired, and he was getting lost, has been vascular demented. There is a reduced attention span & concentration ability.  There is a tangential, non linear thought process.  Speech is non-fluent with dysarthria,mild  dysphonia and clear expressive aphasia.  Mood and affect are - easily startled.  Cranial nerves: no loss of taste or smell.  Pupils are equal and briskly reactive to light. Funduscopic exam deferred, unable to cooperate with eye movement and visual field. Hearing to finger rub intact. Facial sensation intact to fine touch.  Facial motor strength is symmetric and tongue and uvula move midline.  Tongue protrusion tremulous.  Shoulder shrug is abnormal.   Motor exam:  Elevated tone , with clearly progressive cogwheeling , but normal muscle bulk  in all extremities.  Sensory:   Fine  touch, pinprick and vibration were tested in all extremities. He has difficulties to cooperate.   Coordination: Rapid alternating movements in the fingers/hands were affected, ataxia, dysmetria and tremor. sudden jerking movements in sleep, not while awake. .   Gait and station: Patient walks with a cane and needs help to rise -   Stance remains unsteady without a cane- less than during outr last visit. He frequently loses balance.  Carried the cane in the right hand and walked slowly , but took 6steps to turn -  Tandem gait deferred.  Romberg testing deferred.   Deep tendon reflexes: in the upper and lower extremities are symmetrically spastic- Brisk- .there is still no clonus, but hyperreflexia.    Assessment:  He appears not fully oriented to place and situation.  His wife stated he is on and off confused.   He is always sleeping now, often 12 hors or more. He is not talking much at home, but talking to others his wife can't see. He is vividly dreaming, yelling, jerking, fighting some invisible attacker.  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  His wife serves him food in the bed- he watches TV in bed 24/ 7. There is no incentive for him to get up, and his wife wants him to be protected from falls and getting lost.  By now, there is less frequent falls but this is due to PT, however he can't keep up his mobility with this lifestyle.     I Reviewed all 3 brain MRIs with the patient and spouse. All labs available and therapy notes. The couple is unaware of CKD grade 3.     After physical and neurologic examination, review of laboratory studies, imaging, neurophysiology testing and pre-existing records, assessment is that of :   This patient is truly missrepresented as a "balance problem'  work up.   There is advanced neurovascular disease and related cognitive and physical decline, coordination, speech and gait- This is vascular dementia and advanced  brain atrophy.   The left brain stroke was likely the last of many insults and the common causes and comorbidities are all present DM, HTN, and age, male gender, race.    Plan:  Treatment plan and additional workup :  Already in place, lipid, glucose, HTN control. PT, walking with cane/  Aricept is given at only 5 mg / day , patient denies diarrhea or nausea.- Could be increased to 10 mg. Melatonin at night to help sleep calmer, to dream less vividly- that's a repeat recommendation. Other wise, use Trazodone if INSOMNIA would be a concern. This patient has HYPERSOMNIA and REM BD-   No driving. Memory testing in PCP.  Patient lives in a one storey home.   Patient has not received the COVID vaccine and his wife doesn't want it- she felt he has to make the decision , but he can't. HE IS demented.  Please note that the patient tolerates Aricept well at 10 mg.   Rv with NP ( prefer the STROKE team)  in 12 month.  He never has started melatonin after we discussed it in September.   Sleep hygiene discussed he needs routines, rituals , structure. I wish he could attend adult memory care- but under Covid there is no such activity available. As soon as he could go, I would recommend that he does.  Geriatric medical follow up recommended.    Asencion Partridge Jesusita Jocelyn MD 10/02/2019

## 2019-10-02 NOTE — Patient Instructions (Signed)
Vascular Dementia Dementia is a condition in which a person has problems with thinking, memory, and behavior that are severe enough to interfere with daily life. Vascular dementia is a type of dementia. It results from brain damage that is caused by the brain not getting enough blood. This condition may also be called vascular cognitive impairment. What are the causes? Vascular dementia is caused by conditions that lessen blood flow to the brain. Common causes of this condition include:  Multiple small strokes. These may happen without symptoms (silent stroke).  Major stroke.  Damage to small blood vessels in the brain (cerebral small vessel disease). What increases the risk? The following factors may make you more likely to develop this condition:  Having had a stroke.  Having high blood pressure (hypertension) or high cholesterol.  Having a disease that affects the heart or blood vessels.  Smoking.  Having diabetes.  Having metabolic syndrome.  Being obese.  Not being active.  Having depression.  Being over age 45. What are the signs or symptoms? Symptoms can vary from one person to another. Symptoms may be mild or severe depending on the amount of damage and which parts of the brain have been affected. Symptoms may begin suddenly or may develop slowly. Mental symptoms of vascular dementia may include:  Confusion.  Memory problems.  Poor attention and concentration.  Trouble understanding speech.  Depression.  Personality changes.  Trouble recognizing familiar people.  Agitation or aggression.  Paranoia.  Delusions or hallucinations. Physical symptoms of vascular dementia may include:  Weakness.  Poor balance.  Loss of bladder or bowel control (incontinence).  Unsteady walking (gait).  Speaking problems. Behavioral symptoms of vascular dementia may include:  Getting lost in familiar places.  Problems with planning and judgment.  Trouble  following instructions.  Social problems.  Emotional outbursts.  Trouble with daily activities and self-care.  Problems handling money. Symptoms may remain stable, or they may get worse over time. Symptoms of vascular dementia may be similar to those of Alzheimer's disease. The two conditions can occur together (mixed dementia). How is this diagnosed? Your health care provider will consider your medical history and symptoms or changes that are reported by friends and family. Your health care provider will do a physical exam and may order lab tests or other tests that check brain and nervous system function. Tests that may be done include:  Blood tests.  Brain imaging tests.  Tests of movement, speech, and other daily activities (neurological exam).  Tests of memory, thinking, and problem-solving (neuropsychological or neurocognitive testing). There is not a specific test to diagnose vascular dementia. Diagnosis may involve several specialists. These may include:  A health care provider who specializes in the brain and nervous system (neurologist).  A health provider who specializes in understanding how problems in the brain can alter behavior and cognitive function (neuropsychologist). How is this treated? There is no cure for vascular dementia. Brain damage that has already occurred cannot be reversed. Treatment depends on:  How severe the condition is.  Which parts of your brain have been affected.  Your overall health. Treatment measures aim to:  Treat the underlying cause of vascular dementia and manage risk factors. This may include: ? Controlling blood pressure. ? Lowering cholesterol. ? Treating diabetes. ? Quitting smoking. ? Losing weight or maintaining a healthy weight. ? Eating a healthy, balanced diet. ? Getting regular exercise.  Manage symptoms.  Prevent further brain damage.  Improve the person's health and quality of life. Treatment  for dementia may  involve a team of health care providers, including:  A neurologist.  A provider who specializes in disorders of the mind (psychiatrist).  A provider who specializes in helping people learn daily living skills (occupational therapist).  A provider who focuses on speech and language changes (Electrical engineer).  A heart specialist (cardiologist).  A provider who helps people learn how to manage physical changes, such as movement and walking (exercise physiologist or physical therapist). Follow these instructions at home: Lifestyle  People with vascular dementia may need regular help at home or daily care from a family member or home health care worker. Home care for a person with vascular dementia depends on what caused the condition and how severe the symptoms are. General guidelines for caregivers include:  Help the person with dementia remember people, appointments, and daily activities.  Help the person with dementia manage his or her medicines.  Help family and friends learn about ways to communicate with the person with dementia.  Create a safe living space to reduce the risk of injury or falls.  Find a support group to help caregivers and family cope with the effects of dementia.  General instructions  Help the person take over-the-counter and prescription medicines only as told by the health care provider.  Follow the health care provider's instructions for treating the condition that caused the dementia.  Make sure the person keeps all follow-up visits as told by the health care provider. This is important. Contact a health care provider if:  A fever develops.  New behavioral problems develop.  Problems with swallowing develop.  Confusion gets worse.  Sleepiness gets worse. Get help right away if:  Loss of consciousness occurs.  There is a sudden loss of speech, balance, or thinking ability.  New numbness or paralysis occurs.  Sudden, severe headache  occurs.  Vision is lost or suddenly gets worse in one or both eyes. Summary  Vascular dementia is a type of dementia. It results from brain damage that is caused by the brain not getting enough blood.  Vascular dementia is caused by conditions that lessen blood flow to the brain. Common causes of this condition include stroke and damage to small blood vessels in the brain.  Treatment focuses on treating the underlying cause of vascular dementia and managing any risk factors.  People with vascular dementia may need regular help at home or daily care from a family member or home health care worker.  Contact a health care provider if you or your caregiver notice any new symptoms. This information is not intended to replace advice given to you by your health care provider. Make sure you discuss any questions you have with your health care provider. Document Revised: 05/08/2018 Document Reviewed: 05/09/2018 Elsevier Patient Education  [place].

## 2019-10-14 DIAGNOSIS — I6350 Cerebral infarction due to unspecified occlusion or stenosis of unspecified cerebral artery: Secondary | ICD-10-CM | POA: Diagnosis not present

## 2019-10-14 DIAGNOSIS — E1122 Type 2 diabetes mellitus with diabetic chronic kidney disease: Secondary | ICD-10-CM | POA: Diagnosis not present

## 2019-10-14 DIAGNOSIS — F325 Major depressive disorder, single episode, in full remission: Secondary | ICD-10-CM | POA: Diagnosis not present

## 2019-10-14 DIAGNOSIS — I10 Essential (primary) hypertension: Secondary | ICD-10-CM | POA: Diagnosis not present

## 2019-10-14 DIAGNOSIS — Z85038 Personal history of other malignant neoplasm of large intestine: Secondary | ICD-10-CM | POA: Diagnosis not present

## 2019-10-14 DIAGNOSIS — E1165 Type 2 diabetes mellitus with hyperglycemia: Secondary | ICD-10-CM | POA: Diagnosis not present

## 2019-10-14 DIAGNOSIS — E7800 Pure hypercholesterolemia, unspecified: Secondary | ICD-10-CM | POA: Diagnosis not present

## 2019-10-14 DIAGNOSIS — I129 Hypertensive chronic kidney disease with stage 1 through stage 4 chronic kidney disease, or unspecified chronic kidney disease: Secondary | ICD-10-CM | POA: Diagnosis not present

## 2019-10-14 DIAGNOSIS — N183 Chronic kidney disease, stage 3 unspecified: Secondary | ICD-10-CM | POA: Diagnosis not present

## 2019-10-27 DIAGNOSIS — I6930 Unspecified sequelae of cerebral infarction: Secondary | ICD-10-CM | POA: Diagnosis not present

## 2019-10-27 DIAGNOSIS — N183 Chronic kidney disease, stage 3 unspecified: Secondary | ICD-10-CM | POA: Diagnosis not present

## 2019-10-27 DIAGNOSIS — Z7984 Long term (current) use of oral hypoglycemic drugs: Secondary | ICD-10-CM | POA: Diagnosis not present

## 2019-10-27 DIAGNOSIS — Z6833 Body mass index (BMI) 33.0-33.9, adult: Secondary | ICD-10-CM | POA: Diagnosis not present

## 2019-10-27 DIAGNOSIS — F0150 Vascular dementia without behavioral disturbance: Secondary | ICD-10-CM | POA: Diagnosis not present

## 2019-10-27 DIAGNOSIS — E1122 Type 2 diabetes mellitus with diabetic chronic kidney disease: Secondary | ICD-10-CM | POA: Diagnosis not present

## 2019-10-29 DIAGNOSIS — E7800 Pure hypercholesterolemia, unspecified: Secondary | ICD-10-CM | POA: Diagnosis not present

## 2019-10-29 DIAGNOSIS — F325 Major depressive disorder, single episode, in full remission: Secondary | ICD-10-CM | POA: Diagnosis not present

## 2019-10-29 DIAGNOSIS — I129 Hypertensive chronic kidney disease with stage 1 through stage 4 chronic kidney disease, or unspecified chronic kidney disease: Secondary | ICD-10-CM | POA: Diagnosis not present

## 2019-10-29 DIAGNOSIS — Z85038 Personal history of other malignant neoplasm of large intestine: Secondary | ICD-10-CM | POA: Diagnosis not present

## 2019-10-29 DIAGNOSIS — E1165 Type 2 diabetes mellitus with hyperglycemia: Secondary | ICD-10-CM | POA: Diagnosis not present

## 2019-10-29 DIAGNOSIS — I6350 Cerebral infarction due to unspecified occlusion or stenosis of unspecified cerebral artery: Secondary | ICD-10-CM | POA: Diagnosis not present

## 2019-10-29 DIAGNOSIS — F0150 Vascular dementia without behavioral disturbance: Secondary | ICD-10-CM | POA: Diagnosis not present

## 2019-10-29 DIAGNOSIS — I10 Essential (primary) hypertension: Secondary | ICD-10-CM | POA: Diagnosis not present

## 2019-10-29 DIAGNOSIS — E1122 Type 2 diabetes mellitus with diabetic chronic kidney disease: Secondary | ICD-10-CM | POA: Diagnosis not present

## 2019-11-21 ENCOUNTER — Ambulatory Visit: Payer: Self-pay

## 2019-12-01 DIAGNOSIS — F0150 Vascular dementia without behavioral disturbance: Secondary | ICD-10-CM | POA: Diagnosis not present

## 2019-12-01 DIAGNOSIS — I6350 Cerebral infarction due to unspecified occlusion or stenosis of unspecified cerebral artery: Secondary | ICD-10-CM | POA: Diagnosis not present

## 2019-12-01 DIAGNOSIS — E7800 Pure hypercholesterolemia, unspecified: Secondary | ICD-10-CM | POA: Diagnosis not present

## 2019-12-01 DIAGNOSIS — Z85038 Personal history of other malignant neoplasm of large intestine: Secondary | ICD-10-CM | POA: Diagnosis not present

## 2019-12-01 DIAGNOSIS — I10 Essential (primary) hypertension: Secondary | ICD-10-CM | POA: Diagnosis not present

## 2019-12-01 DIAGNOSIS — E1165 Type 2 diabetes mellitus with hyperglycemia: Secondary | ICD-10-CM | POA: Diagnosis not present

## 2019-12-01 DIAGNOSIS — F325 Major depressive disorder, single episode, in full remission: Secondary | ICD-10-CM | POA: Diagnosis not present

## 2019-12-01 DIAGNOSIS — I129 Hypertensive chronic kidney disease with stage 1 through stage 4 chronic kidney disease, or unspecified chronic kidney disease: Secondary | ICD-10-CM | POA: Diagnosis not present

## 2019-12-01 DIAGNOSIS — E1122 Type 2 diabetes mellitus with diabetic chronic kidney disease: Secondary | ICD-10-CM | POA: Diagnosis not present

## 2019-12-08 ENCOUNTER — Other Ambulatory Visit: Payer: Self-pay

## 2019-12-08 NOTE — Patient Outreach (Signed)
[facility]) Care Management  12/08/2019  Ronald Dyer [facility] [birth year] [id number]   Telephone Assessment    Outreach attempt # 1 to patient/spouse. Spoke with spouse due to patient's mental status related to dementia. She reports patient is doing fairly well. He occassionally is abel to talk and say something that "makes sense". Otherwise,he does ramble and make unmeaningful conversation. Spouse continues to report that patient is ambulatory and gets up and walks around outside some. However, he primarily spends most of the day and night lying in bed watching TV. She does report that patient "slipped and fell" last month while in the shower. She had shower seat but loaned it to someone else. RN CM reviewed fall/safety measures and encouraged use of shower chair. Spouse reports she does have san anti-slip mat in tub.  Appetite remains good and no issues or concerns there. Spouse continues to monitor cbg and BP in the home. Blood sugars have bene ranging from the 130s to 160s and BP around the 110s-120s. RN CM discussed with spouse regarding COVID-19 vaccine. She reports that they are not interested and do not wish to get vaccinated. They are adhering to safety guidelines and patient does not interact with a lot of people. She denies any RN CM needs or concerns at this time.     Plan: RN CM discussed with patient next outreach within the month of June. Patient gave verbal consent and in agreement with RN CM follow up and timeframe. Patient aware that they may contact RN CM sooner for any issues or concerns.   Enzo Montgomery, RN,BSN,CCM [place] [facility] [facility] Telephonic Care Management Coordinator Direct Phone: [phone] Toll Free: [phone] Fax: [phone]

## 2019-12-09 ENCOUNTER — Ambulatory Visit: Payer: Self-pay

## 2020-01-02 DIAGNOSIS — E7800 Pure hypercholesterolemia, unspecified: Secondary | ICD-10-CM | POA: Diagnosis not present

## 2020-01-02 DIAGNOSIS — E1165 Type 2 diabetes mellitus with hyperglycemia: Secondary | ICD-10-CM | POA: Diagnosis not present

## 2020-01-02 DIAGNOSIS — E1122 Type 2 diabetes mellitus with diabetic chronic kidney disease: Secondary | ICD-10-CM | POA: Diagnosis not present

## 2020-01-02 DIAGNOSIS — F0150 Vascular dementia without behavioral disturbance: Secondary | ICD-10-CM | POA: Diagnosis not present

## 2020-01-02 DIAGNOSIS — F325 Major depressive disorder, single episode, in full remission: Secondary | ICD-10-CM | POA: Diagnosis not present

## 2020-01-02 DIAGNOSIS — I129 Hypertensive chronic kidney disease with stage 1 through stage 4 chronic kidney disease, or unspecified chronic kidney disease: Secondary | ICD-10-CM | POA: Diagnosis not present

## 2020-01-02 DIAGNOSIS — I10 Essential (primary) hypertension: Secondary | ICD-10-CM | POA: Diagnosis not present

## 2020-01-02 DIAGNOSIS — Z85038 Personal history of other malignant neoplasm of large intestine: Secondary | ICD-10-CM | POA: Diagnosis not present

## 2020-01-02 DIAGNOSIS — I6350 Cerebral infarction due to unspecified occlusion or stenosis of unspecified cerebral artery: Secondary | ICD-10-CM | POA: Diagnosis not present

## 2020-01-11 ENCOUNTER — Emergency Department (HOSPITAL_COMMUNITY)
Admission: EM | Admit: 2020-01-11 | Discharge: 2020-01-11 | Disposition: A | Discharge status: Home / Self Care | Payer: Medicare HMO | Attending: Emergency Medicine | Admitting: Emergency Medicine

## 2020-01-11 ENCOUNTER — Emergency Department (HOSPITAL_COMMUNITY): Payer: Medicare HMO

## 2020-01-11 ENCOUNTER — Encounter (HOSPITAL_COMMUNITY): Payer: Self-pay | Admitting: Emergency Medicine

## 2020-01-11 ENCOUNTER — Other Ambulatory Visit: Payer: Self-pay

## 2020-01-11 DIAGNOSIS — R0602 Shortness of breath: Secondary | ICD-10-CM | POA: Diagnosis not present

## 2020-01-11 DIAGNOSIS — I129 Hypertensive chronic kidney disease with stage 1 through stage 4 chronic kidney disease, or unspecified chronic kidney disease: Secondary | ICD-10-CM | POA: Insufficient documentation

## 2020-01-11 DIAGNOSIS — R296 Repeated falls: Secondary | ICD-10-CM | POA: Diagnosis not present

## 2020-01-11 DIAGNOSIS — N183 Chronic kidney disease, stage 3 unspecified: Secondary | ICD-10-CM | POA: Diagnosis not present

## 2020-01-11 DIAGNOSIS — R531 Weakness: Secondary | ICD-10-CM | POA: Insufficient documentation

## 2020-01-11 DIAGNOSIS — E1122 Type 2 diabetes mellitus with diabetic chronic kidney disease: Secondary | ICD-10-CM | POA: Insufficient documentation

## 2020-01-11 DIAGNOSIS — M546 Pain in thoracic spine: Secondary | ICD-10-CM | POA: Diagnosis present

## 2020-01-11 DIAGNOSIS — M62830 Muscle spasm of back: Secondary | ICD-10-CM | POA: Diagnosis not present

## 2020-01-11 DIAGNOSIS — Z85038 Personal history of other malignant neoplasm of large intestine: Secondary | ICD-10-CM | POA: Diagnosis not present

## 2020-01-11 DIAGNOSIS — K8020 Calculus of gallbladder without cholecystitis without obstruction: Secondary | ICD-10-CM | POA: Diagnosis not present

## 2020-01-11 DIAGNOSIS — Z87891 Personal history of nicotine dependence: Secondary | ICD-10-CM | POA: Diagnosis not present

## 2020-01-11 LAB — BASIC METABOLIC PANEL
Anion gap: 10 (ref 5–15)
BUN: 20 mg/dL (ref 8–23)
CO2: 24 mmol/L (ref 22–32)
Calcium: 9.7 mg/dL (ref 8.9–10.3)
Chloride: 106 mmol/L (ref 98–111)
Creatinine, Ser: 1.71 mg/dL — ABNORMAL HIGH (ref 0.61–1.24)
GFR calc Af Amer: 43 mL/min — ABNORMAL LOW (ref >60)
GFR calc non Af Amer: 37 mL/min — ABNORMAL LOW (ref >60)
Glucose, Bld: 286 mg/dL — ABNORMAL HIGH (ref 70–99)
Potassium: 3.8 mmol/L (ref 3.5–5.1)
Sodium: 140 mmol/L (ref 135–145)

## 2020-01-11 LAB — CBC
HCT: 40.2 % (ref 39.0–52.0)
Hemoglobin: 13.3 g/dL (ref 13.0–17.0)
MCH: 26.7 pg (ref 26.0–34.0)
MCHC: 33.1 g/dL (ref 30.0–36.0)
MCV: 80.7 fL (ref 80.0–100.0)
Platelets: 168 10*3/uL (ref 150–400)
RBC: 4.98 MIL/uL (ref 4.22–5.81)
RDW: 14.5 % (ref 11.5–15.5)
WBC: 7.1 10*3/uL (ref 4.0–10.5)
nRBC: 0 % (ref 0.0–0.2)

## 2020-01-11 LAB — BRAIN NATRIURETIC PEPTIDE: B Natriuretic Peptide: 34.9 pg/mL (ref 0.0–100.0)

## 2020-01-11 LAB — I-STAT CHEM 8, ED
BUN: 26 mg/dL — ABNORMAL HIGH (ref 8–23)
Calcium, Ion: 1.22 mmol/L (ref 1.15–1.40)
Chloride: 104 mmol/L (ref 98–111)
Creatinine, Ser: 1.8 mg/dL — ABNORMAL HIGH (ref 0.61–1.24)
Glucose, Bld: 252 mg/dL — ABNORMAL HIGH (ref 70–99)
HCT: 38 % — ABNORMAL LOW (ref 39.0–52.0)
Hemoglobin: 12.9 g/dL — ABNORMAL LOW (ref 13.0–17.0)
Potassium: 3.8 mmol/L (ref 3.5–5.1)
Sodium: 140 mmol/L (ref 135–145)
TCO2: 28 mmol/L (ref 22–32)

## 2020-01-11 LAB — TROPONIN I (HIGH SENSITIVITY)
Troponin I (High Sensitivity): 6 ng/L (ref <18)
Troponin I (High Sensitivity): 6 ng/L (ref <18)

## 2020-01-11 MED ORDER — LIDOCAINE 5 % EX PTCH: 1.0000 | MEDICATED_PATCH | CUTANEOUS | 0 refills | Status: DC

## 2020-01-11 MED ORDER — ALUM & MAG HYDROXIDE-SIMETH 200-200-20 MG/5ML PO SUSP: 30.0000 mL | Freq: Once | ORAL | Status: DC

## 2020-01-11 MED ORDER — IOHEXOL 350 MG/ML SOLN
80.0000 mL | Freq: Once | INTRAVENOUS | Status: AC | PRN
  Administered 2020-01-11: 80 mL via INTRAVENOUS

## 2020-01-11 MED ORDER — SODIUM CHLORIDE 0.9% FLUSH
3.0000 mL | Freq: Once | INTRAVENOUS | Status: AC
  Administered 2020-01-11: 3 mL via INTRAVENOUS

## 2020-01-11 NOTE — ED Triage Notes (Signed)
C/o SOB and sharp pain to center of back since noon.  States it feels like an arrow is going into back.

## 2020-01-11 NOTE — Discharge Instructions (Addendum)
Contact a health care provider if: Your pain is not helped by medicine. Your pain or stiffness is getting worse. You develop pain or stiffness in your neck or lower back. Get help right away if you: Have shortness of breath. Have chest pain. Develop numbness or weakness in your legs or arms. Have involuntary loss of urine (urinary incontinence).

## 2020-01-11 NOTE — ED Notes (Signed)
Patient verbalizes understanding of discharge instructions. Opportunity for questioning and answers were provided. Armband removed by staff, pt discharged from ED in wheelchair to home.   

## 2020-01-11 NOTE — ED Provider Notes (Signed)
[facility] EMERGENCY DEPARTMENT Provider Note   CSN: [id number] Arrival date & time: 01/11/20  1544     History Chief Complaint  Patient presents with  . Back Pain  . Shortness of Breath    Ronald Dyer is a 79 y.o. male with a past medical history of dementia, previous stroke with right-sided weakness, diabetes, frequent falls, balance issues.  He is here with his wife at bedside.  Patient had onset of severe sharp pain mid back and right below his right shoulder blade.  He states that it came on when he was trying to get out of bed and he had inability to get up out of the bed because it hurts too much.  His pain is gone now.  He does not know of anything seems to make it worse or better.  His wife says that he has been otherwise well, he denies chest pain, shortness of breath, nausea, vomiting, diaphoresis, fevers or chills  HPI     Past Medical History:  Diagnosis Date  . Atherosclerosis of aorta ([place])   . CKD (chronic kidney disease), stage III   . COPD (chronic obstructive pulmonary disease) ([place])   . Depression   . Diabetes mellitus without complication ([place])   . Diabetic retinopathy ([place])   . Frequent falls   . GERD (gastroesophageal reflux disease)   . History of rhabdomyolysis   . Hypercholesterolemia   . Hypertension   . Memory loss   . 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 Stroke [facility])     Patient Active Problem List   Diagnosis Date Noted  . Confusion and disorientation 10/02/2019  . 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 ([place]) 04/24/2019  . Aphasia as late effect of cerebrovascular accident 04/24/2019  . Gait disturbance, post-stroke 04/24/2019  . 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 CVA (cerebral vascular accident) ([place]) 06/22/2017  . Colon cancer ([place]) 03/25/2014  .  DIABETES MELLITUS II, UNCOMPLICATED 22/09/5425  . DEPRESSION, MAJOR, RECURRENT 10/18/2006  . ALCOHOL ABUSE, UNSPECIFIED 10/18/2006  . COPD 10/18/2006  . GASTROESOPHAGEAL REFLUX, NO ESOPHAGITIS 10/18/2006    Past Surgical History:  Procedure Laterality Date  . cataracts Bilateral 2011  . HEMICOLECTOMY Right 03/05/2009  . UMBILICAL HERNIA REPAIR  03/05/2009       Family History  Problem Relation Age of Onset  . Cancer Father     Social History   Tobacco Use  . Smoking status: Former Smoker    Years: 40.00    Quit date: 09/08/1998    Years since quitting: 21.3  . Smokeless tobacco: Former Network engineer Use Topics  . Alcohol use: No  . Drug use: No    Home Medications Prior to Admission medications   Medication Sig Start Date End Date Taking? Authorizing Provider  aspirin 81 MG tablet Take 81 mg by mouth daily.    [provider]  atorvastatin (LIPITOR) 10 MG tablet Take 10 mg by mouth 2 (two) times a week.    [provider]  donepezil (ARICEPT) 10 MG tablet TAKE 1 TABLET BY MOUTH EVERYDAY AT BEDTIME 09/25/19   Dohmeier, Asencion Partridge, MD  gabapentin (NEURONTIN) 100 MG capsule  09/09/19   [provider]  glimepiride (AMARYL) 4 MG tablet 4 mg Daily. 10/16/11   [provider]  hydrochlorothiazide (HYDRODIURIL) 25 MG tablet Take 25 mg  by mouth daily.    [provider]  lidocaine (LIDODERM) 5 % Place 1 patch onto the skin daily. Remove & Discard patch within 12 hours or as directed by MD 01/11/20   Margarita Mail, PA-C  lisinopril (ZESTRIL) 40 MG tablet Take 80 mg by mouth daily. 02/02/19   [provider]  Multiple Vitamins-Minerals (MULTIVITAMIN WITH MINERALS) tablet Take 1 tablet by mouth daily.    [provider]  pioglitazone (ACTOS) 30 MG tablet  09/13/19   [provider]  sertraline (ZOLOFT) 100 MG tablet Take 100 mg by mouth daily.    [provider]  sitaGLIPtin (JANUVIA) 100 MG tablet Take 100  mg by mouth daily.    [provider]  tamsulosin (FLOMAX) 0.4 MG CAPS capsule Take 0.4 mg by mouth daily. Take for 1 month for enlargement of the prostate    [provider]  vitamin B-12 (CYANOCOBALAMIN) 100 MCG tablet Take 50 mcg by mouth daily.    [provider]    Allergies    Simvastatin  Review of Systems   Review of Systems  Unable to perform ROS: Dementia    Physical Exam Updated Vital Signs BP (!) 148/75   Pulse (!) 55   Temp 98.5 F (36.9 C) (Oral)   Resp 17   Ht 6' (1.829 m)   Wt 92.1 kg   SpO2 98%   BMI 27.53 kg/m   Physical Exam Vitals and nursing note reviewed.  Constitutional:      General: He is not in acute distress.    Appearance: He is well-developed. He is not diaphoretic.  HENT:     Head: Normocephalic and atraumatic.  Eyes:     General: No scleral icterus.    Conjunctiva/sclera: Conjunctivae normal.  Cardiovascular:     Rate and Rhythm: Normal rate and regular rhythm.     Heart sounds: Normal heart sounds.  Pulmonary:     Effort: Pulmonary effort is normal. No respiratory distress.     Breath sounds: Normal breath sounds.  Abdominal:     Palpations: Abdomen is soft.     Tenderness: There is no abdominal tenderness.  Musculoskeletal:     Cervical back: Normal range of motion and neck supple.       Back:  Skin:    General: Skin is warm and dry.  Neurological:     Mental Status: He is alert.     Comments: 4 out of 5 strength in the right upper and lower extremity, 5 out of 5 strength of the left upper and lower extremity  Psychiatric:        Behavior: Behavior normal.     ED Results / Procedures / Treatments   Labs (all labs ordered are listed, but only abnormal results are displayed) Labs Reviewed  BASIC METABOLIC PANEL - Abnormal; Notable for the following components:      Result Value   Glucose, Bld 286 (*)    Creatinine, Ser 1.71 (*)    GFR calc non Af Amer 37 (*)    GFR calc Af Amer 43 (*)    All  other components within normal limits  I-STAT CHEM 8, ED - Abnormal; Notable for the following components:   BUN 26 (*)    Creatinine, Ser 1.80 (*)    Glucose, Bld 252 (*)    Hemoglobin 12.9 (*)    HCT 38.0 (*)    All other components within normal limits  CBC  BRAIN NATRIURETIC PEPTIDE  TROPONIN I (HIGH SENSITIVITY)  TROPONIN I (HIGH SENSITIVITY)    EKG EKG Interpretation  Date/Time:  "Sunday Jan 11 2020 16:30:40 EDT Ventricular Rate:  71 PR Interval:  158 QRS Duration: 80 QT Interval:  382 QTC Calculation: 415 R Axis:   -14 Text Interpretation: Normal sinus rhythm Minimal voltage criteria for LVH, may be normal variant ( R in aVL ) Nonspecific T wave abnormality Abnormal ECG No significant change since last tracing Confirmed by Haviland, Julie ([id number]) on 01/11/2020 7:49:40 PM   Radiology No results found.  Procedures Procedures (including critical care time)  Medications Ordered in ED Medications  sodium chloride flush (NS) 0.9 % injection 3 mL (3 mLs Intravenous Given 01/11/20 1916)  iohexol (OMNIPAQUE) 350 MG/ML injection 80 mL (80 mLs Intravenous Contrast Given 01/11/20 1829)    ED Course  I have reviewed the triage vital signs and the nursing notes.  Pertinent labs & imaging results that were available during my care of the patient were reviewed by me and considered in my medical decision making (see chart for details).    MDM Rules/Calculators/A&P                     Final Clinical Impression(s) / ED Diagnoses Final diagnoses:  Back muscle spasm    CC: .chestpain /back pain VS: BP (!) 148/75   Pulse (!) 55   Temp 98.5 F (36.9 C) (Oral)   Resp 17   Ht 6' (1.829 m)   Wt 92.1 kg   SpO2 98%   BMI 27.53 kg/m   HX:History is gathered by patient  and wife. Previous records obtained and reviewed. DDX:The patient's complaint of chest pain involves an extensive number of diagnostic and treatment options, and is a complaint that carries with it a high risk of  complications, morbidity, and potential mortality. Given the large differential diagnosis, medical decision making is of high complexity. The emergent differential diagnosis of chest pain includes: Acute coronary syndrome, pericarditis, aortic dissection, pulmonary embolism, tension pneumothorax, pneumonia, and esophageal rupture.  Labs: I ordered reviewed and interpreted labs which include CBC without abnormality.  Elev creatinine, glucose is elevated.  BMP within normal limits, troponin without abnormality. Imaging: I ordered and reviewed images which included cxr, CTA . I independently visualized and interpreted all imaging. There are no acute, significant findings on today's images. EKG: NSR 71 unchanged  Consults:N/A MDM:Patient with reproducible shoulder pain. No evidence of ACS. Suspect muscle spasm. Patient seen in shared visit with attending physician. Who agrees with assessment, work up , treatment, and plan for discharge    The patient appears reasonably screened and/or stabilized for discharge and I doubt any other medical condition or other EMC requiring further screening, evaluation, or treatment in the ED at this time prior to discharge. I have discussed lab and/or imaging findings with the patient and answered all questions/concerns to the best of my ability.I have discussed return precautions and OP follow up.     Rx / DC Orders ED Discharge Orders         Ordered    lidocaine (LIDODERM) 5 %  Every 24 hours     05" /23/21 2004           Margarita Mail, PA-C 01/14/20 [id number]    Isla Pence, MD 01/15/20 [phone]

## 2020-01-14 ENCOUNTER — Telehealth: Payer: Self-pay | Admitting: *Deleted

## 2020-01-14 DIAGNOSIS — E1122 Type 2 diabetes mellitus with diabetic chronic kidney disease: Secondary | ICD-10-CM | POA: Diagnosis not present

## 2020-01-14 DIAGNOSIS — M5489 Other dorsalgia: Secondary | ICD-10-CM | POA: Diagnosis not present

## 2020-01-14 DIAGNOSIS — Z6831 Body mass index (BMI) 31.0-31.9, adult: Secondary | ICD-10-CM | POA: Diagnosis not present

## 2020-01-14 NOTE — Telephone Encounter (Signed)
RNCM called regarding pt follow-up appointment.  Office confirms that Pt is scheduled to be seen today at 19.

## 2020-02-03 ENCOUNTER — Emergency Department (HOSPITAL_COMMUNITY): Payer: Medicare HMO

## 2020-02-03 ENCOUNTER — Inpatient Hospital Stay (HOSPITAL_COMMUNITY)
Admission: EM | Admit: 2020-02-03 | Discharge: 2020-02-06 | DRG: 312 | Disposition: A | Discharge status: Home / Self Care | Payer: Medicare HMO | Attending: Internal Medicine | Admitting: Internal Medicine

## 2020-02-03 DIAGNOSIS — Z7984 Long term (current) use of oral hypoglycemic drugs: Secondary | ICD-10-CM

## 2020-02-03 DIAGNOSIS — F329 Major depressive disorder, single episode, unspecified: Secondary | ICD-10-CM | POA: Diagnosis present

## 2020-02-03 DIAGNOSIS — J439 Emphysema, unspecified: Secondary | ICD-10-CM | POA: Diagnosis not present

## 2020-02-03 DIAGNOSIS — E1122 Type 2 diabetes mellitus with diabetic chronic kidney disease: Secondary | ICD-10-CM | POA: Diagnosis present

## 2020-02-03 DIAGNOSIS — I700 Atherosclerosis of aorta: Secondary | ICD-10-CM | POA: Diagnosis present

## 2020-02-03 DIAGNOSIS — J449 Chronic obstructive pulmonary disease, unspecified: Secondary | ICD-10-CM | POA: Diagnosis not present

## 2020-02-03 DIAGNOSIS — R270 Ataxia, unspecified: Secondary | ICD-10-CM | POA: Diagnosis not present

## 2020-02-03 DIAGNOSIS — E1169 Type 2 diabetes mellitus with other specified complication: Secondary | ICD-10-CM | POA: Diagnosis not present

## 2020-02-03 DIAGNOSIS — D509 Iron deficiency anemia, unspecified: Secondary | ICD-10-CM | POA: Diagnosis present

## 2020-02-03 DIAGNOSIS — Z7189 Other specified counseling: Secondary | ICD-10-CM | POA: Diagnosis not present

## 2020-02-03 DIAGNOSIS — Z79899 Other long term (current) drug therapy: Secondary | ICD-10-CM | POA: Diagnosis not present

## 2020-02-03 DIAGNOSIS — E785 Hyperlipidemia, unspecified: Secondary | ICD-10-CM | POA: Diagnosis not present

## 2020-02-03 DIAGNOSIS — I69320 Aphasia following cerebral infarction: Secondary | ICD-10-CM

## 2020-02-03 DIAGNOSIS — K219 Gastro-esophageal reflux disease without esophagitis: Secondary | ICD-10-CM | POA: Diagnosis present

## 2020-02-03 DIAGNOSIS — R61 Generalized hyperhidrosis: Secondary | ICD-10-CM | POA: Diagnosis not present

## 2020-02-03 DIAGNOSIS — I69351 Hemiplegia and hemiparesis following cerebral infarction affecting right dominant side: Secondary | ICD-10-CM | POA: Diagnosis not present

## 2020-02-03 DIAGNOSIS — E11319 Type 2 diabetes mellitus with unspecified diabetic retinopathy without macular edema: Secondary | ICD-10-CM | POA: Diagnosis present

## 2020-02-03 DIAGNOSIS — N400 Enlarged prostate without lower urinary tract symptoms: Secondary | ICD-10-CM | POA: Diagnosis present

## 2020-02-03 DIAGNOSIS — N183 Chronic kidney disease, stage 3 unspecified: Secondary | ICD-10-CM | POA: Diagnosis present

## 2020-02-03 DIAGNOSIS — Z87891 Personal history of nicotine dependence: Secondary | ICD-10-CM | POA: Diagnosis not present

## 2020-02-03 DIAGNOSIS — R296 Repeated falls: Secondary | ICD-10-CM | POA: Diagnosis present

## 2020-02-03 DIAGNOSIS — E1165 Type 2 diabetes mellitus with hyperglycemia: Secondary | ICD-10-CM | POA: Diagnosis present

## 2020-02-03 DIAGNOSIS — R001 Bradycardia, unspecified: Secondary | ICD-10-CM | POA: Diagnosis not present

## 2020-02-03 DIAGNOSIS — I361 Nonrheumatic tricuspid (valve) insufficiency: Secondary | ICD-10-CM | POA: Diagnosis not present

## 2020-02-03 DIAGNOSIS — F32A Depression, unspecified: Secondary | ICD-10-CM | POA: Diagnosis present

## 2020-02-03 DIAGNOSIS — Z515 Encounter for palliative care: Secondary | ICD-10-CM | POA: Diagnosis not present

## 2020-02-03 DIAGNOSIS — R519 Headache, unspecified: Secondary | ICD-10-CM | POA: Diagnosis not present

## 2020-02-03 DIAGNOSIS — I951 Orthostatic hypotension: Secondary | ICD-10-CM | POA: Diagnosis not present

## 2020-02-03 DIAGNOSIS — R269 Unspecified abnormalities of gait and mobility: Secondary | ICD-10-CM | POA: Diagnosis not present

## 2020-02-03 DIAGNOSIS — E119 Type 2 diabetes mellitus without complications: Secondary | ICD-10-CM

## 2020-02-03 DIAGNOSIS — R413 Other amnesia: Secondary | ICD-10-CM | POA: Diagnosis present

## 2020-02-03 DIAGNOSIS — R55 Syncope and collapse: Secondary | ICD-10-CM | POA: Diagnosis not present

## 2020-02-03 DIAGNOSIS — Z7982 Long term (current) use of aspirin: Secondary | ICD-10-CM | POA: Diagnosis not present

## 2020-02-03 DIAGNOSIS — M6282 Rhabdomyolysis: Secondary | ICD-10-CM | POA: Diagnosis present

## 2020-02-03 DIAGNOSIS — J9811 Atelectasis: Secondary | ICD-10-CM | POA: Diagnosis not present

## 2020-02-03 DIAGNOSIS — Z888 Allergy status to other drugs, medicaments and biological substances status: Secondary | ICD-10-CM

## 2020-02-03 DIAGNOSIS — I129 Hypertensive chronic kidney disease with stage 1 through stage 4 chronic kidney disease, or unspecified chronic kidney disease: Secondary | ICD-10-CM | POA: Diagnosis present

## 2020-02-03 DIAGNOSIS — Z85038 Personal history of other malignant neoplasm of large intestine: Secondary | ICD-10-CM

## 2020-02-03 DIAGNOSIS — R42 Dizziness and giddiness: Secondary | ICD-10-CM | POA: Diagnosis not present

## 2020-02-03 DIAGNOSIS — I517 Cardiomegaly: Secondary | ICD-10-CM | POA: Diagnosis not present

## 2020-02-03 DIAGNOSIS — I639 Cerebral infarction, unspecified: Secondary | ICD-10-CM | POA: Diagnosis present

## 2020-02-03 LAB — URINALYSIS, ROUTINE W REFLEX MICROSCOPIC
Bilirubin Urine: NEGATIVE
Glucose, UA: >=500 mg/dL — AB
Ketones, ur: NEGATIVE mg/dL
Leukocytes,Ua: NEGATIVE
Nitrite: NEGATIVE
Protein, ur: NEGATIVE mg/dL
RBC / HPF: >50 RBC/hpf — ABNORMAL HIGH (ref 0–5)
Specific Gravity, Urine: 1.006 (ref 1.005–1.030)
pH: 7 (ref 5.0–8.0)

## 2020-02-03 LAB — BASIC METABOLIC PANEL
Anion gap: 11 (ref 5–15)
BUN: 20 mg/dL (ref 8–23)
CO2: 22 mmol/L (ref 22–32)
Calcium: 9.4 mg/dL (ref 8.9–10.3)
Chloride: 107 mmol/L (ref 98–111)
Creatinine, Ser: 1.65 mg/dL — ABNORMAL HIGH (ref 0.61–1.24)
GFR calc Af Amer: 45 mL/min — ABNORMAL LOW (ref >60)
GFR calc non Af Amer: 39 mL/min — ABNORMAL LOW (ref >60)
Glucose, Bld: 228 mg/dL — ABNORMAL HIGH (ref 70–99)
Potassium: 4 mmol/L (ref 3.5–5.1)
Sodium: 140 mmol/L (ref 135–145)

## 2020-02-03 LAB — CBC
HCT: 40.3 % (ref 39.0–52.0)
Hemoglobin: 13.1 g/dL (ref 13.0–17.0)
MCH: 26.4 pg (ref 26.0–34.0)
MCHC: 32.5 g/dL (ref 30.0–36.0)
MCV: 81.3 fL (ref 80.0–100.0)
Platelets: 148 10*3/uL — ABNORMAL LOW (ref 150–400)
RBC: 4.96 MIL/uL (ref 4.22–5.81)
RDW: 14.6 % (ref 11.5–15.5)
WBC: 10.1 10*3/uL (ref 4.0–10.5)
nRBC: 0 % (ref 0.0–0.2)

## 2020-02-03 LAB — CBG MONITORING, ED: Glucose-Capillary: 211 mg/dL — ABNORMAL HIGH (ref 70–99)

## 2020-02-03 MED ORDER — SODIUM CHLORIDE 0.9 % IV BOLUS
1000.0000 mL | Freq: Once | INTRAVENOUS | Status: AC
  Administered 2020-02-03: 1000 mL via INTRAVENOUS

## 2020-02-03 MED ORDER — SODIUM CHLORIDE 0.9% FLUSH: 3.0000 mL | Freq: Once | INTRAVENOUS | Status: DC

## 2020-02-03 NOTE — ED Triage Notes (Addendum)
Pt in via [facility] after 2 syncopal events. EMS states he was outside, had been working on yard, sat in a chair and passed out. Had another sitting syncopal event when EMS arrived. SB en route, 50-60's. BP initially 110/60, given 576ml's NS. Denies any cp at time of events. Has some dizziness when sitting. CBG 211

## 2020-02-03 NOTE — ED Notes (Signed)
Attempted to walk pt.  Pt very unsteady on his feet.  Unable to ambulate without holding on to someone.  Dr. Stark Jock made aware

## 2020-02-03 NOTE — ED Notes (Signed)
Pt to MRI at this time.

## 2020-02-03 NOTE — ED Provider Notes (Signed)
[facility] EMERGENCY DEPARTMENT Provider Note   CSN: [id number] Arrival date & time: 02/03/20  1422     History Chief Complaint  Patient presents with  . Loss of Consciousness    Ronald Dyer is a 79 y.o. male.  Patient is an 79 year old male with extensive past medical history including prior CVA, COPD, chronic renal insufficiency, diabetes, and hypertension.  He presents today by EMS after a syncopal episode.  Patient states that he was working in his yard when he stopped to chat with his neighbor.  He began to feel weak, then passed out.  He was unconscious for several minutes, then his neighbor was able to wake him.  He was able to sit in a chair, then experienced a second similar episode that did not last quite as long.  Patient denies to me he felt any chest pain, shortness of breath or palpitations.  There was no reported seizure activity, nor was there loss of bowel or bladder function or oral trauma.  Patient states he felt fine prior to this episode and has had no recent illness or issues.  The history is provided by the patient.  Loss of Consciousness Episode history:  Multiple Most recent episode:  Today Timing:  Constant Progression:  Resolved Chronicity:  New Witnessed: yes   Relieved by:  Nothing Worsened by:  Nothing Ineffective treatments:  None tried Associated symptoms: no chest pain, no headaches and no shortness of breath        Past Medical History:  Diagnosis Date  . Atherosclerosis of aorta ([place])   . CKD (chronic kidney disease), stage III   . COPD (chronic obstructive pulmonary disease) ([place])   . Depression   . Diabetes mellitus without complication ([place])   . Diabetic retinopathy ([place])   . Frequent falls   . GERD (gastroesophageal reflux disease)   . History of rhabdomyolysis   . Hypercholesterolemia   . Hypertension   . Memory loss   . 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, Dr Alen Blew  d/c 06/2014   . Stroke [facility])     Patient Active Problem List   Diagnosis Date Noted  . Confusion and disorientation 10/02/2019  . 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 ([place]) 04/24/2019  . Aphasia as late effect of cerebrovascular accident 04/24/2019  . Gait disturbance, post-stroke 04/24/2019  . 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 CVA (cerebral vascular accident) ([place]) 06/22/2017  . Colon cancer ([place]) 03/25/2014  . DIABETES MELLITUS II, UNCOMPLICATED 56/21/3086  . DEPRESSION, MAJOR, RECURRENT 10/18/2006  . ALCOHOL ABUSE, UNSPECIFIED 10/18/2006  . COPD 10/18/2006  . GASTROESOPHAGEAL REFLUX, NO ESOPHAGITIS 10/18/2006    Past Surgical History:  Procedure Laterality Date  . cataracts Bilateral 2011  . HEMICOLECTOMY Right 03/05/2009  . UMBILICAL HERNIA REPAIR  03/05/2009       Family History  Problem Relation Age of Onset  . Cancer Father     Social History   Tobacco Use  . Smoking status: Former Smoker    Years: 40.00    Quit date: 09/08/1998    Years since quitting: 21.4  . Smokeless tobacco: Former Network engineer  . Vaping Use: Never used  Substance Use Topics  . Alcohol use: No  . Drug use: No    Home Medications Prior to Admission medications   Medication Sig Start Date End Date Taking? Authorizing Provider  aspirin  81 MG tablet Take 81 mg by mouth daily.    [provider]  atorvastatin (LIPITOR) 10 MG tablet Take 10 mg by mouth 2 (two) times a week.    [provider]  donepezil (ARICEPT) 10 MG tablet TAKE 1 TABLET BY MOUTH EVERYDAY AT BEDTIME 09/25/19   Dohmeier, Asencion Partridge, MD  gabapentin (NEURONTIN) 100 MG capsule  09/09/19   [provider]  glimepiride (AMARYL) 4 MG tablet 4 mg Daily. 10/16/11   [provider]  hydrochlorothiazide (HYDRODIURIL) 25 MG tablet Take 25 mg by mouth daily.    [provider]  lidocaine  (LIDODERM) 5 % Place 1 patch onto the skin daily. Remove & Discard patch within 12 hours or as directed by MD 01/11/20   Margarita Mail, PA-C  lisinopril (ZESTRIL) 40 MG tablet Take 80 mg by mouth daily. 02/02/19   [provider]  Multiple Vitamins-Minerals (MULTIVITAMIN WITH MINERALS) tablet Take 1 tablet by mouth daily.    [provider]  pioglitazone (ACTOS) 30 MG tablet  09/13/19   [provider]  sertraline (ZOLOFT) 100 MG tablet Take 100 mg by mouth daily.    [provider]  sitaGLIPtin (JANUVIA) 100 MG tablet Take 100 mg by mouth daily.    [provider]  tamsulosin (FLOMAX) 0.4 MG CAPS capsule Take 0.4 mg by mouth daily. Take for 1 month for enlargement of the prostate    [provider]  vitamin B-12 (CYANOCOBALAMIN) 100 MCG tablet Take 50 mcg by mouth daily.    [provider]    Allergies    Simvastatin  Review of Systems   Review of Systems  Respiratory: Negative for shortness of breath.   Cardiovascular: Positive for syncope. Negative for chest pain.  Neurological: Negative for headaches.  All other systems reviewed and are negative.   Physical Exam Updated Vital Signs BP 128/66   Temp 97.9 F (36.6 C) (Oral)   Resp (!) 21   Wt 92.1 kg   SpO2 97%   BMI 27.54 kg/m   Physical Exam Vitals and nursing note reviewed.  Constitutional:      General: He is not in acute distress.    Appearance: He is well-developed. He is not diaphoretic.  HENT:     Head: Normocephalic and atraumatic.     Mouth/Throat:     Mouth: Mucous membranes are moist.  Eyes:     Extraocular Movements: Extraocular movements intact.     Pupils: Pupils are equal, round, and reactive to light.  Cardiovascular:     Rate and Rhythm: Normal rate and regular rhythm.     Heart sounds: No murmur heard.  No friction rub.  Pulmonary:     Effort: Pulmonary effort is normal. No respiratory distress.     Breath sounds: Normal breath sounds.  No wheezing or rales.  Abdominal:     General: Bowel sounds are normal. There is no distension.     Palpations: Abdomen is soft.     Tenderness: There is no abdominal tenderness.  Musculoskeletal:        General: Normal range of motion.     Cervical back: Normal range of motion and neck supple.  Skin:    General: Skin is warm and dry.  Neurological:     General: No focal deficit present.     Mental Status: He is alert and oriented to person, place, and time.     Cranial Nerves: No cranial nerve deficit.     Motor:  No weakness.     Coordination: Coordination normal.     ED Results / Procedures / Treatments   Labs (all labs ordered are listed, but only abnormal results are displayed) Labs Reviewed  CBC - Abnormal; Notable for the following components:      Result Value   Platelets 148 (*)    All other components within normal limits  CBG MONITORING, ED - Abnormal; Notable for the following components:   Glucose-Capillary 211 (*)    All other components within normal limits  BASIC METABOLIC PANEL  URINALYSIS, ROUTINE W REFLEX MICROSCOPIC    EKG EKG Interpretation  Date/Time:  Tuesday February 03 2020 14:31:22 EDT Ventricular Rate:  66 PR Interval:    QRS Duration: 94 QT Interval:  427 QTC Calculation: 448 R Axis:   -18 Text Interpretation: Sinus rhythm Consider left atrial enlargement Left ventricular hypertrophy No significant change since 01/11/2020 Confirmed by Veryl Speak [phone]) on 02/03/2020 3:27:27 PM   Radiology No results found.  Procedures Procedures (including critical care time)  Medications Ordered in ED Medications  sodium chloride flush (NS) 0.9 % injection 3 mL (has no administration in time range)  sodium chloride 0.9 % bolus 1,000 mL (has no administration in time range)    ED Course  I have reviewed the triage vital signs and the nursing notes.  Pertinent labs & imaging results that were available during my care of the patient were reviewed by  me and considered in my medical decision making (see chart for details).    MDM Rules/Calculators/A&P  Patient presenting here after experiencing 2 episodes of syncope at home.  I am uncertain as to the etiology, but may be heat related.  He was working in the yard today and when the symptoms began.  His work-up shows no acute abnormality in his laboratory studies and his EKG is unchanged.  He has been observed for several hours and hydrated with normal saline.  The plan was to allow him to go home, however when he stood he became severely dizzy and unsteady on his feet.  For this reason, an MRI of his brain was obtained showing no evidence of stroke.  Due to the patient's persistent instability with ambulation and recurrent syncope, the hospitalist will be consulted for admission/observation.  Final Clinical Impression(s) / ED Diagnoses Final diagnoses:  None    Rx / DC Orders ED Discharge Orders    None       Veryl Speak, MD 02/05/20 [phone]

## 2020-02-03 NOTE — ED Notes (Signed)
Pt returned from MRI °

## 2020-02-04 ENCOUNTER — Encounter (HOSPITAL_COMMUNITY): Payer: Self-pay | Admitting: Internal Medicine

## 2020-02-04 ENCOUNTER — Observation Stay (HOSPITAL_BASED_OUTPATIENT_CLINIC_OR_DEPARTMENT_OTHER): Payer: Medicare HMO

## 2020-02-04 ENCOUNTER — Observation Stay (HOSPITAL_COMMUNITY): Payer: Medicare HMO

## 2020-02-04 DIAGNOSIS — J439 Emphysema, unspecified: Secondary | ICD-10-CM | POA: Diagnosis not present

## 2020-02-04 DIAGNOSIS — R55 Syncope and collapse: Secondary | ICD-10-CM

## 2020-02-04 DIAGNOSIS — I361 Nonrheumatic tricuspid (valve) insufficiency: Secondary | ICD-10-CM

## 2020-02-04 DIAGNOSIS — J9811 Atelectasis: Secondary | ICD-10-CM | POA: Diagnosis not present

## 2020-02-04 DIAGNOSIS — I517 Cardiomegaly: Secondary | ICD-10-CM | POA: Diagnosis not present

## 2020-02-04 DIAGNOSIS — N400 Enlarged prostate without lower urinary tract symptoms: Secondary | ICD-10-CM

## 2020-02-04 LAB — GLUCOSE, CAPILLARY
Glucose-Capillary: 160 mg/dL — ABNORMAL HIGH (ref 70–99)
Glucose-Capillary: 98 mg/dL (ref 70–99)

## 2020-02-04 LAB — CBG MONITORING, ED
Glucose-Capillary: 152 mg/dL — ABNORMAL HIGH (ref 70–99)
Glucose-Capillary: 129 mg/dL — ABNORMAL HIGH (ref 70–99)
Glucose-Capillary: 180 mg/dL — ABNORMAL HIGH (ref 70–99)

## 2020-02-04 LAB — BASIC METABOLIC PANEL
Anion gap: 7 (ref 5–15)
BUN: 17 mg/dL (ref 8–23)
CO2: 23 mmol/L (ref 22–32)
Calcium: 9 mg/dL (ref 8.9–10.3)
Chloride: 108 mmol/L (ref 98–111)
Creatinine, Ser: 1.54 mg/dL — ABNORMAL HIGH (ref 0.61–1.24)
GFR calc Af Amer: 49 mL/min — ABNORMAL LOW (ref >60)
GFR calc non Af Amer: 42 mL/min — ABNORMAL LOW (ref >60)
Glucose, Bld: 200 mg/dL — ABNORMAL HIGH (ref 70–99)
Potassium: 3.8 mmol/L (ref 3.5–5.1)
Sodium: 138 mmol/L (ref 135–145)

## 2020-02-04 LAB — TSH: TSH: 1.505 u[IU]/mL (ref 0.350–4.500)

## 2020-02-04 LAB — ECHOCARDIOGRAM COMPLETE: Weight: 3248.7 oz

## 2020-02-04 LAB — D-DIMER, QUANTITATIVE: D-Dimer, Quant: 4.92 ug/mL-FEU — ABNORMAL HIGH (ref 0.00–0.50)

## 2020-02-04 MED ORDER — SODIUM CHLORIDE 0.9 % IV SOLN: INTRAVENOUS | Status: DC

## 2020-02-04 MED ORDER — ACETAMINOPHEN 650 MG RE SUPP: 650.0000 mg | Freq: Four times a day (QID) | RECTAL | Status: DC | PRN

## 2020-02-04 MED ORDER — GABAPENTIN 100 MG PO CAPS
100.0000 mg | ORAL_CAPSULE | Freq: Every day | ORAL | Status: DC
  Administered 2020-02-05 – 2020-02-06 (×2): 100 mg via ORAL
  Filled 2020-02-04 (×3): qty 1

## 2020-02-04 MED ORDER — DONEPEZIL HCL 10 MG PO TABS
10.0000 mg | ORAL_TABLET | Freq: Every day | ORAL | Status: DC
  Administered 2020-02-04 – 2020-02-05 (×2): 10 mg via ORAL
  Filled 2020-02-04 (×3): qty 1

## 2020-02-04 MED ORDER — INSULIN ASPART 100 UNIT/ML ~~LOC~~ SOLN
0.0000 [IU] | Freq: Three times a day (TID) | SUBCUTANEOUS | Status: DC
  Administered 2020-02-04: 2 [IU] via SUBCUTANEOUS
  Administered 2020-02-04 – 2020-02-05 (×2): 1 [IU] via SUBCUTANEOUS
  Administered 2020-02-05: 2 [IU] via SUBCUTANEOUS
  Administered 2020-02-06 (×2): 1 [IU] via SUBCUTANEOUS

## 2020-02-04 MED ORDER — ACETAMINOPHEN 325 MG PO TABS
650.0000 mg | ORAL_TABLET | Freq: Four times a day (QID) | ORAL | Status: DC | PRN
  Administered 2020-02-05: 650 mg via ORAL
  Filled 2020-02-04: qty 2

## 2020-02-04 MED ORDER — IOHEXOL 350 MG/ML SOLN
100.0000 mL | Freq: Once | INTRAVENOUS | Status: AC | PRN
  Administered 2020-02-04: 75 mL via INTRAVENOUS

## 2020-02-04 MED ORDER — ADULT MULTIVITAMIN W/MINERALS CH
1.0000 | ORAL_TABLET | Freq: Every day | ORAL | Status: DC
  Administered 2020-02-05 – 2020-02-06 (×2): 1 via ORAL
  Filled 2020-02-04 (×3): qty 1

## 2020-02-04 MED ORDER — INSULIN ASPART 100 UNIT/ML ~~LOC~~ SOLN: 0.0000 [IU] | Freq: Every day | SUBCUTANEOUS | Status: DC

## 2020-02-04 MED ORDER — VITAMIN B-12 100 MCG PO TABS
50.0000 ug | ORAL_TABLET | Freq: Every day | ORAL | Status: DC
  Administered 2020-02-05 – 2020-02-06 (×2): 50 ug via ORAL
  Filled 2020-02-04 (×3): qty 1

## 2020-02-04 MED ORDER — TAMSULOSIN HCL 0.4 MG PO CAPS
0.4000 mg | ORAL_CAPSULE | Freq: Every day | ORAL | Status: DC
  Administered 2020-02-05 – 2020-02-06 (×2): 0.4 mg via ORAL
  Filled 2020-02-04 (×3): qty 1

## 2020-02-04 MED ORDER — ASPIRIN 81 MG PO CHEW
81.0000 mg | CHEWABLE_TABLET | Freq: Every day | ORAL | Status: DC
  Administered 2020-02-05 – 2020-02-06 (×2): 81 mg via ORAL
  Filled 2020-02-04 (×3): qty 1

## 2020-02-04 MED ORDER — ATORVASTATIN CALCIUM 10 MG PO TABS
10.0000 mg | ORAL_TABLET | ORAL | Status: DC
  Administered 2020-02-05: 10 mg via ORAL
  Filled 2020-02-04: qty 1

## 2020-02-04 MED ORDER — SERTRALINE HCL 100 MG PO TABS
100.0000 mg | ORAL_TABLET | Freq: Every day | ORAL | Status: DC
  Administered 2020-02-05 – 2020-02-06 (×2): 100 mg via ORAL
  Filled 2020-02-04 (×3): qty 1

## 2020-02-04 MED ORDER — OXYBUTYNIN CHLORIDE 5 MG PO TABS: 5.0000 mg | ORAL_TABLET | Freq: Three times a day (TID) | ORAL | Status: DC

## 2020-02-04 MED ORDER — ENOXAPARIN SODIUM 40 MG/0.4ML ~~LOC~~ SOLN
40.0000 mg | SUBCUTANEOUS | Status: DC
  Administered 2020-02-04 – 2020-02-06 (×3): 40 mg via SUBCUTANEOUS
  Filled 2020-02-04 (×3): qty 0.4

## 2020-02-04 MED ORDER — SODIUM CHLORIDE 0.9% FLUSH
3.0000 mL | Freq: Two times a day (BID) | INTRAVENOUS | Status: DC
  Administered 2020-02-04 – 2020-02-06 (×5): 3 mL via INTRAVENOUS

## 2020-02-04 NOTE — ED Provider Notes (Signed)
I assumed care in signout to call report.  Patient is an elderly male who had syncopal episodes at home.  Patient had difficulty ambulating in the ER.  MRI brain negative.  Plan to admit.  Discussed with Dr. Marlowe Sax for admission   Ripley Fraise, MD 02/04/20 [phone]

## 2020-02-04 NOTE — ED Notes (Signed)
During orthostatic vitals pt felt very dizzy while standing, pt unable to stand the whole time while Bp reading due to severe dizziness. Pt had to sit down.

## 2020-02-04 NOTE — Evaluation (Signed)
Physical Therapy Evaluation Patient Details Name: Ronald Dyer MRN: [id number] DOB: 1941-05-30 Today's Date: 02/04/2020   History of Present Illness  Pt is an 79 y/o male admitted secondary to syncopal episodes. workup pending. PMH includes DM, CVA, COPD, and HTN.   Clinical Impression  Pt admitted secondary to problem above with deficits below. Pt with increased dizziness in standing and pt reporting his vision was going "blurry" so further mobility deferred. Pt with + orthostatics with SBP when standing (sitting: 156/72; standing: 118/97 mmHg). Pt also with slower speech and poor cognition following mobility. Notified RN and RN in room to assess. Feel pt will benefit from SNF level therapies given current mobility deficits. However, pt may progress well and be able to d/c home with HHPT. Will continue to follow acutely to maximize functional mobility independence and safety.     Follow Up Recommendations SNF;Supervision/Assistance - 24 hour    Equipment Recommendations  Other (comment) (TBD)    Recommendations for Other Services       Precautions / Restrictions Precautions Precautions: Fall Precaution Comments: Watch BP  Restrictions Weight Bearing Restrictions: No      Mobility  Bed Mobility Overal bed mobility: Needs Assistance Bed Mobility: Supine to Sit;Sit to Supine     Supine to sit: Mod assist Sit to supine: Supervision   General bed mobility comments: Mod A for assist with trunk and LEs to come to sitting. Increased time required.   Transfers Overall transfer level: Needs assistance Equipment used: Rolling walker (2 wheeled) Transfers: Sit to/from Stand Sit to Stand: Mod assist         General transfer comment: Mod A for lift assist and steadying. Pt with increased sway and with slower speech. Pt with + orthostatic vitals in standing (sitting 156/72 mmHg; standing 118/97 mmHg). Further mobility deferred.   Ambulation/Gait                 Stairs            Wheelchair Mobility    Modified Rankin (Stroke Patients Only)       Balance Overall balance assessment: Needs assistance Sitting-balance support: No upper extremity supported;Feet supported Sitting balance-Leahy Scale: Fair     Standing balance support: Bilateral upper extremity supported;During functional activity Standing balance-Leahy Scale: Poor Standing balance comment: Reliant on BUE support                              Pertinent Vitals/Pain Pain Assessment: No/denies pain    Home Living Family/patient expects to be discharged to:: Private residence Living Arrangements: Spouse/significant other Available Help at Discharge: Family;Available 24 hours/day Type of Home: House Home Access: Level entry     Home Layout: One level Home Equipment: Cane - single point;Walker - 2 wheels      Prior Function Level of Independence: Independent with assistive device(s)         Comments: USed the cane for ambulation      Hand Dominance        Extremity/Trunk Assessment   Upper Extremity Assessment Upper Extremity Assessment: Defer to OT evaluation    Lower Extremity Assessment Lower Extremity Assessment: Generalized weakness    Cervical / Trunk Assessment Cervical / Trunk Assessment: Kyphotic  Communication   Communication: No difficulties  Cognition Arousal/Alertness: Awake/alert Behavior During Therapy: Flat affect Overall Cognitive Status: No family/caregiver present to determine baseline cognitive functioning  General Comments: Pt with slow speech, especially following standing after + orthostatics. RN notified. Pt asking if someone else was in the room as well.       General Comments General comments (skin integrity, edema, etc.): RN present in room to assess pt.     Exercises     Assessment/Plan    PT Assessment Patient needs continued PT services  PT Problem  List Decreased strength;Decreased activity tolerance;Decreased balance;Decreased mobility;Decreased cognition;Decreased knowledge of use of DME;Decreased safety awareness;Decreased knowledge of precautions;Cardiopulmonary status limiting activity       PT Treatment Interventions Gait training;DME instruction;Functional mobility training;Therapeutic activities;Therapeutic exercise;Balance training;Patient/family education;Cognitive remediation    PT Goals (Current goals can be found in the Care Plan section)  Acute Rehab PT Goals Patient Stated Goal: none stated PT Goal Formulation: With patient Time For Goal Achievement: 02/18/20 Potential to Achieve Goals: Good    Frequency Min 2X/week   Barriers to discharge        Co-evaluation               AM-PAC PT "6 Clicks" Mobility  Outcome Measure Help needed turning from your back to your side while in a flat bed without using bedrails?: A Little Help needed moving from lying on your back to sitting on the side of a flat bed without using bedrails?: A Lot Help needed moving to and from a bed to a chair (including a wheelchair)?: A Lot Help needed standing up from a chair using your arms (e.g., wheelchair or bedside chair)?: A Lot Help needed to walk in hospital room?: A Lot Help needed climbing 3-5 steps with a railing? : Total 6 Click Score: 12    End of Session Equipment Utilized During Treatment: Gait belt Activity Tolerance: Treatment limited secondary to medical complications (Comment) (+ orthostatics) Patient left: in bed;with call bell/phone within reach;with bed alarm set;with nursing/sitter in room Nurse Communication: Mobility status PT Visit Diagnosis: Unsteadiness on feet (R26.81);Muscle weakness (generalized) (M62.81);Difficulty in walking, not elsewhere classified (R26.2)    Time: 9643-8381 PT Time Calculation (min) (ACUTE ONLY): 17 min   Charges:   PT Evaluation $PT Eval Moderate Complexity: 1 Mod           Reuel Derby, PT, DPT  Acute Rehabilitation Services  Pager: [phone] Office: [phone]   Rudean Hitt 02/04/2020, 5:59 PM

## 2020-02-04 NOTE — ED Notes (Signed)
Tele   Breakfast ordered  

## 2020-02-04 NOTE — ED Notes (Signed)
Pt given breakfast tray

## 2020-02-04 NOTE — H&P (Addendum)
History and Physical    Ronald Dyer [place] YIR:[id number] DOB: 07-20-1941 DOA: 02/03/2020  PCP: [name], MD Patient coming from: Home  Chief Complaint: Syncope  HPI: Ronald Dyer is a 79 y.o. male with medical history significant of aortic atherosclerosis, COPD depression, type II diabetes, frequent falls, GERD, hypertension, hyperlipidemia, memory loss, history of colon cancer, CVA presenting to the ED via EMS after having 2 syncopal events at home.  History provided by patient and wife at bedside.  He was in his usual state of health and went out on a walk around the house with his neighbor.  While talking to the neighbor he all of a sudden blacked out.  Denies preceding lightheadedness/dizziness, chest pain, palpitations, or shortness of breath.  After he regained consciousness they sat him down on a chair and while waiting for EMS had another episode of syncope.  Denies history of seizures.  Denies prior syncopal episodes.  His appetite has been good.  He has not had any recent fevers, vomiting, or diarrhea.  ED Course: Afebrile.  Pulse 78 and blood pressure 128/66 on arrival to the ED.  Labs showing no leukocytosis.  Hemoglobin normal. Blood glucose 228.  BUN 20, creatinine 1.6.  Creatinine was ranging between 1.8 on 01/11/2020.  Head CT and brain MRI negative for acute intracranial abnormality.  EKG without acute ischemic changes or evidence of arrhythmia.  Patient was given 1 L normal saline bolus.  Review of Systems:  All systems reviewed and apart from history of presenting illness, are negative.  Past Medical History:  Diagnosis Date  . Atherosclerosis of aorta ([place])   . CKD (chronic kidney disease), stage III   . COPD (chronic obstructive pulmonary disease) ([place])   . Depression   . Diabetes mellitus without complication ([place])   . Diabetic retinopathy ([place])   . Frequent falls   . GERD (gastroesophageal reflux disease)   . History of rhabdomyolysis   .  Hypercholesterolemia   . Hypertension   . Memory loss   . 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 Stroke [facility])     Past Surgical History:  Procedure Laterality Date  . cataracts Bilateral 2011  . HEMICOLECTOMY Right 03/05/2009  . UMBILICAL HERNIA REPAIR  03/05/2009     reports that he quit smoking about 21 years ago. He quit after 40.00 years of use. He has quit using smokeless tobacco. He reports that he does not drink alcohol and does not use drugs.  Allergies  Allergen Reactions  . Simvastatin     High CPK     Family History  Problem Relation Age of Onset  . Cancer Father     Prior to Admission medications   Medication Sig Start Date End Date Taking? Authorizing Provider  aspirin 81 MG tablet Take 81 mg by mouth daily.   Yes [provider]  atorvastatin (LIPITOR) 10 MG tablet Take 10 mg by mouth 2 (two) times a week. Tuesdays & Thursdays.   Yes [provider]  donepezil (ARICEPT) 10 MG tablet TAKE 1 TABLET BY MOUTH EVERYDAY AT BEDTIME 09/25/19  Yes Dohmeier, Asencion Partridge, MD  gabapentin (NEURONTIN) 100 MG capsule Take 100 mg by mouth daily.  09/09/19  Yes [provider]  glimepiride (AMARYL) 4 MG tablet Take 8 mg by mouth daily.  10/16/11  Yes [provider]  hydrochlorothiazide (HYDRODIURIL) 25 MG tablet Take 25 mg by mouth daily.  Yes [provider]  lisinopril (ZESTRIL) 40 MG tablet Take 80 mg by mouth daily.  02/02/19  Yes [provider]  Multiple Vitamins-Minerals (MULTIVITAMIN WITH MINERALS) tablet Take 1 tablet by mouth daily.   Yes [provider]  pioglitazone (ACTOS) 15 MG tablet Take 15 mg by mouth daily.   Yes [provider]  sertraline (ZOLOFT) 100 MG tablet Take 100 mg by mouth daily.   Yes [provider]  tamsulosin (FLOMAX) 0.4 MG CAPS capsule Take 0.4 mg by mouth daily. Take for 1 month for enlargement of the prostate    Yes [provider]  vitamin B-12 (CYANOCOBALAMIN) 100 MCG tablet Take 50 mcg by mouth daily.   Yes [provider]  lidocaine (LIDODERM) 5 % Place 1 patch onto the skin daily. Remove & Discard patch within 12 hours or as directed by MD Patient not taking: Reported on 02/03/2020 01/11/20   Margarita Mail, PA-C    Physical Exam: Vitals:   02/03/20 1436 02/03/20 2123 02/03/20 2200 02/03/20 2330  BP:  (!) 156/71 138/61 132/63  Pulse:  (!) 130 (!) 47 (!) 48  Resp:    17  Temp: 97.9 F (36.6 C)     TempSrc: Oral     SpO2:  92% 95% 97%  Weight: 92.1 kg       Physical Exam Constitutional:      General: He is not in acute distress.    Appearance: He is not diaphoretic.  HENT:     Head: Normocephalic.     Mouth/Throat:     Mouth: Mucous membranes are moist.  Eyes:     Extraocular Movements: Extraocular movements intact.  Cardiovascular:     Rate and Rhythm: Normal rate and regular rhythm.     Pulses: Normal pulses.  Pulmonary:     Effort: Pulmonary effort is normal. No respiratory distress.     Breath sounds: Normal breath sounds. No wheezing or rales.  Abdominal:     General: Bowel sounds are normal. There is no distension.     Palpations: Abdomen is soft.     Tenderness: There is no abdominal tenderness. There is no guarding.  Musculoskeletal:        General: No swelling.     Cervical back: Normal range of motion and neck supple.  Skin:    General: Skin is warm and dry.  Neurological:     Mental Status: He is alert and oriented to person, place, and time.     Labs on Admission: I have personally reviewed following labs and imaging studies  CBC: Recent Labs  Lab 02/03/20 1452  WBC 10.1  HGB 13.1  HCT 40.3  MCV 81.3  PLT 790*   Basic Metabolic Panel: Recent Labs  Lab 02/03/20 1452  NA 140  K 4.0  CL 107  CO2 22  GLUCOSE 228*  BUN 20  CREATININE 1.65*  CALCIUM 9.4   GFR: Estimated Creatinine Clearance: 39.2 mL/min (A) (by C-G formula  based on SCr of 1.65 mg/dL (H)). Liver Function Tests: No results for input(s): AST, ALT, ALKPHOS, BILITOT, PROT, ALBUMIN in the last 168 hours. No results for input(s): LIPASE, AMYLASE in the last 168 hours. No results for input(s): AMMONIA in the last 168 hours. Coagulation Profile: No results for input(s): INR, PROTIME in the last 168 hours. Cardiac Enzymes: No results for input(s): CKTOTAL, CKMB, CKMBINDEX, TROPONINI in the last 168 hours. BNP (last 3 results) No results for input(s): PROBNP in the last 8760  hours. HbA1C: No results for input(s): HGBA1C in the last 72 hours. CBG: Recent Labs  Lab 02/03/20 1510  GLUCAP 211*   Lipid Profile: No results for input(s): CHOL, HDL, LDLCALC, TRIG, CHOLHDL, LDLDIRECT in the last 72 hours. Thyroid Function Tests: No results for input(s): TSH, T4TOTAL, FREET4, T3FREE, THYROIDAB in the last 72 hours. Anemia Panel: No results for input(s): VITAMINB12, FOLATE, FERRITIN, TIBC, IRON, RETICCTPCT in the last 72 hours. Urine analysis:    Component Value Date/Time   COLORURINE YELLOW 02/03/2020 [place] 02/03/2020 1452   LABSPEC 1.006 02/03/2020 1452   PHURINE 7.0 02/03/2020 1452   GLUCOSEU >=500 (A) 02/03/2020 1452   HGBUR MODERATE (A) 02/03/2020 1452   BILIRUBINUR NEGATIVE 02/03/2020 1452   KETONESUR NEGATIVE 02/03/2020 1452   PROTEINUR NEGATIVE 02/03/2020 1452   NITRITE NEGATIVE 02/03/2020 1452   LEUKOCYTESUR NEGATIVE 02/03/2020 1452    Radiological Exams on Admission: CT Head Wo Contrast  Result Date: 02/03/2020 CLINICAL DATA:  Syncope, bradycardia EXAM: CT HEAD WITHOUT CONTRAST TECHNIQUE: Contiguous axial images were obtained from the base of the skull through the vertex without intravenous contrast. COMPARISON:  04/19/2017 FINDINGS: Brain: Confluent hypodensities throughout the periventricular white matter are compatible with chronic small vessel ischemic changes, and stable since prior exam. No evidence of acute  infarct or hemorrhage. The lateral ventricles and remaining midline structures are unremarkable. No acute extra-axial fluid collections. No mass effect. Vascular: No hyperdense vessel or unexpected calcification. Skull: Normal. Negative for fracture or focal lesion. Sinuses/Orbits: No acute finding. Other: None. IMPRESSION: 1. Chronic small vessel ischemic changes, no acute intracranial process. Electronically Signed   By: Randa Ngo M.D.   On: 02/03/2020 18:05   MR BRAIN WO CONTRAST  Result Date: 02/03/2020 CLINICAL DATA:  Initial evaluation for acute ataxia, stroke suspected. EXAM: MRI HEAD WITHOUT CONTRAST TECHNIQUE: Multiplanar, multiecho pulse sequences of the brain and surrounding structures were obtained without intravenous contrast. COMPARISON:  Comparison made with prior CT from earlier the same day. FINDINGS: Brain: Generalized age-related cerebral atrophy. Patchy and confluent T2/FLAIR hyperintensity seen within the periventricular and deep white matter both cerebral hemispheres as well as the pons, most consistent with chronic small vessel ischemic disease, moderate in nature. Multiple scattered remote lacunar infarcts noted about the bilateral thalami and pons. No abnormal foci of restricted diffusion to suggest acute or subacute ischemia. Gray-white matter differentiation maintained. No encephalomalacia to suggest chronic cortical infarction. No evidence for acute intracranial hemorrhage. Few scattered punctate chronic micro hemorrhages noted clustered about the deep gray nuclei, most likely related to chronic underlying hypertension. No mass lesion, midline shift or mass effect. No hydrocephalus or extra-axial fluid collection. Pituitary gland suprasellar region within normal limits. Midline structures intact. Vascular: Major intracranial vascular flow voids are maintained. Skull and upper cervical spine: Craniocervical junction within normal limits. Bone marrow signal intensity normal. No  scalp soft tissue abnormality. Sinuses/Orbits: Globes and orbital soft tissues demonstrate no acute finding. Patient status post bilateral ocular lens replacement. Paranasal sinuses are largely clear. No mastoid effusion. Inner ear structures grossly normal. Other: None. IMPRESSION: 1. No acute intracranial abnormality. 2. Age-related cerebral atrophy with moderate chronic microvascular ischemic disease, with multiple remote lacunar infarcts about the bilateral thalami and pons. Electronically Signed   By: Jeannine Boga M.D.   On: 02/03/2020 23:50    EKG: Independently reviewed.  Sinus rhythm, no acute changes.  Assessment/Plan Principal Problem:   Syncope Active Problems:   Type 2 diabetes mellitus ([place])   Depression  CVA (cerebral vascular accident) ([place])   BPH (benign prostatic hyperplasia)   Syncope: Patient had 2 episodes of syncope at home.  Given no prodrome there is concern for possible cardiogenic cause of syncope.  Sinus bradycardia with heart rate ranging from 40s to 50s on the monitor.  Not hypotensive.  Not hypoglycemic.  Head CT and brain MRI negative for acute intracranial abnormality.  EKG without acute ischemic changes or evidence of arrhythmia.  PE less likely given no tachycardia or hypoxia. -Cardiac monitoring, orthostatics, echocardiogram, carotid Dopplers, EEG, D-dimer, and chest x-ray.  Addendum: D-dimer elevated at 4.92.  GFR 45 and did receive a fluid bolus earlier.  Order CT angiogram to rule out PE.  Sinus bradycardia: Heart rate ranging from the 40s to 50s.  Not hypotensive.  Not on any AV nodal blocking agent. -Cardiac monitoring, check TSH level  Non-insulin-dependent type 2 diabetes -Check A1c.  Sliding scale insulin sensitive ACHS and CBG checks.  Hypertension -Hold antihypertensives at this time.  Checking orthostatics.  Hyperlipidemia -Continue home statin  CVA -Continue aspirin and statin  CKD stage III: Creatinine 1.6, slightly improved  compared to labs done last month.  Depression -Continue Zoloft  BPH -Continue Flomax  DVT prophylaxis: Lovenox Code Status: Patient wishes to be full code. Family Communication: Wife at bedside. Disposition Plan: Status is: Observation  The patient remains OBS appropriate and will d/c before 2 midnights.  Dispo: The patient is from: Home              Anticipated d/c is to: Home              Anticipated d/c date is: 1 day              Patient currently is not medically stable to d/c.  The medical decision making on this patient was of high complexity and the patient is at high risk for clinical deterioration, therefore this is a level 3 visit.  Shela Leff MD [facility]  If 7PM-7AM, please contact night-coverage [email or web address]  02/04/2020, 1:46 AM

## 2020-02-04 NOTE — ED Notes (Signed)
Admitting provider at bedside.

## 2020-02-04 NOTE — Progress Notes (Signed)
Tarris Delbene was admitted earlier by Dr Marlowe Sax, please see her note for detailed H&P.   79 year old gentleman with prior h/o COPD, depression, type 2 diabetes, GERD, essential hypertension, hyperlipidemia presents to ED after 2 syncopal episodes.  He was admitted for further evaluation. MRI of the brain does not show any acute intracranial abnormality. EKG shows bradycardia without any ischemic changes or evidence of erythremia. CT angiogram of the chest is negative for pulmonary embolism. Echocardiogram ordered for further evaluation.  Patient was started on IV fluids.  Patient seen and examined bedside with his wife at bedside.  Patient reports persistent dizziness on standing up. Carotid duplex and EEG pending. Orthostatic vitals could not be obtained at this time due to persistent dizziness. PT/OT evaluations are pending.  Hosie Poisson, MD

## 2020-02-04 NOTE — Progress Notes (Signed)
  Echocardiogram 2D Echocardiogram has been performed.  Ronald Dyer 02/04/2020, 2:45 PM

## 2020-02-05 ENCOUNTER — Other Ambulatory Visit: Payer: Self-pay

## 2020-02-05 DIAGNOSIS — Z7984 Long term (current) use of oral hypoglycemic drugs: Secondary | ICD-10-CM | POA: Diagnosis not present

## 2020-02-05 DIAGNOSIS — R001 Bradycardia, unspecified: Secondary | ICD-10-CM | POA: Diagnosis present

## 2020-02-05 DIAGNOSIS — R42 Dizziness and giddiness: Secondary | ICD-10-CM | POA: Diagnosis not present

## 2020-02-05 DIAGNOSIS — E1169 Type 2 diabetes mellitus with other specified complication: Secondary | ICD-10-CM | POA: Diagnosis not present

## 2020-02-05 DIAGNOSIS — J449 Chronic obstructive pulmonary disease, unspecified: Secondary | ICD-10-CM | POA: Diagnosis present

## 2020-02-05 DIAGNOSIS — I700 Atherosclerosis of aorta: Secondary | ICD-10-CM | POA: Diagnosis present

## 2020-02-05 DIAGNOSIS — I951 Orthostatic hypotension: Secondary | ICD-10-CM | POA: Diagnosis present

## 2020-02-05 DIAGNOSIS — N183 Chronic kidney disease, stage 3 unspecified: Secondary | ICD-10-CM | POA: Diagnosis present

## 2020-02-05 DIAGNOSIS — N400 Enlarged prostate without lower urinary tract symptoms: Secondary | ICD-10-CM | POA: Diagnosis present

## 2020-02-05 DIAGNOSIS — I69320 Aphasia following cerebral infarction: Secondary | ICD-10-CM | POA: Diagnosis not present

## 2020-02-05 DIAGNOSIS — Z79899 Other long term (current) drug therapy: Secondary | ICD-10-CM | POA: Diagnosis not present

## 2020-02-05 DIAGNOSIS — I129 Hypertensive chronic kidney disease with stage 1 through stage 4 chronic kidney disease, or unspecified chronic kidney disease: Secondary | ICD-10-CM | POA: Diagnosis present

## 2020-02-05 DIAGNOSIS — Z515 Encounter for palliative care: Secondary | ICD-10-CM | POA: Diagnosis not present

## 2020-02-05 DIAGNOSIS — Z85038 Personal history of other malignant neoplasm of large intestine: Secondary | ICD-10-CM | POA: Diagnosis not present

## 2020-02-05 DIAGNOSIS — K219 Gastro-esophageal reflux disease without esophagitis: Secondary | ICD-10-CM | POA: Diagnosis present

## 2020-02-05 DIAGNOSIS — E785 Hyperlipidemia, unspecified: Secondary | ICD-10-CM | POA: Diagnosis present

## 2020-02-05 DIAGNOSIS — E1122 Type 2 diabetes mellitus with diabetic chronic kidney disease: Secondary | ICD-10-CM | POA: Diagnosis present

## 2020-02-05 DIAGNOSIS — R55 Syncope and collapse: Secondary | ICD-10-CM | POA: Diagnosis present

## 2020-02-05 DIAGNOSIS — D509 Iron deficiency anemia, unspecified: Secondary | ICD-10-CM | POA: Diagnosis present

## 2020-02-05 DIAGNOSIS — I69351 Hemiplegia and hemiparesis following cerebral infarction affecting right dominant side: Secondary | ICD-10-CM | POA: Diagnosis not present

## 2020-02-05 DIAGNOSIS — R296 Repeated falls: Secondary | ICD-10-CM | POA: Diagnosis present

## 2020-02-05 DIAGNOSIS — Z7189 Other specified counseling: Secondary | ICD-10-CM

## 2020-02-05 DIAGNOSIS — E11319 Type 2 diabetes mellitus with unspecified diabetic retinopathy without macular edema: Secondary | ICD-10-CM | POA: Diagnosis present

## 2020-02-05 DIAGNOSIS — Z87891 Personal history of nicotine dependence: Secondary | ICD-10-CM | POA: Diagnosis not present

## 2020-02-05 DIAGNOSIS — M6282 Rhabdomyolysis: Secondary | ICD-10-CM | POA: Diagnosis present

## 2020-02-05 DIAGNOSIS — E1165 Type 2 diabetes mellitus with hyperglycemia: Secondary | ICD-10-CM | POA: Diagnosis present

## 2020-02-05 DIAGNOSIS — F329 Major depressive disorder, single episode, unspecified: Secondary | ICD-10-CM | POA: Diagnosis present

## 2020-02-05 DIAGNOSIS — Z7982 Long term (current) use of aspirin: Secondary | ICD-10-CM | POA: Diagnosis not present

## 2020-02-05 LAB — BASIC METABOLIC PANEL
Anion gap: 8 (ref 5–15)
BUN: 13 mg/dL (ref 8–23)
CO2: 22 mmol/L (ref 22–32)
Calcium: 9.5 mg/dL (ref 8.9–10.3)
Chloride: 107 mmol/L (ref 98–111)
Creatinine, Ser: 1.48 mg/dL — ABNORMAL HIGH (ref 0.61–1.24)
GFR calc Af Amer: 51 mL/min — ABNORMAL LOW (ref >60)
GFR calc non Af Amer: 44 mL/min — ABNORMAL LOW (ref >60)
Glucose, Bld: 151 mg/dL — ABNORMAL HIGH (ref 70–99)
Potassium: 4.2 mmol/L (ref 3.5–5.1)
Sodium: 137 mmol/L (ref 135–145)

## 2020-02-05 LAB — CBC
HCT: 38 % — ABNORMAL LOW (ref 39.0–52.0)
Hemoglobin: 12.4 g/dL — ABNORMAL LOW (ref 13.0–17.0)
MCH: 25.8 pg — ABNORMAL LOW (ref 26.0–34.0)
MCHC: 32.6 g/dL (ref 30.0–36.0)
MCV: 79.2 fL — ABNORMAL LOW (ref 80.0–100.0)
Platelets: 157 10*3/uL (ref 150–400)
RBC: 4.8 MIL/uL (ref 4.22–5.81)
RDW: 14.5 % (ref 11.5–15.5)
WBC: 7.2 10*3/uL (ref 4.0–10.5)
nRBC: 0 % (ref 0.0–0.2)

## 2020-02-05 LAB — HEMOGLOBIN A1C
Hgb A1c MFr Bld: 9.8 % — ABNORMAL HIGH (ref 4.8–5.6)
Mean Plasma Glucose: 235 mg/dL

## 2020-02-05 LAB — TROPONIN I (HIGH SENSITIVITY)
Troponin I (High Sensitivity): 10 ng/L (ref <18)
Troponin I (High Sensitivity): 8 ng/L (ref <18)

## 2020-02-05 LAB — MAGNESIUM: Magnesium: 1.6 mg/dL — ABNORMAL LOW (ref 1.7–2.4)

## 2020-02-05 LAB — GLUCOSE, CAPILLARY
Glucose-Capillary: 122 mg/dL — ABNORMAL HIGH (ref 70–99)
Glucose-Capillary: 154 mg/dL — ABNORMAL HIGH (ref 70–99)
Glucose-Capillary: 199 mg/dL — ABNORMAL HIGH (ref 70–99)
Glucose-Capillary: 145 mg/dL — ABNORMAL HIGH (ref 70–99)

## 2020-02-05 MED ORDER — PANTOPRAZOLE SODIUM 40 MG PO TBEC
40.0000 mg | DELAYED_RELEASE_TABLET | Freq: Every day | ORAL | Status: DC
  Administered 2020-02-05 – 2020-02-06 (×2): 40 mg via ORAL
  Filled 2020-02-05 (×2): qty 1

## 2020-02-05 MED ORDER — ALUM & MAG HYDROXIDE-SIMETH 200-200-20 MG/5ML PO SUSP: 30.0000 mL | Freq: Four times a day (QID) | ORAL | Status: DC | PRN

## 2020-02-05 NOTE — Evaluation (Signed)
Occupational Therapy Evaluation Patient Details Name: Ronald Dyer MRN: [id number] DOB: Aug 09, 1941 Today's Date: 02/05/2020    History of Present Illness Pt is an 79 y/o male admitted secondary to syncopal episodes. workup pending. PMH includes DM, CVA, COPD, and HTN.    Clinical Impression   PTA, pt was living at home with his wife, pt reports his wife was assisting with LB dressing and ADL prn and pt was modified independent at spc level. Pt reports speech deficits, memory deficits, and visual deficits at baseline. Pt reports memory appears to be getting worse, pt forgetting point of thought mid sentence. Pt currently limited by orthostatic BP (see below), RN notified. Due to decline in current level of function, pt would benefit from acute OT to address established goals to facilitate safe D/C to venue listed below. At this time, recommend SNF follow-up. Will continue to follow acutely.  Orthostatic Blood Pressure:  Supine: 170/90 mmHg, HR 54 bpm  Sitting: 160/102 mmHg, HR 53  Standing 1 min: 139/90 mmHg, 66bpm  Return to sitting: 179/145 mmHg, 58bpm  Return to supine: 168/109 mmHg, 53bpm      Follow Up Recommendations  SNF;Supervision/Assistance - 24 hour    Equipment Recommendations  3 in 1 bedside commode    Recommendations for Other Services       Precautions / Restrictions Precautions Precautions: Fall Precaution Comments: Watch BP  Restrictions Weight Bearing Restrictions: No      Mobility Bed Mobility Overal bed mobility: Needs Assistance Bed Mobility: Supine to Sit;Sit to Supine     Supine to sit: Min guard;HOB elevated Sit to supine: Supervision   General bed mobility comments: minguard for safety  Transfers Overall transfer level: Needs assistance Equipment used: Rolling walker (2 wheeled) Transfers: Sit to/from Stand Sit to Stand: Mod assist         General transfer comment: modA to powerup into standing;limited by orthostatic BP     Balance Overall balance assessment: Needs assistance Sitting-balance support: No upper extremity supported;Feet supported Sitting balance-Leahy Scale: Fair     Standing balance support: Bilateral upper extremity supported;During functional activity Standing balance-Leahy Scale: Poor Standing balance comment: reliant on BUE support and physical assistance from therapist                           ADL either performed or assessed with clinical judgement   ADL Overall ADL's : Needs assistance/impaired Eating/Feeding: Set up;Sitting (in supportive seat)   Grooming: Min guard;Sitting   Upper Body Bathing: Min guard;Sitting   Lower Body Bathing: Moderate assistance;Sit to/from stand   Upper Body Dressing : Min guard;Sitting   Lower Body Dressing: Moderate assistance;Sit to/from stand Lower Body Dressing Details (indicate cue type and reason): modA to access BLE and for safety with standing   Toilet Transfer Details (indicate cue type and reason): deferred   Toileting - Clothing Manipulation Details (indicate cue type and reason): deferrred       General ADL Comments: pt limited by orthostatic BP see vitals section;Pt stood from EOB with modA for powerup and stability;vc for safe hand placement     Vision Baseline Vision/History: Cataracts;Wears glasses Wears Glasses: Reading only Patient Visual Report: No change from baseline;Diplopia;Blurring of vision;Peripheral vision impairment Vision Assessment?: Yes Eye Alignment: Within Functional Limits Ocular Range of Motion: Restricted looking up Alignment/Gaze Preference: Within Defined Limits Tracking/Visual Pursuits: Decreased smoothness of eye movement to LEFT superior field;Decreased smoothness of eye movement to LEFT inferior field;Requires cues, head  turns, or add eye shifts to track;Unable to hold eye position out of midline Saccades: Undershoots;Decreased speed of saccadic movement;Additional head turns occurred  during testing;Additional eye shifts occurred during testing Convergence: Impaired (comment) (eyes do not converge together) Visual Fields:  (difficult to assess due to decreased visual attention) Diplopia Assessment: Objects split side to side;Other (comment) (intermittent, infrequent pt reports "comes and goes") Additional Comments: pt with above deficits at baseline     Perception     Praxis      Pertinent Vitals/Pain Pain Assessment: No/denies pain     Hand Dominance Right   Extremity/Trunk Assessment Upper Extremity Assessment Upper Extremity Assessment: Generalized weakness;RUE deficits/detail;LUE deficits/detail RUE Deficits / Details: grossly 4/5 RUE Sensation: WNL RUE Coordination: WNL LUE Deficits / Details: grossly 3+/5 LUE Sensation: WNL LUE Coordination: WNL   Lower Extremity Assessment Lower Extremity Assessment: Generalized weakness;Defer to PT evaluation   Cervical / Trunk Assessment Cervical / Trunk Assessment: Kyphotic   Communication Communication Communication: Expressive difficulties (pt with a stutter at times)   Cognition Arousal/Alertness: Awake/alert Behavior During Therapy: Flat affect Overall Cognitive Status: History of cognitive impairments - at baseline                                 General Comments: pt with slow speech and occassional stuttering;pt reports decreased short term memory, will walk outside and forget reason for going on porch, pt stopped mid conversation and reported he forgot what his point what/what he was going to say;pt is aware of deficits, reports getting very frustrated with memory deficits and expressive communication   General Comments       Exercises     Shoulder Instructions      Home Living Family/patient expects to be discharged to:: Unsure Living Arrangements: Spouse/significant other Available Help at Discharge: Family;Available 24 hours/day Type of Home: House Home Access: Level entry      Home Layout: One level     Bathroom Shower/Tub: Teacher, early years/pre: Standard     Home Equipment: Cane - single point;Walker - 2 wheels          Prior Functioning/Environment Level of Independence: Needs assistance  Gait / Transfers Assistance Needed: pt reports using a cane for mobility ADL's / Homemaking Assistance Needed: pt reports his wife assists with LB dressing.             OT Problem List: Decreased activity tolerance;Impaired balance (sitting and/or standing);Impaired vision/perception;Decreased safety awareness;Decreased cognition;Decreased knowledge of use of DME or AE;Cardiopulmonary status limiting activity      OT Treatment/Interventions: Self-care/ADL training;Therapeutic exercise;Energy conservation;DME and/or AE instruction;Therapeutic activities;Cognitive remediation/compensation;Visual/perceptual remediation/compensation;Patient/family education;Balance training    OT Goals(Current goals can be found in the care plan section) Acute Rehab OT Goals Patient Stated Goal: to get stronger and be able to walk OT Goal Formulation: With patient Time For Goal Achievement: 02/19/20 Potential to Achieve Goals: Good ADL Goals Pt Will Perform Grooming: with modified independence;standing;sitting Pt Will Perform Lower Body Dressing: with supervision;with adaptive equipment;sit to/from stand Pt Will Transfer to Toilet: with min guard assist;stand pivot transfer Additional ADL Goal #1: Pt will demonstrate independence with 3 fall prevention strategies during ADL/IADL and functional mobility. Additional ADL Goal #2: Pt will demonstrate modified independence with 3 compensatory strategies for memory limitations for safe engagement of ADL/IADL.  OT Frequency: Min 2X/week   Barriers to D/C: Decreased caregiver support  pt lives at home with his  wife       Co-evaluation              AM-PAC OT "6 Clicks" Daily Activity     Outcome Measure Help  from another person eating meals?: A Little Help from another person taking care of personal grooming?: A Little Help from another person toileting, which includes using toliet, bedpan, or urinal?: Total Help from another person bathing (including washing, rinsing, drying)?: A Lot Help from another person to put on and taking off regular upper body clothing?: A Little Help from another person to put on and taking off regular lower body clothing?: A Lot 6 Click Score: 14   End of Session Equipment Utilized During Treatment: Gait belt;Rolling walker Nurse Communication: Mobility status  Activity Tolerance: Treatment limited secondary to medical complications (Comment) (orthostatic BP) Patient left: in bed;with call bell/phone within reach;with bed alarm set  OT Visit Diagnosis: Unsteadiness on feet (R26.81);Other abnormalities of gait and mobility (R26.89);Muscle weakness (generalized) (M62.81);History of falling (Z91.81);Low vision, both eyes (H54.2);Other symptoms and signs involving cognitive function                Time: 6812-7517 OT Time Calculation (min): 46 min Charges:  OT General Charges $OT Visit: 1 Visit OT Evaluation $OT Eval Moderate Complexity: 1 Mod OT Treatments $Self Care/Home Management : 23-37 mins  Helene Kelp OTR/L Acute Rehabilitation Services Office: [place] 02/05/2020, 10:12 AM

## 2020-02-05 NOTE — Progress Notes (Signed)
PROGRESS NOTE    Ronald Dyer  OIN:[id number] DOB: 1941/08/02 DOA: 02/03/2020 PCP: [name], MD    Chief Complaint  Patient presents with  . Loss of Consciousness    Brief Narrative: 79 year old gentleman with prior h/o COPD, depression, type 2 diabetes, GERD, essential hypertension, hyperlipidemia presents to ED after 2 syncopal episodes.  He was admitted for further evaluation. MRI of the brain does not show any acute intracranial abnormality. EKG shows bradycardia without any ischemic changes or evidence of arrhythmias.  Telemetry monitoring does not show any arrhythmias. CT angiogram of the chest is negative for pulmonary embolism. Echocardiogram shows Left ventricular ejection fraction, by estimation, is 60 to 65%. The left ventricle has normal function. Left ventricular endocardial border not optimally defined to evaluate regional wall motion. There is mild concentric left ventricular hypertrophy. Left ventricular diastolic parameters are consistent with Grade I diastolic dysfunction (impaired relaxation). Elevated left ventricular end-diastolic pressure.  PT evaluation recommending SNF.  Assessment & Plan:   Principal Problem:   Syncope Active Problems:   Type 2 diabetes mellitus (HCC)   Depression   CVA (cerebral vascular accident) ([place])   BPH (benign prostatic hyperplasia)   Recurrent syncope probably secondary to orthostatic hypotension and dizziness. MRI of the brain ruled out acute stroke and CT angiogram of the chest is negative for PE.  Echocardiogram does not show any major wall motion abnormalities.  Telemetry monitoring overnight shows sinus bradycardia. PT eval , reports positive for orthostatic vital signs. Recommend continuing with IV fluids, TED hoses ordered. TOC on board for SNF placement.   Uncontrolled diabetes mellitus Last hemoglobin A1c is 9.8. CBG (last 3)  Recent Labs    02/04/20 2140 02/05/20 0553 02/05/20 1106  GLUCAP 98 122* 154*     Resume sliding scale insulin at this time.    History of CVA MRI of the brain did not show any acute stroke at this time. Continue with aspirin and statin at this time.    Substernal burning/GERD Continue with Protonix and Maalox.   BPH Continue with Flomax.   Mild microcytic anemia Hemoglobin around 12.  Continue to monitor  In view of higher 42-month mortality rate palliative care consulted for goals of care   DVT prophylaxis: Lovenox Code Status: Full code Family Communication: Wife present at bedside Disposition:   Status is: Observation  The patient will require care spanning > 2 midnights and should be moved to inpatient because: Unsafe d/c plan  Dispo: The patient is from: Home              Anticipated d/c is to: SNF              Anticipated d/c date is: 1 day              Patient currently is not medically stable to d/c.       Consultants:   None  Procedures: Echocardiogram  Antimicrobials: None  Subjective: Patient reports some indigestion and some substernal burning, chest pressure. He denies any shortness of breath.  Dizziness has improved but not completely resolved.  Patient was orthostatic earlier this morning while working with PT. discussed the plan with the wife at bedside.  Objective: Vitals:   02/05/20 0408 02/05/20 0828 02/05/20 1006 02/05/20 1226  BP: (!) 148/104 (!) 141/70  110/71  Pulse: (!) 59 (!) 55  65  Resp: 16 16  16   Temp: 98 F (36.7 C) 98.2 F (36.8 C)  98.8 F (37.1 C)  TempSrc: Oral Oral  Oral  SpO2: 100% 98%  92%  Weight:      Height:   6' (1.829 m)     Intake/Output Summary (Last 24 hours) at 02/05/2020 1229 Last data filed at 02/05/2020 1051 Gross per 24 hour  Intake 1793.73 ml  Output 1050 ml  Net 743.73 ml   Filed Weights   02/03/20 1436  Weight: 92.1 kg    Examination:  General exam: Appears calm and comfortable  Respiratory system: Clear to auscultation. Respiratory effort  normal. Cardiovascular system: S1 & S2 heard, bradycardic no JVD, murmurs,  No pedal edema. Gastrointestinal system: Abdomen is nondistended, soft and nontender. Normal bowel sounds heard. Central nervous system: Alert and oriented. No focal neurological deficits. Extremities: Symmetric 5 x 5 power. Skin: No rashes, lesions or ulcers Psychiatry:  Mood & affect appropriate.     Data Reviewed: I have personally reviewed following labs and imaging studies  CBC: Recent Labs  Lab 02/03/20 1452 02/05/20 1125  WBC 10.1 7.2  HGB 13.1 12.4*  HCT 40.3 38.0*  MCV 81.3 79.2*  PLT 148* 759    Basic Metabolic Panel: Recent Labs  Lab 02/03/20 1452 02/04/20 1121  NA 140 138  K 4.0 3.8  CL 107 108  CO2 22 23  GLUCOSE 228* 200*  BUN 20 17  CREATININE 1.65* 1.54*  CALCIUM 9.4 9.0    GFR: Estimated Creatinine Clearance: 42 mL/min (A) (by C-G formula based on SCr of 1.54 mg/dL (H)).  Liver Function Tests: No results for input(s): AST, ALT, ALKPHOS, BILITOT, PROT, ALBUMIN in the last 168 hours.  CBG: Recent Labs  Lab 02/04/20 1137 02/04/20 1706 02/04/20 2140 02/05/20 0553 02/05/20 1106  GLUCAP 180* 160* 98 122* 154*     No results found for this or any previous visit (from the past 240 hour(s)).       Radiology Studies: X-ray chest PA and lateral  Result Date: 02/04/2020 CLINICAL DATA:  Syncope EXAM: CHEST - 2 VIEW COMPARISON:  12/12/2019 FINDINGS: Cardiomegaly. Bibasilar atelectasis and mild vascular congestion. No overt edema. No effusions or acute bony abnormality. Low lung volumes. IMPRESSION: Cardiomegaly with vascular congestion. Low lung volumes with bibasilar atelectasis. Electronically Signed   By: Rolm Baptise M.D.   On: 02/04/2020 02:19   CT Head Wo Contrast  Result Date: 02/03/2020 CLINICAL DATA:  Syncope, bradycardia EXAM: CT HEAD WITHOUT CONTRAST TECHNIQUE: Contiguous axial images were obtained from the base of the skull through the vertex without  intravenous contrast. COMPARISON:  04/19/2017 FINDINGS: Brain: Confluent hypodensities throughout the periventricular white matter are compatible with chronic small vessel ischemic changes, and stable since prior exam. No evidence of acute infarct or hemorrhage. The lateral ventricles and remaining midline structures are unremarkable. No acute extra-axial fluid collections. No mass effect. Vascular: No hyperdense vessel or unexpected calcification. Skull: Normal. Negative for fracture or focal lesion. Sinuses/Orbits: No acute finding. Other: None. IMPRESSION: 1. Chronic small vessel ischemic changes, no acute intracranial process. Electronically Signed   By: Randa Ngo M.D.   On: 02/03/2020 18:05   CT ANGIO CHEST PE W OR WO CONTRAST  Result Date: 02/04/2020 CLINICAL DATA:  Syncope EXAM: CT ANGIOGRAPHY CHEST WITH CONTRAST TECHNIQUE: Multidetector CT imaging of the chest was performed using the standard protocol during bolus administration of intravenous contrast. Multiplanar CT image reconstructions and MIPs were obtained to evaluate the vascular anatomy. CONTRAST:  24mL OMNIPAQUE IOHEXOL 350 MG/ML SOLN COMPARISON:  Jan 11, 2020 FINDINGS: Cardiovascular: There is a optimal opacification  of the pulmonary arteries. There is no central,segmental, or subsegmental filling defects within the pulmonary arteries. There is stable moderate cardiomegaly. Coronary artery calcifications are seen. There is scattered aortic atherosclerosis. No pericardial effusion or thickening. No evidence right heart strain. There is normal three-vessel brachiocephalic anatomy without proximal stenosis. The thoracic aorta is normal in appearance. Mediastinum/Nodes: No hilar, mediastinal, or axillary adenopathy. Thyroid gland, trachea, and esophagus demonstrate no significant findings. Lungs/Pleura: Centrilobular emphysematous changes are seen predominantly within both lung apices. There is bibasilar dependent scarring or atelectasis  noted. No large airspace consolidation is seen. There is mild hazy ground-glass opacity seen at both lung bases. No pleural effusion. Upper Abdomen: No acute abnormalities present in the visualized portions of the upper abdomen. Calcified gallstones are present. Musculoskeletal: No chest wall abnormality. No acute or significant osseous findings. Review of the MIP images confirms the above findings. IMPRESSION: No central, segmental, or subsegmental pulmonary embolism. Bibasilar dependent atelectasis or scarring. Emphysema (ICD10-J43.9). Aortic Atherosclerosis (ICD10-I70.0). Cholelithiasis Electronically Signed   By: Prudencio Pair M.D.   On: 02/04/2020 03:53   MR BRAIN WO CONTRAST  Result Date: 02/03/2020 CLINICAL DATA:  Initial evaluation for acute ataxia, stroke suspected. EXAM: MRI HEAD WITHOUT CONTRAST TECHNIQUE: Multiplanar, multiecho pulse sequences of the brain and surrounding structures were obtained without intravenous contrast. COMPARISON:  Comparison made with prior CT from earlier the same day. FINDINGS: Brain: Generalized age-related cerebral atrophy. Patchy and confluent T2/FLAIR hyperintensity seen within the periventricular and deep white matter both cerebral hemispheres as well as the pons, most consistent with chronic small vessel ischemic disease, moderate in nature. Multiple scattered remote lacunar infarcts noted about the bilateral thalami and pons. No abnormal foci of restricted diffusion to suggest acute or subacute ischemia. Gray-white matter differentiation maintained. No encephalomalacia to suggest chronic cortical infarction. No evidence for acute intracranial hemorrhage. Few scattered punctate chronic micro hemorrhages noted clustered about the deep gray nuclei, most likely related to chronic underlying hypertension. No mass lesion, midline shift or mass effect. No hydrocephalus or extra-axial fluid collection. Pituitary gland suprasellar region within normal limits. Midline  structures intact. Vascular: Major intracranial vascular flow voids are maintained. Skull and upper cervical spine: Craniocervical junction within normal limits. Bone marrow signal intensity normal. No scalp soft tissue abnormality. Sinuses/Orbits: Globes and orbital soft tissues demonstrate no acute finding. Patient status post bilateral ocular lens replacement. Paranasal sinuses are largely clear. No mastoid effusion. Inner ear structures grossly normal. Other: None. IMPRESSION: 1. No acute intracranial abnormality. 2. Age-related cerebral atrophy with moderate chronic microvascular ischemic disease, with multiple remote lacunar infarcts about the bilateral thalami and pons. Electronically Signed   By: Jeannine Boga M.D.   On: 02/03/2020 23:50   ECHOCARDIOGRAM COMPLETE  Result Date: 02/04/2020    ECHOCARDIOGRAM REPORT   Patient Name:   ELLERY MERONEY Date of Exam: 02/04/2020 Medical Rec #:  [id number]         Height:       72.0 in Accession #:    [id number]        Weight:       203.0 lb Date of Birth:  1941/06/16         BSA:          2.144 m Patient Age:    84 years          BP:           112/101 mmHg Patient Gender: M  HR:           63 bpm. Exam Location:  Inpatient Procedure: 2D Echo, Cardiac Doppler and Color Doppler Indications:    Syncope  History:        Patient has no prior history of Echocardiogram examinations.                 COPD and Stroke, Signs/Symptoms:Syncope and CKD; Risk                 Factors:Hypertension, Diabetes and Dyslipidemia.  Sonographer:    Dustin Flock Referring Phys: [id number] [place]  1. Left ventricular ejection fraction, by estimation, is 60 to 65%. The left ventricle has normal function. Left ventricular endocardial border not optimally defined to evaluate regional wall motion. There is mild concentric left ventricular hypertrophy. Left ventricular diastolic parameters are consistent with Grade I diastolic dysfunction (impaired  relaxation). Elevated left ventricular end-diastolic pressure.  2. Right ventricular systolic function is normal. The right ventricular size is mildly enlarged. There is mildly elevated pulmonary artery systolic pressure. The estimated right ventricular systolic pressure is 33.8 mmHg.  3. The mitral valve is normal in structure. Trivial mitral valve regurgitation. No evidence of mitral stenosis.  4. The aortic valve is tricuspid. Aortic valve regurgitation is not visualized. Mild to moderate aortic valve sclerosis/calcification is present, without any evidence of aortic stenosis.  5. The inferior vena cava is dilated in size with >50% respiratory variability, suggesting right atrial pressure of 8 mmHg. FINDINGS  Left Ventricle: Left ventricular ejection fraction, by estimation, is 60 to 65%. The left ventricle has normal function. Left ventricular endocardial border not optimally defined to evaluate regional wall motion. The left ventricular internal cavity size was normal in size. There is mild concentric left ventricular hypertrophy. Left ventricular diastolic parameters are consistent with Grade I diastolic dysfunction (impaired relaxation). Elevated left ventricular end-diastolic pressure. Right Ventricle: The right ventricular size is mildly enlarged. No increase in right ventricular wall thickness. Right ventricular systolic function is normal. There is mildly elevated pulmonary artery systolic pressure. The tricuspid regurgitant velocity is 2.96 m/s, and with an assumed right atrial pressure of 8 mmHg, the estimated right ventricular systolic pressure is 25.0 mmHg. Left Atrium: Left atrial size was normal in size. Right Atrium: Right atrial size was normal in size. Pericardium: There is no evidence of pericardial effusion. Mitral Valve: The mitral valve is normal in structure. Normal mobility of the mitral valve leaflets. Mild mitral annular calcification. Trivial mitral valve regurgitation. No evidence of  mitral valve stenosis. Tricuspid Valve: The tricuspid valve is normal in structure. Tricuspid valve regurgitation is mild . No evidence of tricuspid stenosis. Aortic Valve: The aortic valve is tricuspid. Aortic valve regurgitation is not visualized. Mild to moderate aortic valve sclerosis/calcification is present, without any evidence of aortic stenosis. Pulmonic Valve: The pulmonic valve was normal in structure. Pulmonic valve regurgitation is not visualized. No evidence of pulmonic stenosis. Aorta: The aortic root is normal in size and structure. Venous: The inferior vena cava is dilated in size with greater than 50% respiratory variability, suggesting right atrial pressure of 8 mmHg. IAS/Shunts: No atrial level shunt detected by color flow Doppler.  LEFT VENTRICLE PLAX 2D LVIDd:         4.30 cm  Diastology LVIDs:         2.80 cm  LV e' lateral:   13.70 cm/s LV PW:         1.20 cm  LV E/e' lateral: 4.4  LV IVS:        1.20 cm  LV e' medial:    7.95 cm/s LVOT diam:     2.40 cm  LV E/e' medial:  7.6 LV SV:         87 LV SV Index:   41 LVOT Area:     4.52 cm  RIGHT VENTRICLE RV Basal diam:  4.20 cm RV S prime:     6.53 cm/s TAPSE (M-mode): 2.4 cm LEFT ATRIUM             Index       RIGHT ATRIUM           Index LA diam:        3.20 cm 1.49 cm/m  RA Area:     15.60 cm LA Vol (A2C):   54.7 ml 25.51 ml/m RA Volume:   39.50 ml  18.42 ml/m LA Vol (A4C):   25.2 ml 11.75 ml/m LA Biplane Vol: 41.1 ml 19.17 ml/m  AORTIC VALVE LVOT Vmax:   76.00 cm/s LVOT Vmean:  47.900 cm/s LVOT VTI:    0.193 m  AORTA Ao Root diam: 2.90 cm MITRAL VALVE               TRICUSPID VALVE MV Area (PHT): 2.62 cm    TR Peak grad:   35.0 mmHg MV Decel Time: 289 msec    TR Vmax:        296.00 cm/s MV E velocity: 60.80 cm/s MV A velocity: 82.70 cm/s  SHUNTS MV E/A ratio:  0.74        Systemic VTI:  0.19 m                            Systemic Diam: 2.40 cm Fransico Him MD Electronically signed by Fransico Him MD Signature Date/Time: 02/04/2020/2:57:31  PM    Final         Scheduled Meds: . aspirin  81 mg Oral Daily  . atorvastatin  10 mg Oral Once per day on Tue Thu  . donepezil  10 mg Oral QHS  . enoxaparin (LOVENOX) injection  40 mg Subcutaneous Q24H  . gabapentin  100 mg Oral Daily  . insulin aspart  0-5 Units Subcutaneous QHS  . insulin aspart  0-9 Units Subcutaneous TID WC  . multivitamin with minerals  1 tablet Oral Daily  . pantoprazole  40 mg Oral Q0600  . sertraline  100 mg Oral Daily  . sodium chloride flush  3 mL Intravenous Once  . sodium chloride flush  3 mL Intravenous Q12H  . tamsulosin  0.4 mg Oral Daily  . vitamin B-12  50 mcg Oral Daily   Continuous Infusions: . sodium chloride 75 mL/hr at 02/05/20 0244     LOS: 0 days        Hosie Poisson, MD [facility]   To contact the attending provider between 7A-7P or the covering provider during after hours 7P-7A, please log into the web site [email or web address] and access using universal [place] password for that web site. If you do not have the password, please call the hospital operator.  02/05/2020, 12:29 PM

## 2020-02-05 NOTE — Progress Notes (Signed)
Inpatient Diabetes Program Recommendations  AACE/ADA: New Consensus Statement on Inpatient Glycemic Control (2015)  Target Ranges:  Prepandial:   less than 140 mg/dL      Peak postprandial:   less than 180 mg/dL (1-2 hours)      Critically ill patients:  140 - 180 mg/dL   Lab Results  Component Value Date   GLUCAP 154 (H) 02/05/2020   HGBA1C 9.8 (H) 02/04/2020    Review of Glycemic Control  Diabetes history: DM 2 Outpatient Diabetes medications: Amaryl 8 mg Daily, Actos 15 mg Daily Current orders for Inpatient glycemic control:  Novolog 0-9 units tid + hs  A1c 9.8%, uncontrolled at home  Inpatient Diabetes Program Recommendations:     Spoke with pt at beside and wife regarding Glucose control at home. Pt reports being on Amaryl 8 mg Daily and Actos 15 mg Daily. Reports compliance with meds. Checks glucose only in the morning. Discussed current A1c level of 9.8%. Pt reports eating a DM diet, however wife reports he drinks regular Pepsi at home. Discussed Glucose and A1c goals, and encouraged pt to drink diet, zero or light beverages, also discussed for pt to do a second check later in the day to see how high his glucose levels go.   Thanks,  Tama Headings RN, MSN, BC-ADM Inpatient Diabetes Coordinator Team Pager [phone] (8a-5p)

## 2020-02-05 NOTE — NC FL2 (Signed)
[place] LEVEL OF CARE SCREENING TOOL     IDENTIFICATION  Patient Name: Ronald Dyer Birthdate: [birth year] Sex: male Admission Date (Current Location): 02/03/2020  [facility] and [place] Number:  Herbalist and Address:  [facility], [place] [address], [place], [place] [address]      Provider Number: [id number]  Attending Physician Name and Address:  Hosie Poisson, MD  Relative Name and Phone Number:  Delsa Grana    Current Level of Care: Hospital Recommended Level of Care: [place] Prior Approval Number:    Date Approved/Denied:   PASRR Number: [id number]  Discharge Plan: SNF    Current Diagnoses: Patient Active Problem List   Diagnosis Date Noted  . Syncope 02/04/2020  . BPH (benign prostatic hyperplasia) 02/04/2020  . Confusion and disorientation 10/02/2019  . 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 ([place]) 04/24/2019  . Aphasia as late effect of cerebrovascular accident 04/24/2019  . Gait disturbance, post-stroke 04/24/2019  . 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 CVA (cerebral vascular accident) ([place]) 06/22/2017  . Colon cancer ([place]) 03/25/2014  . Type 2 diabetes mellitus ([place]) 10/18/2006  . Depression 10/18/2006  . ALCOHOL ABUSE, UNSPECIFIED 10/18/2006  . COPD 10/18/2006  . GASTROESOPHAGEAL REFLUX, NO ESOPHAGITIS 10/18/2006    Orientation RESPIRATION BLADDER Height & Weight     Self, Time, Situation, Place  Normal Continent Weight: 92.1 kg Height:  6' (182.9 cm)  BEHAVIORAL SYMPTOMS/MOOD NEUROLOGICAL BOWEL NUTRITION STATUS      Continent Diet  AMBULATORY STATUS COMMUNICATION OF NEEDS Skin   Extensive Assist Verbally Normal                       Personal Care Assistance Level of Assistance  Bathing, Dressing, Feeding Bathing Assistance: Limited assistance Feeding assistance:  Limited assistance Dressing Assistance: Limited assistance     Functional Limitations Info  Sight, Speech, Hearing Sight Info: Adequate Hearing Info: Adequate Speech Info: Adequate    SPECIAL CARE FACTORS FREQUENCY  PT (By licensed PT), OT (By licensed OT)     PT Frequency: five times a week OT Frequency: five times a week            Contractures Contractures Info: Not present    Additional Factors Info  Code Status, Allergies Code Status Info: full Allergies Info: Simvastation           Current Medications (02/05/2020):  This is the current hospital active medication list Current Facility-Administered Medications  Medication Dose Route Frequency Provider Last Rate Last Admin  . 0.9 %  sodium chloride infusion   Intravenous Continuous Hosie Poisson, MD 75 mL/hr at 02/05/20 0244 New Bag at 02/05/20 0244  . acetaminophen (TYLENOL) tablet 650 mg  650 mg Oral Q6H PRN Shela Leff, MD   650 mg at 02/05/20 1103   Or  . acetaminophen (TYLENOL) suppository 650 mg  650 mg Rectal Q6H PRN Shela Leff, MD      . alum & mag hydroxide-simeth (MAALOX/MYLANTA) 200-200-20 MG/5ML suspension 30 mL  30 mL Oral Q6H PRN Hosie Poisson, MD      . aspirin chewable tablet 81 mg  81 mg Oral Daily Shela Leff, MD   81 mg at 02/05/20 0953  . atorvastatin (LIPITOR) tablet 10 mg  10 mg Oral Once per day on Tue Thu Rathore, Vasundhra, MD   10 mg at 02/05/20 1000  . donepezil (ARICEPT) tablet  10 mg  10 mg Oral QHS Shela Leff, MD   10 mg at 02/04/20 2151  . enoxaparin (LOVENOX) injection 40 mg  40 mg Subcutaneous Q24H Shela Leff, MD   40 mg at 02/04/20 1408  . gabapentin (NEURONTIN) capsule 100 mg  100 mg Oral Daily Shela Leff, MD   100 mg at 02/05/20 0952  . insulin aspart (novoLOG) injection 0-5 Units  0-5 Units Subcutaneous QHS Shela Leff, MD      . insulin aspart (novoLOG) injection 0-9 Units  0-9 Units Subcutaneous TID WC Shela Leff, MD   1  Units at 02/05/20 0600  . multivitamin with minerals tablet 1 tablet  1 tablet Oral Daily Shela Leff, MD   1 tablet at 02/05/20 0952  . pantoprazole (PROTONIX) EC tablet 40 mg  40 mg Oral Q0600 Hosie Poisson, MD      . sertraline (ZOLOFT) tablet 100 mg  100 mg Oral Daily Shela Leff, MD   100 mg at 02/05/20 0953  . sodium chloride flush (NS) 0.9 % injection 3 mL  3 mL Intravenous Once Shela Leff, MD      . sodium chloride flush (NS) 0.9 % injection 3 mL  3 mL Intravenous Q12H Shela Leff, MD   3 mL at 02/04/20 2151  . tamsulosin (FLOMAX) capsule 0.4 mg  0.4 mg Oral Daily Shela Leff, MD   0.4 mg at 02/05/20 0952  . vitamin B-12 (CYANOCOBALAMIN) tablet 50 mcg  50 mcg Oral Daily Shela Leff, MD   50 mcg at 02/05/20 8372     Discharge Medications: Please see discharge summary for a list of discharge medications.  Relevant Imaging Results:  Relevant Lab Results:   Additional Information SSI [phone] , Novolog 0 to 5 units at bedtime, Novolog 0 to 9 units TID with meals  Donna Snooks, Edson Snowball, RN

## 2020-02-05 NOTE — TOC Initial Note (Addendum)
Transition of Care [facility]) - Initial/Assessment Note    Patient Details  Name: Ronald Dyer MRN: [id number] Date of Birth: 02-15-1941  Transition of Care [facility]) CM/SW Contact:    Marilu Favre, RN Phone Number: 02/05/2020, 11:34 AM  Clinical Narrative:                 Spoke to patient and wife Atoria at bedside. Discussed PT recommendations for SNF for short term rehab, both in agreement. Will start process and provide bed offers once received. Patient and wife voiced understanding. Patient has regular [employer] will submit for auth  1450 Provided medicare.gov with bed offers. Patient does not want to go to SNF. Wife states she will take him home at discharge. She would like [facility]. Called Tiffany with [facility] awaiting call back.   [facility] with [facility] can accept referral , however, RN/PT cannot see patient until next Tuesday.   Called Cory with [facility] could see patient Saturday or Sunday and Nurse could see patient Monday.   Both agencies can provide an aide.   This was explained to patient and wife . Both voiced understanding , they want to proceed with [facility] knowing start date is Tuesday.  Will ask MD for orders and face to face  Patient has a walker at home. Ordered wheelchair with [facility] .  Expected Discharge Plan: Skilled Nursing Facility Barriers to Discharge: Continued Medical Work up   Patient Goals and CMS Choice   CMS Medicare.gov Compare Post Acute Care list provided to:: Patient Choice offered to / list presented to : Spouse, Patient  Expected Discharge Plan and Services Expected Discharge Plan: [place]   Discharge Planning Services: CM Consult Post Acute Care Choice: [place] Living arrangements for the past 2 months: Single Family Home                 DME Arranged: N/A DME Agency: NA       HH Arranged: NA          Prior Living Arrangements/Services Living  arrangements for the past 2 months: Single Family Home Lives with:: Spouse Patient language and need for interpreter reviewed:: Yes        Need for Family Participation in Patient Care: Yes (Comment) Care giver support system in place?: Yes (comment)   Criminal Activity/Legal Involvement Pertinent to Current Situation/Hospitalization: No - Comment as needed  Activities of Daily Living Home Assistive Devices/Equipment: Cane (specify quad or straight), Walker (specify type) ADL Screening (condition at time of admission) Patient's cognitive ability adequate to safely complete daily activities?: Yes Is the patient deaf or have difficulty hearing?: No Does the patient have difficulty seeing, even when wearing glasses/contacts?: No Does the patient have difficulty concentrating, remembering, or making decisions?: Yes Patient able to express need for assistance with ADLs?: Yes Does the patient have difficulty dressing or bathing?: No Independently performs ADLs?: Yes (appropriate for developmental age) Does the patient have difficulty walking or climbing stairs?: No Weakness of Legs: Both Weakness of Arms/Hands: Both  Permission Sought/Granted   Permission granted to share information with : Yes, Verbal Permission Granted  Share Information with NAME: wife Synthia Innocent [place]           Emotional Assessment Appearance:: Appears stated age Attitude/Demeanor/Rapport: Engaged Affect (typically observed): Accepting Orientation: : Oriented to Self, Oriented to Place, Oriented to  Time, Oriented to Situation Alcohol / Substance Use: Not Applicable Psych  Involvement: No (comment)  Admission diagnosis:  Syncope and collapse [R55] Syncope [R55] Patient Active Problem List   Diagnosis Date Noted  . Syncope 02/04/2020  . BPH (benign prostatic hyperplasia) 02/04/2020  . Confusion and disorientation 10/02/2019  . Hemiparesis affecting dominant side as late effect of  cerebrovascular accident ([place]) 04/24/2019  . Aphasia as late effect of cerebrovascular accident 04/24/2019  . Gait disturbance, post-stroke 04/24/2019  . 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 CVA (cerebral vascular accident) ([place]) 06/22/2017  . Colon cancer ([place]) 03/25/2014  . Type 2 diabetes mellitus ([place]) 10/18/2006  . Depression 10/18/2006  . ALCOHOL ABUSE, UNSPECIFIED 10/18/2006  . COPD 10/18/2006  . GASTROESOPHAGEAL REFLUX, NO ESOPHAGITIS 10/18/2006   PCP:  [name], MD Pharmacy:   [place], [address] [place] [address] Phone: [phone] Fax: [phone]  [facility] [place] [address] [place] [address] Phone: [phone] Fax: [phone]     Social Determinants of Health (SDOH) Interventions    Readmission Risk Interventions No flowsheet data found.

## 2020-02-05 NOTE — Care Management (Signed)
°  °  Durable Medical Equipment  (From admission, onward)         Start     Ordered   02/05/20 1518  For home use only DME lightweight manual wheelchair with seat cushion  Once       Comments: Patient suffers from syncopal episodes which impairs their ability to perform daily activities like ambulating  in the home.  A cane will not resolve  issue with performing activities of daily living. A wheelchair will allow patient to safely perform daily activities. Patient is not able to propel themselves in the home using a standard weight wheelchair due to syncopal episodes. Patient can self propel in the lightweight wheelchair. Length of need lifetime . Accessories: elevating leg rests (ELRs), wheel locks, extensions and anti-tippers.  Seat and back cushions   02/05/20 1518

## 2020-02-05 NOTE — Plan of Care (Signed)
  Problem: Education: Goal: Knowledge of General Education information will improve Description: Including pain rating scale, medication(s)/side effects and non-pharmacologic comfort measures Outcome: Progressing   Problem: Clinical Measurements: Goal: Respiratory complications will improve Outcome: Progressing Note: On room air   Problem: Coping: Goal: Level of anxiety will decrease Outcome: Progressing   Problem: Elimination: Goal: Will not experience complications related to urinary retention Outcome: Progressing   Problem: Pain Managment: Goal: General experience of comfort will improve Outcome: Progressing Note: Prn tylenol given for indigestion type chest pain   Problem: Safety: Goal: Ability to remain free from injury will improve Outcome: Progressing

## 2020-02-05 NOTE — Consult Note (Addendum)
Palliative Medicine Inpatient Consult Note  Reason for consult:  Goals of Care  HPI:  Per intake H&P --> Ronald Dyer is a 79 y.o. male with medical history significant of aortic atherosclerosis, COPD depression, type II diabetes, frequent falls, GERD, hypertension, hyperlipidemia, memory loss, history of colon cancer, CVA presenting to the ED via EMS after having 2 syncopal events at home.  History provided by patient and wife at bedside.  He was in his usual state of health and went out on a walk around the house with his neighbor.   Palliative care has been consulted to address goals of care.   Clinical Assessment/Goals of Care: I have reviewed medical records including EPIC notes, labs and imaging, received report from bedside RN, assessed the patient.    I met with Ronald Dyer and his wife, Ronald Dyer at bedside  to further discuss diagnosis prognosis, GOC, EOL wishes, disposition and options.   I introduced Palliative Medicine as specialized medical care for people living with serious illness. It focuses on providing relief from the symptoms and stress of a serious illness. The goal is to improve quality of life for both the patient and the family.  I asked Ronald Dyer to tell me about himself. He shares that he is from [place], [place]. He moved around as a child as his father was, "chasing work." He spent four years in [employer] and then worked for [employer] which is what brought him to [place], [place]. He has been married to his wife, Ronald Dyer since 1963, they share a son and a daughter together. His children live locally in [place]. He is a prayerful man and ascribes to the [employer].   Prior to hospitalization Ronald Dyer was responsible for most bADLs though his wife did assist him with dressing. He uses a cane at baseline. He vocalizes "not cooking" as his wife has done all of the cooking and cleaning throughout their relationship.   A detailed discussion  was had today regarding advanced directives patient states that he has completed these in the past. Patients wife has offered to bring them in tomorrow.   Concepts specific to code status, artifical feeding and hydration, continued IV antibiotics and rehospitalization was had.  A MOST form was introduced. Tallie has great philosophical feelings regarding to pursue something versus not pursuing it. He clearly states even after all of the risks were explained including trauma and possibly like on a ventilator that he would want to try all necessary measures to "live." He did share with his wife and I that he would never want these measures long term and that he would not want to be in a vegetative state. I ask him if that meant he would want a "trial" of all treatments and if he was not projected to make a meaningful recovery then his wife would step in. He said that this is what he would want.   The difference between a aggressive medical intervention path  and a palliative comfort care path for this patient at this time was had. Values and goals of care important to patient and family were attempted to be elicited. The patient still gets great joy out of being active within his family and neighborhood. These are important aspects of his life.   Discussed the importance of continued conversation with family and their  medical providers regarding overall plan of care and treatment options, ensuring decisions are within the context of the patients values and GOCs.  Decision Maker: Patient can make decisions for himself  SUMMARY OF RECOMMENDATIONS   Full Code / Full scope of care  TOC --> OP Palliative Care  Code Status/Advance Care Planning: FULL CODE   Palliative Prophylaxis:  Turn Q2H, Pain, Mobility, oral care  Additional Recommendations (Limitations, Scope, Preferences):  Continue current scope of care   Psycho-social/Spiritual:   Desire for further Chaplaincy support: No  Additional  Recommendations: Education on advanced care planning   Prognosis: Unclear  Discharge Planning: Will discharge to SNF with outpatient Palliative Care  PPS: 60%   This conversation/these recommendations were discussed with patient primary care team, Dr. Karleen Hampshire  Time In: 1500 Time Out: 1615 Total Time: 75 Greater than 50%  of this time was spent counseling and coordinating care related to the above assessment and plan.  [place] Team Team Cell Phone: [phone] Please utilize secure chat with additional questions, if there is no response within 30 minutes please call the above phone number  Palliative Medicine Team providers are available by phone from 7am to 7pm daily and can be reached through the team cell phone.  Should this patient require assistance outside of these hours, please call the patient's attending physician.

## 2020-02-06 ENCOUNTER — Inpatient Hospital Stay (HOSPITAL_COMMUNITY): Payer: Medicare HMO

## 2020-02-06 DIAGNOSIS — R42 Dizziness and giddiness: Secondary | ICD-10-CM

## 2020-02-06 LAB — BASIC METABOLIC PANEL
Anion gap: 8 (ref 5–15)
BUN: 13 mg/dL (ref 8–23)
CO2: 22 mmol/L (ref 22–32)
Calcium: 9 mg/dL (ref 8.9–10.3)
Chloride: 110 mmol/L (ref 98–111)
Creatinine, Ser: 1.56 mg/dL — ABNORMAL HIGH (ref 0.61–1.24)
GFR calc Af Amer: 48 mL/min — ABNORMAL LOW (ref >60)
GFR calc non Af Amer: 41 mL/min — ABNORMAL LOW (ref >60)
Glucose, Bld: 213 mg/dL — ABNORMAL HIGH (ref 70–99)
Potassium: 4 mmol/L (ref 3.5–5.1)
Sodium: 140 mmol/L (ref 135–145)

## 2020-02-06 LAB — GLUCOSE, CAPILLARY: Glucose-Capillary: 126 mg/dL — ABNORMAL HIGH (ref 70–99)

## 2020-02-06 MED ORDER — MAGNESIUM SULFATE 2 GM/50ML IV SOLN
2.0000 g | Freq: Once | INTRAVENOUS | Status: AC
  Administered 2020-02-06: 2 g via INTRAVENOUS
  Filled 2020-02-06: qty 50

## 2020-02-06 MED ORDER — PANTOPRAZOLE SODIUM 40 MG PO TBEC: 40.0000 mg | DELAYED_RELEASE_TABLET | Freq: Every day | ORAL | 1 refills | Status: DC

## 2020-02-06 NOTE — Progress Notes (Signed)
Hydrologist [facility])  Hospital Liaison: RN note       Notified by [facility] manager of patient/family request for [facility] Palliative services at home after discharge.       Spoke with patients wife Synthia Innocent and patient at bedside to confirm interest. [facility] palliative brochure was given and explained Palliative services for home.      [place] Palliative team will follow up with patient after discharge.       Please call with any hospice or palliative related questions.       Thank you for this referral.       Clementeen Hoof, RN, BSN [place] (listed on [place] under [facility] [place] [facility] [place])   [phone]

## 2020-02-06 NOTE — Progress Notes (Signed)
Physical Therapy Treatment Patient Details Name: Ronald Dyer MRN: [id number] DOB: 05/18/1941 Today's Date: 02/06/2020    History of Present Illness Pt is an 79 y/o male admitted secondary to syncopal episodes. workup pending. PMH includes DM, CVA, COPD, and HTN.     PT Comments    Today's skilled session continued with mobility progression while monitoring pt for orthostatics. Pt with improved BP readings with mobility today. Did not pt to have whole body tremors with standing/pivot transfers. Pt's spouse states he has had these for the past few weeks. The pt remains confused at times with session, however is progressing toward goals. Acute PT to continue during pt's hospital stay.   Follow Up Recommendations  SNF;Supervision/Assistance - 24 hour     Equipment Recommendations  Other (comment) (TBD)    Precautions / Restrictions Precautions Precautions: Fall Precaution Comments: Watch BP  Restrictions Weight Bearing Restrictions: No     Mobility  Bed Mobility Overal bed mobility: Needs Assistance Bed Mobility: Supine to Sit;Sit to Supine     Supine to sit: Min guard;HOB elevated Sit to supine: Min guard   General bed mobility comments: cues on technique/initiation of movements. HOB 30 degrees with sitting up, HOB flat with lying down.  Transfers Overall transfer level: Needs assistance Equipment used: Rolling walker (2 wheeled) Transfers: Sit to/from [facility] Sit to Stand: Min assist Stand pivot transfers: Mod assist       General transfer comment: cues needed for correct hand placement and weight shifting. increased time and effort needed for maximal pt participation. with RW SPT bed<>bsc with mod assist for balance due to whole body tremors/shaking, directional cues needed for sequencing.      Cognition Arousal/Alertness: Awake/alert Behavior During Therapy: Flat affect Overall Cognitive Status: History of cognitive impairments - at  baseline           General Comments: pt with multiple questions throughout asking about bed features, thought the BP machine was a second IV pump, needed redirection to task/therapy session, at times rambling about one topic with sudden switch to another topic        Pertinent Vitals/Pain Pain Assessment: No/denies pain   Orthostatic BPs  Supine 131/69  Sitting 132/80  Sitting after 0 min 143/84  After transfer to [facility] 144/71       PT Goals (current goals can now be found in the care plan section) Acute Rehab PT Goals Patient Stated Goal: to get stronger and be able to walk PT Goal Formulation: With patient Time For Goal Achievement: 02/18/20 Potential to Achieve Goals: Good    Frequency    Min 2X/week      PT Plan Current plan remains appropriate    AM-PAC PT "6 Clicks" Mobility   Outcome Measure  Help needed turning from your back to your side while in a flat bed without using bedrails?: A Little Help needed moving from lying on your back to sitting on the side of a flat bed without using bedrails?: A Little Help needed moving to and from a bed to a chair (including a wheelchair)?: A Lot Help needed standing up from a chair using your arms (e.g., wheelchair or bedside chair)?: A Little Help needed to walk in hospital room?: A Lot Help needed climbing 3-5 steps with a railing? : Total 6 Click Score: 14    End of Session Equipment Utilized During Treatment: Gait belt Activity Tolerance: Patient tolerated treatment well Patient left: in bed;with call bell/phone within reach;with bed alarm  set;with family/visitor present (HOB at 30 degrees) Nurse Communication: Mobility status PT Visit Diagnosis: Unsteadiness on feet (R26.81);Muscle weakness (generalized) (M62.81);Difficulty in walking, not elsewhere classified (R26.2)     Time: 1205-1229 PT Time Calculation (min) (ACUTE ONLY): 24 min  Charges:  $Therapeutic Activity: 23-37 mins                     Willow Ora, PTA, [facility] Acute [employer] [phone] 02/06/20, 12:44 PM   Willow Ora 02/06/2020, 12:41 PM

## 2020-02-06 NOTE — Discharge Summary (Signed)
Physician Discharge Summary  Ronald Dyer [place] ZOX:[id number] DOB: [birth year] DOA: 02/03/2020  PCP: [name], MD  Admit date: 02/03/2020 Discharge date: 02/06/2020  Admitted From: Home.  Disposition:  Home.   Recommendations for Outpatient Follow-up:  1. Follow up with PCP in 1-2 weeks, discuss regarding DM medications.  2. Please obtain BMP/CBC in one week 3. Please follow up palliative care consult on discharge.  4. We have discontinued your blood pressure medications as your BP were running normal.   Home Health:yes  Discharge Condition: stable.  CODE STATUS:full code.  Diet recommendation: Heart Healthy  Brief/Interim Summary: 79 year old gentleman with prior h/oCOPD, depression, type 2 diabetes, GERD, essential hypertension, hyperlipidemia presents to ED after 2 syncopal episodes. He was admitted for further evaluation. MRI of the brain does not show any acute intracranial abnormality. EKG shows bradycardia without any ischemic changes or evidence of arrhythmias.  Telemetry monitoring does not show any arrhythmias. CT angiogram of the chest is negative for pulmonary embolism. Echocardiogram shows Left ventricular ejection fraction, by estimation, is 60 to 65%. The left ventricle has normal function. Left ventricular endocardial border not optimally defined to evaluate regional wall motion. There is mild concentric left ventricular hypertrophy. Left ventricular diastolic parameters are consistent with Grade I diastolic dysfunction (impaired relaxation). Elevated left ventricular end-diastolic pressure.  PT evaluation recommending SNF.family refused SNF and wanted to go home with home health PT AND OT.   Discharge Diagnoses:  Principal Problem:   Syncope Active Problems:   Type 2 diabetes mellitus (HCC)   Depression   CVA (cerebral vascular accident) ([place])   BPH (benign prostatic hyperplasia)   Goals of care, counseling/discussion   Palliative care by  specialist  Recurrent syncope probably secondary to orthostatic hypotension and dizziness. MRI of the brain ruled out acute stroke and CT angiogram of the chest is negative for PE.  Echocardiogram does not show any major wall motion abnormalities.  Telemetry monitoring overnight shows sinus bradycardia. Carotid duplex does not show significant stenosis.  PT eval ,recommending SNF.  TOC on board for SNF placement. Family refused and wanted to go home with home health.    Uncontrolled diabetes mellitus Last hemoglobin A1c is 9.8. Discussed with the patient's wife at bedside that he needs to be on insulin. They deferred using insulin and wanted to follow up with PCP regarding the DM medications.     History of CVA MRI of the brain did not show any acute stroke at this time. Continue with aspirin and statin at this time.    Substernal burning/GERD Continue with Protonix and Maalox.   BPH Continue with Flomax.   Mild microcytic anemia Hemoglobin around 12.  Continue to monitor  In view of higher 64-month mortality rate palliative care consulted for goals of care    Essential hypertension:  We have held lisinopril and HCTZ for orthostatic BP parameters.   Discharge Instructions  Discharge Instructions    Diet - low sodium heart healthy   Complete by: As directed    Discharge instructions   Complete by: As directed    Follow up with PCP in one week regarding diabetic medications.     Allergies as of 02/06/2020      Reactions   Simvastatin    High CPK       Medication List    STOP taking these medications   hydrochlorothiazide 25 MG tablet Commonly known as: HYDRODIURIL   lidocaine 5 % Commonly known as: Lidoderm   lisinopril 40 MG  tablet Commonly known as: ZESTRIL     TAKE these medications   aspirin 81 MG tablet Take 81 mg by mouth daily. Notes to patient: Tomorrow, 02/07/20.   atorvastatin 10 MG tablet Commonly known as: LIPITOR Take  10 mg by mouth 2 (two) times a week. Tuesdays & Thursdays. Notes to patient: Resume home schedule.   donepezil 10 MG tablet Commonly known as: ARICEPT TAKE 1 TABLET BY MOUTH EVERYDAY AT BEDTIME Notes to patient: Tonight, 02/06/20.   gabapentin 100 MG capsule Commonly known as: NEURONTIN Take 100 mg by mouth daily. Notes to patient: Tomorrow, 02/07/20.   glimepiride 4 MG tablet Commonly known as: AMARYL Take 8 mg by mouth daily. Notes to patient: Tomorrow, 02/07/20.   multivitamin with minerals tablet Take 1 tablet by mouth daily. Notes to patient: Tomorrow, 02/07/20.   pantoprazole 40 MG tablet Commonly known as: PROTONIX Take 1 tablet (40 mg total) by mouth daily at 6 (six) AM. Start taking on: February 07, 2020 Notes to patient: Tomorrow, 02/07/20.   pioglitazone 15 MG tablet Commonly known as: ACTOS Take 15 mg by mouth daily. Notes to patient: Tomorrow, 02/07/20.   sertraline 100 MG tablet Commonly known as: ZOLOFT Take 100 mg by mouth daily. Notes to patient: Tomorrow, 02/07/20.   tamsulosin 0.4 MG Caps capsule Commonly known as: FLOMAX Take 0.4 mg by mouth daily. Take for 1 month for enlargement of the prostate Notes to patient: Tomorrow, 02/07/20.   vitamin B-12 100 MCG tablet Commonly known as: CYANOCOBALAMIN Take 50 mcg by mouth daily. Notes to patient: Tomorrow, 02/07/20.            Durable Medical Equipment  (From admission, onward)         Start     Ordered   02/05/20 1518  For home use only DME lightweight manual wheelchair with seat cushion  Once       Comments: Patient suffers from syncopal episodes which impairs their ability to perform daily activities like ambulating  in the home.  A cane will not resolve  issue with performing activities of daily living. A wheelchair will allow patient to safely perform daily activities. Patient is not able to propel themselves in the home using a standard weight wheelchair due to syncopal episodes. Patient can self  propel in the lightweight wheelchair. Length of need lifetime . Accessories: elevating leg rests (ELRs), wheel locks, extensions and anti-tippers.  Seat and back cushions   02/05/20 1518          Follow-up Information    [facility] Follow up.   Specialty: [facility] Contact information: [address] [place]  [address] [phone]        [facility] Follow up.   Specialty: PALLIATIVE CARE Why: palliative Care  Contact information: [place]       [name], MD. Schedule an appointment as soon as possible for a visit in 1 week(s).   Specialty: Family Medicine Contact information: [place] [address] [phone]              Allergies  Allergen Reactions  . Simvastatin     High CPK     Consultations:  Palliative care consult.    Procedures/Studies: X-ray chest PA and lateral  Result Date: 02/04/2020 CLINICAL DATA:  Syncope EXAM: CHEST - 2 VIEW COMPARISON:  12/12/2019 FINDINGS: Cardiomegaly. Bibasilar atelectasis and mild vascular congestion. No overt edema. No effusions or  acute bony abnormality. Low lung volumes. IMPRESSION: Cardiomegaly with vascular congestion. Low lung volumes with bibasilar atelectasis. Electronically Signed   By: Rolm Baptise M.D.   On: 02/04/2020 02:19   DG Chest 2 View  Result Date: 01/11/2020 CLINICAL DATA:  Shortness of breath. EXAM: CHEST - 2 VIEW COMPARISON:  October 04, 2015 FINDINGS: Mild, diffuse chronic appearing increased lung markings are seen. Mild left basilar linear atelectasis is noted. There is no evidence of a pleural effusion or pneumothorax. The heart size and mediastinal contours are within normal limits. The visualized skeletal structures are unremarkable. IMPRESSION: Chronic appearing increased lung markings with mild left basilar linear atelectasis. Electronically Signed   By: Virgina Norfolk M.D.    On: 01/11/2020 17:20   CT Head Wo Contrast  Result Date: 02/03/2020 CLINICAL DATA:  Syncope, bradycardia EXAM: CT HEAD WITHOUT CONTRAST TECHNIQUE: Contiguous axial images were obtained from the base of the skull through the vertex without intravenous contrast. COMPARISON:  04/19/2017 FINDINGS: Brain: Confluent hypodensities throughout the periventricular white matter are compatible with chronic small vessel ischemic changes, and stable since prior exam. No evidence of acute infarct or hemorrhage. The lateral ventricles and remaining midline structures are unremarkable. No acute extra-axial fluid collections. No mass effect. Vascular: No hyperdense vessel or unexpected calcification. Skull: Normal. Negative for fracture or focal lesion. Sinuses/Orbits: No acute finding. Other: None. IMPRESSION: 1. Chronic small vessel ischemic changes, no acute intracranial process. Electronically Signed   By: Randa Ngo M.D.   On: 02/03/2020 18:05   CT ANGIO CHEST PE W OR WO CONTRAST  Result Date: 02/04/2020 CLINICAL DATA:  Syncope EXAM: CT ANGIOGRAPHY CHEST WITH CONTRAST TECHNIQUE: Multidetector CT imaging of the chest was performed using the standard protocol during bolus administration of intravenous contrast. Multiplanar CT image reconstructions and MIPs were obtained to evaluate the vascular anatomy. CONTRAST:  52mL OMNIPAQUE IOHEXOL 350 MG/ML SOLN COMPARISON:  Jan 11, 2020 FINDINGS: Cardiovascular: There is a optimal opacification of the pulmonary arteries. There is no central,segmental, or subsegmental filling defects within the pulmonary arteries. There is stable moderate cardiomegaly. Coronary artery calcifications are seen. There is scattered aortic atherosclerosis. No pericardial effusion or thickening. No evidence right heart strain. There is normal three-vessel brachiocephalic anatomy without proximal stenosis. The thoracic aorta is normal in appearance. Mediastinum/Nodes: No hilar, mediastinal, or axillary  adenopathy. Thyroid gland, trachea, and esophagus demonstrate no significant findings. Lungs/Pleura: Centrilobular emphysematous changes are seen predominantly within both lung apices. There is bibasilar dependent scarring or atelectasis noted. No large airspace consolidation is seen. There is mild hazy ground-glass opacity seen at both lung bases. No pleural effusion. Upper Abdomen: No acute abnormalities present in the visualized portions of the upper abdomen. Calcified gallstones are present. Musculoskeletal: No chest wall abnormality. No acute or significant osseous findings. Review of the MIP images confirms the above findings. IMPRESSION: No central, segmental, or subsegmental pulmonary embolism. Bibasilar dependent atelectasis or scarring. Emphysema (ICD10-J43.9). Aortic Atherosclerosis (ICD10-I70.0). Cholelithiasis Electronically Signed   By: Prudencio Pair M.D.   On: 02/04/2020 03:53   MR BRAIN WO CONTRAST  Result Date: 02/03/2020 CLINICAL DATA:  Initial evaluation for acute ataxia, stroke suspected. EXAM: MRI HEAD WITHOUT CONTRAST TECHNIQUE: Multiplanar, multiecho pulse sequences of the brain and surrounding structures were obtained without intravenous contrast. COMPARISON:  Comparison made with prior CT from earlier the same day. FINDINGS: Brain: Generalized age-related cerebral atrophy. Patchy and confluent T2/FLAIR hyperintensity seen within the periventricular and deep white matter both cerebral hemispheres as well as the pons,  most consistent with chronic small vessel ischemic disease, moderate in nature. Multiple scattered remote lacunar infarcts noted about the bilateral thalami and pons. No abnormal foci of restricted diffusion to suggest acute or subacute ischemia. Gray-white matter differentiation maintained. No encephalomalacia to suggest chronic cortical infarction. No evidence for acute intracranial hemorrhage. Few scattered punctate chronic micro hemorrhages noted clustered about the deep  gray nuclei, most likely related to chronic underlying hypertension. No mass lesion, midline shift or mass effect. No hydrocephalus or extra-axial fluid collection. Pituitary gland suprasellar region within normal limits. Midline structures intact. Vascular: Major intracranial vascular flow voids are maintained. Skull and upper cervical spine: Craniocervical junction within normal limits. Bone marrow signal intensity normal. No scalp soft tissue abnormality. Sinuses/Orbits: Globes and orbital soft tissues demonstrate no acute finding. Patient status post bilateral ocular lens replacement. Paranasal sinuses are largely clear. No mastoid effusion. Inner ear structures grossly normal. Other: None. IMPRESSION: 1. No acute intracranial abnormality. 2. Age-related cerebral atrophy with moderate chronic microvascular ischemic disease, with multiple remote lacunar infarcts about the bilateral thalami and pons. Electronically Signed   By: Jeannine Boga M.D.   On: 02/03/2020 23:50   ECHOCARDIOGRAM COMPLETE  Result Date: 02/04/2020    ECHOCARDIOGRAM REPORT   Patient Name:   Ronald Dyer Date of Exam: 02/04/2020 Medical Rec #:  [id number]         Height:       72.0 in Accession #:    [id number]        Weight:       203.0 lb Date of Birth:  [birth year]         BSA:          2.144 m Patient Age:    68 years          BP:           112/101 mmHg Patient Gender: M                 HR:           63 bpm. Exam Location:  Inpatient Procedure: 2D Echo, Cardiac Doppler and Color Doppler Indications:    Syncope  History:        Patient has no prior history of Echocardiogram examinations.                 COPD and Stroke, Signs/Symptoms:Syncope and CKD; Risk                 Factors:Hypertension, Diabetes and Dyslipidemia.  Sonographer:    Dustin Flock Referring Phys: [id number] [place]  1. Left ventricular ejection fraction, by estimation, is 60 to 65%. The left ventricle has normal function. Left  ventricular endocardial border not optimally defined to evaluate regional wall motion. There is mild concentric left ventricular hypertrophy. Left ventricular diastolic parameters are consistent with Grade I diastolic dysfunction (impaired relaxation). Elevated left ventricular end-diastolic pressure.  2. Right ventricular systolic function is normal. The right ventricular size is mildly enlarged. There is mildly elevated pulmonary artery systolic pressure. The estimated right ventricular systolic pressure is 01.7 mmHg.  3. The mitral valve is normal in structure. Trivial mitral valve regurgitation. No evidence of mitral stenosis.  4. The aortic valve is tricuspid. Aortic valve regurgitation is not visualized. Mild to moderate aortic valve sclerosis/calcification is present, without any evidence of aortic stenosis.  5. The inferior vena cava is dilated in size with >50% respiratory variability, suggesting right atrial pressure of 8 mmHg. FINDINGS  Left Ventricle: Left ventricular ejection fraction, by estimation, is 60 to 65%. The left ventricle has normal function. Left ventricular endocardial border not optimally defined to evaluate regional wall motion. The left ventricular internal cavity size was normal in size. There is mild concentric left ventricular hypertrophy. Left ventricular diastolic parameters are consistent with Grade I diastolic dysfunction (impaired relaxation). Elevated left ventricular end-diastolic pressure. Right Ventricle: The right ventricular size is mildly enlarged. No increase in right ventricular wall thickness. Right ventricular systolic function is normal. There is mildly elevated pulmonary artery systolic pressure. The tricuspid regurgitant velocity is 2.96 m/s, and with an assumed right atrial pressure of 8 mmHg, the estimated right ventricular systolic pressure is 43.3 mmHg. Left Atrium: Left atrial size was normal in size. Right Atrium: Right atrial size was normal in size.  Pericardium: There is no evidence of pericardial effusion. Mitral Valve: The mitral valve is normal in structure. Normal mobility of the mitral valve leaflets. Mild mitral annular calcification. Trivial mitral valve regurgitation. No evidence of mitral valve stenosis. Tricuspid Valve: The tricuspid valve is normal in structure. Tricuspid valve regurgitation is mild . No evidence of tricuspid stenosis. Aortic Valve: The aortic valve is tricuspid. Aortic valve regurgitation is not visualized. Mild to moderate aortic valve sclerosis/calcification is present, without any evidence of aortic stenosis. Pulmonic Valve: The pulmonic valve was normal in structure. Pulmonic valve regurgitation is not visualized. No evidence of pulmonic stenosis. Aorta: The aortic root is normal in size and structure. Venous: The inferior vena cava is dilated in size with greater than 50% respiratory variability, suggesting right atrial pressure of 8 mmHg. IAS/Shunts: No atrial level shunt detected by color flow Doppler.  LEFT VENTRICLE PLAX 2D LVIDd:         4.30 cm  Diastology LVIDs:         2.80 cm  LV e' lateral:   13.70 cm/s LV PW:         1.20 cm  LV E/e' lateral: 4.4 LV IVS:        1.20 cm  LV e' medial:    7.95 cm/s LVOT diam:     2.40 cm  LV E/e' medial:  7.6 LV SV:         87 LV SV Index:   41 LVOT Area:     4.52 cm  RIGHT VENTRICLE RV Basal diam:  4.20 cm RV S prime:     6.53 cm/s TAPSE (M-mode): 2.4 cm LEFT ATRIUM             Index       RIGHT ATRIUM           Index LA diam:        3.20 cm 1.49 cm/m  RA Area:     15.60 cm LA Vol (A2C):   54.7 ml 25.51 ml/m RA Volume:   39.50 ml  18.42 ml/m LA Vol (A4C):   25.2 ml 11.75 ml/m LA Biplane Vol: 41.1 ml 19.17 ml/m  AORTIC VALVE LVOT Vmax:   76.00 cm/s LVOT Vmean:  47.900 cm/s LVOT VTI:    0.193 m  AORTA Ao Root diam: 2.90 cm MITRAL VALVE               TRICUSPID VALVE MV Area (PHT): 2.62 cm    TR Peak grad:   35.0 mmHg MV Decel Time: 289 msec    TR Vmax:        296.00 cm/s MV E  velocity: 60.80 cm/s MV A  velocity: 82.70 cm/s  SHUNTS MV E/A ratio:  0.74        Systemic VTI:  0.19 m                            Systemic Diam: 2.40 cm Fransico Him MD Electronically signed by Fransico Him MD Signature Date/Time: 02/04/2020/2:57:31 PM    Final    CT Angio Chest/Abd/Pel for Dissection W and/or Wo Contrast  Result Date: 01/11/2020 CLINICAL DATA:  Short of breath, mid back pain EXAM: CT ANGIOGRAPHY CHEST, ABDOMEN AND PELVIS TECHNIQUE: Non-contrast CT of the chest was initially obtained. Multidetector CT imaging through the chest, abdomen and pelvis was performed using the standard protocol during bolus administration of intravenous contrast. Multiplanar reconstructed images and MIPs were obtained and reviewed to evaluate the vascular anatomy. CONTRAST:  32mL OMNIPAQUE IOHEXOL 350 MG/ML SOLN COMPARISON:  07/01/2014 FINDINGS: CTA CHEST FINDINGS Cardiovascular:  The heart is not enlarged. No pericardial effusion. There is extensive atherosclerosis throughout the coronary vasculature, stable. The thoracic aorta is normal in caliber with no aneurysm or dissection. Minimal atherosclerosis. Mediastinum/Nodes: No enlarged mediastinal, hilar, or axillary lymph nodes. Thyroid gland, trachea, and esophagus demonstrate no significant findings. Lungs/Pleura: Upper lobe predominant emphysema. Bibasilar scarring without superimposed airspace disease, effusion, or pneumothorax. Central airways are patent. Musculoskeletal: No acute or destructive bony lesions. Reconstructed images demonstrate no additional findings. Review of the MIP images confirms the above findings. CTA ABDOMEN AND PELVIS FINDINGS VASCULAR Aorta: Normal caliber aorta without aneurysm, dissection, vasculitis or significant stenosis. Diffuse atherosclerosis. Celiac: Patent without evidence of aneurysm, dissection, vasculitis or significant stenosis. SMA: Patent without evidence of aneurysm, dissection, vasculitis or significant stenosis. Renals:  Both renal arteries are patent without evidence of aneurysm, dissection, vasculitis, fibromuscular dysplasia or significant stenosis. IMA: Patent without evidence of aneurysm, dissection, vasculitis or significant stenosis. Inflow: Patent without evidence of aneurysm, dissection, vasculitis or significant stenosis. Diffuse atherosclerosis. Veins: No obvious venous abnormality within the limitations of this arterial phase study. Review of the MIP images confirms the above findings. NON-VASCULAR Hepatobiliary: Mild diffuse hepatic steatosis. No focal liver abnormality. Calcified gallstones are identified without cholecystitis. Pancreas: Unremarkable. No pancreatic ductal dilatation or surrounding inflammatory changes. Spleen: Normal in size without focal abnormality. Adrenals/Urinary Tract: There is bilateral renal cortical thinning. No focal renal abnormalities. Stable nonspecific adrenal nodularity, likely hyperplasia or benign adenomas. The bladder is unremarkable. Stomach/Bowel: Postsurgical changes from right hemicolectomy. No bowel obstruction or ileus. No bowel wall thickening or inflammatory change. Lymphatic: No pathologic adenopathy. Reproductive: Stable enlarged prostate measuring 8.3 x 7.8 x 6.3 cm. Other: There are bilateral fat containing inguinal hernias. No bowel herniation. No free fluid or free gas. Musculoskeletal: There are no acute or destructive bony lesions. Reconstructed images demonstrate no additional findings. Review of the MIP images confirms the above findings. IMPRESSION: 1. No evidence of aortic dissection or aneurysm. 2. Aortic Atherosclerosis (ICD10-I70.0) and Emphysema (ICD10-J43.9). 3. Coronary artery atherosclerosis. 4. Cholelithiasis without cholecystitis. 5. Stable prostatomegaly. 6. Hepatic steatosis. Electronically Signed   By: Randa Ngo M.D.   On: 01/11/2020 18:57   VAS US CAROTID  Result Date: 02/06/2020 Carotid Arterial Duplex Study Indications:  Syncope. Risk  Factors: Hypertension, Diabetes. Performing Technologist: June Leap RDMS, RVT  Examination Guidelines: A complete evaluation includes B-mode imaging, spectral Doppler, color Doppler, and power Doppler as needed of all accessible portions of each vessel. Bilateral testing is considered an integral part of a complete examination. Limited examinations for reoccurring  indications may be performed as noted.  Right Carotid Findings: +----------+--------+--------+--------+------------------+--------+           PSV cm/sEDV cm/sStenosisPlaque DescriptionComments +----------+--------+--------+--------+------------------+--------+ CCA Prox  120     8                                          +----------+--------+--------+--------+------------------+--------+ CCA Distal79      14                                         +----------+--------+--------+--------+------------------+--------+ ICA Prox  64      9       1-39%                              +----------+--------+--------+--------+------------------+--------+ ICA Distal53      10                                         +----------+--------+--------+--------+------------------+--------+ ECA       86      11                                         +----------+--------+--------+--------+------------------+--------+ +----------+--------+-------+----------------+-------------------+           PSV cm/sEDV cmsDescribe        Arm Pressure (mmHG) +----------+--------+-------+----------------+-------------------+ [id number]            Multiphasic, WNL                    +----------+--------+-------+----------------+-------------------+ +---------+--------+--+--------+-+---------+ VertebralPSV cm/s41EDV cm/s6Antegrade +---------+--------+--+--------+-+---------+  Left Carotid Findings: +----------+--------+--------+--------+------------------+--------+           PSV cm/sEDV cm/sStenosisPlaque DescriptionComments  +----------+--------+--------+--------+------------------+--------+ CCA Prox  108     19                                         +----------+--------+--------+--------+------------------+--------+ CCA Distal93      11                                         +----------+--------+--------+--------+------------------+--------+ ICA Prox  85      17      1-39%   hypoechoic                 +----------+--------+--------+--------+------------------+--------+ ICA Distal75      11                                         +----------+--------+--------+--------+------------------+--------+ ECA       116     17                                         +----------+--------+--------+--------+------------------+--------+ +----------+--------+--------+----------------+-------------------+  PSV cm/sEDV cm/sDescribe        Arm Pressure (mmHG) +----------+--------+--------+----------------+-------------------+ Subclavian160             Multiphasic, WNL                    +----------+--------+--------+----------------+-------------------+ +---------+--------+--+--------+--+---------+ VertebralPSV cm/s43EDV cm/s10Antegrade +---------+--------+--+--------+--+---------+   Summary: Right Carotid: Velocities in the right ICA are consistent with a 1-39% stenosis. Left Carotid: Velocities in the left ICA are consistent with a 1-39% stenosis.  *See table(s) above for measurements and observations.  Electronically signed by Servando Snare MD on 02/06/2020 at 4:23:53 PM.    Final        Subjective: No new complaints, wants to go home.   Discharge Exam: Vitals:   02/06/20 0543 02/06/20 1244  BP: (!) 162/77 119/64  Pulse: 61 (!) 53  Resp: 18 18  Temp: 97.9 F (36.6 C) 98.6 F (37 C)  SpO2: 96% 96%   Vitals:   02/05/20 2057 02/06/20 0016 02/06/20 0543 02/06/20 1244  BP: 127/67 (!) 151/86 (!) 162/77 119/64  Pulse: (!) 54 62 61 (!) 53  Resp: 17 17 18 18   Temp: 98 F  (36.7 C) 98 F (36.7 C) 97.9 F (36.6 C) 98.6 F (37 C)  TempSrc: Oral Oral Oral Oral  SpO2: 97% 100% 96% 96%  Weight:   100.3 kg   Height:        General: Pt is alert, awake, not in acute distress Cardiovascular: RRR, S1/S2 +, no rubs, no gallops Respiratory: CTA bilaterally, no wheezing, no rhonchi Abdominal: Soft, NT, ND, bowel sounds + Extremities: no edema, no cyanosis    The results of significant diagnostics from this hospitalization (including imaging, microbiology, ancillary and laboratory) are listed below for reference.     Microbiology: No results found for this or any previous visit (from the past 240 hour(s)).   Labs: BNP (last 3 results) Recent Labs    01/11/20 1631  BNP 95.0   Basic Metabolic Panel: Recent Labs  Lab 02/03/20 1452 02/04/20 1121 02/05/20 1125 02/06/20 0827  NA 140 138 137 140  K 4.0 3.8 4.2 4.0  CL 107 108 107 110  CO2 22 23 22 22   GLUCOSE 228* 200* 151* 213*  BUN 20 17 13 13   CREATININE 1.65* 1.54* 1.48* 1.56*  CALCIUM 9.4 9.0 9.5 9.0  MG  --   --  1.6*  --    Liver Function Tests: No results for input(s): AST, ALT, ALKPHOS, BILITOT, PROT, ALBUMIN in the last 168 hours. No results for input(s): LIPASE, AMYLASE in the last 168 hours. No results for input(s): AMMONIA in the last 168 hours. CBC: Recent Labs  Lab 02/03/20 1452 02/05/20 1125  WBC 10.1 7.2  HGB 13.1 12.4*  HCT 40.3 38.0*  MCV 81.3 79.2*  PLT 148* 157   Cardiac Enzymes: No results for input(s): CKTOTAL, CKMB, CKMBINDEX, TROPONINI in the last 168 hours. BNP: Invalid input(s): POCBNP CBG: Recent Labs  Lab 02/05/20 0553 02/05/20 1106 02/05/20 1613 02/05/20 2127 02/06/20 0618  GLUCAP 122* 154* 199* 145* 126*   D-Dimer Recent Labs    02/04/20 0149  DDIMER 4.92*   Hgb A1c Recent Labs    02/04/20 0149  HGBA1C 9.8*   Lipid Profile No results for input(s): CHOL, HDL, LDLCALC, TRIG, CHOLHDL, LDLDIRECT in the last 72 hours. Thyroid function  studies Recent Labs    02/04/20 0839  TSH 1.505   Anemia work up No results for input(s): VITAMINB12, FOLATE, FERRITIN, TIBC,  IRON, RETICCTPCT in the last 72 hours. Urinalysis    Component Value Date/Time   COLORURINE YELLOW 02/03/2020 1452   APPEARANCEUR CLEAR 02/03/2020 1452   LABSPEC 1.006 02/03/2020 1452   PHURINE 7.0 02/03/2020 1452   GLUCOSEU >=500 (A) 02/03/2020 1452   HGBUR MODERATE (A) 02/03/2020 1452   BILIRUBINUR NEGATIVE 02/03/2020 1452   KETONESUR NEGATIVE 02/03/2020 1452   PROTEINUR NEGATIVE 02/03/2020 1452   NITRITE NEGATIVE 02/03/2020 1452   LEUKOCYTESUR NEGATIVE 02/03/2020 1452   Sepsis Labs Invalid input(s): PROCALCITONIN,  WBC,  LACTICIDVEN Microbiology No results found for this or any previous visit (from the past 240 hour(s)).   Time coordinating discharge:32 minutes.   SIGNED:   Hosie Poisson, MD  [facility] 02/06/2020, 5:37 PM

## 2020-02-06 NOTE — Progress Notes (Signed)
   Palliative Medicine Inpatient Follow Up Note   HPI: Per intake H&P --> Ronald Mustache [place]is a 79 y.o.malewith medical history significant ofaortic atherosclerosis, COPD depression, type II diabetes, frequent falls, GERD, hypertension, hyperlipidemia, memory loss, history of colon cancer, CVA presenting to the ED via EMS after having 2 syncopal events at home.History provided by patient and wife at bedside. He was in his usual state of health and went out on a walk around the house with his neighbor.   Palliative care has been consulted to address goals of care.   Today's Discussion (02/06/2020): Chart reviewed. I met with Ronald Dyer and his wife at bedside. They endorsed no questions from the day prior. Ronald Dyer's wife did bring in a copy of the advanced directives though there was only the final sheet with signatures identified. Request for completed version.  Patient endorses optimism for continued improvements.  Discussed the importance of continued conversation with family and their  medical providers regarding overall plan of care and treatment options, ensuring decisions are within the context of the patients values and GOCs.  "Hard Choices for [employer]" booklet.   Questions and concerns addressed   SUMMARY OF RECOMMENDATIONS Full Code / Full scope of care  OP Palliative Care  Time Spent: 15 Greater than 50% of the time was spent in counseling and coordination of care ______________________________________________________________________________________ [place] Team Team Cell Phone: [phone] Please utilize secure chat with additional questions, if there is no response within 30 minutes please call the above phone number  Palliative Medicine Team providers are available by phone from 7am to 7pm daily and can be reached through the team cell phone.  Should this patient require assistance outside of these hours, please call the  patient's attending physician.

## 2020-02-06 NOTE — Progress Notes (Signed)
Orthostatic blood pressures checked with PT. Lying/ HOB 30: 131/69 HR 53 Sitting: 132/80 HR 56 Standing: 143/84 HR89 Moved to BSC: 144/71 HR 64

## 2020-02-06 NOTE — TOC Progression Note (Signed)
Transition of Care [facility]) - Progression Note    Patient Details  Name: Ronald Dyer MRN: [id number] Date of Birth: 1940/10/18  Transition of [facility]) CM/SW Contact  Jacalyn Lefevre Edson Snowball, RN Phone Number: 02/06/2020, 8:45 AM  Clinical Narrative:     Arranged HHPT/RN with [facility].   Arranged palliative care with [facility] ( spoke to [facility]).  Ordered wheel chair with Zack with [place]   Expected Discharge Plan: [place] Barriers to Discharge: Continued Medical Work up  Expected Discharge Plan and Services Expected Discharge Plan: [place]   Discharge Planning Services: CM Consult Post Acute Care Choice: [place] Living arrangements for the past 2 months: Single Family Home                 DME Arranged: N/A DME Agency: NA       HH Arranged: NA           Social Determinants of Health (SDOH) Interventions    Readmission Risk Interventions No flowsheet data found.

## 2020-02-06 NOTE — Evaluation (Signed)
Clinical/Bedside Swallow Evaluation Patient Details  Name: Ronald Dyer MRN: [id number] Date of Birth: 11/04/1940  Today's Date: 02/06/2020 Time: SLP Start Time (ACUTE ONLY): 0745 SLP Stop Time (ACUTE ONLY): 0757 SLP Time Calculation (min) (ACUTE ONLY): 12 min  Past Medical History:  Past Medical History:  Diagnosis Date  . Atherosclerosis of aorta (Ronald Dyer)   . CKD (chronic kidney disease), stage III   . COPD (chronic obstructive pulmonary disease) (Ronald Dyer)   . Depression   . Diabetes mellitus without complication (Ronald Dyer)   . Diabetic retinopathy (Ronald Dyer)   . Frequent falls   . GERD (gastroesophageal reflux disease)   . History of rhabdomyolysis   . Hypercholesterolemia   . Hypertension   . Memory loss   . 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 Stroke [facility])    Past Surgical History:  Past Surgical History:  Procedure Laterality Date  . cataracts Bilateral 2011  . HEMICOLECTOMY Right 03/05/2009  . UMBILICAL HERNIA REPAIR  03/05/2009   HPI:  79 yo male with h/o colon cancer, dementia, CVA with aphasia and right HP, significant pna and empyema in 2001 requiring surgery, COPD now admitted with syncope. Chest CT showed lower lobe ground glass opacities and atelectasis - nothing acute. Swallow eval ordered.   Assessment / Plan / Recommendation Clinical Impression  Patient presents with functional oropharygneal swallow based on clinical swallow evaluation.  He did not pass 3 ounce Yale water test due to requiring break but no indication of aspiration.  H/O CVA results in mild dysarthria and lingual deviation to right upon protrusion.  Strong voice and cough noted.  Pt has few dentition and states he pushes food posterior to his molars to allow mastication - which is functional.  Consumption of banana observed with no oral retention.     He admits some reflux prior to admission - and states he takes Pepto=Bismol for it, denies PPI  use prior to admit. Advised him to speak to MD regarding these concerns.  Educated pt to general aspiration/esophageal precautions.    Recommend continue diet as tolerated, no SlP follow up indicted.  Thanks for the consult.  SLP Visit Diagnosis: Dysphagia, unspecified (R13.10)    Aspiration Risk  Mild aspiration risk    Diet Recommendation Regular;Thin liquid   Liquid Administration via: Cup;Straw Medication Administration: Whole meds with liquid Supervision: Patient able to self feed Compensations: Slow rate;Small sips/bites Postural Changes: Seated upright at 90 degrees;Remain upright for at least 30 minutes after po intake    Other  Recommendations Oral Care Recommendations: Oral care BID   Follow up Recommendations None      Frequency and Duration     n/a       Prognosis   n/a     Swallow Study   General Date of Onset: 02/06/20 HPI: 79 yo male with h/o colon cancer, dementia, CVA with aphasia and right HP, significant pna and empyema in 2001 requiring surgery, COPD now admitted with syncope. Chest CT showed lower lobe ground glass opacities and atelectasis - nothing acute. Swallow eval ordered. Type of Study: Bedside Swallow Evaluation Diet Prior to this Study: Regular;Thin liquids Temperature Spikes Noted: No Respiratory Status: Room air History of Recent Intubation: No Behavior/Cognition: Alert;Cooperative;Pleasant mood Oral Cavity Assessment: Within Functional Limits Oral Care Completed by SLP: No Oral Cavity - Dentition: Missing dentition (few upper and lower dentition only) Vision: Functional for self-feeding Self-Feeding Abilities: Able to feed  self Patient Positioning: Upright in bed Baseline Vocal Quality: Normal (pt does demonstrates dyspnea with prolonged talking) Volitional Cough: Strong Volitional Swallow: Able to elicit    Oral/Motor/Sensory Function Overall Oral Motor/Sensory Function: Mild impairment Facial ROM: Within Functional Limits Facial  Symmetry: Within Functional Limits Facial Strength: Within Functional Limits Facial Sensation: Within Functional Limits Lingual ROM: Within Functional Limits Lingual Symmetry: Within Functional Limits Lingual Strength: Reduced;Suspected CN XII (hypoglossal) dysfunction (deviates to the right) Velum: Within Functional Limits Mandible: Within Functional Limits   Ice Chips Ice chips: Not tested   Thin Liquid Thin Liquid: Within functional limits Presentation: Cup Other Comments: 3 ounce Yale test was not successful as pt required rest break during intake    Nectar Thick Nectar Thick Liquid: Not tested   Honey Thick Honey Thick Liquid: Not tested   Puree Puree: Not tested   Solid     Solid: Within functional limits Presentation: Self Fed Other Comments: for poor dentition      Ronald Dyer 02/06/2020,8:12 AM  Ronald Lime, MS [place] Office [phone]

## 2020-02-06 NOTE — Plan of Care (Signed)
  Problem: Education: Goal: Knowledge of General Education information will improve Description: Including pain rating scale, medication(s)/side effects and non-pharmacologic comfort measures 02/06/2020 1508 by Joni Reining, RN Outcome: Adequate for Discharge 02/06/2020 1409 by Joni Reining, RN Outcome: Progressing   Problem: Health Behavior/Discharge Planning: Goal: Ability to manage health-related needs will improve 02/06/2020 1508 by Joni Reining, RN Outcome: Adequate for Discharge 02/06/2020 1409 by Joni Reining, RN Outcome: Progressing   Problem: Clinical Measurements: Goal: Ability to maintain clinical measurements within normal limits will improve Outcome: Adequate for Discharge Goal: Will remain free from infection Outcome: Adequate for Discharge Goal: Diagnostic test results will improve Outcome: Adequate for Discharge Goal: Respiratory complications will improve Outcome: Adequate for Discharge Goal: Cardiovascular complication will be avoided Outcome: Adequate for Discharge   Problem: Activity: Goal: Risk for activity intolerance will decrease 02/06/2020 1508 by Joni Reining, RN Outcome: Adequate for Discharge 02/06/2020 1409 by Joni Reining, RN Outcome: Progressing   Problem: Nutrition: Goal: Adequate nutrition will be maintained 02/06/2020 1508 by Joni Reining, RN Outcome: Adequate for Discharge 02/06/2020 1409 by Joni Reining, RN Outcome: Progressing   Problem: Coping: Goal: Level of anxiety will decrease Outcome: Adequate for Discharge   Problem: Elimination: Goal: Will not experience complications related to bowel motility Outcome: Adequate for Discharge Goal: Will not experience complications related to urinary retention Outcome: Adequate for Discharge   Problem: Pain Managment: Goal: General experience of comfort will improve Outcome: Adequate for Discharge   Problem: Safety: Goal: Ability to remain free from injury will  improve Outcome: Adequate for Discharge   Problem: Skin Integrity: Goal: Risk for impaired skin integrity will decrease Outcome: Adequate for Discharge

## 2020-02-06 NOTE — Progress Notes (Signed)
Nsg Discharge Note  Admit Date:  02/03/2020 Discharge date: 02/06/2020   KAISEI GILBO to be D/C'd Home per MD order.  AVS completed.   Patient/caregiver able to verbalize understanding.  Discharge Medication: Allergies as of 02/06/2020      Reactions   Simvastatin    High CPK       Medication List    STOP taking these medications   hydrochlorothiazide 25 MG tablet Commonly known as: HYDRODIURIL   lidocaine 5 % Commonly known as: Lidoderm   lisinopril 40 MG tablet Commonly known as: ZESTRIL     TAKE these medications   aspirin 81 MG tablet Take 81 mg by mouth daily. Notes to patient: Tomorrow, 02/07/20.   atorvastatin 10 MG tablet Commonly known as: LIPITOR Take 10 mg by mouth 2 (two) times a week. Tuesdays & Thursdays. Notes to patient: Resume home schedule.   donepezil 10 MG tablet Commonly known as: ARICEPT TAKE 1 TABLET BY MOUTH EVERYDAY AT BEDTIME Notes to patient: Tonight, 02/06/20.   gabapentin 100 MG capsule Commonly known as: NEURONTIN Take 100 mg by mouth daily. Notes to patient: Tomorrow, 02/07/20.   glimepiride 4 MG tablet Commonly known as: AMARYL Take 8 mg by mouth daily. Notes to patient: Tomorrow, 02/07/20.   multivitamin with minerals tablet Take 1 tablet by mouth daily. Notes to patient: Tomorrow, 02/07/20.   pantoprazole 40 MG tablet Commonly known as: PROTONIX Take 1 tablet (40 mg total) by mouth daily at 6 (six) AM. Start taking on: February 07, 2020 Notes to patient: Tomorrow, 02/07/20.   pioglitazone 15 MG tablet Commonly known as: ACTOS Take 15 mg by mouth daily. Notes to patient: Tomorrow, 02/07/20.   sertraline 100 MG tablet Commonly known as: ZOLOFT Take 100 mg by mouth daily. Notes to patient: Tomorrow, 02/07/20.   tamsulosin 0.4 MG Caps capsule Commonly known as: FLOMAX Take 0.4 mg by mouth daily. Take for 1 month for enlargement of the prostate Notes to patient: Tomorrow, 02/07/20.   vitamin B-12 100 MCG tablet Commonly  known as: CYANOCOBALAMIN Take 50 mcg by mouth daily. Notes to patient: Tomorrow, 02/07/20.            Durable Medical Equipment  (From admission, onward)         Start     Ordered   02/05/20 1518  For home use only DME lightweight manual wheelchair with seat cushion  Once       Comments: Patient suffers from syncopal episodes which impairs their ability to perform daily activities like ambulating  in the home.  A cane will not resolve  issue with performing activities of daily living. A wheelchair will allow patient to safely perform daily activities. Patient is not able to propel themselves in the home using a standard weight wheelchair due to syncopal episodes. Patient can self propel in the lightweight wheelchair. Length of need lifetime . Accessories: elevating leg rests (ELRs), wheel locks, extensions and anti-tippers.  Seat and back cushions   02/05/20 1518          Discharge Assessment: Vitals:   02/06/20 0543 02/06/20 1244  BP: (!) 162/77 119/64  Pulse: 61 (!) 53  Resp: 18 18  Temp: 97.9 F (36.6 C) 98.6 F (37 C)  SpO2: 96% 96%   Skin clean, dry and intact without evidence of skin break down, no evidence of skin tears noted. IV catheter discontinued intact. Site without signs and symptoms of complications - no redness or edema noted at insertion site, patient  denies c/o pain - only slight tenderness at site.  Dressing with slight pressure applied.  D/c Instructions-Education: Discharge instructions given to patient/family with verbalized understanding. D/c education completed with patient/family including follow up instructions, medication list, d/c activities limitations if indicated, with other d/c instructions as indicated by MD - patient able to verbalize understanding, all questions fully answered. Patient instructed to return to ED, call 911, or call MD for any changes in condition.  Patient escorted via [place], and D/C home via private auto.  Joni Reining,  RN 02/06/2020 3:09 PM

## 2020-02-06 NOTE — Discharge Instructions (Signed)
     Syncope Syncope is when you pass out (faint) for a short time. It is caused by a sudden decrease in blood flow to the brain. Signs that you may be about to pass out include:  Feeling dizzy or light-headed.  Feeling sick to your stomach (nauseous).  Seeing all white or all black.  Having cold, clammy skin. If you pass out, get help right away. Call your local emergency services (911 in the [place] Do not drive yourself to the hospital. Follow these instructions at home: Watch for any changes in your symptoms. Take these actions to stay safe and help with your symptoms: Lifestyle  Do not drive, use machinery, or play sports until your doctor says it is okay.  Do not drink alcohol.  Do not use any products that contain nicotine or tobacco, such as cigarettes and e-cigarettes. If you need help quitting, ask your doctor.  Drink enough fluid to keep your pee (urine) pale yellow. General instructions  Take over-the-counter and prescription medicines only as told by your doctor.  If you are taking blood pressure or heart medicine, sit up and stand up slowly. Spend a few minutes getting ready to sit and then stand. This can help you feel less dizzy.  Have someone stay with you until you feel stable.  If you start to feel like you might pass out, lie down right away and raise (elevate) your feet above the level of your heart. Breathe deeply and steadily. Wait until all of the symptoms are gone.  Keep all follow-up visits as told by your doctor. This is important. Get help right away if:  You have a very bad headache.  You pass out once or more than once.  You have pain in your chest, belly, or back.  You have a very fast or uneven heartbeat (palpitations).  It hurts to breathe.  You are bleeding from your mouth or your bottom (rectum).  You have black or tarry poop (stool).  You have jerky movements that you cannot control (seizure).  You are confused.  You have  trouble walking.  You are very weak.  You have vision problems. These symptoms may be an emergency. Do not wait to see if the symptoms will go away. Get medical help right away. Call your local emergency services (911 in the [place] Do not drive yourself to the hospital. Summary  Syncope is when you pass out (faint) for a short time. It is caused by a sudden decrease in blood flow to the brain.  Signs that you may be about to faint include feeling dizzy, light-headed, or sick to your stomach, seeing all white or all black, or having cold, clammy skin.  If you start to feel like you might pass out, lie down right away and raise (elevate) your feet above the level of your heart. Breathe deeply and steadily. Wait until all of the symptoms are gone. This information is not intended to replace advice given to you by your health care provider. Make sure you discuss any questions you have with your health care provider. Document Revised: 09/19/2017 Document Reviewed: 09/19/2017 Elsevier Patient Education  2020 [employer].

## 2020-02-06 NOTE — Plan of Care (Signed)
°  Problem: Education: Goal: Knowledge of General Education information will improve Description: Including pain rating scale, medication(s)/side effects and non-pharmacologic comfort measures Outcome: Progressing   Problem: Health Behavior/Discharge Planning: Goal: Ability to manage health-related needs will improve Outcome: Progressing   Problem: Activity: Goal: Risk for activity intolerance will decrease Outcome: Progressing  Discussed with spouse patient requires more assistance with adl's, orthostatics assessed, patient denies sob or dizziness.  Problem: Nutrition: Goal: Adequate nutrition will be maintained Outcome: Progressing  Patient tolerates >50% of meal.s

## 2020-02-06 NOTE — Progress Notes (Signed)
Carotid duplex       has been completed. Preliminary results can be found under CV proc through chart review. Arlis Everly, BS, RDMS, RVT   

## 2020-02-09 ENCOUNTER — Other Ambulatory Visit: Payer: Self-pay

## 2020-02-09 NOTE — Patient Outreach (Signed)
[facility]) Care Management  02/09/2020  Ronald Dyer [facility] 16-Jan-1941 [id number]   Telephone Assessment   Outreach attempt to patient. Spoke briefly with patient-very difficult to understand patient due to speech. He was abel to voice that he is doing an feeling fine. He voices that his wife went to the grocery store and is not at home at present. Advised patient tot have spouse call RN CM back. He voiced understanding.       Plan: RN CM will follow up with spouse/patient within a week if no return call from them.   Enzo Montgomery, RN,BSN,CCM [place] Management Telephonic Care Management Coordinator Direct Phone: [phone] Toll Free: [phone] Fax: [phone]

## 2020-02-12 ENCOUNTER — Telehealth: Payer: Self-pay | Admitting: Internal Medicine

## 2020-02-12 DIAGNOSIS — I129 Hypertensive chronic kidney disease with stage 1 through stage 4 chronic kidney disease, or unspecified chronic kidney disease: Secondary | ICD-10-CM | POA: Diagnosis not present

## 2020-02-12 DIAGNOSIS — E11319 Type 2 diabetes mellitus with unspecified diabetic retinopathy without macular edema: Secondary | ICD-10-CM | POA: Diagnosis not present

## 2020-02-12 DIAGNOSIS — E1122 Type 2 diabetes mellitus with diabetic chronic kidney disease: Secondary | ICD-10-CM | POA: Diagnosis not present

## 2020-02-12 DIAGNOSIS — I69320 Aphasia following cerebral infarction: Secondary | ICD-10-CM | POA: Diagnosis not present

## 2020-02-12 DIAGNOSIS — I69398 Other sequelae of cerebral infarction: Secondary | ICD-10-CM | POA: Diagnosis not present

## 2020-02-12 DIAGNOSIS — N183 Chronic kidney disease, stage 3 unspecified: Secondary | ICD-10-CM | POA: Diagnosis not present

## 2020-02-12 DIAGNOSIS — R269 Unspecified abnormalities of gait and mobility: Secondary | ICD-10-CM | POA: Diagnosis not present

## 2020-02-12 DIAGNOSIS — J449 Chronic obstructive pulmonary disease, unspecified: Secondary | ICD-10-CM | POA: Diagnosis not present

## 2020-02-12 DIAGNOSIS — I69351 Hemiplegia and hemiparesis following cerebral infarction affecting right dominant side: Secondary | ICD-10-CM | POA: Diagnosis not present

## 2020-02-12 NOTE — Telephone Encounter (Signed)
Spoke with patient's wife regarding Palliative services and she said that he was already getting services from [facility], PT.  I explained to her the differences between home health and Palliative and answered all her questions and she was then in agreement with starting Palliative services.  I was unable to schedule the appointment at the time that I was on the phone with her because my computer wasn't working properly. I asked her if I could call her back in the AM to schedule the visit and she was okay with this.

## 2020-02-13 ENCOUNTER — Telehealth: Payer: Self-pay | Admitting: Internal Medicine

## 2020-02-13 NOTE — Telephone Encounter (Signed)
Spoke with wife to schedule the Palliative Consult, this was scheduled for 03/04/20 @ 8:30 AM.

## 2020-02-16 ENCOUNTER — Other Ambulatory Visit: Payer: Self-pay

## 2020-02-16 DIAGNOSIS — I10 Essential (primary) hypertension: Secondary | ICD-10-CM | POA: Diagnosis not present

## 2020-02-16 DIAGNOSIS — E1122 Type 2 diabetes mellitus with diabetic chronic kidney disease: Secondary | ICD-10-CM | POA: Diagnosis not present

## 2020-02-16 DIAGNOSIS — Z6831 Body mass index (BMI) 31.0-31.9, adult: Secondary | ICD-10-CM | POA: Diagnosis not present

## 2020-02-16 DIAGNOSIS — N183 Chronic kidney disease, stage 3 unspecified: Secondary | ICD-10-CM | POA: Diagnosis not present

## 2020-02-16 NOTE — Patient Outreach (Signed)
Ronald Dyer [facility]) Care Management  02/16/2020  [place] 29-Oct-1940 [id number]   Telephone Assessment   Outreach attempt to patient/spouse. Line busy after several tries.     Plan: RN CM will make outreach attempt within the month of July.  Enzo Montgomery, RN,BSN,CCM [place] Management Telephonic Care Management Coordinator Direct Phone: [phone] Toll Free: [phone] Fax: [phone]

## 2020-02-17 DIAGNOSIS — E1122 Type 2 diabetes mellitus with diabetic chronic kidney disease: Secondary | ICD-10-CM | POA: Diagnosis not present

## 2020-02-17 DIAGNOSIS — F325 Major depressive disorder, single episode, in full remission: Secondary | ICD-10-CM | POA: Diagnosis not present

## 2020-02-17 DIAGNOSIS — E1165 Type 2 diabetes mellitus with hyperglycemia: Secondary | ICD-10-CM | POA: Diagnosis not present

## 2020-02-17 DIAGNOSIS — E11319 Type 2 diabetes mellitus with unspecified diabetic retinopathy without macular edema: Secondary | ICD-10-CM | POA: Diagnosis not present

## 2020-02-17 DIAGNOSIS — I10 Essential (primary) hypertension: Secondary | ICD-10-CM | POA: Diagnosis not present

## 2020-02-17 DIAGNOSIS — R269 Unspecified abnormalities of gait and mobility: Secondary | ICD-10-CM | POA: Diagnosis not present

## 2020-02-17 DIAGNOSIS — J449 Chronic obstructive pulmonary disease, unspecified: Secondary | ICD-10-CM | POA: Diagnosis not present

## 2020-02-17 DIAGNOSIS — I129 Hypertensive chronic kidney disease with stage 1 through stage 4 chronic kidney disease, or unspecified chronic kidney disease: Secondary | ICD-10-CM | POA: Diagnosis not present

## 2020-02-17 DIAGNOSIS — I69320 Aphasia following cerebral infarction: Secondary | ICD-10-CM | POA: Diagnosis not present

## 2020-02-17 DIAGNOSIS — N183 Chronic kidney disease, stage 3 unspecified: Secondary | ICD-10-CM | POA: Diagnosis not present

## 2020-02-17 DIAGNOSIS — I6350 Cerebral infarction due to unspecified occlusion or stenosis of unspecified cerebral artery: Secondary | ICD-10-CM | POA: Diagnosis not present

## 2020-02-17 DIAGNOSIS — I69351 Hemiplegia and hemiparesis following cerebral infarction affecting right dominant side: Secondary | ICD-10-CM | POA: Diagnosis not present

## 2020-02-17 DIAGNOSIS — F0150 Vascular dementia without behavioral disturbance: Secondary | ICD-10-CM | POA: Diagnosis not present

## 2020-02-17 DIAGNOSIS — I69398 Other sequelae of cerebral infarction: Secondary | ICD-10-CM | POA: Diagnosis not present

## 2020-02-17 DIAGNOSIS — E7800 Pure hypercholesterolemia, unspecified: Secondary | ICD-10-CM | POA: Diagnosis not present

## 2020-02-19 DIAGNOSIS — E1122 Type 2 diabetes mellitus with diabetic chronic kidney disease: Secondary | ICD-10-CM | POA: Diagnosis not present

## 2020-02-19 DIAGNOSIS — I69320 Aphasia following cerebral infarction: Secondary | ICD-10-CM | POA: Diagnosis not present

## 2020-02-19 DIAGNOSIS — E11319 Type 2 diabetes mellitus with unspecified diabetic retinopathy without macular edema: Secondary | ICD-10-CM | POA: Diagnosis not present

## 2020-02-19 DIAGNOSIS — I129 Hypertensive chronic kidney disease with stage 1 through stage 4 chronic kidney disease, or unspecified chronic kidney disease: Secondary | ICD-10-CM | POA: Diagnosis not present

## 2020-02-19 DIAGNOSIS — R269 Unspecified abnormalities of gait and mobility: Secondary | ICD-10-CM | POA: Diagnosis not present

## 2020-02-19 DIAGNOSIS — J449 Chronic obstructive pulmonary disease, unspecified: Secondary | ICD-10-CM | POA: Diagnosis not present

## 2020-02-19 DIAGNOSIS — I69398 Other sequelae of cerebral infarction: Secondary | ICD-10-CM | POA: Diagnosis not present

## 2020-02-19 DIAGNOSIS — N183 Chronic kidney disease, stage 3 unspecified: Secondary | ICD-10-CM | POA: Diagnosis not present

## 2020-02-19 DIAGNOSIS — I69351 Hemiplegia and hemiparesis following cerebral infarction affecting right dominant side: Secondary | ICD-10-CM | POA: Diagnosis not present

## 2020-02-20 DIAGNOSIS — N183 Chronic kidney disease, stage 3 unspecified: Secondary | ICD-10-CM | POA: Diagnosis not present

## 2020-02-20 DIAGNOSIS — R269 Unspecified abnormalities of gait and mobility: Secondary | ICD-10-CM | POA: Diagnosis not present

## 2020-02-20 DIAGNOSIS — I69351 Hemiplegia and hemiparesis following cerebral infarction affecting right dominant side: Secondary | ICD-10-CM | POA: Diagnosis not present

## 2020-02-20 DIAGNOSIS — E11319 Type 2 diabetes mellitus with unspecified diabetic retinopathy without macular edema: Secondary | ICD-10-CM | POA: Diagnosis not present

## 2020-02-20 DIAGNOSIS — I69320 Aphasia following cerebral infarction: Secondary | ICD-10-CM | POA: Diagnosis not present

## 2020-02-20 DIAGNOSIS — I69398 Other sequelae of cerebral infarction: Secondary | ICD-10-CM | POA: Diagnosis not present

## 2020-02-20 DIAGNOSIS — J449 Chronic obstructive pulmonary disease, unspecified: Secondary | ICD-10-CM | POA: Diagnosis not present

## 2020-02-20 DIAGNOSIS — I129 Hypertensive chronic kidney disease with stage 1 through stage 4 chronic kidney disease, or unspecified chronic kidney disease: Secondary | ICD-10-CM | POA: Diagnosis not present

## 2020-02-20 DIAGNOSIS — E1122 Type 2 diabetes mellitus with diabetic chronic kidney disease: Secondary | ICD-10-CM | POA: Diagnosis not present

## 2020-02-24 DIAGNOSIS — I69320 Aphasia following cerebral infarction: Secondary | ICD-10-CM | POA: Diagnosis not present

## 2020-02-24 DIAGNOSIS — I69398 Other sequelae of cerebral infarction: Secondary | ICD-10-CM | POA: Diagnosis not present

## 2020-02-24 DIAGNOSIS — N183 Chronic kidney disease, stage 3 unspecified: Secondary | ICD-10-CM | POA: Diagnosis not present

## 2020-02-24 DIAGNOSIS — E1122 Type 2 diabetes mellitus with diabetic chronic kidney disease: Secondary | ICD-10-CM | POA: Diagnosis not present

## 2020-02-24 DIAGNOSIS — I69351 Hemiplegia and hemiparesis following cerebral infarction affecting right dominant side: Secondary | ICD-10-CM | POA: Diagnosis not present

## 2020-02-24 DIAGNOSIS — I129 Hypertensive chronic kidney disease with stage 1 through stage 4 chronic kidney disease, or unspecified chronic kidney disease: Secondary | ICD-10-CM | POA: Diagnosis not present

## 2020-02-24 DIAGNOSIS — E11319 Type 2 diabetes mellitus with unspecified diabetic retinopathy without macular edema: Secondary | ICD-10-CM | POA: Diagnosis not present

## 2020-02-24 DIAGNOSIS — R269 Unspecified abnormalities of gait and mobility: Secondary | ICD-10-CM | POA: Diagnosis not present

## 2020-02-24 DIAGNOSIS — J449 Chronic obstructive pulmonary disease, unspecified: Secondary | ICD-10-CM | POA: Diagnosis not present

## 2020-02-26 DIAGNOSIS — E11319 Type 2 diabetes mellitus with unspecified diabetic retinopathy without macular edema: Secondary | ICD-10-CM | POA: Diagnosis not present

## 2020-02-26 DIAGNOSIS — R269 Unspecified abnormalities of gait and mobility: Secondary | ICD-10-CM | POA: Diagnosis not present

## 2020-02-26 DIAGNOSIS — E1122 Type 2 diabetes mellitus with diabetic chronic kidney disease: Secondary | ICD-10-CM | POA: Diagnosis not present

## 2020-02-26 DIAGNOSIS — I69398 Other sequelae of cerebral infarction: Secondary | ICD-10-CM | POA: Diagnosis not present

## 2020-02-26 DIAGNOSIS — N183 Chronic kidney disease, stage 3 unspecified: Secondary | ICD-10-CM | POA: Diagnosis not present

## 2020-02-26 DIAGNOSIS — I69320 Aphasia following cerebral infarction: Secondary | ICD-10-CM | POA: Diagnosis not present

## 2020-02-26 DIAGNOSIS — J449 Chronic obstructive pulmonary disease, unspecified: Secondary | ICD-10-CM | POA: Diagnosis not present

## 2020-02-26 DIAGNOSIS — I69351 Hemiplegia and hemiparesis following cerebral infarction affecting right dominant side: Secondary | ICD-10-CM | POA: Diagnosis not present

## 2020-02-26 DIAGNOSIS — I129 Hypertensive chronic kidney disease with stage 1 through stage 4 chronic kidney disease, or unspecified chronic kidney disease: Secondary | ICD-10-CM | POA: Diagnosis not present

## 2020-02-27 DIAGNOSIS — R269 Unspecified abnormalities of gait and mobility: Secondary | ICD-10-CM | POA: Diagnosis not present

## 2020-02-27 DIAGNOSIS — I69320 Aphasia following cerebral infarction: Secondary | ICD-10-CM | POA: Diagnosis not present

## 2020-02-27 DIAGNOSIS — I129 Hypertensive chronic kidney disease with stage 1 through stage 4 chronic kidney disease, or unspecified chronic kidney disease: Secondary | ICD-10-CM | POA: Diagnosis not present

## 2020-02-27 DIAGNOSIS — I69351 Hemiplegia and hemiparesis following cerebral infarction affecting right dominant side: Secondary | ICD-10-CM | POA: Diagnosis not present

## 2020-02-27 DIAGNOSIS — J449 Chronic obstructive pulmonary disease, unspecified: Secondary | ICD-10-CM | POA: Diagnosis not present

## 2020-02-27 DIAGNOSIS — N183 Chronic kidney disease, stage 3 unspecified: Secondary | ICD-10-CM | POA: Diagnosis not present

## 2020-02-27 DIAGNOSIS — I69398 Other sequelae of cerebral infarction: Secondary | ICD-10-CM | POA: Diagnosis not present

## 2020-02-27 DIAGNOSIS — E11319 Type 2 diabetes mellitus with unspecified diabetic retinopathy without macular edema: Secondary | ICD-10-CM | POA: Diagnosis not present

## 2020-02-27 DIAGNOSIS — E1122 Type 2 diabetes mellitus with diabetic chronic kidney disease: Secondary | ICD-10-CM | POA: Diagnosis not present

## 2020-03-02 DIAGNOSIS — N183 Chronic kidney disease, stage 3 unspecified: Secondary | ICD-10-CM | POA: Diagnosis not present

## 2020-03-02 DIAGNOSIS — I69320 Aphasia following cerebral infarction: Secondary | ICD-10-CM | POA: Diagnosis not present

## 2020-03-02 DIAGNOSIS — E11319 Type 2 diabetes mellitus with unspecified diabetic retinopathy without macular edema: Secondary | ICD-10-CM | POA: Diagnosis not present

## 2020-03-02 DIAGNOSIS — E1122 Type 2 diabetes mellitus with diabetic chronic kidney disease: Secondary | ICD-10-CM | POA: Diagnosis not present

## 2020-03-02 DIAGNOSIS — I69398 Other sequelae of cerebral infarction: Secondary | ICD-10-CM | POA: Diagnosis not present

## 2020-03-02 DIAGNOSIS — R269 Unspecified abnormalities of gait and mobility: Secondary | ICD-10-CM | POA: Diagnosis not present

## 2020-03-02 DIAGNOSIS — I69351 Hemiplegia and hemiparesis following cerebral infarction affecting right dominant side: Secondary | ICD-10-CM | POA: Diagnosis not present

## 2020-03-02 DIAGNOSIS — J449 Chronic obstructive pulmonary disease, unspecified: Secondary | ICD-10-CM | POA: Diagnosis not present

## 2020-03-02 DIAGNOSIS — I129 Hypertensive chronic kidney disease with stage 1 through stage 4 chronic kidney disease, or unspecified chronic kidney disease: Secondary | ICD-10-CM | POA: Diagnosis not present

## 2020-03-03 DIAGNOSIS — R269 Unspecified abnormalities of gait and mobility: Secondary | ICD-10-CM | POA: Diagnosis not present

## 2020-03-03 DIAGNOSIS — I69398 Other sequelae of cerebral infarction: Secondary | ICD-10-CM | POA: Diagnosis not present

## 2020-03-03 DIAGNOSIS — E11319 Type 2 diabetes mellitus with unspecified diabetic retinopathy without macular edema: Secondary | ICD-10-CM | POA: Diagnosis not present

## 2020-03-03 DIAGNOSIS — I69320 Aphasia following cerebral infarction: Secondary | ICD-10-CM | POA: Diagnosis not present

## 2020-03-03 DIAGNOSIS — I69351 Hemiplegia and hemiparesis following cerebral infarction affecting right dominant side: Secondary | ICD-10-CM | POA: Diagnosis not present

## 2020-03-03 DIAGNOSIS — J449 Chronic obstructive pulmonary disease, unspecified: Secondary | ICD-10-CM | POA: Diagnosis not present

## 2020-03-03 DIAGNOSIS — E1122 Type 2 diabetes mellitus with diabetic chronic kidney disease: Secondary | ICD-10-CM | POA: Diagnosis not present

## 2020-03-03 DIAGNOSIS — I129 Hypertensive chronic kidney disease with stage 1 through stage 4 chronic kidney disease, or unspecified chronic kidney disease: Secondary | ICD-10-CM | POA: Diagnosis not present

## 2020-03-03 DIAGNOSIS — N183 Chronic kidney disease, stage 3 unspecified: Secondary | ICD-10-CM | POA: Diagnosis not present

## 2020-03-04 ENCOUNTER — Other Ambulatory Visit: Payer: Self-pay

## 2020-03-04 ENCOUNTER — Other Ambulatory Visit: Payer: Medicare HMO | Admitting: Internal Medicine

## 2020-03-04 DIAGNOSIS — I69398 Other sequelae of cerebral infarction: Secondary | ICD-10-CM | POA: Diagnosis not present

## 2020-03-04 DIAGNOSIS — E11319 Type 2 diabetes mellitus with unspecified diabetic retinopathy without macular edema: Secondary | ICD-10-CM | POA: Diagnosis not present

## 2020-03-04 DIAGNOSIS — E1122 Type 2 diabetes mellitus with diabetic chronic kidney disease: Secondary | ICD-10-CM | POA: Diagnosis not present

## 2020-03-04 DIAGNOSIS — Z515 Encounter for palliative care: Secondary | ICD-10-CM | POA: Diagnosis not present

## 2020-03-04 DIAGNOSIS — I129 Hypertensive chronic kidney disease with stage 1 through stage 4 chronic kidney disease, or unspecified chronic kidney disease: Secondary | ICD-10-CM | POA: Diagnosis not present

## 2020-03-04 DIAGNOSIS — N183 Chronic kidney disease, stage 3 unspecified: Secondary | ICD-10-CM | POA: Diagnosis not present

## 2020-03-04 DIAGNOSIS — R269 Unspecified abnormalities of gait and mobility: Secondary | ICD-10-CM | POA: Diagnosis not present

## 2020-03-04 DIAGNOSIS — I69351 Hemiplegia and hemiparesis following cerebral infarction affecting right dominant side: Secondary | ICD-10-CM | POA: Diagnosis not present

## 2020-03-04 DIAGNOSIS — R55 Syncope and collapse: Secondary | ICD-10-CM

## 2020-03-04 DIAGNOSIS — I69320 Aphasia following cerebral infarction: Secondary | ICD-10-CM | POA: Diagnosis not present

## 2020-03-04 DIAGNOSIS — F0150 Vascular dementia without behavioral disturbance: Secondary | ICD-10-CM

## 2020-03-04 DIAGNOSIS — J449 Chronic obstructive pulmonary disease, unspecified: Secondary | ICD-10-CM | POA: Diagnosis not present

## 2020-03-08 NOTE — Progress Notes (Signed)
[facility] Consult Note Telephone: [phone]  Fax: [phone]  PATIENT NAME: Ronald Dyer DOB: 11-18-1940 MRN: [id number]  PRIMARY CARE PROVIDER:   [name], MD  REFERRING PROVIDER:  [name], MD [place],  [place] [address]  RESPONSIBLE PARTY:   Self and wife   Azahel, [place] [phone]         RECOMMENDATIONS and PLAN:  Palliative care encounter Z51.5  1.  Advance care planning:  Explanation of palliative and hospice care. Reviewed goals of care which include improvement of mobility and gait. Avoid re-occurent episodes of syncope and maintain independence of personal and activities around his home.  MOST and DNAR form were reviewed.  Living will was previously completed.  He desires to have CPR attempted, IV fluids and antibiotics if indicated.  He was unable to select additional delegations.  He and his wife will compare living will and MOST for during additional review.  MOST form will be completed during next visit in aprox 5 weeks.  2.  Syncope:  Encouraged guarded movements when exiting bed and standing.  Fall risk prevention encouraged.  Remain well hydrated.  Continue daily measurement and recording of vital signs. Seek emergency treatment as needed.  3. Vascular dementia:  FAST stage 6a.  Wife will assist with personal care, meds and meals as needed.  Simple interactive activities as tolerated.  Continue Aricept 10mg  daily.  I spent 60 minutes providing this consultation,  from 0830 to 0930. More than 50% of the time in this consultation was spent coordinating communication with patient and wife.   HISTORY OF PRESENT ILLNESS:  ANTONIUS Dyer is a 79 y.o. year old male with multiple medical problems including T2DM with CKD, hx of colon cancer, chronic small vessel ischemic disease, multiple remote lacunar infarcts per MRI on 02/03/2020 and vascular dementia.  He was hospitalized from  6/15-6/18 cue to syncope with loss of consciousness.  No reports of recurrent LOC since discharge from hospital.  He is able to perform his own ADLs some assistance with dressing and ambulation.  Currently receiving PT and OT at home.   Palliative Care was asked to help address goals of care.   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   atorvastatin (LIPITOR) 10 MG tablet Take 10 mg by mouth 2 (two) times a week. Tuesdays & Thursdays.   donepezil (ARICEPT) 10 MG tablet TAKE 1 TABLET BY MOUTH EVERYDAY AT BEDTIME   gabapentin (NEURONTIN) 100 MG capsule Take  100 mg by mouth daily.    glimepiride (AMARYL) 4 MG tablet Take 8 mg by mouth daily.    Multiple Vitamins-Minerals (MULTIVITAMIN WITH MINERALS) tablet Take 1 tablet by mouth daily.   pantoprazole (PROTONIX) 40 MG tablet Take 1 tablet (40 mg total) by mouth daily at 6 (six) AM.   pioglitazone (ACTOS) 15 MG tablet Take 15 mg by mouth daily.   sertraline (ZOLOFT) 100 MG tablet Take 100 mg by mouth daily.   tamsulosin (FLOMAX) 0.4 MG CAPS capsule Take 0.4 mg by mouth daily. Take for 1 month for  enlargement of the prostate   vitamin B-12 (CYANOCOBALAMIN) 100 MCG tablet Take 50 mcg by mouth daily.   No facility-administered encounter medications on file as of 03/04/2020.    PHYSICAL EXAM:   General: NAD, chronically ill appearing elderly male sitting upright Cardiovascular: regular rate and rhythm Pulmonary: clear ant fields Abdomen: soft, nontender, + bowel sounds Extremities: trace edema BLE Skin: exposed skin is intact Neurological: Pleasantly confused to date.  Occasional mild expressive aphasia Psych:  Pleasant mood.    Gonzella Lex, NP-C

## 2020-03-09 DIAGNOSIS — I69320 Aphasia following cerebral infarction: Secondary | ICD-10-CM | POA: Diagnosis not present

## 2020-03-09 DIAGNOSIS — E1122 Type 2 diabetes mellitus with diabetic chronic kidney disease: Secondary | ICD-10-CM | POA: Diagnosis not present

## 2020-03-09 DIAGNOSIS — R269 Unspecified abnormalities of gait and mobility: Secondary | ICD-10-CM | POA: Diagnosis not present

## 2020-03-09 DIAGNOSIS — I69398 Other sequelae of cerebral infarction: Secondary | ICD-10-CM | POA: Diagnosis not present

## 2020-03-09 DIAGNOSIS — I129 Hypertensive chronic kidney disease with stage 1 through stage 4 chronic kidney disease, or unspecified chronic kidney disease: Secondary | ICD-10-CM | POA: Diagnosis not present

## 2020-03-09 DIAGNOSIS — E11319 Type 2 diabetes mellitus with unspecified diabetic retinopathy without macular edema: Secondary | ICD-10-CM | POA: Diagnosis not present

## 2020-03-09 DIAGNOSIS — I69351 Hemiplegia and hemiparesis following cerebral infarction affecting right dominant side: Secondary | ICD-10-CM | POA: Diagnosis not present

## 2020-03-09 DIAGNOSIS — N183 Chronic kidney disease, stage 3 unspecified: Secondary | ICD-10-CM | POA: Diagnosis not present

## 2020-03-09 DIAGNOSIS — J449 Chronic obstructive pulmonary disease, unspecified: Secondary | ICD-10-CM | POA: Diagnosis not present

## 2020-03-10 DIAGNOSIS — I69351 Hemiplegia and hemiparesis following cerebral infarction affecting right dominant side: Secondary | ICD-10-CM | POA: Diagnosis not present

## 2020-03-10 DIAGNOSIS — I69398 Other sequelae of cerebral infarction: Secondary | ICD-10-CM | POA: Diagnosis not present

## 2020-03-10 DIAGNOSIS — I129 Hypertensive chronic kidney disease with stage 1 through stage 4 chronic kidney disease, or unspecified chronic kidney disease: Secondary | ICD-10-CM | POA: Diagnosis not present

## 2020-03-10 DIAGNOSIS — I69320 Aphasia following cerebral infarction: Secondary | ICD-10-CM | POA: Diagnosis not present

## 2020-03-10 DIAGNOSIS — N183 Chronic kidney disease, stage 3 unspecified: Secondary | ICD-10-CM | POA: Diagnosis not present

## 2020-03-10 DIAGNOSIS — E1122 Type 2 diabetes mellitus with diabetic chronic kidney disease: Secondary | ICD-10-CM | POA: Diagnosis not present

## 2020-03-10 DIAGNOSIS — J449 Chronic obstructive pulmonary disease, unspecified: Secondary | ICD-10-CM | POA: Diagnosis not present

## 2020-03-10 DIAGNOSIS — E11319 Type 2 diabetes mellitus with unspecified diabetic retinopathy without macular edema: Secondary | ICD-10-CM | POA: Diagnosis not present

## 2020-03-10 DIAGNOSIS — R269 Unspecified abnormalities of gait and mobility: Secondary | ICD-10-CM | POA: Diagnosis not present

## 2020-03-11 DIAGNOSIS — N183 Chronic kidney disease, stage 3 unspecified: Secondary | ICD-10-CM | POA: Diagnosis not present

## 2020-03-11 DIAGNOSIS — I69398 Other sequelae of cerebral infarction: Secondary | ICD-10-CM | POA: Diagnosis not present

## 2020-03-11 DIAGNOSIS — I129 Hypertensive chronic kidney disease with stage 1 through stage 4 chronic kidney disease, or unspecified chronic kidney disease: Secondary | ICD-10-CM | POA: Diagnosis not present

## 2020-03-11 DIAGNOSIS — R269 Unspecified abnormalities of gait and mobility: Secondary | ICD-10-CM | POA: Diagnosis not present

## 2020-03-11 DIAGNOSIS — E11319 Type 2 diabetes mellitus with unspecified diabetic retinopathy without macular edema: Secondary | ICD-10-CM | POA: Diagnosis not present

## 2020-03-11 DIAGNOSIS — I69320 Aphasia following cerebral infarction: Secondary | ICD-10-CM | POA: Diagnosis not present

## 2020-03-11 DIAGNOSIS — I69351 Hemiplegia and hemiparesis following cerebral infarction affecting right dominant side: Secondary | ICD-10-CM | POA: Diagnosis not present

## 2020-03-11 DIAGNOSIS — E1122 Type 2 diabetes mellitus with diabetic chronic kidney disease: Secondary | ICD-10-CM | POA: Diagnosis not present

## 2020-03-11 DIAGNOSIS — J449 Chronic obstructive pulmonary disease, unspecified: Secondary | ICD-10-CM | POA: Diagnosis not present

## 2020-03-13 DIAGNOSIS — I69320 Aphasia following cerebral infarction: Secondary | ICD-10-CM | POA: Diagnosis not present

## 2020-03-13 DIAGNOSIS — I129 Hypertensive chronic kidney disease with stage 1 through stage 4 chronic kidney disease, or unspecified chronic kidney disease: Secondary | ICD-10-CM | POA: Diagnosis not present

## 2020-03-13 DIAGNOSIS — E1122 Type 2 diabetes mellitus with diabetic chronic kidney disease: Secondary | ICD-10-CM | POA: Diagnosis not present

## 2020-03-13 DIAGNOSIS — N183 Chronic kidney disease, stage 3 unspecified: Secondary | ICD-10-CM | POA: Diagnosis not present

## 2020-03-13 DIAGNOSIS — I69398 Other sequelae of cerebral infarction: Secondary | ICD-10-CM | POA: Diagnosis not present

## 2020-03-13 DIAGNOSIS — R269 Unspecified abnormalities of gait and mobility: Secondary | ICD-10-CM | POA: Diagnosis not present

## 2020-03-13 DIAGNOSIS — E11319 Type 2 diabetes mellitus with unspecified diabetic retinopathy without macular edema: Secondary | ICD-10-CM | POA: Diagnosis not present

## 2020-03-13 DIAGNOSIS — J449 Chronic obstructive pulmonary disease, unspecified: Secondary | ICD-10-CM | POA: Diagnosis not present

## 2020-03-13 DIAGNOSIS — I69351 Hemiplegia and hemiparesis following cerebral infarction affecting right dominant side: Secondary | ICD-10-CM | POA: Diagnosis not present

## 2020-03-15 DIAGNOSIS — N183 Chronic kidney disease, stage 3 unspecified: Secondary | ICD-10-CM | POA: Diagnosis not present

## 2020-03-15 DIAGNOSIS — I69351 Hemiplegia and hemiparesis following cerebral infarction affecting right dominant side: Secondary | ICD-10-CM | POA: Diagnosis not present

## 2020-03-15 DIAGNOSIS — I129 Hypertensive chronic kidney disease with stage 1 through stage 4 chronic kidney disease, or unspecified chronic kidney disease: Secondary | ICD-10-CM | POA: Diagnosis not present

## 2020-03-15 DIAGNOSIS — E11319 Type 2 diabetes mellitus with unspecified diabetic retinopathy without macular edema: Secondary | ICD-10-CM | POA: Diagnosis not present

## 2020-03-15 DIAGNOSIS — R269 Unspecified abnormalities of gait and mobility: Secondary | ICD-10-CM | POA: Diagnosis not present

## 2020-03-15 DIAGNOSIS — I69398 Other sequelae of cerebral infarction: Secondary | ICD-10-CM | POA: Diagnosis not present

## 2020-03-15 DIAGNOSIS — J449 Chronic obstructive pulmonary disease, unspecified: Secondary | ICD-10-CM | POA: Diagnosis not present

## 2020-03-15 DIAGNOSIS — I69320 Aphasia following cerebral infarction: Secondary | ICD-10-CM | POA: Diagnosis not present

## 2020-03-15 DIAGNOSIS — E1122 Type 2 diabetes mellitus with diabetic chronic kidney disease: Secondary | ICD-10-CM | POA: Diagnosis not present

## 2020-03-17 ENCOUNTER — Other Ambulatory Visit: Payer: Self-pay

## 2020-03-17 DIAGNOSIS — J449 Chronic obstructive pulmonary disease, unspecified: Secondary | ICD-10-CM | POA: Diagnosis not present

## 2020-03-17 DIAGNOSIS — E1122 Type 2 diabetes mellitus with diabetic chronic kidney disease: Secondary | ICD-10-CM | POA: Diagnosis not present

## 2020-03-17 DIAGNOSIS — I69398 Other sequelae of cerebral infarction: Secondary | ICD-10-CM | POA: Diagnosis not present

## 2020-03-17 DIAGNOSIS — N183 Chronic kidney disease, stage 3 unspecified: Secondary | ICD-10-CM | POA: Diagnosis not present

## 2020-03-17 DIAGNOSIS — E11319 Type 2 diabetes mellitus with unspecified diabetic retinopathy without macular edema: Secondary | ICD-10-CM | POA: Diagnosis not present

## 2020-03-17 DIAGNOSIS — I69320 Aphasia following cerebral infarction: Secondary | ICD-10-CM | POA: Diagnosis not present

## 2020-03-17 DIAGNOSIS — I129 Hypertensive chronic kidney disease with stage 1 through stage 4 chronic kidney disease, or unspecified chronic kidney disease: Secondary | ICD-10-CM | POA: Diagnosis not present

## 2020-03-17 DIAGNOSIS — R269 Unspecified abnormalities of gait and mobility: Secondary | ICD-10-CM | POA: Diagnosis not present

## 2020-03-17 DIAGNOSIS — I69351 Hemiplegia and hemiparesis following cerebral infarction affecting right dominant side: Secondary | ICD-10-CM | POA: Diagnosis not present

## 2020-03-17 NOTE — Patient Outreach (Signed)
[facility]) Care Management  03/17/2020  Jayvier Burgher [facility] [birth year] [id number]   Telephone Assessment     Unsuccessful outreach attempt to patient.    Plan: RN CM will make outreach attempt within the month of Sept.   Ivanell Deshotel Verl Blalock [place] Management Telephonic Care Management Coordinator Direct Phone: [phone] Toll Free: [phone] Fax: [phone]

## 2020-03-18 ENCOUNTER — Other Ambulatory Visit: Payer: Self-pay | Admitting: Neurology

## 2020-03-18 DIAGNOSIS — I69398 Other sequelae of cerebral infarction: Secondary | ICD-10-CM | POA: Diagnosis not present

## 2020-03-18 DIAGNOSIS — E11319 Type 2 diabetes mellitus with unspecified diabetic retinopathy without macular edema: Secondary | ICD-10-CM | POA: Diagnosis not present

## 2020-03-18 DIAGNOSIS — R269 Unspecified abnormalities of gait and mobility: Secondary | ICD-10-CM | POA: Diagnosis not present

## 2020-03-18 DIAGNOSIS — J449 Chronic obstructive pulmonary disease, unspecified: Secondary | ICD-10-CM | POA: Diagnosis not present

## 2020-03-18 DIAGNOSIS — E1122 Type 2 diabetes mellitus with diabetic chronic kidney disease: Secondary | ICD-10-CM | POA: Diagnosis not present

## 2020-03-18 DIAGNOSIS — I129 Hypertensive chronic kidney disease with stage 1 through stage 4 chronic kidney disease, or unspecified chronic kidney disease: Secondary | ICD-10-CM | POA: Diagnosis not present

## 2020-03-18 DIAGNOSIS — I69351 Hemiplegia and hemiparesis following cerebral infarction affecting right dominant side: Secondary | ICD-10-CM | POA: Diagnosis not present

## 2020-03-18 DIAGNOSIS — N183 Chronic kidney disease, stage 3 unspecified: Secondary | ICD-10-CM | POA: Diagnosis not present

## 2020-03-18 DIAGNOSIS — I69320 Aphasia following cerebral infarction: Secondary | ICD-10-CM | POA: Diagnosis not present

## 2020-03-23 DIAGNOSIS — I69351 Hemiplegia and hemiparesis following cerebral infarction affecting right dominant side: Secondary | ICD-10-CM | POA: Diagnosis not present

## 2020-03-23 DIAGNOSIS — E11319 Type 2 diabetes mellitus with unspecified diabetic retinopathy without macular edema: Secondary | ICD-10-CM | POA: Diagnosis not present

## 2020-03-23 DIAGNOSIS — N183 Chronic kidney disease, stage 3 unspecified: Secondary | ICD-10-CM | POA: Diagnosis not present

## 2020-03-23 DIAGNOSIS — J449 Chronic obstructive pulmonary disease, unspecified: Secondary | ICD-10-CM | POA: Diagnosis not present

## 2020-03-23 DIAGNOSIS — I69398 Other sequelae of cerebral infarction: Secondary | ICD-10-CM | POA: Diagnosis not present

## 2020-03-23 DIAGNOSIS — I129 Hypertensive chronic kidney disease with stage 1 through stage 4 chronic kidney disease, or unspecified chronic kidney disease: Secondary | ICD-10-CM | POA: Diagnosis not present

## 2020-03-23 DIAGNOSIS — E1122 Type 2 diabetes mellitus with diabetic chronic kidney disease: Secondary | ICD-10-CM | POA: Diagnosis not present

## 2020-03-23 DIAGNOSIS — I69320 Aphasia following cerebral infarction: Secondary | ICD-10-CM | POA: Diagnosis not present

## 2020-03-23 DIAGNOSIS — R269 Unspecified abnormalities of gait and mobility: Secondary | ICD-10-CM | POA: Diagnosis not present

## 2020-03-30 DIAGNOSIS — R269 Unspecified abnormalities of gait and mobility: Secondary | ICD-10-CM | POA: Diagnosis not present

## 2020-03-30 DIAGNOSIS — N183 Chronic kidney disease, stage 3 unspecified: Secondary | ICD-10-CM | POA: Diagnosis not present

## 2020-03-30 DIAGNOSIS — I69320 Aphasia following cerebral infarction: Secondary | ICD-10-CM | POA: Diagnosis not present

## 2020-03-30 DIAGNOSIS — J449 Chronic obstructive pulmonary disease, unspecified: Secondary | ICD-10-CM | POA: Diagnosis not present

## 2020-03-30 DIAGNOSIS — I69351 Hemiplegia and hemiparesis following cerebral infarction affecting right dominant side: Secondary | ICD-10-CM | POA: Diagnosis not present

## 2020-03-30 DIAGNOSIS — E1122 Type 2 diabetes mellitus with diabetic chronic kidney disease: Secondary | ICD-10-CM | POA: Diagnosis not present

## 2020-03-30 DIAGNOSIS — I129 Hypertensive chronic kidney disease with stage 1 through stage 4 chronic kidney disease, or unspecified chronic kidney disease: Secondary | ICD-10-CM | POA: Diagnosis not present

## 2020-03-30 DIAGNOSIS — I69398 Other sequelae of cerebral infarction: Secondary | ICD-10-CM | POA: Diagnosis not present

## 2020-03-30 DIAGNOSIS — E11319 Type 2 diabetes mellitus with unspecified diabetic retinopathy without macular edema: Secondary | ICD-10-CM | POA: Diagnosis not present

## 2020-04-05 DIAGNOSIS — I69320 Aphasia following cerebral infarction: Secondary | ICD-10-CM | POA: Diagnosis not present

## 2020-04-05 DIAGNOSIS — E1122 Type 2 diabetes mellitus with diabetic chronic kidney disease: Secondary | ICD-10-CM | POA: Diagnosis not present

## 2020-04-05 DIAGNOSIS — I69398 Other sequelae of cerebral infarction: Secondary | ICD-10-CM | POA: Diagnosis not present

## 2020-04-05 DIAGNOSIS — J449 Chronic obstructive pulmonary disease, unspecified: Secondary | ICD-10-CM | POA: Diagnosis not present

## 2020-04-05 DIAGNOSIS — E11319 Type 2 diabetes mellitus with unspecified diabetic retinopathy without macular edema: Secondary | ICD-10-CM | POA: Diagnosis not present

## 2020-04-05 DIAGNOSIS — I129 Hypertensive chronic kidney disease with stage 1 through stage 4 chronic kidney disease, or unspecified chronic kidney disease: Secondary | ICD-10-CM | POA: Diagnosis not present

## 2020-04-05 DIAGNOSIS — N183 Chronic kidney disease, stage 3 unspecified: Secondary | ICD-10-CM | POA: Diagnosis not present

## 2020-04-05 DIAGNOSIS — I69351 Hemiplegia and hemiparesis following cerebral infarction affecting right dominant side: Secondary | ICD-10-CM | POA: Diagnosis not present

## 2020-04-05 DIAGNOSIS — R269 Unspecified abnormalities of gait and mobility: Secondary | ICD-10-CM | POA: Diagnosis not present

## 2020-04-06 DIAGNOSIS — I129 Hypertensive chronic kidney disease with stage 1 through stage 4 chronic kidney disease, or unspecified chronic kidney disease: Secondary | ICD-10-CM | POA: Diagnosis not present

## 2020-04-06 DIAGNOSIS — F0150 Vascular dementia without behavioral disturbance: Secondary | ICD-10-CM | POA: Diagnosis not present

## 2020-04-06 DIAGNOSIS — I6350 Cerebral infarction due to unspecified occlusion or stenosis of unspecified cerebral artery: Secondary | ICD-10-CM | POA: Diagnosis not present

## 2020-04-06 DIAGNOSIS — F325 Major depressive disorder, single episode, in full remission: Secondary | ICD-10-CM | POA: Diagnosis not present

## 2020-04-06 DIAGNOSIS — I10 Essential (primary) hypertension: Secondary | ICD-10-CM | POA: Diagnosis not present

## 2020-04-06 DIAGNOSIS — E1165 Type 2 diabetes mellitus with hyperglycemia: Secondary | ICD-10-CM | POA: Diagnosis not present

## 2020-04-06 DIAGNOSIS — E1122 Type 2 diabetes mellitus with diabetic chronic kidney disease: Secondary | ICD-10-CM | POA: Diagnosis not present

## 2020-04-06 DIAGNOSIS — E7800 Pure hypercholesterolemia, unspecified: Secondary | ICD-10-CM | POA: Diagnosis not present

## 2020-04-06 DIAGNOSIS — E113299 Type 2 diabetes mellitus with mild nonproliferative diabetic retinopathy without macular edema, unspecified eye: Secondary | ICD-10-CM | POA: Diagnosis not present

## 2020-04-07 DIAGNOSIS — I69398 Other sequelae of cerebral infarction: Secondary | ICD-10-CM | POA: Diagnosis not present

## 2020-04-07 DIAGNOSIS — J449 Chronic obstructive pulmonary disease, unspecified: Secondary | ICD-10-CM | POA: Diagnosis not present

## 2020-04-07 DIAGNOSIS — E1122 Type 2 diabetes mellitus with diabetic chronic kidney disease: Secondary | ICD-10-CM | POA: Diagnosis not present

## 2020-04-07 DIAGNOSIS — E11319 Type 2 diabetes mellitus with unspecified diabetic retinopathy without macular edema: Secondary | ICD-10-CM | POA: Diagnosis not present

## 2020-04-07 DIAGNOSIS — I69320 Aphasia following cerebral infarction: Secondary | ICD-10-CM | POA: Diagnosis not present

## 2020-04-07 DIAGNOSIS — I129 Hypertensive chronic kidney disease with stage 1 through stage 4 chronic kidney disease, or unspecified chronic kidney disease: Secondary | ICD-10-CM | POA: Diagnosis not present

## 2020-04-07 DIAGNOSIS — N183 Chronic kidney disease, stage 3 unspecified: Secondary | ICD-10-CM | POA: Diagnosis not present

## 2020-04-07 DIAGNOSIS — I69351 Hemiplegia and hemiparesis following cerebral infarction affecting right dominant side: Secondary | ICD-10-CM | POA: Diagnosis not present

## 2020-04-07 DIAGNOSIS — R269 Unspecified abnormalities of gait and mobility: Secondary | ICD-10-CM | POA: Diagnosis not present

## 2020-04-12 DIAGNOSIS — I69320 Aphasia following cerebral infarction: Secondary | ICD-10-CM | POA: Diagnosis not present

## 2020-04-12 DIAGNOSIS — R269 Unspecified abnormalities of gait and mobility: Secondary | ICD-10-CM | POA: Diagnosis not present

## 2020-04-12 DIAGNOSIS — E1122 Type 2 diabetes mellitus with diabetic chronic kidney disease: Secondary | ICD-10-CM | POA: Diagnosis not present

## 2020-04-12 DIAGNOSIS — I129 Hypertensive chronic kidney disease with stage 1 through stage 4 chronic kidney disease, or unspecified chronic kidney disease: Secondary | ICD-10-CM | POA: Diagnosis not present

## 2020-04-12 DIAGNOSIS — I69398 Other sequelae of cerebral infarction: Secondary | ICD-10-CM | POA: Diagnosis not present

## 2020-04-12 DIAGNOSIS — I69351 Hemiplegia and hemiparesis following cerebral infarction affecting right dominant side: Secondary | ICD-10-CM | POA: Diagnosis not present

## 2020-04-12 DIAGNOSIS — E11319 Type 2 diabetes mellitus with unspecified diabetic retinopathy without macular edema: Secondary | ICD-10-CM | POA: Diagnosis not present

## 2020-04-12 DIAGNOSIS — N183 Chronic kidney disease, stage 3 unspecified: Secondary | ICD-10-CM | POA: Diagnosis not present

## 2020-04-12 DIAGNOSIS — J449 Chronic obstructive pulmonary disease, unspecified: Secondary | ICD-10-CM | POA: Diagnosis not present

## 2020-04-14 DIAGNOSIS — J449 Chronic obstructive pulmonary disease, unspecified: Secondary | ICD-10-CM | POA: Diagnosis not present

## 2020-04-14 DIAGNOSIS — E11319 Type 2 diabetes mellitus with unspecified diabetic retinopathy without macular edema: Secondary | ICD-10-CM | POA: Diagnosis not present

## 2020-04-14 DIAGNOSIS — N183 Chronic kidney disease, stage 3 unspecified: Secondary | ICD-10-CM | POA: Diagnosis not present

## 2020-04-14 DIAGNOSIS — I69320 Aphasia following cerebral infarction: Secondary | ICD-10-CM | POA: Diagnosis not present

## 2020-04-14 DIAGNOSIS — I69398 Other sequelae of cerebral infarction: Secondary | ICD-10-CM | POA: Diagnosis not present

## 2020-04-14 DIAGNOSIS — I69351 Hemiplegia and hemiparesis following cerebral infarction affecting right dominant side: Secondary | ICD-10-CM | POA: Diagnosis not present

## 2020-04-14 DIAGNOSIS — I129 Hypertensive chronic kidney disease with stage 1 through stage 4 chronic kidney disease, or unspecified chronic kidney disease: Secondary | ICD-10-CM | POA: Diagnosis not present

## 2020-04-14 DIAGNOSIS — R269 Unspecified abnormalities of gait and mobility: Secondary | ICD-10-CM | POA: Diagnosis not present

## 2020-04-14 DIAGNOSIS — E1122 Type 2 diabetes mellitus with diabetic chronic kidney disease: Secondary | ICD-10-CM | POA: Diagnosis not present

## 2020-04-21 DIAGNOSIS — J449 Chronic obstructive pulmonary disease, unspecified: Secondary | ICD-10-CM | POA: Diagnosis not present

## 2020-04-21 DIAGNOSIS — I69320 Aphasia following cerebral infarction: Secondary | ICD-10-CM | POA: Diagnosis not present

## 2020-04-21 DIAGNOSIS — I69351 Hemiplegia and hemiparesis following cerebral infarction affecting right dominant side: Secondary | ICD-10-CM | POA: Diagnosis not present

## 2020-04-21 DIAGNOSIS — I69398 Other sequelae of cerebral infarction: Secondary | ICD-10-CM | POA: Diagnosis not present

## 2020-04-21 DIAGNOSIS — N183 Chronic kidney disease, stage 3 unspecified: Secondary | ICD-10-CM | POA: Diagnosis not present

## 2020-04-21 DIAGNOSIS — E11319 Type 2 diabetes mellitus with unspecified diabetic retinopathy without macular edema: Secondary | ICD-10-CM | POA: Diagnosis not present

## 2020-04-21 DIAGNOSIS — R269 Unspecified abnormalities of gait and mobility: Secondary | ICD-10-CM | POA: Diagnosis not present

## 2020-04-21 DIAGNOSIS — E1122 Type 2 diabetes mellitus with diabetic chronic kidney disease: Secondary | ICD-10-CM | POA: Diagnosis not present

## 2020-04-21 DIAGNOSIS — I129 Hypertensive chronic kidney disease with stage 1 through stage 4 chronic kidney disease, or unspecified chronic kidney disease: Secondary | ICD-10-CM | POA: Diagnosis not present

## 2020-05-03 ENCOUNTER — Other Ambulatory Visit: Payer: Self-pay

## 2020-05-03 DIAGNOSIS — R972 Elevated prostate specific antigen [PSA]: Secondary | ICD-10-CM | POA: Diagnosis not present

## 2020-05-03 DIAGNOSIS — R3915 Urgency of urination: Secondary | ICD-10-CM | POA: Diagnosis not present

## 2020-05-03 DIAGNOSIS — R310 Gross hematuria: Secondary | ICD-10-CM | POA: Diagnosis not present

## 2020-05-03 DIAGNOSIS — N401 Enlarged prostate with lower urinary tract symptoms: Secondary | ICD-10-CM | POA: Diagnosis not present

## 2020-05-03 NOTE — Patient Outreach (Signed)
[facility]) Care Management  05/03/2020  Ronald Dyer [facility] [birth year] [id number]   Telephone Assessment   Outreach attempt to patient/spouse. Spoke with spouse who reported she could not talk long as they were preparing to go to urology appt. She reports that patient is doing well. They recently went on vacation out of town and had a good trip. Spouse reports that they are staying busy with several MD appts and home repairs going on. He goes to see PCP on Wed. Patient's appetite remains good. Wgt stable and cbgs WNL per spouse. Last A1C on file 9.8(June 2021). Spouse voices that patient continues to get [facility] services provided by [facility]. She denies patient having any recent falls. She continues to assist patient with ADLs/IADLs. She denies any RN CM needs or concerns at this time.   Medications Reviewed Today    Reviewed by Lewie Loron, RN (Registered Nurse) on 02/04/20 at 2005  Med List Status: Complete  Medication Order Taking? Sig Documenting Provider Last Dose Status Informant  aspirin 81 MG tablet [id number] Yes Take 81 mg by mouth daily. [provider] 02/03/2020 Unknown time Active Multiple Informants           Med Note Broadus John, REGEENA   Tue Feb 03, 2020  4:37 PM)    atorvastatin (LIPITOR) 10 MG tablet [id number] Yes Take 10 mg by mouth 2 (two) times a week. Tuesdays & Thursdays. [provider] 02/03/2020 Unknown time Active Multiple Informants  donepezil (ARICEPT) 10 MG tablet [id number] Yes TAKE 1 TABLET BY MOUTH EVERYDAY AT BEDTIME Dohmeier, Asencion Partridge, MD 02/03/2020 Unknown time Active Multiple Informants  gabapentin (NEURONTIN) 100 MG capsule [id number] Yes Take 100 mg by mouth daily.  [provider] 02/03/2020 Unknown time Active Multiple Informants  glimepiride (AMARYL) 4 MG tablet [id number] Yes Take 8 mg by mouth daily.  [provider] 02/03/2020 Unknown time Active Multiple Informants           Med Note Broadus John, REGEENA    Tue Feb 03, 2020  5:03 PM) As per wife takes 2 tablets in the morning, confirmed with pharmacy  hydrochlorothiazide (HYDRODIURIL) 25 MG tablet [id number] Yes Take 25 mg by mouth daily. [provider] 02/03/2020 Unknown time Active Multiple Informants  lidocaine (LIDODERM) 5 % [id number] No Place 1 patch onto the skin daily. Remove & Discard patch within 12 hours or as directed by MD  Patient not taking: Reported on 02/03/2020   Margarita Mail, PA-C Not Taking Unknown time Consider Medication Status and Discontinue Multiple Informants  lisinopril (ZESTRIL) 40 MG tablet [id number] Yes Take 80 mg by mouth daily.  [provider] 02/03/2020 Unknown time Active Multiple Informants           Med Note Broadus John, REGEENA   Tue Feb 03, 2020  4:50 PM) As per wife patient takes 2 tablets in the morning.  Multiple Vitamins-Minerals (MULTIVITAMIN WITH MINERALS) tablet [id number] Yes Take 1 tablet by mouth daily. [provider] 02/03/2020 Unknown time Active Multiple Informants  pioglitazone (ACTOS) 15 MG tablet [id number] Yes Take 15 mg by mouth daily. [provider] 02/03/2020 Unknown time Active Multiple Informants  sertraline (ZOLOFT) 100 MG tablet [id number] Yes Take 100 mg by mouth daily. [provider] 02/03/2020 Unknown time Active Multiple Informants  tamsulosin (FLOMAX) 0.4 MG CAPS capsule [id number] Yes Take 0.4 mg by mouth daily. Take for 1 month for enlargement of the prostate [provider] 02/03/2020 Unknown time Active Multiple Informants  vitamin B-12 (CYANOCOBALAMIN)  100 MCG tablet [id number] Yes Take 50 mcg by mouth daily. [provider] 02/03/2020 Unknown time Active Multiple Informants          THN CM Care Plan Problem One     Most Recent Value  Care Plan Problem One Knowledge deficit regarding self-management of Diabetes.  Role Documenting the Problem One [place] for Problem One Active  THN Long Term Goal  Patient's wife  will report a lowering/decrease in A1C level from 9.8 over the next 90 days.  THN Long Term Goal Start Date 05/03/20  Interventions for Problem One Long Term Goal RNCM reviewd and assessed cbg monitoring and values, discussed importane of routien A1C checks per MD orders, assessed for med & diet adherence    THN CM Care Plan Problem Two     Most Recent Value  Care Plan Problem Two Patient at high risk for falls.   Role Documenting the Problem Two Care Management Telephonic [place] for Problem Two Active  THN Long Term Goal Patient will sustain no falls within the next 90 days.   THN Long Term Goal Start Date 05/03/20  THN CM Short Term Goal #1  Spouse will report that patient consistently uses assisitve device 100% of the time over the next 60 days.    THN CM Short Term Goal #1 Start Date 05/03/20      Plan: RN CM discussed with patient next outreach within the month of Dec. Patient gave verbal consent and in agreement with RN CM follow up and timeframe. Patient aware that they may contact RN CM sooner for any issues or concerns.   RN CM will send quarterly update to PCP.   Enzo Montgomery, RN,BSN,CCM [place] Management Telephonic Care Management Coordinator Direct Phone: [phone] Toll Free: [phone] Fax: [phone]

## 2020-05-05 DIAGNOSIS — D649 Anemia, unspecified: Secondary | ICD-10-CM | POA: Diagnosis not present

## 2020-05-05 DIAGNOSIS — I129 Hypertensive chronic kidney disease with stage 1 through stage 4 chronic kidney disease, or unspecified chronic kidney disease: Secondary | ICD-10-CM | POA: Diagnosis not present

## 2020-05-05 DIAGNOSIS — N183 Chronic kidney disease, stage 3 unspecified: Secondary | ICD-10-CM | POA: Diagnosis not present

## 2020-05-05 DIAGNOSIS — F325 Major depressive disorder, single episode, in full remission: Secondary | ICD-10-CM | POA: Diagnosis not present

## 2020-05-05 DIAGNOSIS — K219 Gastro-esophageal reflux disease without esophagitis: Secondary | ICD-10-CM | POA: Diagnosis not present

## 2020-05-05 DIAGNOSIS — E1122 Type 2 diabetes mellitus with diabetic chronic kidney disease: Secondary | ICD-10-CM | POA: Diagnosis not present

## 2020-05-05 DIAGNOSIS — Z79899 Other long term (current) drug therapy: Secondary | ICD-10-CM | POA: Diagnosis not present

## 2020-05-05 DIAGNOSIS — F0150 Vascular dementia without behavioral disturbance: Secondary | ICD-10-CM | POA: Diagnosis not present

## 2020-05-05 DIAGNOSIS — E7800 Pure hypercholesterolemia, unspecified: Secondary | ICD-10-CM | POA: Diagnosis not present

## 2020-05-06 DIAGNOSIS — R269 Unspecified abnormalities of gait and mobility: Secondary | ICD-10-CM | POA: Diagnosis not present

## 2020-05-06 DIAGNOSIS — I69351 Hemiplegia and hemiparesis following cerebral infarction affecting right dominant side: Secondary | ICD-10-CM | POA: Diagnosis not present

## 2020-05-06 DIAGNOSIS — I69398 Other sequelae of cerebral infarction: Secondary | ICD-10-CM | POA: Diagnosis not present

## 2020-05-06 DIAGNOSIS — N183 Chronic kidney disease, stage 3 unspecified: Secondary | ICD-10-CM | POA: Diagnosis not present

## 2020-05-06 DIAGNOSIS — E1122 Type 2 diabetes mellitus with diabetic chronic kidney disease: Secondary | ICD-10-CM | POA: Diagnosis not present

## 2020-05-06 DIAGNOSIS — I69320 Aphasia following cerebral infarction: Secondary | ICD-10-CM | POA: Diagnosis not present

## 2020-05-06 DIAGNOSIS — I129 Hypertensive chronic kidney disease with stage 1 through stage 4 chronic kidney disease, or unspecified chronic kidney disease: Secondary | ICD-10-CM | POA: Diagnosis not present

## 2020-05-06 DIAGNOSIS — J449 Chronic obstructive pulmonary disease, unspecified: Secondary | ICD-10-CM | POA: Diagnosis not present

## 2020-05-06 DIAGNOSIS — E11319 Type 2 diabetes mellitus with unspecified diabetic retinopathy without macular edema: Secondary | ICD-10-CM | POA: Diagnosis not present

## 2020-05-07 DIAGNOSIS — I10 Essential (primary) hypertension: Secondary | ICD-10-CM | POA: Diagnosis not present

## 2020-05-07 DIAGNOSIS — E1122 Type 2 diabetes mellitus with diabetic chronic kidney disease: Secondary | ICD-10-CM | POA: Diagnosis not present

## 2020-05-07 DIAGNOSIS — I129 Hypertensive chronic kidney disease with stage 1 through stage 4 chronic kidney disease, or unspecified chronic kidney disease: Secondary | ICD-10-CM | POA: Diagnosis not present

## 2020-05-07 DIAGNOSIS — E113299 Type 2 diabetes mellitus with mild nonproliferative diabetic retinopathy without macular edema, unspecified eye: Secondary | ICD-10-CM | POA: Diagnosis not present

## 2020-05-07 DIAGNOSIS — E7800 Pure hypercholesterolemia, unspecified: Secondary | ICD-10-CM | POA: Diagnosis not present

## 2020-05-07 DIAGNOSIS — E1165 Type 2 diabetes mellitus with hyperglycemia: Secondary | ICD-10-CM | POA: Diagnosis not present

## 2020-05-07 DIAGNOSIS — F325 Major depressive disorder, single episode, in full remission: Secondary | ICD-10-CM | POA: Diagnosis not present

## 2020-05-07 DIAGNOSIS — I6350 Cerebral infarction due to unspecified occlusion or stenosis of unspecified cerebral artery: Secondary | ICD-10-CM | POA: Diagnosis not present

## 2020-05-07 DIAGNOSIS — F0150 Vascular dementia without behavioral disturbance: Secondary | ICD-10-CM | POA: Diagnosis not present

## 2020-05-11 DIAGNOSIS — E119 Type 2 diabetes mellitus without complications: Secondary | ICD-10-CM | POA: Diagnosis not present

## 2020-05-11 DIAGNOSIS — H52203 Unspecified astigmatism, bilateral: Secondary | ICD-10-CM | POA: Diagnosis not present

## 2020-05-11 DIAGNOSIS — Z961 Presence of intraocular lens: Secondary | ICD-10-CM | POA: Diagnosis not present

## 2020-05-11 DIAGNOSIS — H532 Diplopia: Secondary | ICD-10-CM | POA: Diagnosis not present

## 2020-05-12 DIAGNOSIS — J449 Chronic obstructive pulmonary disease, unspecified: Secondary | ICD-10-CM | POA: Diagnosis not present

## 2020-05-12 DIAGNOSIS — I69398 Other sequelae of cerebral infarction: Secondary | ICD-10-CM | POA: Diagnosis not present

## 2020-05-12 DIAGNOSIS — I129 Hypertensive chronic kidney disease with stage 1 through stage 4 chronic kidney disease, or unspecified chronic kidney disease: Secondary | ICD-10-CM | POA: Diagnosis not present

## 2020-05-12 DIAGNOSIS — N183 Chronic kidney disease, stage 3 unspecified: Secondary | ICD-10-CM | POA: Diagnosis not present

## 2020-05-12 DIAGNOSIS — I69320 Aphasia following cerebral infarction: Secondary | ICD-10-CM | POA: Diagnosis not present

## 2020-05-12 DIAGNOSIS — E1122 Type 2 diabetes mellitus with diabetic chronic kidney disease: Secondary | ICD-10-CM | POA: Diagnosis not present

## 2020-05-12 DIAGNOSIS — E11319 Type 2 diabetes mellitus with unspecified diabetic retinopathy without macular edema: Secondary | ICD-10-CM | POA: Diagnosis not present

## 2020-05-12 DIAGNOSIS — I69351 Hemiplegia and hemiparesis following cerebral infarction affecting right dominant side: Secondary | ICD-10-CM | POA: Diagnosis not present

## 2020-05-12 DIAGNOSIS — R269 Unspecified abnormalities of gait and mobility: Secondary | ICD-10-CM | POA: Diagnosis not present

## 2020-05-13 ENCOUNTER — Ambulatory Visit: Payer: Self-pay

## 2020-05-19 DIAGNOSIS — R269 Unspecified abnormalities of gait and mobility: Secondary | ICD-10-CM | POA: Diagnosis not present

## 2020-05-19 DIAGNOSIS — I129 Hypertensive chronic kidney disease with stage 1 through stage 4 chronic kidney disease, or unspecified chronic kidney disease: Secondary | ICD-10-CM | POA: Diagnosis not present

## 2020-05-19 DIAGNOSIS — I69351 Hemiplegia and hemiparesis following cerebral infarction affecting right dominant side: Secondary | ICD-10-CM | POA: Diagnosis not present

## 2020-05-19 DIAGNOSIS — I69398 Other sequelae of cerebral infarction: Secondary | ICD-10-CM | POA: Diagnosis not present

## 2020-05-19 DIAGNOSIS — J449 Chronic obstructive pulmonary disease, unspecified: Secondary | ICD-10-CM | POA: Diagnosis not present

## 2020-05-19 DIAGNOSIS — E11319 Type 2 diabetes mellitus with unspecified diabetic retinopathy without macular edema: Secondary | ICD-10-CM | POA: Diagnosis not present

## 2020-05-19 DIAGNOSIS — E1122 Type 2 diabetes mellitus with diabetic chronic kidney disease: Secondary | ICD-10-CM | POA: Diagnosis not present

## 2020-05-19 DIAGNOSIS — N183 Chronic kidney disease, stage 3 unspecified: Secondary | ICD-10-CM | POA: Diagnosis not present

## 2020-05-19 DIAGNOSIS — I69320 Aphasia following cerebral infarction: Secondary | ICD-10-CM | POA: Diagnosis not present

## 2020-05-26 DIAGNOSIS — J449 Chronic obstructive pulmonary disease, unspecified: Secondary | ICD-10-CM | POA: Diagnosis not present

## 2020-05-26 DIAGNOSIS — N183 Chronic kidney disease, stage 3 unspecified: Secondary | ICD-10-CM | POA: Diagnosis not present

## 2020-05-26 DIAGNOSIS — I69320 Aphasia following cerebral infarction: Secondary | ICD-10-CM | POA: Diagnosis not present

## 2020-05-26 DIAGNOSIS — I69351 Hemiplegia and hemiparesis following cerebral infarction affecting right dominant side: Secondary | ICD-10-CM | POA: Diagnosis not present

## 2020-05-26 DIAGNOSIS — I129 Hypertensive chronic kidney disease with stage 1 through stage 4 chronic kidney disease, or unspecified chronic kidney disease: Secondary | ICD-10-CM | POA: Diagnosis not present

## 2020-05-26 DIAGNOSIS — I69398 Other sequelae of cerebral infarction: Secondary | ICD-10-CM | POA: Diagnosis not present

## 2020-05-26 DIAGNOSIS — E1122 Type 2 diabetes mellitus with diabetic chronic kidney disease: Secondary | ICD-10-CM | POA: Diagnosis not present

## 2020-05-26 DIAGNOSIS — R269 Unspecified abnormalities of gait and mobility: Secondary | ICD-10-CM | POA: Diagnosis not present

## 2020-05-26 DIAGNOSIS — E11319 Type 2 diabetes mellitus with unspecified diabetic retinopathy without macular edema: Secondary | ICD-10-CM | POA: Diagnosis not present

## 2020-06-02 DIAGNOSIS — R269 Unspecified abnormalities of gait and mobility: Secondary | ICD-10-CM | POA: Diagnosis not present

## 2020-06-02 DIAGNOSIS — I69351 Hemiplegia and hemiparesis following cerebral infarction affecting right dominant side: Secondary | ICD-10-CM | POA: Diagnosis not present

## 2020-06-02 DIAGNOSIS — J449 Chronic obstructive pulmonary disease, unspecified: Secondary | ICD-10-CM | POA: Diagnosis not present

## 2020-06-02 DIAGNOSIS — N183 Chronic kidney disease, stage 3 unspecified: Secondary | ICD-10-CM | POA: Diagnosis not present

## 2020-06-02 DIAGNOSIS — I69398 Other sequelae of cerebral infarction: Secondary | ICD-10-CM | POA: Diagnosis not present

## 2020-06-02 DIAGNOSIS — I69320 Aphasia following cerebral infarction: Secondary | ICD-10-CM | POA: Diagnosis not present

## 2020-06-02 DIAGNOSIS — E1122 Type 2 diabetes mellitus with diabetic chronic kidney disease: Secondary | ICD-10-CM | POA: Diagnosis not present

## 2020-06-02 DIAGNOSIS — E11319 Type 2 diabetes mellitus with unspecified diabetic retinopathy without macular edema: Secondary | ICD-10-CM | POA: Diagnosis not present

## 2020-06-02 DIAGNOSIS — I129 Hypertensive chronic kidney disease with stage 1 through stage 4 chronic kidney disease, or unspecified chronic kidney disease: Secondary | ICD-10-CM | POA: Diagnosis not present

## 2020-06-08 ENCOUNTER — Telehealth: Payer: Self-pay | Admitting: Neurology

## 2020-06-08 ENCOUNTER — Other Ambulatory Visit: Payer: Self-pay | Admitting: Neurology

## 2020-06-08 DIAGNOSIS — I6350 Cerebral infarction due to unspecified occlusion or stenosis of unspecified cerebral artery: Secondary | ICD-10-CM | POA: Diagnosis not present

## 2020-06-08 DIAGNOSIS — F325 Major depressive disorder, single episode, in full remission: Secondary | ICD-10-CM | POA: Diagnosis not present

## 2020-06-08 DIAGNOSIS — I129 Hypertensive chronic kidney disease with stage 1 through stage 4 chronic kidney disease, or unspecified chronic kidney disease: Secondary | ICD-10-CM | POA: Diagnosis not present

## 2020-06-08 DIAGNOSIS — N183 Chronic kidney disease, stage 3 unspecified: Secondary | ICD-10-CM | POA: Diagnosis not present

## 2020-06-08 DIAGNOSIS — F0150 Vascular dementia without behavioral disturbance: Secondary | ICD-10-CM | POA: Diagnosis not present

## 2020-06-08 DIAGNOSIS — I10 Essential (primary) hypertension: Secondary | ICD-10-CM | POA: Diagnosis not present

## 2020-06-08 DIAGNOSIS — E7800 Pure hypercholesterolemia, unspecified: Secondary | ICD-10-CM | POA: Diagnosis not present

## 2020-06-08 DIAGNOSIS — N401 Enlarged prostate with lower urinary tract symptoms: Secondary | ICD-10-CM | POA: Diagnosis not present

## 2020-06-08 DIAGNOSIS — E1165 Type 2 diabetes mellitus with hyperglycemia: Secondary | ICD-10-CM | POA: Diagnosis not present

## 2020-06-08 MED ORDER — DONEPEZIL HCL 10 MG PO TABS: ORAL_TABLET | ORAL | 1 refills | Status: DC

## 2020-06-08 NOTE — Telephone Encounter (Signed)
Refill sent for the patient.

## 2020-06-08 NOTE — Telephone Encounter (Signed)
Pt is needing a refill on his donepezil (ARICEPT) 10 MG tablet and have it sent in to the [facility]

## 2020-06-11 DIAGNOSIS — J449 Chronic obstructive pulmonary disease, unspecified: Secondary | ICD-10-CM | POA: Diagnosis not present

## 2020-06-11 DIAGNOSIS — I69320 Aphasia following cerebral infarction: Secondary | ICD-10-CM | POA: Diagnosis not present

## 2020-06-11 DIAGNOSIS — N183 Chronic kidney disease, stage 3 unspecified: Secondary | ICD-10-CM | POA: Diagnosis not present

## 2020-06-11 DIAGNOSIS — E1122 Type 2 diabetes mellitus with diabetic chronic kidney disease: Secondary | ICD-10-CM | POA: Diagnosis not present

## 2020-06-11 DIAGNOSIS — I69351 Hemiplegia and hemiparesis following cerebral infarction affecting right dominant side: Secondary | ICD-10-CM | POA: Diagnosis not present

## 2020-06-11 DIAGNOSIS — I129 Hypertensive chronic kidney disease with stage 1 through stage 4 chronic kidney disease, or unspecified chronic kidney disease: Secondary | ICD-10-CM | POA: Diagnosis not present

## 2020-06-11 DIAGNOSIS — R269 Unspecified abnormalities of gait and mobility: Secondary | ICD-10-CM | POA: Diagnosis not present

## 2020-06-11 DIAGNOSIS — I69398 Other sequelae of cerebral infarction: Secondary | ICD-10-CM | POA: Diagnosis not present

## 2020-06-11 DIAGNOSIS — E11319 Type 2 diabetes mellitus with unspecified diabetic retinopathy without macular edema: Secondary | ICD-10-CM | POA: Diagnosis not present

## 2020-06-14 ENCOUNTER — Other Ambulatory Visit: Payer: Self-pay | Admitting: Ophthalmology

## 2020-06-14 DIAGNOSIS — H532 Diplopia: Secondary | ICD-10-CM

## 2020-06-23 DIAGNOSIS — I69351 Hemiplegia and hemiparesis following cerebral infarction affecting right dominant side: Secondary | ICD-10-CM | POA: Diagnosis not present

## 2020-06-23 DIAGNOSIS — N183 Chronic kidney disease, stage 3 unspecified: Secondary | ICD-10-CM | POA: Diagnosis not present

## 2020-06-23 DIAGNOSIS — I69320 Aphasia following cerebral infarction: Secondary | ICD-10-CM | POA: Diagnosis not present

## 2020-06-23 DIAGNOSIS — R269 Unspecified abnormalities of gait and mobility: Secondary | ICD-10-CM | POA: Diagnosis not present

## 2020-06-23 DIAGNOSIS — I69398 Other sequelae of cerebral infarction: Secondary | ICD-10-CM | POA: Diagnosis not present

## 2020-06-23 DIAGNOSIS — E11319 Type 2 diabetes mellitus with unspecified diabetic retinopathy without macular edema: Secondary | ICD-10-CM | POA: Diagnosis not present

## 2020-06-23 DIAGNOSIS — I129 Hypertensive chronic kidney disease with stage 1 through stage 4 chronic kidney disease, or unspecified chronic kidney disease: Secondary | ICD-10-CM | POA: Diagnosis not present

## 2020-06-23 DIAGNOSIS — E1122 Type 2 diabetes mellitus with diabetic chronic kidney disease: Secondary | ICD-10-CM | POA: Diagnosis not present

## 2020-06-23 DIAGNOSIS — J449 Chronic obstructive pulmonary disease, unspecified: Secondary | ICD-10-CM | POA: Diagnosis not present

## 2020-06-29 DIAGNOSIS — I129 Hypertensive chronic kidney disease with stage 1 through stage 4 chronic kidney disease, or unspecified chronic kidney disease: Secondary | ICD-10-CM | POA: Diagnosis not present

## 2020-06-29 DIAGNOSIS — E11319 Type 2 diabetes mellitus with unspecified diabetic retinopathy without macular edema: Secondary | ICD-10-CM | POA: Diagnosis not present

## 2020-06-29 DIAGNOSIS — E1122 Type 2 diabetes mellitus with diabetic chronic kidney disease: Secondary | ICD-10-CM | POA: Diagnosis not present

## 2020-06-29 DIAGNOSIS — J449 Chronic obstructive pulmonary disease, unspecified: Secondary | ICD-10-CM | POA: Diagnosis not present

## 2020-06-29 DIAGNOSIS — I69320 Aphasia following cerebral infarction: Secondary | ICD-10-CM | POA: Diagnosis not present

## 2020-06-29 DIAGNOSIS — R269 Unspecified abnormalities of gait and mobility: Secondary | ICD-10-CM | POA: Diagnosis not present

## 2020-06-29 DIAGNOSIS — N183 Chronic kidney disease, stage 3 unspecified: Secondary | ICD-10-CM | POA: Diagnosis not present

## 2020-06-29 DIAGNOSIS — I69398 Other sequelae of cerebral infarction: Secondary | ICD-10-CM | POA: Diagnosis not present

## 2020-06-29 DIAGNOSIS — I69351 Hemiplegia and hemiparesis following cerebral infarction affecting right dominant side: Secondary | ICD-10-CM | POA: Diagnosis not present

## 2020-07-04 ENCOUNTER — Ambulatory Visit
Admission: RE | Admit: 2020-07-04 | Discharge: 2020-07-04 | Disposition: A | Discharge status: Home / Self Care | Payer: Medicare HMO | Source: Ambulatory Visit | Attending: Ophthalmology | Admitting: Ophthalmology

## 2020-07-04 DIAGNOSIS — I6381 Other cerebral infarction due to occlusion or stenosis of small artery: Secondary | ICD-10-CM | POA: Diagnosis not present

## 2020-07-04 DIAGNOSIS — H532 Diplopia: Secondary | ICD-10-CM | POA: Diagnosis not present

## 2020-07-04 DIAGNOSIS — I6782 Cerebral ischemia: Secondary | ICD-10-CM | POA: Diagnosis not present

## 2020-07-04 DIAGNOSIS — G319 Degenerative disease of nervous system, unspecified: Secondary | ICD-10-CM | POA: Diagnosis not present

## 2020-07-04 MED ORDER — GADOBENATE DIMEGLUMINE 529 MG/ML IV SOLN
20.0000 mL | Freq: Once | INTRAVENOUS | Status: AC | PRN
  Administered 2020-07-04: 20 mL via INTRAVENOUS

## 2020-07-07 DIAGNOSIS — E1122 Type 2 diabetes mellitus with diabetic chronic kidney disease: Secondary | ICD-10-CM | POA: Diagnosis not present

## 2020-07-07 DIAGNOSIS — I69320 Aphasia following cerebral infarction: Secondary | ICD-10-CM | POA: Diagnosis not present

## 2020-07-07 DIAGNOSIS — N183 Chronic kidney disease, stage 3 unspecified: Secondary | ICD-10-CM | POA: Diagnosis not present

## 2020-07-07 DIAGNOSIS — J449 Chronic obstructive pulmonary disease, unspecified: Secondary | ICD-10-CM | POA: Diagnosis not present

## 2020-07-07 DIAGNOSIS — R269 Unspecified abnormalities of gait and mobility: Secondary | ICD-10-CM | POA: Diagnosis not present

## 2020-07-07 DIAGNOSIS — I69398 Other sequelae of cerebral infarction: Secondary | ICD-10-CM | POA: Diagnosis not present

## 2020-07-07 DIAGNOSIS — E11319 Type 2 diabetes mellitus with unspecified diabetic retinopathy without macular edema: Secondary | ICD-10-CM | POA: Diagnosis not present

## 2020-07-07 DIAGNOSIS — I69351 Hemiplegia and hemiparesis following cerebral infarction affecting right dominant side: Secondary | ICD-10-CM | POA: Diagnosis not present

## 2020-07-07 DIAGNOSIS — I129 Hypertensive chronic kidney disease with stage 1 through stage 4 chronic kidney disease, or unspecified chronic kidney disease: Secondary | ICD-10-CM | POA: Diagnosis not present

## 2020-07-11 DIAGNOSIS — E11319 Type 2 diabetes mellitus with unspecified diabetic retinopathy without macular edema: Secondary | ICD-10-CM | POA: Diagnosis not present

## 2020-07-11 DIAGNOSIS — I129 Hypertensive chronic kidney disease with stage 1 through stage 4 chronic kidney disease, or unspecified chronic kidney disease: Secondary | ICD-10-CM | POA: Diagnosis not present

## 2020-07-11 DIAGNOSIS — E1122 Type 2 diabetes mellitus with diabetic chronic kidney disease: Secondary | ICD-10-CM | POA: Diagnosis not present

## 2020-07-11 DIAGNOSIS — N183 Chronic kidney disease, stage 3 unspecified: Secondary | ICD-10-CM | POA: Diagnosis not present

## 2020-07-11 DIAGNOSIS — I69320 Aphasia following cerebral infarction: Secondary | ICD-10-CM | POA: Diagnosis not present

## 2020-07-11 DIAGNOSIS — J449 Chronic obstructive pulmonary disease, unspecified: Secondary | ICD-10-CM | POA: Diagnosis not present

## 2020-07-11 DIAGNOSIS — R269 Unspecified abnormalities of gait and mobility: Secondary | ICD-10-CM | POA: Diagnosis not present

## 2020-07-11 DIAGNOSIS — I69398 Other sequelae of cerebral infarction: Secondary | ICD-10-CM | POA: Diagnosis not present

## 2020-07-11 DIAGNOSIS — I69351 Hemiplegia and hemiparesis following cerebral infarction affecting right dominant side: Secondary | ICD-10-CM | POA: Diagnosis not present

## 2020-07-13 ENCOUNTER — Other Ambulatory Visit: Payer: Self-pay | Admitting: Physician Assistant

## 2020-07-13 DIAGNOSIS — R634 Abnormal weight loss: Secondary | ICD-10-CM | POA: Diagnosis not present

## 2020-07-13 DIAGNOSIS — R1013 Epigastric pain: Secondary | ICD-10-CM | POA: Diagnosis not present

## 2020-07-14 DIAGNOSIS — R269 Unspecified abnormalities of gait and mobility: Secondary | ICD-10-CM | POA: Diagnosis not present

## 2020-07-14 DIAGNOSIS — I69351 Hemiplegia and hemiparesis following cerebral infarction affecting right dominant side: Secondary | ICD-10-CM | POA: Diagnosis not present

## 2020-07-14 DIAGNOSIS — E1122 Type 2 diabetes mellitus with diabetic chronic kidney disease: Secondary | ICD-10-CM | POA: Diagnosis not present

## 2020-07-14 DIAGNOSIS — J449 Chronic obstructive pulmonary disease, unspecified: Secondary | ICD-10-CM | POA: Diagnosis not present

## 2020-07-14 DIAGNOSIS — E11319 Type 2 diabetes mellitus with unspecified diabetic retinopathy without macular edema: Secondary | ICD-10-CM | POA: Diagnosis not present

## 2020-07-14 DIAGNOSIS — I129 Hypertensive chronic kidney disease with stage 1 through stage 4 chronic kidney disease, or unspecified chronic kidney disease: Secondary | ICD-10-CM | POA: Diagnosis not present

## 2020-07-14 DIAGNOSIS — I69320 Aphasia following cerebral infarction: Secondary | ICD-10-CM | POA: Diagnosis not present

## 2020-07-14 DIAGNOSIS — N183 Chronic kidney disease, stage 3 unspecified: Secondary | ICD-10-CM | POA: Diagnosis not present

## 2020-07-14 DIAGNOSIS — I69398 Other sequelae of cerebral infarction: Secondary | ICD-10-CM | POA: Diagnosis not present

## 2020-07-19 DIAGNOSIS — H532 Diplopia: Secondary | ICD-10-CM | POA: Diagnosis not present

## 2020-07-20 DIAGNOSIS — E7800 Pure hypercholesterolemia, unspecified: Secondary | ICD-10-CM | POA: Diagnosis not present

## 2020-07-20 DIAGNOSIS — K219 Gastro-esophageal reflux disease without esophagitis: Secondary | ICD-10-CM | POA: Diagnosis not present

## 2020-07-20 DIAGNOSIS — E1165 Type 2 diabetes mellitus with hyperglycemia: Secondary | ICD-10-CM | POA: Diagnosis not present

## 2020-07-20 DIAGNOSIS — F325 Major depressive disorder, single episode, in full remission: Secondary | ICD-10-CM | POA: Diagnosis not present

## 2020-07-20 DIAGNOSIS — F0150 Vascular dementia without behavioral disturbance: Secondary | ICD-10-CM | POA: Diagnosis not present

## 2020-07-20 DIAGNOSIS — I6350 Cerebral infarction due to unspecified occlusion or stenosis of unspecified cerebral artery: Secondary | ICD-10-CM | POA: Diagnosis not present

## 2020-07-20 DIAGNOSIS — I129 Hypertensive chronic kidney disease with stage 1 through stage 4 chronic kidney disease, or unspecified chronic kidney disease: Secondary | ICD-10-CM | POA: Diagnosis not present

## 2020-07-20 DIAGNOSIS — I10 Essential (primary) hypertension: Secondary | ICD-10-CM | POA: Diagnosis not present

## 2020-07-20 DIAGNOSIS — E1122 Type 2 diabetes mellitus with diabetic chronic kidney disease: Secondary | ICD-10-CM | POA: Diagnosis not present

## 2020-08-02 ENCOUNTER — Ambulatory Visit
Admission: RE | Admit: 2020-08-02 | Discharge: 2020-08-02 | Disposition: A | Discharge status: Home / Self Care | Payer: Medicare HMO | Source: Ambulatory Visit | Attending: Physician Assistant | Admitting: Physician Assistant

## 2020-08-02 DIAGNOSIS — R109 Unspecified abdominal pain: Secondary | ICD-10-CM | POA: Diagnosis not present

## 2020-08-02 DIAGNOSIS — R1013 Epigastric pain: Secondary | ICD-10-CM

## 2020-08-02 DIAGNOSIS — R634 Abnormal weight loss: Secondary | ICD-10-CM

## 2020-08-02 DIAGNOSIS — K8080 Other cholelithiasis without obstruction: Secondary | ICD-10-CM | POA: Diagnosis not present

## 2020-08-03 ENCOUNTER — Other Ambulatory Visit: Payer: Self-pay

## 2020-08-03 NOTE — Patient Outreach (Addendum)
Ronald Dyer [facility]) Care Management  08/03/2020  Ronald Dyer [facility] Aug 13, 1941 [id number]   Telephone Assessment Quarterly Call Annual Assessment   Outreach attempt to patient/spouse. Spoke with spouse who reports patient is doing well all things considered. She voices that he was having some abd tenderness and went yesterday for MRI. They are awaiting results. She denies patient experiencing any other GI sxs. Appetite remains good. Wgt stable. Blood sugars are ranging in the 140's. Last A1C on file 8.5(Sept 2021). No recent falls. Spouse reports that patient uses cane as needed but able to ambulate around home without it. He is sleeping/napping a lot during the day but rests well at night. She denies any RN CM needs or concerns at this time.     Medications Reviewed Today    Reviewed by Ronald Pedro, RN (Registered Nurse) on 08/03/20 at 1004  Med List Status: <None>  Medication Order Taking? Sig Documenting Provider Last Dose Status Informant  aspirin 81 MG tablet [id number] No Take 81 mg by mouth daily. [provider] 02/03/2020 Unknown time Active Multiple Informants           Med Note Ronald Dyer, Ronald Dyer   Tue Feb 03, 2020  4:37 PM)    atorvastatin (LIPITOR) 10 MG tablet [id number] No Take 10 mg by mouth 2 (two) times a week. Tuesdays & Thursdays. [provider] 02/03/2020 Unknown time Active Multiple Informants  donepezil (ARICEPT) 10 MG tablet [id number]  TAKE 1 TABLET BY MOUTH EVERYDAY AT BEDTIME Dohmeier, Ronald Partridge, MD  Active   gabapentin (NEURONTIN) 100 MG capsule [id number] No Take 100 mg by mouth daily.  [provider] 02/03/2020 Unknown time Active Multiple Informants  glimepiride (AMARYL) 4 MG tablet [id number] No Take 8 mg by mouth daily.  [provider] 02/03/2020 Unknown time Active Multiple Informants           Med Note Ronald Dyer, Ronald Dyer   Tue Feb 03, 2020  5:03 PM) As per wife takes 2 tablets in the morning, confirmed with  pharmacy  Multiple Vitamins-Minerals (MULTIVITAMIN WITH MINERALS) tablet [id number] No Take 1 tablet by mouth daily. [provider] 02/03/2020 Unknown time Active Multiple Informants  pantoprazole (PROTONIX) 40 MG tablet [id number]  Take 1 tablet (40 mg total) by mouth daily at 6 (six) AM. Hosie Poisson, MD  Active   pioglitazone (ACTOS) 15 MG tablet [id number] No Take 15 mg by mouth daily. [provider] 02/03/2020 Unknown time Active Multiple Informants  sertraline (ZOLOFT) 100 MG tablet [id number] No Take 100 mg by mouth daily. [provider] 02/03/2020 Unknown time Active Multiple Informants  tamsulosin (FLOMAX) 0.4 MG CAPS capsule [id number] No Take 0.4 mg by mouth daily. Take for 1 month for enlargement of the prostate [provider] 02/03/2020 Unknown time Active Multiple Informants  vitamin B-12 (CYANOCOBALAMIN) 100 MCG tablet [id number] No Take 50 mcg by mouth daily. [provider] 02/03/2020 Unknown time Active Multiple Informants          Fall Risk  08/03/2020 05/03/2020 12/08/2019 10/02/2019 09/22/2019  Falls in the past year? 1 1 1  0 1  Comment - - - - -  Number falls in past yr: 1 1 1  - 1  Comment - - - - -  Injury with Fall? 1 1 1  - 1  Risk for fall due to : History of fall(s);Impaired balance/gait;Impaired mobility;Impaired vision;Medication side effect History of fall(s);Impaired balance/gait;Impaired mobility;Impaired vision;Mental status change;Medication side effect History of fall(s);Impaired balance/gait;Impaired mobility;Impaired vision;Mental status change;Medication side effect -  History of fall(s);Impaired balance/gait;Impaired mobility;Medication side effect;Mental status change  Follow up Education provided;Falls evaluation completed;Falls prevention discussed Falls evaluation completed;Education provided Falls evaluation completed;Education provided - -   Depression screen [facility] 2/9 09/05/2017 04/20/2017 04/18/2017 07/09/2014  Decreased  Interest 1 1 1  0  Down, Depressed, Hopeless 1 1 1  0  PHQ - 2 Score 2 2 2  0  Altered sleeping 0 1 1 -  Tired, decreased energy 1 1 1  -  Change in appetite 0 0 0 -  Feeling bad or failure about yourself  0 0 0 -  Trouble concentrating 0 0 0 -  Moving slowly or fidgety/restless 0 1 1 -  Suicidal thoughts 0 0 0 -  PHQ-9 Score 3 5 5  -  Difficult doing work/chores Not difficult at all Not difficult at all - -   SDOH Screenings   Alcohol Screen: Not on file  Depression (UTM5-4): Not on file  Financial Resource Strain: Not on file  Food Insecurity: No Food Insecurity  . Worried About Charity fundraiser in the Last Year: Never true  . Ran Out of Food in the Last Year: Never true  Housing: Low Risk   . Last Housing Risk Score: 0  Physical Activity: Not on file  Social Connections: Not on file  Stress: Not on file  Tobacco Use: Medium Risk  . Smoking Tobacco Use: Former Smoker  . Smokeless Tobacco Use: Former Soil scientist Needs: No Transportation Needs  . Lack of Transportation (Medical): No  . Lack of Transportation (Non-Medical): No    Goals Addressed            This Visit's Progress   . Monitor and Manage My Blood Sugar-Diabetes Type 2       Timeframe:  Long-Range Goal Priority:  High Start Date:   08/03/2020                          Expected End Date: 11/07/2020                 Follow Up Date March 2022   - check blood sugar at prescribed times - check blood sugar before and after exercise - check blood sugar if I feel it is too high or too low - take the blood sugar log to all doctor visits    Why is this important?    Checking your blood sugar at home helps to keep it from getting very high or very low.   Writing the results in a diary or log helps the doctor know how to care for you.   Your blood sugar log should have the time, date and the results.   Also, write down the amount of insulin or other medicine that you take.   Other information, like  what you ate, exercise done and how you were feeling, will also be helpful.     Notes:     . Prevent Falls-Dementia       Timeframe:  Long-Range Goal Priority:  High Start Date:  08/03/2020                           Expected End Date:  11/17/2020                     Follow Up Date March 2022   - always use handrails on the stairs - always wear shoes or slippers  with non-slip sole - use a cane or walker    Why is this important?    There may be trouble with balance and getting around. Falls can happen.    Notes:     . Set My Target A1C-Diabetes Type 2       Timeframe:  Long-Range Goal Priority:  High Start Date:  08/03/2020                           Expected End Date:  11/17/2020                     Follow Up Date March 2022   - set target A1C -reporting lowering of A1C from 8.5   Why is this important?    Your target A1C is decided together by you and your doctor.   It is based on several things like your age and other health issues.    Notes:        Plan: RN CM discussed with spouse next outreach within the month of March . Spouse gave verbal consent and in agreement with RN CM follow up and timeframe. Spouse aware that they may contact RN CM sooner for any issues or concerns. RN CM reviewed goals and plan of care with spouse. Spouse in agreement.  RN CM will send quarterly update to PCP.   Ronald Montgomery, RN,BSN,CCM [place] Management Telephonic Care Management Coordinator Direct Phone: [phone] Toll Free: [phone] Fax: [phone]

## 2020-08-11 DIAGNOSIS — K219 Gastro-esophageal reflux disease without esophagitis: Secondary | ICD-10-CM | POA: Diagnosis not present

## 2020-08-11 DIAGNOSIS — E1165 Type 2 diabetes mellitus with hyperglycemia: Secondary | ICD-10-CM | POA: Diagnosis not present

## 2020-08-11 DIAGNOSIS — E7800 Pure hypercholesterolemia, unspecified: Secondary | ICD-10-CM | POA: Diagnosis not present

## 2020-08-11 DIAGNOSIS — I10 Essential (primary) hypertension: Secondary | ICD-10-CM | POA: Diagnosis not present

## 2020-08-11 DIAGNOSIS — E1122 Type 2 diabetes mellitus with diabetic chronic kidney disease: Secondary | ICD-10-CM | POA: Diagnosis not present

## 2020-08-11 DIAGNOSIS — F0150 Vascular dementia without behavioral disturbance: Secondary | ICD-10-CM | POA: Diagnosis not present

## 2020-08-11 DIAGNOSIS — I6350 Cerebral infarction due to unspecified occlusion or stenosis of unspecified cerebral artery: Secondary | ICD-10-CM | POA: Diagnosis not present

## 2020-08-11 DIAGNOSIS — F325 Major depressive disorder, single episode, in full remission: Secondary | ICD-10-CM | POA: Diagnosis not present

## 2020-08-11 DIAGNOSIS — I129 Hypertensive chronic kidney disease with stage 1 through stage 4 chronic kidney disease, or unspecified chronic kidney disease: Secondary | ICD-10-CM | POA: Diagnosis not present

## 2020-09-03 DIAGNOSIS — I10 Essential (primary) hypertension: Secondary | ICD-10-CM | POA: Diagnosis not present

## 2020-09-03 DIAGNOSIS — I129 Hypertensive chronic kidney disease with stage 1 through stage 4 chronic kidney disease, or unspecified chronic kidney disease: Secondary | ICD-10-CM | POA: Diagnosis not present

## 2020-09-03 DIAGNOSIS — I6350 Cerebral infarction due to unspecified occlusion or stenosis of unspecified cerebral artery: Secondary | ICD-10-CM | POA: Diagnosis not present

## 2020-09-03 DIAGNOSIS — F0150 Vascular dementia without behavioral disturbance: Secondary | ICD-10-CM | POA: Diagnosis not present

## 2020-09-03 DIAGNOSIS — E1122 Type 2 diabetes mellitus with diabetic chronic kidney disease: Secondary | ICD-10-CM | POA: Diagnosis not present

## 2020-09-03 DIAGNOSIS — K219 Gastro-esophageal reflux disease without esophagitis: Secondary | ICD-10-CM | POA: Diagnosis not present

## 2020-09-03 DIAGNOSIS — E7800 Pure hypercholesterolemia, unspecified: Secondary | ICD-10-CM | POA: Diagnosis not present

## 2020-09-03 DIAGNOSIS — E1165 Type 2 diabetes mellitus with hyperglycemia: Secondary | ICD-10-CM | POA: Diagnosis not present

## 2020-09-03 DIAGNOSIS — F325 Major depressive disorder, single episode, in full remission: Secondary | ICD-10-CM | POA: Diagnosis not present

## 2020-09-14 ENCOUNTER — Other Ambulatory Visit: Payer: Self-pay | Admitting: Gastroenterology

## 2020-09-14 DIAGNOSIS — R634 Abnormal weight loss: Secondary | ICD-10-CM | POA: Diagnosis not present

## 2020-09-14 DIAGNOSIS — Z85038 Personal history of other malignant neoplasm of large intestine: Secondary | ICD-10-CM | POA: Diagnosis not present

## 2020-09-28 DIAGNOSIS — E1122 Type 2 diabetes mellitus with diabetic chronic kidney disease: Secondary | ICD-10-CM | POA: Diagnosis not present

## 2020-09-28 DIAGNOSIS — R634 Abnormal weight loss: Secondary | ICD-10-CM | POA: Diagnosis not present

## 2020-09-28 DIAGNOSIS — F0150 Vascular dementia without behavioral disturbance: Secondary | ICD-10-CM | POA: Diagnosis not present

## 2020-09-28 DIAGNOSIS — Z7984 Long term (current) use of oral hypoglycemic drugs: Secondary | ICD-10-CM | POA: Diagnosis not present

## 2020-09-28 DIAGNOSIS — Z85038 Personal history of other malignant neoplasm of large intestine: Secondary | ICD-10-CM | POA: Diagnosis not present

## 2020-10-04 ENCOUNTER — Encounter: Payer: Self-pay | Admitting: Adult Health

## 2020-10-04 ENCOUNTER — Ambulatory Visit: Payer: Medicare HMO | Admitting: Adult Health

## 2020-10-04 ENCOUNTER — Other Ambulatory Visit: Payer: Self-pay

## 2020-10-04 VITALS — BP 125/77 | HR 57 | Ht 72.0 in | Wt 205.0 lb

## 2020-10-04 DIAGNOSIS — R269 Unspecified abnormalities of gait and mobility: Secondary | ICD-10-CM

## 2020-10-04 DIAGNOSIS — I69398 Other sequelae of cerebral infarction: Secondary | ICD-10-CM | POA: Diagnosis not present

## 2020-10-04 DIAGNOSIS — F0150 Vascular dementia without behavioral disturbance: Secondary | ICD-10-CM

## 2020-10-04 NOTE — Progress Notes (Signed)
PATIENT: Ronald Dyer DOB: [birth year]  REASON FOR VISIT: follow up HISTORY FROM: patient  HISTORY OF PRESENT ILLNESS: Today 10/04/20: Ronald Dyer is an 80 year old male with a history of vascular dementia.  He returns today for follow-up.  Wife reports that over the last year his memory has worsened.  He requires assistance with all ADLs.  His wife manages his medications.  He is able to feed himself.  Reports that he has a good appetite.  Wife states that he sleeps well.  The patient's gait is unsteady.  For that reason, he does not walk much.  Wife reports that he stays in bed most of the day.  He usually eats his meals in bed.  He does have a cane.  Remains on Aricept at bedtime.  Wife feels that there is some component of depression as well.  He is on Zoloft 100 mg at bedtime   HISTORY  10-02-2019. Patient is here speaking to himself, having asked me frequently about a Sport and exercise psychologist care service" , he rambles on about treatment with needles.  He appears not fully oriented to place and situation.  His wife stated he is on and off confused.   He is always sleeping now, often 12 hors or more. He is not talking much at home, but talking to others his wife can't see. He is vividly dreaming, yelling, jerking, fighting some invisible attacker.  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  His wife serves him food in the bed- he watches TV in bed 24/ 7. There is no incentive for him to get up, and his wife wants him to be protected from falls and getting lost.  By now, there is less frequent falls but this is due to PT, however he can't keep up his mobility with this lifestyle.   REVIEW OF SYSTEMS: Out of a complete 14 system review of symptoms, the patient complains only of the following symptoms, and all other reviewed systems are negative.  See HPI  ALLERGIES: Allergies  Allergen Reactions  . Simvastatin     High CPK     HOME MEDICATIONS: Outpatient  Medications Prior to Visit  Medication Sig Dispense Refill  . aspirin 81 MG tablet Take 81 mg by mouth daily.    Marland Kitchen atorvastatin (LIPITOR) 10 MG tablet Take 10 mg by mouth 2 (two) times a week. Tuesdays & Thursdays.    Marland Kitchen donepezil (ARICEPT) 10 MG tablet TAKE 1 TABLET BY MOUTH EVERYDAY AT BEDTIME 90 tablet 1  . gabapentin (NEURONTIN) 100 MG capsule Take 100 mg by mouth daily.     Marland Kitchen glimepiride (AMARYL) 4 MG tablet Take 8 mg by mouth daily.     . Multiple Vitamins-Minerals (MULTIVITAMIN WITH MINERALS) tablet Take 1 tablet by mouth daily.    . pantoprazole (PROTONIX) 40 MG tablet Take 1 tablet (40 mg total) by mouth daily at 6 (six) AM. 30 tablet 1  . pioglitazone (ACTOS) 15 MG tablet Take 15 mg by mouth daily.    . sertraline (ZOLOFT) 100 MG tablet Take 100 mg by mouth daily.    . tamsulosin (FLOMAX) 0.4 MG CAPS capsule Take 0.4 mg by mouth daily. Take for 1 month for enlargement of the prostate    . vitamin B-12 (CYANOCOBALAMIN) 100 MCG tablet Take 50 mcg by mouth daily.     No facility-administered medications prior to visit.    PAST MEDICAL HISTORY: Past Medical History:  Diagnosis Date  . Atherosclerosis of aorta (  [place])   . CKD (chronic kidney disease), stage III ([place])   . COPD (chronic obstructive pulmonary disease) ([place])   . Depression   . Diabetes mellitus without complication ([place])   . Diabetic retinopathy ([place])   . Frequent falls   . GERD (gastroesophageal reflux disease)   . History of rhabdomyolysis   . Hypercholesterolemia   . Hypertension   . Memory loss   . 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 Stroke [facility])     PAST SURGICAL HISTORY: Past Surgical History:  Procedure Laterality Date  . cataracts Bilateral 2011  . HEMICOLECTOMY Right 03/05/2009  . UMBILICAL HERNIA REPAIR  03/05/2009    FAMILY HISTORY: Family History  Problem Relation Age of Onset  . Cancer Father     SOCIAL HISTORY: Social  History   Socioeconomic History  . Marital status: Married    Spouse name: Not on file  . Number of children: Not on file  . Years of education: Not on file  . Highest education level: Not on file  Occupational History  . Not on file  Tobacco Use  . Smoking status: Former Smoker    Years: 40.00    Quit date: 09/08/1998    Years since quitting: 22.0  . Smokeless tobacco: Former Network engineer  . Vaping Use: Never used  Substance and Sexual Activity  . Alcohol use: No  . Drug use: No  . Sexual activity: Never  Other Topics Concern  . Not on file  Social History Narrative  . Not on file   Social Determinants of Health   Financial Resource Strain: Not on file  Food Insecurity: No Food Insecurity  . Worried About Charity fundraiser in the Last Year: Never true  . Ran Out of Food in the Last Year: Never true  Transportation Needs: No Transportation Needs  . Lack of Transportation (Medical): No  . Lack of Transportation (Non-Medical): No  Physical Activity: Not on file  Stress: Not on file  Social Connections: Not on file  Intimate Partner Violence: Not on file      PHYSICAL EXAM  Vitals:   10/04/20 1010  BP: 125/77  Pulse: (!) 57  Weight: 205 lb (93 kg)  Height: 6' (1.829 m)   Body mass index is 27.8 kg/m.   MMSE - Mini Mental State Exam 10/04/2020 10/02/2019  Orientation to time 0 3  Orientation to Place 3 4  Registration 3 3  Attention/ Calculation 0 5  Recall 2 2  Language- name 2 objects 2 2  Language- repeat 1 1  Language- follow 3 step command 2 3  Language- read & follow direction 0 1  Write a sentence 0 0  Copy design 0 0  Total score 13 24     Generalized: Well developed, in no acute distress   Neurological examination  Mentation: Alert oriented to time, place, history taking. Follows all commands.  Speech is garbled and slurred at times other times it is clear. Cranial nerve II-XII: Pupils were equal round reactive to light. Extraocular  movements were full, visual field were full on confrontational test.  Head turning and shoulder shrug  were normal and symmetric. Motor: The motor testing reveals 5 over 5 strength of all 4 extremities. Good symmetric motor tone is noted throughout.  Sensory: Sensory testing is intact to soft touch on all 4 extremities. No evidence of extinction is  noted.  Coordination: Cerebellar testing reveals good finger-nose-finger and heel-to-shin bilaterally.  Gait and station: Gait is unsteady.  Uses a cane but still very unsteady.  Uses a wheelchair to rolling my the office today. Reflexes: Deep tendon reflexes are symmetric and normal bilaterally.   DIAGNOSTIC DATA (LABS, IMAGING, TESTING) - I reviewed patient records, labs, notes, testing and imaging myself where available.  Lab Results  Component Value Date   WBC 7.2 02/05/2020   HGB 12.4 (L) 02/05/2020   HCT 38.0 (L) 02/05/2020   MCV 79.2 (L) 02/05/2020   PLT 157 02/05/2020      Component Value Date/Time   NA 140 02/06/2020 0827   NA 140 07/01/2014 1000   K 4.0 02/06/2020 0827   K 4.1 07/01/2014 1000   CL 110 02/06/2020 0827   CL 107 12/24/2012 1009   CO2 22 02/06/2020 0827   CO2 25 07/01/2014 1000   GLUCOSE 213 (H) 02/06/2020 0827   GLUCOSE 121 07/01/2014 1000   GLUCOSE 107 (H) 12/24/2012 1009   BUN 13 02/06/2020 0827   BUN 8.4 07/01/2014 1000   CREATININE 1.56 (H) 02/06/2020 0827   CREATININE 1.1 07/01/2014 1000   CALCIUM 9.0 02/06/2020 0827   CALCIUM 10.2 07/01/2014 1000   PROT 7.1 07/01/2014 1000   ALBUMIN 3.9 07/01/2014 1000   AST 28 07/01/2014 1000   ALT 30 07/01/2014 1000   ALKPHOS 58 07/01/2014 1000   BILITOT 0.48 07/01/2014 1000   GFRNONAA 41 (L) 02/06/2020 0827   GFRAA 48 (L) 02/06/2020 0827   No results found for: CHOL, HDL, LDLCALC, LDLDIRECT, TRIG, CHOLHDL Lab Results  Component Value Date   HGBA1C 9.8 (H) 02/04/2020   No results found for: ZOXWRUEA54 Lab Results  Component Value Date   TSH 1.505  02/04/2020      ASSESSMENT AND PLAN 80 y.o. year old male  has a past medical history of Atherosclerosis of aorta ([place]), CKD (chronic kidney disease), stage III ([place]), COPD (chronic obstructive pulmonary disease) ([place]), Depression, Diabetes mellitus without complication ([place]), Diabetic retinopathy ([place]), Frequent falls, GERD (gastroesophageal reflux disease), History of rhabdomyolysis, Hypercholesterolemia, Hypertension, Memory loss, Personal history of other malignant neoplasm of large intestine, and Stroke ([place]). here with:  1.  Dementia-vascular 2.  History of stroke  --Memory score has declined, MMSE 13 out of 30 previously 24 out of 30 --Discussed adding on Namenda however the patient and his wife deferred. --Remain on Aricept 10 mg at bedtime --Gait is unsteady--discussed in order for wheelchair however the wife deferred. --Encouraged regular follow-up with his PCP to manage stroke risk factors --Follow-up in 6 months or sooner if needed   I spent 30 minutes of face-to-face and non-face-to-face time with patient.  This included previsit chart review, lab review, study review, order entry, electronic health record documentation, patient education.  Ward Givens, MSN, NP-C 10/04/2020, 10:17 AM [facility] [address] [place], [place] [address] [phone]

## 2020-10-04 NOTE — Patient Instructions (Signed)
Your Plan:  Continue Aricept  Try to get out of bed more. Engage in conversations  If your symptoms worsen or you develop new symptoms please let us know.    Thank you for coming to see [place] at [facility]. I hope we have been able to provide you high quality care today.  You may receive a patient satisfaction survey over the next few weeks. We would appreciate your feedback and comments so that we may continue to improve ourselves and the health of our patients.

## 2020-10-06 DIAGNOSIS — E1122 Type 2 diabetes mellitus with diabetic chronic kidney disease: Secondary | ICD-10-CM | POA: Diagnosis not present

## 2020-10-06 DIAGNOSIS — Z85038 Personal history of other malignant neoplasm of large intestine: Secondary | ICD-10-CM | POA: Diagnosis not present

## 2020-10-06 DIAGNOSIS — Z0000 Encounter for general adult medical examination without abnormal findings: Secondary | ICD-10-CM | POA: Diagnosis not present

## 2020-10-06 DIAGNOSIS — F0150 Vascular dementia without behavioral disturbance: Secondary | ICD-10-CM | POA: Diagnosis not present

## 2020-10-06 DIAGNOSIS — Z1389 Encounter for screening for other disorder: Secondary | ICD-10-CM | POA: Diagnosis not present

## 2020-10-06 DIAGNOSIS — R634 Abnormal weight loss: Secondary | ICD-10-CM | POA: Diagnosis not present

## 2020-10-08 DIAGNOSIS — F0150 Vascular dementia without behavioral disturbance: Secondary | ICD-10-CM | POA: Diagnosis not present

## 2020-10-08 DIAGNOSIS — E7800 Pure hypercholesterolemia, unspecified: Secondary | ICD-10-CM | POA: Diagnosis not present

## 2020-10-08 DIAGNOSIS — F325 Major depressive disorder, single episode, in full remission: Secondary | ICD-10-CM | POA: Diagnosis not present

## 2020-10-08 DIAGNOSIS — Z85038 Personal history of other malignant neoplasm of large intestine: Secondary | ICD-10-CM | POA: Diagnosis not present

## 2020-10-08 DIAGNOSIS — N183 Chronic kidney disease, stage 3 unspecified: Secondary | ICD-10-CM | POA: Diagnosis not present

## 2020-10-08 DIAGNOSIS — I129 Hypertensive chronic kidney disease with stage 1 through stage 4 chronic kidney disease, or unspecified chronic kidney disease: Secondary | ICD-10-CM | POA: Diagnosis not present

## 2020-10-08 DIAGNOSIS — E1122 Type 2 diabetes mellitus with diabetic chronic kidney disease: Secondary | ICD-10-CM | POA: Diagnosis not present

## 2020-10-08 DIAGNOSIS — N401 Enlarged prostate with lower urinary tract symptoms: Secondary | ICD-10-CM | POA: Diagnosis not present

## 2020-10-08 DIAGNOSIS — K219 Gastro-esophageal reflux disease without esophagitis: Secondary | ICD-10-CM | POA: Diagnosis not present

## 2020-10-21 DIAGNOSIS — R972 Elevated prostate specific antigen [PSA]: Secondary | ICD-10-CM | POA: Diagnosis not present

## 2020-11-01 ENCOUNTER — Other Ambulatory Visit: Payer: Self-pay

## 2020-11-01 NOTE — Patient Outreach (Signed)
[facility]) Care Management  11/01/2020  Jearl Soto [facility] 02-18-1941 [id number]   Telephone Assessment Quarterly Call   Successful outreach call to spouse/caregiver. She reports that patient has ben doing fairly well. She voices that she has noticed some "gradual overall decline" with patient. They saw PCP last month. Patient is scheduled for colonoscopy on 11/09/20. Appetite remains WNL for patient-eating about 2x/day plus snacks. No recent falls. Patient continues to be "prideful" per spouse and does not like to use DME outside of the home. She voices that patient is going lesser places and they are not travelling as much as they used. However, they do have a beach trip planned for May to celebrate her birthday. She denies any RN CM needs or concerns at this time.   Medications Reviewed Today    Reviewed by Hayden Pedro, RN (Registered Nurse) on 11/01/20 at [phone]  Med List Status: <None>  Medication Order Taking? Sig Documenting Provider Last Dose Status Informant  aspirin 81 MG tablet [id number] No Take 81 mg by mouth daily. [provider] Taking Active Spouse/Significant Other           Med Note Maud Deed   Tue Feb 03, 2020  4:37 PM)    atorvastatin (LIPITOR) 10 MG tablet [id number] No Take 10 mg by mouth 2 (two) times a week. Tuesdays & Thursdays. [provider] Taking Active Spouse/Significant Other  donepezil (ARICEPT) 10 MG tablet [id number] No TAKE 1 TABLET BY MOUTH EVERYDAY AT BEDTIME  Patient taking differently: Take 10 mg by mouth at bedtime. TAKE 1 TABLET BY MOUTH EVERYDAY AT BEDTIME   Dohmeier, Asencion Partridge, MD Taking Active Spouse/Significant Other  empagliflozin (JARDIANCE) 25 MG TABS tablet [id number]  Take by mouth daily. [provider]  Active Spouse/Significant Other  glimepiride (AMARYL) 4 MG tablet [id number] No Take 8 mg by mouth daily with breakfast. [provider] Taking Active Spouse/Significant Other            Med Note Wilmon Pali, MELISSA R   Tue Oct 26, 2020  3:18 PM)    hydrochlorothiazide (HYDRODIURIL) 25 MG tablet [id number]  Take 25 mg by mouth every morning. [provider]  Active Spouse/Significant Other  lisinopril (ZESTRIL) 40 MG tablet [id number]  Take 20 mg by mouth daily. [provider]  Active Spouse/Significant Other  Multiple Vitamins-Minerals (MULTIVITAMIN WITH MINERALS) tablet [id number] No Take 1 tablet by mouth daily. [provider] Taking Active Spouse/Significant Other  pantoprazole (PROTONIX) 40 MG tablet [id number] No Take 1 tablet (40 mg total) by mouth daily at 6 (six) AM. Hosie Poisson, MD Taking Active Spouse/Significant Other  pioglitazone (ACTOS) 45 MG tablet [id number] No Take 45 mg by mouth daily. [provider] Taking Active Spouse/Significant Other  sertraline (ZOLOFT) 100 MG tablet [id number] No Take 100 mg by mouth daily. [provider] Taking Active Spouse/Significant Other  tamsulosin (FLOMAX) 0.4 MG CAPS capsule [id number] No Take 0.4 mg by mouth daily. Take for 1 month for enlargement of the prostate [provider] Taking Active Spouse/Significant Other  vitamin B-12 (CYANOCOBALAMIN) 100 MCG tablet [id number] No Take 50 mcg by mouth daily. [provider] Taking Active Spouse/Significant Other          Goals Addressed              This Visit's Progress   .  COMPLETED: (THN)Monitor and Manage My Blood Sugar-Diabetes Type 2 (pt-stated)        Timeframe:  Long-Range Goal Priority:  [employer]  Date:   08/03/2020                          Expected End Date: 11/07/2020                 Follow Up Date March 2022   - check blood sugar at prescribed times - check blood sugar before and after exercise - check blood sugar if I feel it is too high or too low - take the blood sugar log to all doctor visits    Why is this important?    Checking your blood sugar at home helps to keep it from getting  very high or very low.   Writing the results in a diary or log helps the doctor know how to care for you.   Your blood sugar log should have the time, date and the results.   Also, write down the amount of insulin or other medicine that you take.   Other information, like what you ate, exercise done and how you were feeling, will also be helpful.     Notes:  11/01/20-Caregiver has bene adherent to managing and monitoring cbgs in the home. She is knowledgeable regarding diabetic diet.     .  (THN)Prevent Falls-Dementia (pt-stated)        Timeframe:  Long-Range Goal Priority:  High Start Date:  08/03/2020                           Expected End Date:  02/17/2021                     Follow Up Date June 2022   -use DME consistently inside/outside of the home -remove any safety barriers in the home -ensure adequate lighting in the home sp at nights to prevent falls     Why is this important?    There may be trouble with balance and getting around. Falls can happen.    Notes:  11/01/20-Caregiver reports no recent falls. Patient continues to remain "prideful" and does not like to sue DME when around other people.     .  (THN)Set My Target A1C-Diabetes Type 2 (pt-stated)        Timeframe:  Long-Range Goal Priority:  High Start Date:  08/03/2020                           Expected End Date:  02/17/2021                     Follow Up Date June 2022   -caregiver will continue to monitor cbgs in the home as ordered by MD -caregiver will continue to provide nutritious and diabetic appropriate meals to patient -patient will have A1C level tested q70months or as ordered by MD   Why is this important?    Your target A1C is decided together by you and your doctor.   It is based on several things like your age and other health issues.    Notes:  11/01/20-Most recent A1C level 7.4(Feb 2022) down from 8.5. Caregiver voices that appetite is WNL for patient-eating about two meals per day plus  snacks. Wgt 198 lbs.         Plan: RN CM discussed with caregiver next outreach within the month of June. Caregiver gave verbal consent and in agreement with RN CM follow  up and timeframe. Caregiver aware that they may contact RN CM sooner for any issues or concerns. RN CM reviewed goals and plan of care with caregiver. Caregiver agrees to care plan and follow up. RN CM will send quarterly update to PCP.   Enzo Montgomery, RN,BSN,CCM [place] Management Telephonic Care Management Coordinator Direct Phone: [phone] Toll Free: [phone] Fax: [phone]

## 2020-11-02 ENCOUNTER — Other Ambulatory Visit: Payer: Self-pay | Admitting: Gastroenterology

## 2020-11-02 NOTE — Progress Notes (Signed)
Attempted to obtain medical history via telephone, unable to reach at this time.

## 2020-11-05 ENCOUNTER — Other Ambulatory Visit (HOSPITAL_COMMUNITY)
Admission: RE | Admit: 2020-11-05 | Discharge: 2020-11-05 | Disposition: A | Discharge status: Home / Self Care | Payer: Medicare HMO | Source: Ambulatory Visit | Attending: Gastroenterology | Admitting: Gastroenterology

## 2020-11-05 DIAGNOSIS — Z01812 Encounter for preprocedural laboratory examination: Secondary | ICD-10-CM | POA: Diagnosis not present

## 2020-11-05 DIAGNOSIS — Z20822 Contact with and (suspected) exposure to covid-19: Secondary | ICD-10-CM | POA: Insufficient documentation

## 2020-11-05 LAB — SARS CORONAVIRUS 2 (TAT 6-24 HRS): SARS Coronavirus 2: NEGATIVE

## 2020-11-08 NOTE — Anesthesia Preprocedure Evaluation (Addendum)
Anesthesia Evaluation  Patient identified by MRN, date of birth, ID band Patient awake and Patient confused    Reviewed: Allergy & Precautions, NPO status , Patient's Chart, lab work & pertinent test results  History of Anesthesia Complications Negative for: history of anesthetic complications  Airway Mallampati: II  TM Distance: >3 FB Neck ROM: Full    Dental  (+) Edentulous Upper, Edentulous Lower   Pulmonary COPD, former smoker,    Pulmonary exam normal        Cardiovascular hypertension, Pt. on medications Normal cardiovascular exam  TTE 2021: EF 60-65%, mild LVH, grade I DD, RV mildly enlarged, mildly elevated PASP 43.0 mmHg   Neuro/Psych Depression Dementia CVA    GI/Hepatic Neg liver ROS, GERD  Medicated and Controlled,  Endo/Other  diabetes, Type 2, Oral Hypoglycemic Agents  Renal/GU Renal InsufficiencyRenal disease  negative genitourinary   Musculoskeletal negative musculoskeletal ROS (+)   Abdominal   Peds  Hematology negative hematology ROS (+)   Anesthesia Other Findings Day of surgery medications reviewed with patient.  Reproductive/Obstetrics negative OB ROS                            Anesthesia Physical Anesthesia Plan  ASA: III  Anesthesia Plan: MAC   Post-op Pain Management:    Induction:   PONV Risk Score and Plan: Treatment may vary due to age or medical condition and Propofol infusion  Airway Management Planned: Natural Airway and Nasal Cannula  Additional Equipment: None  Intra-op Plan:   Post-operative Plan:   Informed Consent: I have reviewed the patients History and Physical, chart, labs and discussed the procedure including the risks, benefits and alternatives for the proposed anesthesia with the patient or authorized representative who has indicated his/her understanding and acceptance.     Consent reviewed with POA  Plan Discussed with:  CRNA  Anesthesia Plan Comments:        Anesthesia Quick Evaluation

## 2020-11-09 ENCOUNTER — Other Ambulatory Visit: Payer: Self-pay

## 2020-11-09 ENCOUNTER — Ambulatory Visit (HOSPITAL_COMMUNITY): Payer: Medicare HMO | Admitting: Certified Registered Nurse Anesthetist

## 2020-11-09 ENCOUNTER — Encounter (HOSPITAL_COMMUNITY)
Admission: RE | Disposition: A | Discharge status: Home / Self Care | Payer: Self-pay | Source: Home / Self Care | Attending: Gastroenterology

## 2020-11-09 ENCOUNTER — Encounter (HOSPITAL_COMMUNITY): Payer: Self-pay | Admitting: Gastroenterology

## 2020-11-09 ENCOUNTER — Ambulatory Visit (HOSPITAL_COMMUNITY)
Admission: RE | Admit: 2020-11-09 | Discharge: 2020-11-09 | Disposition: A | Discharge status: Home / Self Care | Payer: Medicare HMO | Attending: Gastroenterology | Admitting: Gastroenterology

## 2020-11-09 DIAGNOSIS — Z79899 Other long term (current) drug therapy: Secondary | ICD-10-CM | POA: Diagnosis not present

## 2020-11-09 DIAGNOSIS — Z87891 Personal history of nicotine dependence: Secondary | ICD-10-CM | POA: Insufficient documentation

## 2020-11-09 DIAGNOSIS — Z86010 Personal history of colonic polyps: Secondary | ICD-10-CM | POA: Diagnosis not present

## 2020-11-09 DIAGNOSIS — Z9049 Acquired absence of other specified parts of digestive tract: Secondary | ICD-10-CM | POA: Insufficient documentation

## 2020-11-09 DIAGNOSIS — Z7984 Long term (current) use of oral hypoglycemic drugs: Secondary | ICD-10-CM | POA: Diagnosis not present

## 2020-11-09 DIAGNOSIS — Z85038 Personal history of other malignant neoplasm of large intestine: Secondary | ICD-10-CM | POA: Insufficient documentation

## 2020-11-09 DIAGNOSIS — J449 Chronic obstructive pulmonary disease, unspecified: Secondary | ICD-10-CM | POA: Diagnosis not present

## 2020-11-09 DIAGNOSIS — I10 Essential (primary) hypertension: Secondary | ICD-10-CM | POA: Diagnosis not present

## 2020-11-09 DIAGNOSIS — Z538 Procedure and treatment not carried out for other reasons: Secondary | ICD-10-CM | POA: Insufficient documentation

## 2020-11-09 DIAGNOSIS — Z7982 Long term (current) use of aspirin: Secondary | ICD-10-CM | POA: Diagnosis not present

## 2020-11-09 DIAGNOSIS — R634 Abnormal weight loss: Secondary | ICD-10-CM | POA: Insufficient documentation

## 2020-11-09 HISTORY — PX: ESOPHAGOGASTRODUODENOSCOPY (EGD) WITH PROPOFOL: SHX5813

## 2020-11-09 HISTORY — PX: COLONOSCOPY WITH PROPOFOL: SHX5780

## 2020-11-09 LAB — GLUCOSE, CAPILLARY: Glucose-Capillary: 86 mg/dL (ref 70–99)

## 2020-11-09 SURGERY — COLONOSCOPY WITH PROPOFOL
Anesthesia: Monitor Anesthesia Care

## 2020-11-09 MED ORDER — PROPOFOL 1000 MG/100ML IV EMUL
INTRAVENOUS | Status: AC
  Filled 2020-11-09: qty 100

## 2020-11-09 MED ORDER — PROPOFOL 10 MG/ML IV BOLUS
INTRAVENOUS | Status: DC | PRN
Start: 2020-11-09 — End: 2020-11-09
  Administered 2020-11-09 (×2): 20 mg via INTRAVENOUS
  Administered 2020-11-09: 30 mg via INTRAVENOUS

## 2020-11-09 MED ORDER — LIDOCAINE 2% (20 MG/ML) 5 ML SYRINGE
INTRAMUSCULAR | Status: DC | PRN
  Administered 2020-11-09: 80 mg via INTRAVENOUS

## 2020-11-09 MED ORDER — SODIUM CHLORIDE 0.9 % IV SOLN: INTRAVENOUS | Status: DC

## 2020-11-09 MED ORDER — PROPOFOL 500 MG/50ML IV EMUL
INTRAVENOUS | Status: DC | PRN
  Administered 2020-11-09: 100 ug/kg/min via INTRAVENOUS

## 2020-11-09 MED ORDER — SODIUM CHLORIDE 0.9 % IV SOLN
INTRAVENOUS | Status: DC
  Administered 2020-11-09: 1000 mL via INTRAVENOUS

## 2020-11-09 SURGICAL SUPPLY — 25 items

## 2020-11-09 NOTE — Op Note (Signed)
[facility] Patient Name: Ronald Dyer Procedure Date: 11/09/2020 MRN: [id number] Attending MD: [name] , MD Date of Birth: 1941/06/13 CSN: [id number] Age: 80 Admit Type: Outpatient Procedure:                Upper GI endoscopy Indications:              Weight loss Providers:                [name], MD, Nelia Shi, RN,                            Fransico Setters Mbumina, Technician Referring MD:             [name] Medicines:                Propofol per Anesthesia, Monitored Anesthesia Care Complications:            No immediate complications. Estimated Blood Loss:     Estimated blood loss: none. Procedure:                Pre-Anesthesia Assessment:                           - Prior to the procedure, a History and Physical                            was performed, and patient medications and                            allergies were reviewed. The patient's tolerance of                            previous anesthesia was also reviewed. The risks                            and benefits of the procedure and the sedation                            options and risks were discussed with the patient.                            All questions were answered, and informed consent                            was obtained. Prior Anticoagulants: The patient has                            taken no previous anticoagulant or antiplatelet                            agents. ASA Grade Assessment: III - A patient with                            severe systemic disease. After reviewing the risks  and benefits, the patient was deemed in                            satisfactory condition to undergo the procedure.                           After obtaining informed consent, the endoscope was                            passed under direct vision. Throughout the                            procedure, the patient's blood pressure, pulse, and                             oxygen saturations were monitored continuously. The                            GIF-H190 ([id number]) Olympus gastroscope was                            introduced through the mouth, and advanced to the                            second part of duodenum. The upper GI endoscopy was                            accomplished without difficulty. The patient                            tolerated the procedure well. Scope In: Scope Out: Findings:      The Z-line was regular and was found 45 cm from the incisors.      Segmental mild mucosal changes characterized by congestion and mosaic       pattern were found in the gastric body.      The cardia and gastric fundus were normal on retroflexion.      The examined duodenum was normal.      Patchy mild mucosal changes characterized by mild mucosal desquamation       were found in the mid esophagus and in the distal esophagus. Impression:               - Z-line regular, 45 cm from the incisors.                           - Congestion and mosaic pattern mucosa in the                            gastric body.                           - Normal examined duodenum.                           - Mild mucosal desquamation mucosa in the esophagus.                           -  No specimens collected. Moderate Sedation:      Not Applicable - Patient had care per Anesthesia. Recommendation:           - Patient has a contact number available for                            emergencies. The signs and symptoms of potential                            delayed complications were discussed with the                            patient. Return to normal activities tomorrow.                            Written discharge instructions were provided to the                            patient.                           - High fiber diet. Procedure Code(s):        --- Professional ---                           [id number], Esophagogastroduodenoscopy, flexible,                             transoral; diagnostic, including collection of                            specimen(s) by brushing or washing, when performed                            (separate procedure) Diagnosis Code(s):        --- Professional ---                           R63.4, Abnormal weight loss CPT copyright 2019 American Medical Association. All rights reserved. The codes documented in this report are preliminary and upon coder review may  be revised to meet current compliance requirements. [name], MD 11/09/2020 9:52:30 AM This report has been signed electronically. Number of Addenda: 0

## 2020-11-09 NOTE — Transfer of Care (Signed)
Immediate Anesthesia Transfer of Care Note  Patient: Ronald Dyer  Procedure(s) Performed: COLONOSCOPY WITH PROPOFOL (N/A ) ESOPHAGOGASTRODUODENOSCOPY (EGD) WITH PROPOFOL (N/A )  Patient Location: Endoscopy Unit  Anesthesia Type:MAC  Level of Consciousness: drowsy and patient cooperative  Airway & Oxygen Therapy: Patient Spontanous Breathing and Patient connected to face mask oxygen  Post-op Assessment: Report given to RN and Post -op Vital signs reviewed and stable  Post vital signs: Reviewed and stable  Last Vitals:  Vitals Value Taken Time  BP    Temp    Pulse 52 11/09/20 0949  Resp 15 11/09/20 0949  SpO2 100 % 11/09/20 0949  Vitals shown include unvalidated device data.  Last Pain:  Vitals:   11/09/20 0802  TempSrc: Oral  PainSc: 0-No pain         Complications: No complications documented.

## 2020-11-09 NOTE — Anesthesia Postprocedure Evaluation (Signed)
Anesthesia Post Note  Patient: Ronald Dyer  Procedure(s) Performed: COLONOSCOPY WITH PROPOFOL (N/A ) ESOPHAGOGASTRODUODENOSCOPY (EGD) WITH PROPOFOL (N/A )     Patient location during evaluation: PACU Anesthesia Type: MAC Level of consciousness: awake and alert and oriented Pain management: pain level controlled Vital Signs Assessment: post-procedure vital signs reviewed and stable Respiratory status: spontaneous breathing, nonlabored ventilation and respiratory function stable Cardiovascular status: blood pressure returned to baseline Postop Assessment: no apparent nausea or vomiting Anesthetic complications: no   No complications documented.  Last Vitals:  Vitals:   11/09/20 0950 11/09/20 1000  BP: 129/69 (!) 153/73  Pulse: (!) 54   Resp: 16 16  Temp:    SpO2: 100% 100%    Last Pain:  Vitals:   11/09/20 1000  TempSrc:   PainSc: 0-No pain                 Brennan Bailey

## 2020-11-09 NOTE — H&P (Signed)
Date of Initial H&P: 11/02/20  History reviewed, patient examined, no change in status, stable for surgery.

## 2020-11-09 NOTE — Op Note (Signed)
[facility] Patient Name: Ronald Dyer Procedure Date: 11/09/2020 MRN: [id number] Attending MD: [name] , MD Date of Birth: 1940-09-08 CSN: [id number] Age: 80 Admit Type: Outpatient Procedure:                Colonoscopy Indications:              Personal history of malignant neoplasm of the                            colon, Weight loss Providers:                [name], MD, Nelia Shi, RN,                            Fransico Setters Mbumina, Technician Referring MD:             [name] Medicines:                Propofol per Anesthesia, Monitored Anesthesia Care Complications:            No immediate complications. Estimated Blood Loss:     Estimated blood loss: none. Procedure:                Pre-Anesthesia Assessment:                           - Prior to the procedure, a History and Physical                            was performed, and patient medications and                            allergies were reviewed. The patient's tolerance of                            previous anesthesia was also reviewed. The risks                            and benefits of the procedure and the sedation                            options and risks were discussed with the patient.                            All questions were answered, and informed consent                            was obtained. Prior Anticoagulants: The patient has                            taken no previous anticoagulant or antiplatelet                            agents. ASA Grade Assessment: III - A patient with  severe systemic disease. After reviewing the risks                            and benefits, the patient was deemed in                            satisfactory condition to undergo the procedure.                           After obtaining informed consent, the colonoscope                            was passed under direct vision. Throughout the                             procedure, the patient's blood pressure, pulse, and                            oxygen saturations were monitored continuously. The                            PCF-[id number] ([id number]) Olympus pediatric colonscope                            was introduced through the anus with the intention                            of advancing to the ileum. The scope was advanced                            to the sigmoid colon before the procedure was                            aborted. Medications were given. The colonoscopy                            was performed with difficulty due to poor bowel                            prep with stool present. The quality of the bowel                            preparation was poor. Scope In: 9:37:59 AM Scope Out: 9:40:33 AM Total Procedure Duration: 0 hours 2 minutes 34 seconds  Findings:      The perianal and digital rectal examinations were normal.      Copious quantities of solid stool was found in the entire colon,       precluding visualization. Impression:               - Preparation of the colon was poor.                           - Stool in the entire examined colon.                           -  No specimens collected. Moderate Sedation:      Not Applicable - Patient had care per Anesthesia. Recommendation:           - Patient has a contact number available for                            emergencies. The signs and symptoms of potential                            delayed complications were discussed with the                            patient. Return to normal activities tomorrow.                            Written discharge instructions were provided to the                            patient.                           - High fiber diet. Procedure Code(s):        --- Professional ---                           [phone], 52, Colonoscopy, flexible; diagnostic,                            including collection of specimen(s) by brushing or                             washing, when performed (separate procedure) Diagnosis Code(s):        --- Professional ---                           R63.4, Abnormal weight loss                           Z85.038, Personal history of other malignant                            neoplasm of large intestine CPT copyright 2019 [employer]. All rights reserved. The codes documented in this report are preliminary and upon coder review may  be revised to meet current compliance requirements. [name], MD 11/09/2020 9:56:13 AM This report has been signed electronically. Number of Addenda: 0

## 2020-11-09 NOTE — Discharge Instructions (Signed)

## 2020-11-09 NOTE — Interval H&P Note (Signed)
History and Physical Interval Note:  11/09/2020 9:22 AM  Ronald Dyer  has presented today for surgery, with the diagnosis of Weight Loss.  The various methods of treatment have been discussed with the patient and family. After consideration of risks, benefits and other options for treatment, the patient has consented to  Procedure(s): COLONOSCOPY WITH PROPOFOL (N/A) ESOPHAGOGASTRODUODENOSCOPY (EGD) WITH PROPOFOL (N/A) as a surgical intervention.  The patient's history has been reviewed, patient examined, no change in status, stable for surgery.  I have reviewed the patient's chart and labs.  Questions were answered to the patient's satisfaction.     [name]

## 2020-11-10 ENCOUNTER — Encounter (HOSPITAL_COMMUNITY): Payer: Self-pay | Admitting: Gastroenterology

## 2020-12-13 DIAGNOSIS — F0150 Vascular dementia without behavioral disturbance: Secondary | ICD-10-CM | POA: Diagnosis not present

## 2020-12-13 DIAGNOSIS — N401 Enlarged prostate with lower urinary tract symptoms: Secondary | ICD-10-CM | POA: Diagnosis not present

## 2020-12-13 DIAGNOSIS — F325 Major depressive disorder, single episode, in full remission: Secondary | ICD-10-CM | POA: Diagnosis not present

## 2020-12-13 DIAGNOSIS — E7800 Pure hypercholesterolemia, unspecified: Secondary | ICD-10-CM | POA: Diagnosis not present

## 2020-12-13 DIAGNOSIS — N183 Chronic kidney disease, stage 3 unspecified: Secondary | ICD-10-CM | POA: Diagnosis not present

## 2020-12-13 DIAGNOSIS — E1122 Type 2 diabetes mellitus with diabetic chronic kidney disease: Secondary | ICD-10-CM | POA: Diagnosis not present

## 2020-12-13 DIAGNOSIS — K219 Gastro-esophageal reflux disease without esophagitis: Secondary | ICD-10-CM | POA: Diagnosis not present

## 2020-12-27 ENCOUNTER — Other Ambulatory Visit: Payer: Self-pay | Admitting: Neurology

## 2020-12-29 IMAGING — MR MR HEAD W/O CM
12 of 13 series · 44 of 48 positions shown · non-contrast
Comparison: Comparison made with prior CT from earlier the same
day.

CLINICAL DATA: Initial evaluation for acute ataxia, stroke
suspected.

EXAM:
MRI HEAD WITHOUT CONTRAST
TECHNIQUE: Multiplanar, multiecho pulse sequences of the brain and surrounding
structures were obtained without intravenous contrast.

[Series 5: DWI · axial · 3.0mm · 0.88mm/px · z∈[-42,+95]mm · 7 of 96 slices shown (1 of 4)]
[im 1/96]
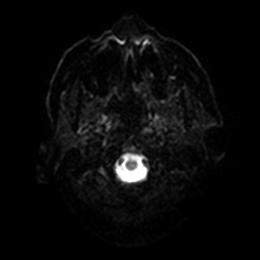
[im 16/96]
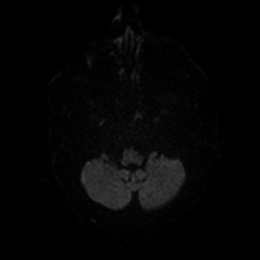
[im 32/96]
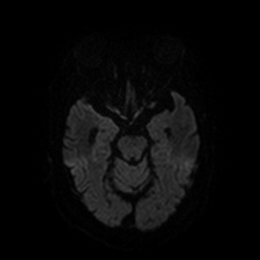
[im 48/96]
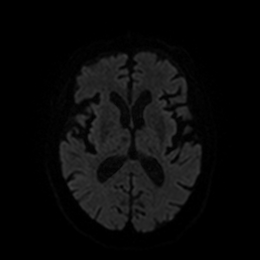
[im 64/96]
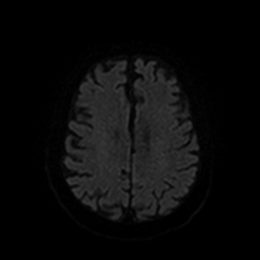
[im 80/96]
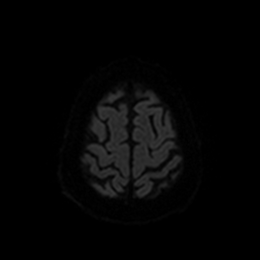
[im 96/96]
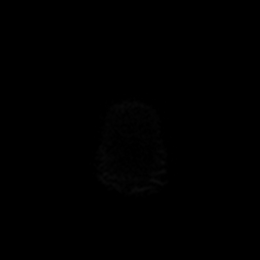

[Series 6: DWI · axial · 3.0mm · 0.88mm/px · z∈[-42,+95]mm · 4 of 48 slices shown (2 of 4)]
[im 1/48]
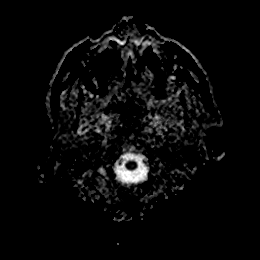
[im 16/48]
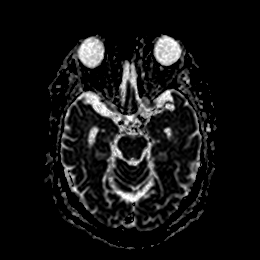
[im 32/48]
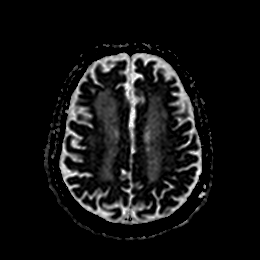
[im 48/48]
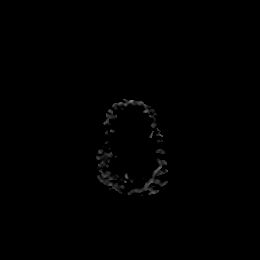

[Series 7: DWI · coronal · 4.0mm · 0.88mm/px · 6 of 72 slices shown (3 of 4)]
[im 1/72]
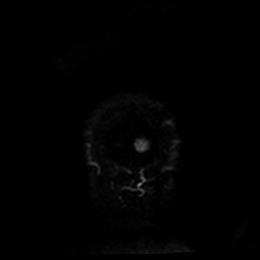
[im 15/72]
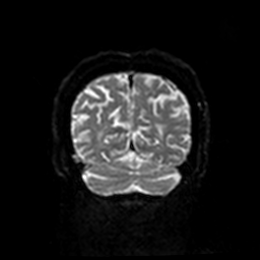
[im 29/72]
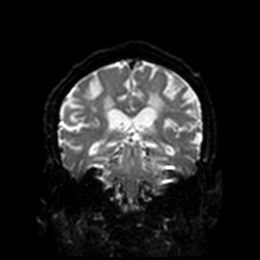
[im 43/72]
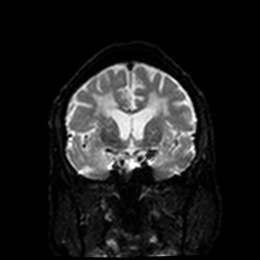
[im 57/72]
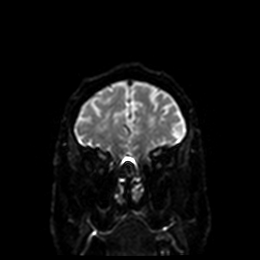
[im 72/72]
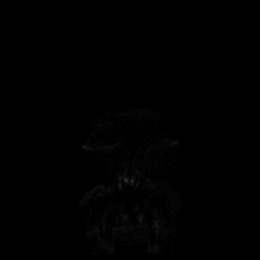

[Series 8: DWI · coronal · 4.0mm · 0.88mm/px · 3 of 36 slices shown (4 of 4)]
[im 1/36]
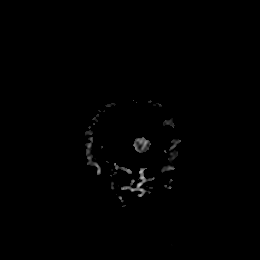
[im 18/36]
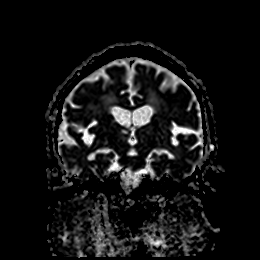
[im 36/36]
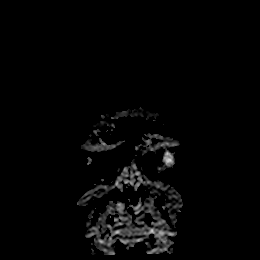

[Series 9: T1 · sagittal · 5.0mm · 0.75mm/px · 2 of 24 slices shown]
[im 1/24]
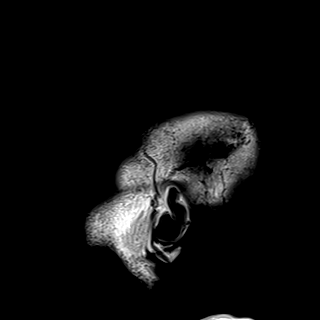
[im 24/24]
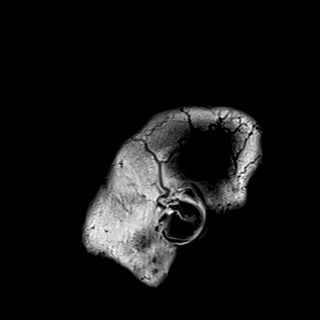

[Series 10: T2 · axial · 5.0mm · 0.90mm/px · z∈[-51,+101]mm · 2 of 27 slices shown (1 of 2)]
[im 1/27]
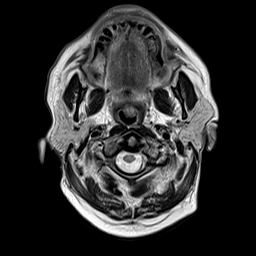
[im 27/27]
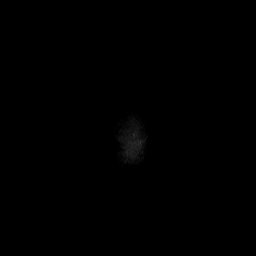

[Series 11: FLAIR · axial · 5.0mm · 0.45mm/px · z∈[-54,+98]mm · 2 of 27 slices shown]
[im 1/27]
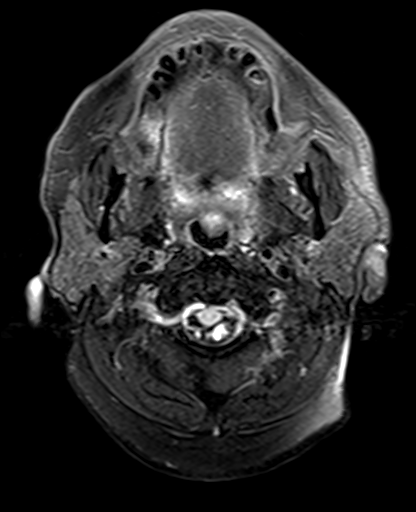
[im 27/27]
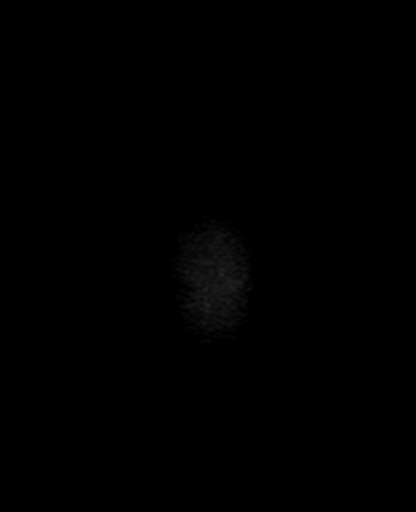

[Series 12: mag_images · axial · 3.0mm · 0.90mm/px · z∈[-45,+92]mm · 4 of 48 slices shown]
[im 1/48]
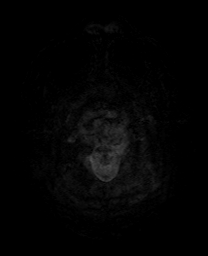
[im 16/48]
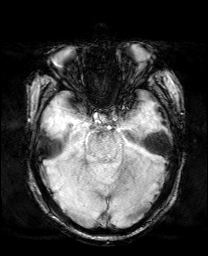
[im 32/48]
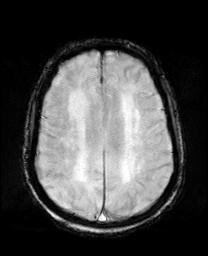
[im 48/48]
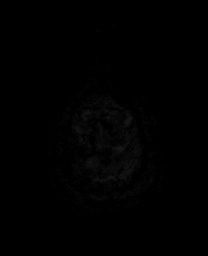

[Series 13: pha_images · axial · 3.0mm · 0.90mm/px · z∈[-45,+89]mm · 4 of 47 slices shown]
[im 1/47]
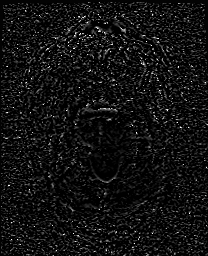
[im 16/47]
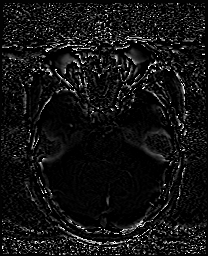
[im 31/47]
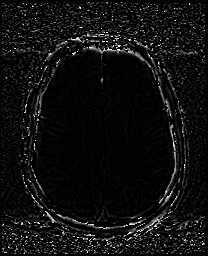
[im 47/47]
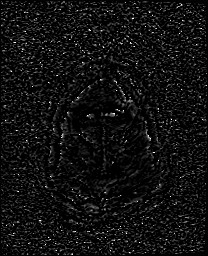

[Series 14: swi_images · axial · 3.0mm · 0.90mm/px · z∈[-45,+92]mm · 4 of 48 slices shown]
[im 1/48]
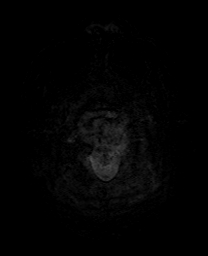
[im 16/48]
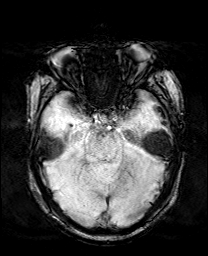
[im 32/48]
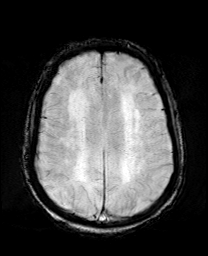
[im 48/48]
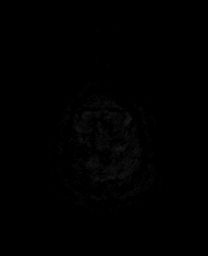

[Series 15: mip_images(sw) · axial · 24.0mm · 0.90mm/px · z∈[-35,+82]mm · 3 of 41 slices shown]
[im 1/41]
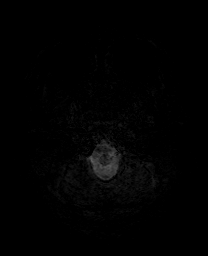
[im 21/41]
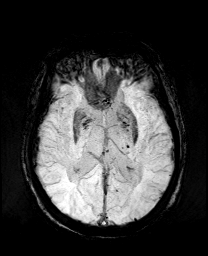
[im 41/41]
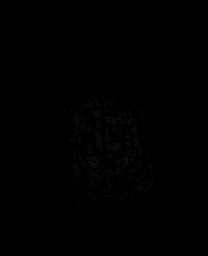

[Series 17: T2 · coronal · 5.0mm · 0.43mm/px · 3 of 32 slices shown (2 of 2)]
[im 1/32]
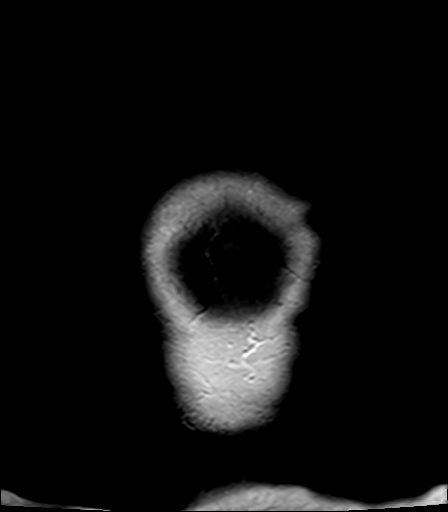
[im 16/32]
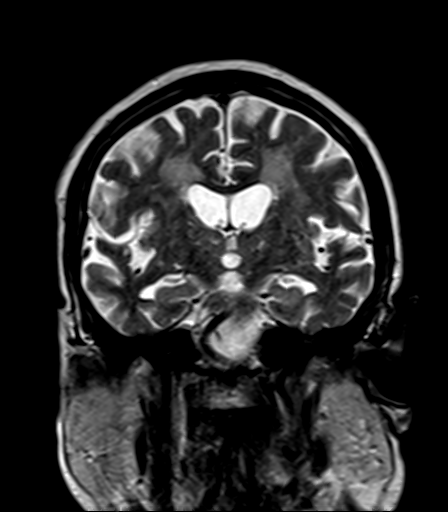
[im 32/32]
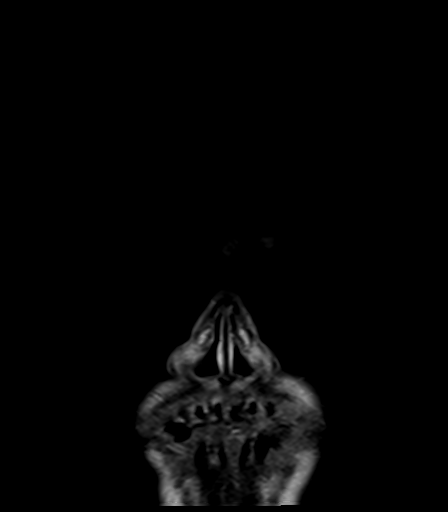

[44 of 48 positions shown; findings below may reference images not displayed]

FINDINGS: Brain: Generalized age-related cerebral atrophy. Patchy and
confluent T2/FLAIR hyperintensity seen within the periventricular
and deep white matter both cerebral hemispheres as well as the pons,
most consistent with chronic small vessel ischemic disease, moderate
in nature. Multiple scattered remote lacunar infarcts noted about
the bilateral thalami and pons.

No abnormal foci of restricted diffusion to suggest acute or
subacute ischemia. Gray-white matter differentiation maintained. No
encephalomalacia to suggest chronic cortical infarction. No evidence
for acute intracranial hemorrhage. Few scattered punctate chronic
micro hemorrhages noted clustered about the deep gray nuclei, most
likely related to chronic underlying hypertension.

No mass lesion, midline shift or mass effect. No hydrocephalus or
extra-axial fluid collection. Pituitary gland suprasellar region
within normal limits. Midline structures intact.

Vascular: Major intracranial vascular flow voids are maintained.

Skull and upper cervical spine: Craniocervical junction within
normal limits. Bone marrow signal intensity normal. No scalp soft
tissue abnormality.

Sinuses/Orbits: Globes and orbital soft tissues demonstrate no acute
finding. Patient status post bilateral ocular lens replacement.
Paranasal sinuses are largely clear. No mastoid effusion. Inner ear
structures grossly normal.

Other: None.
IMPRESSION: 1. No acute intracranial abnormality.
2. Age-related cerebral atrophy with moderate chronic microvascular
ischemic disease, with multiple remote lacunar infarcts about the
bilateral thalami and pons.

## 2021-01-04 DIAGNOSIS — E1122 Type 2 diabetes mellitus with diabetic chronic kidney disease: Secondary | ICD-10-CM | POA: Diagnosis not present

## 2021-01-04 DIAGNOSIS — F0150 Vascular dementia without behavioral disturbance: Secondary | ICD-10-CM | POA: Diagnosis not present

## 2021-01-04 DIAGNOSIS — K219 Gastro-esophageal reflux disease without esophagitis: Secondary | ICD-10-CM | POA: Diagnosis not present

## 2021-01-04 DIAGNOSIS — N183 Chronic kidney disease, stage 3 unspecified: Secondary | ICD-10-CM | POA: Diagnosis not present

## 2021-01-04 DIAGNOSIS — E7800 Pure hypercholesterolemia, unspecified: Secondary | ICD-10-CM | POA: Diagnosis not present

## 2021-01-04 DIAGNOSIS — N401 Enlarged prostate with lower urinary tract symptoms: Secondary | ICD-10-CM | POA: Diagnosis not present

## 2021-01-04 DIAGNOSIS — I129 Hypertensive chronic kidney disease with stage 1 through stage 4 chronic kidney disease, or unspecified chronic kidney disease: Secondary | ICD-10-CM | POA: Diagnosis not present

## 2021-01-04 DIAGNOSIS — F325 Major depressive disorder, single episode, in full remission: Secondary | ICD-10-CM | POA: Diagnosis not present

## 2021-01-04 DIAGNOSIS — Z85038 Personal history of other malignant neoplasm of large intestine: Secondary | ICD-10-CM | POA: Diagnosis not present

## 2021-01-24 DIAGNOSIS — E1122 Type 2 diabetes mellitus with diabetic chronic kidney disease: Secondary | ICD-10-CM | POA: Diagnosis not present

## 2021-01-24 DIAGNOSIS — I129 Hypertensive chronic kidney disease with stage 1 through stage 4 chronic kidney disease, or unspecified chronic kidney disease: Secondary | ICD-10-CM | POA: Diagnosis not present

## 2021-01-24 DIAGNOSIS — F0150 Vascular dementia without behavioral disturbance: Secondary | ICD-10-CM | POA: Diagnosis not present

## 2021-01-24 DIAGNOSIS — K219 Gastro-esophageal reflux disease without esophagitis: Secondary | ICD-10-CM | POA: Diagnosis not present

## 2021-01-24 DIAGNOSIS — F325 Major depressive disorder, single episode, in full remission: Secondary | ICD-10-CM | POA: Diagnosis not present

## 2021-01-24 DIAGNOSIS — E7800 Pure hypercholesterolemia, unspecified: Secondary | ICD-10-CM | POA: Diagnosis not present

## 2021-01-24 DIAGNOSIS — N401 Enlarged prostate with lower urinary tract symptoms: Secondary | ICD-10-CM | POA: Diagnosis not present

## 2021-01-24 DIAGNOSIS — I10 Essential (primary) hypertension: Secondary | ICD-10-CM | POA: Diagnosis not present

## 2021-01-25 ENCOUNTER — Other Ambulatory Visit: Payer: Self-pay

## 2021-01-25 NOTE — Patient Outreach (Signed)
[facility]) Care Management  01/25/2021  Ronald Dyer [facility] 1941-06-25 [id number]   Telephone Assessment   Unsuccessful outreach attempt to patient. No answer at present.      Plan: RN CM will make quarterly outreach attempt to patient within the month of August.    Flynn Lininger Verl Blalock [place] Management Telephonic Care Management Coordinator Direct Phone: [phone] Toll Free: [phone] Fax: [phone]

## 2021-02-25 DIAGNOSIS — F0150 Vascular dementia without behavioral disturbance: Secondary | ICD-10-CM | POA: Diagnosis not present

## 2021-02-25 DIAGNOSIS — I129 Hypertensive chronic kidney disease with stage 1 through stage 4 chronic kidney disease, or unspecified chronic kidney disease: Secondary | ICD-10-CM | POA: Diagnosis not present

## 2021-02-25 DIAGNOSIS — E1122 Type 2 diabetes mellitus with diabetic chronic kidney disease: Secondary | ICD-10-CM | POA: Diagnosis not present

## 2021-02-25 DIAGNOSIS — N183 Chronic kidney disease, stage 3 unspecified: Secondary | ICD-10-CM | POA: Diagnosis not present

## 2021-02-25 DIAGNOSIS — K219 Gastro-esophageal reflux disease without esophagitis: Secondary | ICD-10-CM | POA: Diagnosis not present

## 2021-02-25 DIAGNOSIS — I10 Essential (primary) hypertension: Secondary | ICD-10-CM | POA: Diagnosis not present

## 2021-02-25 DIAGNOSIS — E7800 Pure hypercholesterolemia, unspecified: Secondary | ICD-10-CM | POA: Diagnosis not present

## 2021-04-01 ENCOUNTER — Other Ambulatory Visit: Payer: Self-pay

## 2021-04-01 NOTE — Patient Outreach (Signed)
[facility]) Care Management  04/01/2021  Chas Axel [facility] 1941-03-13 [id number]   Telephone Assessment   Successful quarterly outreach call to patient's wife. She reports patient has been doing about the same and no new changes in condition. She denies any RN CM needs or concerns at this time.    Medications Reviewed Today     Reviewed by Hayden Pedro, RN (Registered Nurse) on 04/01/21 at [phone]  Med List Status: <None>   Medication Order Taking? Sig Documenting Provider Last Dose Status Informant  aspirin 81 MG tablet [id number] No Take 81 mg by mouth daily. [provider] Taking Active Spouse/Significant Other           Med Note Maud Deed   Tue Feb 03, 2020  4:37 PM)    atorvastatin (LIPITOR) 10 MG tablet [id number] No Take 10 mg by mouth 2 (two) times a week. Tuesdays & Thursdays. [provider] 11/08/2020 Unknown time Active Spouse/Significant Other  donepezil (ARICEPT) 10 MG tablet [id number]  TAKE ONE TABLET BY MOUTH EVERYDAY AT BEDTIME Ward Givens, NP  Active   empagliflozin (JARDIANCE) 25 MG TABS tablet [id number] No Take by mouth daily. [provider] 11/07/2020 Unknown time Active Spouse/Significant Other  glimepiride (AMARYL) 4 MG tablet [id number] No Take 8 mg by mouth daily with breakfast. [provider] 11/08/2020 Unknown time Active Spouse/Significant Other           Med Note Wilmon Pali, MELISSA R   Tue Oct 26, 2020  3:18 PM)    hydrochlorothiazide (HYDRODIURIL) 25 MG tablet [id number] No Take 25 mg by mouth every morning. [provider] 11/08/2020 Unknown time Active Spouse/Significant Other  lisinopril (ZESTRIL) 40 MG tablet [id number] No Take 20 mg by mouth daily. [provider] 11/08/2020 Unknown time Active Spouse/Significant Other  Multiple Vitamins-Minerals (MULTIVITAMIN WITH MINERALS) tablet [id number] No Take 1 tablet by mouth daily. [provider] Past Week Unknown time  Active Spouse/Significant Other  pantoprazole (PROTONIX) 40 MG tablet [id number] No Take 1 tablet (40 mg total) by mouth daily at 6 (six) AM. Hosie Poisson, MD 11/08/2020 Unknown time Active Spouse/Significant Other  pioglitazone (ACTOS) 45 MG tablet [id number] No Take 45 mg by mouth daily. [provider] 11/08/2020 Unknown time Active Spouse/Significant Other  sertraline (ZOLOFT) 100 MG tablet [id number] No Take 100 mg by mouth daily. [provider] 11/08/2020 Unknown time Active Spouse/Significant Other  tamsulosin (FLOMAX) 0.4 MG CAPS capsule [id number] No Take 0.4 mg by mouth daily. 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 [provider] Past Week Unknown time Active Spouse/Significant Other               Goals Addressed               This Visit's Progress     COMPLETED: (THN)Prevent Falls-Dementia (pt-stated)        Timeframe:  Long-Range Goal Priority:  High Start Date:  08/03/2020                           Expected End Date:  02/17/2021                     Follow Up Date June 2022  Barriers: None    -use DME consistently inside/outside of the home -remove any safety  barriers in the home -ensure adequate lighting in the home sp at nights to prevent falls     Why is this important?   There may be trouble with balance and getting around. Falls can happen.    Notes:  11/01/20-Caregiver reports no recent falls. Patient continues to remain "prideful" and does not like to sue DME when around other people.   04/01/21-Spouse reports patient has had no falls in almost a year. He has DME and uses as needed.       (THN)Set My Target A1C-Diabetes Type 2 (pt-stated)        Timeframe:  Long-Range Goal Priority:  High Start Date:  08/03/2020                           Expected End Date:   08/19/2021                     Follow Up Date Nov 2022  Barriers: Health Behaviors    -caregiver will continue to monitor cbgs in the home as ordered by MD -caregiver will continue to provide nutritious and diabetic appropriate meals to patient -patient will have A1C level tested q65months or as ordered by MD   Why is this important?   Your target A1C is decided together by you and your doctor.  It is based on several things like your age and other health issues.    Notes:  11/01/20-Most recent A1C level 7.4(Feb 2022) down from 8.5. Caregiver voices that appetite is WNL for patient-eating about two meals per day plus snacks. Wgt 198 lbs.   04/01/21-Spouse reports cbgs remain in the low to mid 100s. No recent labwork. He goes for PCP appt on 03/25/21.        Plan: RN CM will send quarterly update to PCP. RN CM discussed with caregiver next outreach within the month of Nov. Caregiver agrees to care plan and follow up.Caregiver gave verbal consent and in agreement with RN CM follow up and timeframe. Caregiver aware that they may contact RN CM sooner for any issues or concerns.   Enzo Montgomery, RN,BSN,CCM [place] Management Telephonic Care Management Coordinator Direct Phone: [phone] Toll Free: [phone] Fax: [phone]

## 2021-04-04 DIAGNOSIS — F0150 Vascular dementia without behavioral disturbance: Secondary | ICD-10-CM | POA: Diagnosis not present

## 2021-04-04 DIAGNOSIS — E1122 Type 2 diabetes mellitus with diabetic chronic kidney disease: Secondary | ICD-10-CM | POA: Diagnosis not present

## 2021-04-04 DIAGNOSIS — N183 Chronic kidney disease, stage 3 unspecified: Secondary | ICD-10-CM | POA: Diagnosis not present

## 2021-04-04 DIAGNOSIS — E7800 Pure hypercholesterolemia, unspecified: Secondary | ICD-10-CM | POA: Diagnosis not present

## 2021-04-04 DIAGNOSIS — F325 Major depressive disorder, single episode, in full remission: Secondary | ICD-10-CM | POA: Diagnosis not present

## 2021-04-04 DIAGNOSIS — M79672 Pain in left foot: Secondary | ICD-10-CM | POA: Diagnosis not present

## 2021-04-04 DIAGNOSIS — B351 Tinea unguium: Secondary | ICD-10-CM | POA: Diagnosis not present

## 2021-04-04 DIAGNOSIS — M79671 Pain in right foot: Secondary | ICD-10-CM | POA: Diagnosis not present

## 2021-04-04 DIAGNOSIS — I129 Hypertensive chronic kidney disease with stage 1 through stage 4 chronic kidney disease, or unspecified chronic kidney disease: Secondary | ICD-10-CM | POA: Diagnosis not present

## 2021-04-05 ENCOUNTER — Ambulatory Visit: Payer: Medicare HMO | Admitting: Adult Health

## 2021-04-05 ENCOUNTER — Other Ambulatory Visit: Payer: Self-pay

## 2021-04-05 ENCOUNTER — Encounter: Payer: Self-pay | Admitting: Adult Health

## 2021-04-05 VITALS — BP 145/78 | HR 59 | Ht 72.0 in | Wt 206.0 lb

## 2021-04-05 DIAGNOSIS — F0150 Vascular dementia without behavioral disturbance: Secondary | ICD-10-CM

## 2021-04-05 DIAGNOSIS — I69398 Other sequelae of cerebral infarction: Secondary | ICD-10-CM

## 2021-04-05 DIAGNOSIS — R269 Unspecified abnormalities of gait and mobility: Secondary | ICD-10-CM

## 2021-04-05 MED ORDER — MEMANTINE HCL 28 X 5 MG & 21 X 10 MG PO TABS: ORAL_TABLET | ORAL | 0 refills | Status: DC

## 2021-04-05 NOTE — Progress Notes (Signed)
PATIENT: Ronald Dyer DOB: Jan 25, 1941  REASON FOR VISIT: follow up HISTORY FROM: patient Primary Neurologist: Dr. Brett Fairy  HISTORY OF PRESENT ILLNESS: Today 04/05/21: [name] is an 80 year old male with a history of vascular dementia.  He returns today for follow-up.  Overall he feels that he has remained stable.  He is here today with his wife.  His wife states that he is getting out of bed more often now.  Most the time he can complete all ADLs independently.  Occasionally she has to assist with dressing.  She manages all of his medications and the finances.  He reports that he has a good appetite.  She reports that he gets agitated at times but this is manageable.  Returns today for an evaluation.  10/04/20: [name] is an 80 year old male with a history of vascular dementia.  He returns today for follow-up.  Wife reports that over the last year his memory has worsened.  He requires assistance with all ADLs.  His wife manages his medications.  He is able to feed himself.  Reports that he has a good appetite.  Wife states that he sleeps well.  The patient's gait is unsteady.  For that reason, he does not walk much.  Wife reports that he stays in bed most of the day.  He usually eats his meals in bed.  He does have a cane.  Remains on Aricept at bedtime.  Wife feels that there is some component of depression as well.  He is on Zoloft 100 mg at bedtime   HISTORY  10-02-2019. Patient is here speaking to himself, having asked me frequently about a Sport and exercise psychologist care service" , he rambles on about treatment with needles.  He appears not fully oriented to place and situation.  His wife stated he is on and off confused.   He is always sleeping now, often 12 hors or more. He is not talking much at home, but talking to others his wife can't see. He is vividly dreaming, yelling, jerking, fighting some invisible attacker.  This is likeley lewy body dementia overlapping with vascular  dementia.  His wife serves him food in the bed- he watches TV in bed 24/ 7. There is no incentive for him to get up, and his wife wants him to be protected from falls and getting lost.  By now, there is less frequent falls but this is due to PT, however he can't keep up his mobility with this lifestyle.   REVIEW OF SYSTEMS: Out of a complete 14 system review of symptoms, the patient complains only of the following symptoms, and all other reviewed systems are negative.  See HPI  ALLERGIES: Allergies  Allergen Reactions   Simvastatin     High CPK     HOME MEDICATIONS: Outpatient Medications Prior to Visit  Medication Sig Dispense Refill   aspirin 81 MG tablet Take 81 mg by mouth daily.     atorvastatin (LIPITOR) 10 MG tablet Take 10 mg by mouth 2 (two) times a week. Tuesdays & Thursdays.     donepezil (ARICEPT) 10 MG tablet TAKE ONE TABLET BY MOUTH EVERYDAY AT BEDTIME 90 tablet 1   empagliflozin (JARDIANCE) 25 MG TABS tablet Take by mouth daily.     glimepiride (AMARYL) 4 MG tablet Take 8 mg by mouth daily with breakfast.     hydrochlorothiazide (HYDRODIURIL) 25 MG tablet Take 25 mg by mouth every morning.     lisinopril (ZESTRIL) 40 MG tablet Take 20  mg by mouth daily.     Multiple Vitamins-Minerals (MULTIVITAMIN WITH MINERALS) tablet Take 1 tablet by mouth daily.     pantoprazole (PROTONIX) 40 MG tablet Take 1 tablet (40 mg total) by mouth daily at 6 (six) AM. 30 tablet 1   pioglitazone (ACTOS) 45 MG tablet Take 45 mg by mouth daily.     sertraline (ZOLOFT) 100 MG tablet Take 100 mg by mouth daily.     tamsulosin (FLOMAX) 0.4 MG CAPS capsule Take 0.4 mg by mouth daily. Take for 1 month for enlargement of the prostate     vitamin B-12 (CYANOCOBALAMIN) 100 MCG tablet Take 50 mcg by mouth daily.     No facility-administered medications prior to visit.    PAST MEDICAL HISTORY: Past Medical History:  Diagnosis Date   Atherosclerosis of aorta (HCC)    CKD (chronic kidney disease),  stage III (HCC)    COPD (chronic obstructive pulmonary disease) ([place])    Depression    Diabetes mellitus without complication (HCC)    Diabetic retinopathy ([place])    Frequent falls    GERD (gastroesophageal reflux disease)    History of rhabdomyolysis    Hypercholesterolemia    Hypertension    Memory loss    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, Dr Alen Blew d/c 06/2014    Stroke ([place])     PAST SURGICAL HISTORY: Past Surgical History:  Procedure Laterality Date   cataracts Bilateral 2011   COLONOSCOPY WITH PROPOFOL N/A 11/09/2020   Procedure: COLONOSCOPY WITH PROPOFOL;  Surgeon: Wilford Corner, MD;  Location: [facility];  Service: Endoscopy;  Laterality: N/A;  Flex Sig-poor prep   ESOPHAGOGASTRODUODENOSCOPY (EGD) WITH PROPOFOL N/A 11/09/2020   Procedure: ESOPHAGOGASTRODUODENOSCOPY (EGD) WITH PROPOFOL;  Surgeon: Wilford Corner, MD;  Location: [facility];  Service: Endoscopy;  Laterality: N/A;   HEMICOLECTOMY Right 21/30/8657   UMBILICAL HERNIA REPAIR  03/05/2009    FAMILY HISTORY: Family History  Problem Relation Age of Onset   Cancer Father     SOCIAL HISTORY: Social History   Socioeconomic History   Marital status: Married    Spouse name: Not on file   Number of children: Not on file   Years of education: Not on file   Highest education level: Not on file  Occupational History   Not on file  Tobacco Use   Smoking status: Former    Years: 40.00    Types: Cigarettes    Quit date: 09/08/1998    Years since quitting: 22.5   Smokeless tobacco: Former  Scientific laboratory technician Use: Never used  Substance and Sexual Activity   Alcohol use: No   Drug use: No   Sexual activity: Never  Other Topics Concern   Not on file  Social History Narrative   Not on file   Social Determinants of Health   Financial Resource Strain: Not on file  Food Insecurity: No Food Insecurity   Worried About Charity fundraiser in the Last Year: Never  true   [place] in the Last Year: Never true  Transportation Needs: No Transportation Needs   Lack of Transportation (Medical): No   Lack of Transportation (Non-Medical): No  Physical Activity: Not on file  Stress: Not on file  Social Connections: Not on file  Intimate Partner Violence: Not on file      PHYSICAL EXAM  Vitals:   04/05/21 1037  BP: (!) 145/78  Pulse: (!) 59  Weight: 206 lb (93.4 kg)  Height: 6' (1.829 m)    Body mass index is 27.94 kg/m.   MMSE - Mini Mental State Exam 04/05/2021 10/04/2020 10/02/2019  Orientation to time 2 0 3  Orientation to Place 4 3 4   Registration 3 3 3   Attention/ Calculation 0 0 5  Recall 2 2 2   Language- name 2 objects 2 2 2   Language- repeat 1 1 1   Language- follow 3 step command 2 2 3   Language- read & follow direction 0 0 1  Write a sentence 0 0 0  Write a sentence-comments pt refusd to write sentence - -  Copy design 0 0 0  Total score 16 13 24      Generalized: Well developed, in no acute distress   Neurological examination  Mentation: Alert oriented to time, place, history taking. Follows all commands.  Speech is garbled and slurred at times other times it is clear. Cranial nerve II-XII: Pupils were equal round reactive to light. Extraocular movements were full, visual field were full on confrontational test.  Head turning and shoulder shrug  were normal and symmetric. Motor: The motor testing reveals 5 over 5 strength of all 4 extremities. Good symmetric motor tone is noted throughout.  Sensory: Sensory testing is intact to soft touch on all 4 extremities. No evidence of extinction is noted.  Coordination: Cerebellar testing reveals good finger-nose-finger and heel-to-shin bilaterally.  Gait and station: Gait is unsteady.  Uses a cane but still very unsteady.  Uses a wheelchair to rolling my the office today. Reflexes: Deep tendon reflexes are symmetric and normal bilaterally.   DIAGNOSTIC DATA (LABS, IMAGING,  TESTING) - I reviewed patient records, labs, notes, testing and imaging myself where available.  Lab Results  Component Value Date   WBC 7.2 02/05/2020   HGB 12.4 (L) 02/05/2020   HCT 38.0 (L) 02/05/2020   MCV 79.2 (L) 02/05/2020   PLT 157 02/05/2020      Component Value Date/Time   NA 140 02/06/2020 0827   NA 140 07/01/2014 1000   K 4.0 02/06/2020 0827   K 4.1 07/01/2014 1000   CL 110 02/06/2020 0827   CL 107 12/24/2012 1009   CO2 22 02/06/2020 0827   CO2 25 07/01/2014 1000   GLUCOSE 213 (H) 02/06/2020 0827   GLUCOSE 121 07/01/2014 1000   GLUCOSE 107 (H) 12/24/2012 1009   BUN 13 02/06/2020 0827   BUN 8.4 07/01/2014 1000   CREATININE 1.56 (H) 02/06/2020 0827   CREATININE 1.1 07/01/2014 1000   CALCIUM 9.0 02/06/2020 0827   CALCIUM 10.2 07/01/2014 1000   PROT 7.1 07/01/2014 1000   ALBUMIN 3.9 07/01/2014 1000   AST 28 07/01/2014 1000   ALT 30 07/01/2014 1000   ALKPHOS 58 07/01/2014 1000   BILITOT 0.48 07/01/2014 1000   GFRNONAA 41 (L) 02/06/2020 0827   GFRAA 48 (L) 02/06/2020 0827   No results found for: CHOL, HDL, LDLCALC, LDLDIRECT, TRIG, CHOLHDL Lab Results  Component Value Date   HGBA1C 9.8 (H) 02/04/2020   No results found for: [id number] Lab Results  Component Value Date   TSH 1.505 02/04/2020      ASSESSMENT AND PLAN 80 y.o. year old male  has a past medical history of Atherosclerosis of aorta ([place]), CKD (chronic kidney disease), stage III ([place]), COPD (chronic obstructive pulmonary disease) ([place]), Depression, Diabetes mellitus without complication ([place]), Diabetic retinopathy ([place]), Frequent falls, GERD (gastroesophageal reflux disease), History of rhabdomyolysis, Hypercholesterolemia, Hypertension, Memory loss, Personal history of  other malignant neoplasm of large intestine, and Stroke ([place]). here with:  1.  Dementia-vascular   --Memory score stable MMSE 16  out of 30 previously 26 out of 30 --Patient will be started on Namenda titration pack patient and  wife advised to call at the beginning of week for 4 new prescription --Continue on Aricept 10 mg at bedtime --Encouraged regular follow-up with his PCP to manage stroke risk factors --Follow-up in 6 months or sooner if needed   I spent 30 minutes of face-to-face and non-face-to-face time with patient.  This included previsit chart review, discussion of new medication [place], MSN, NP-C 04/05/2021, 10:36 AM [facility] [address], [place], [place] [address] [phone]

## 2021-04-05 NOTE — Patient Instructions (Signed)
Your Plan:  Continue aricept  Start Namenda titration pack.  At the beginning of week 4 please call for new script If your symptoms worsen or you develop new symptoms please let us know.   Thank you for coming to see [place] at [facility]. I hope we have been able to provide you high quality care today.  You may receive a patient satisfaction survey over the next few weeks. We would appreciate your feedback and comments so that we may continue to improve ourselves and the health of our patients.

## 2021-04-07 DIAGNOSIS — N183 Chronic kidney disease, stage 3 unspecified: Secondary | ICD-10-CM | POA: Diagnosis not present

## 2021-04-07 DIAGNOSIS — F325 Major depressive disorder, single episode, in full remission: Secondary | ICD-10-CM | POA: Diagnosis not present

## 2021-04-07 DIAGNOSIS — I129 Hypertensive chronic kidney disease with stage 1 through stage 4 chronic kidney disease, or unspecified chronic kidney disease: Secondary | ICD-10-CM | POA: Diagnosis not present

## 2021-04-07 DIAGNOSIS — E1122 Type 2 diabetes mellitus with diabetic chronic kidney disease: Secondary | ICD-10-CM | POA: Diagnosis not present

## 2021-04-07 DIAGNOSIS — E7800 Pure hypercholesterolemia, unspecified: Secondary | ICD-10-CM | POA: Diagnosis not present

## 2021-04-07 DIAGNOSIS — F0150 Vascular dementia without behavioral disturbance: Secondary | ICD-10-CM | POA: Diagnosis not present

## 2021-04-07 DIAGNOSIS — K219 Gastro-esophageal reflux disease without esophagitis: Secondary | ICD-10-CM | POA: Diagnosis not present

## 2021-04-07 DIAGNOSIS — N401 Enlarged prostate with lower urinary tract symptoms: Secondary | ICD-10-CM | POA: Diagnosis not present

## 2021-04-18 ENCOUNTER — Telehealth: Payer: Self-pay | Admitting: Adult Health

## 2021-04-18 NOTE — Telephone Encounter (Signed)
FYI- Pt wife called stating the memantine (NAMENDA TITRATION PAK) tablet pack is causing some confusion and depression but other than that he is okay.

## 2021-04-19 NOTE — Telephone Encounter (Signed)
I called wife the patient.  She relayed she thought some confusion some depression she inoted since he has been on the memantine titration.  They got a new prescription where it is 10 mg twice a day from the pharmacy today.  I asked her if she felt like it was from the medication itself for more from the disease process and the impression I got was more from just the disease process. He is starting the 10mg  po am and 5mg  po pm, this week then will go to 10mg  po bid.  She will call us when almost done with that then will call in new prescription of 10mg  po bid if doing ok.  She verbalized understanding.

## 2021-04-29 DIAGNOSIS — R972 Elevated prostate specific antigen [PSA]: Secondary | ICD-10-CM | POA: Diagnosis not present

## 2021-04-29 DIAGNOSIS — R351 Nocturia: Secondary | ICD-10-CM | POA: Diagnosis not present

## 2021-04-29 DIAGNOSIS — N401 Enlarged prostate with lower urinary tract symptoms: Secondary | ICD-10-CM | POA: Diagnosis not present

## 2021-04-29 DIAGNOSIS — R310 Gross hematuria: Secondary | ICD-10-CM | POA: Diagnosis not present

## 2021-05-04 ENCOUNTER — Other Ambulatory Visit: Payer: Self-pay

## 2021-05-04 ENCOUNTER — Ambulatory Visit: Payer: Medicare HMO | Admitting: Podiatry

## 2021-05-04 DIAGNOSIS — B351 Tinea unguium: Secondary | ICD-10-CM | POA: Diagnosis not present

## 2021-05-04 DIAGNOSIS — M79675 Pain in left toe(s): Secondary | ICD-10-CM | POA: Diagnosis not present

## 2021-05-04 DIAGNOSIS — E0843 Diabetes mellitus due to underlying condition with diabetic autonomic (poly)neuropathy: Secondary | ICD-10-CM

## 2021-05-04 DIAGNOSIS — M79674 Pain in right toe(s): Secondary | ICD-10-CM | POA: Diagnosis not present

## 2021-05-04 NOTE — Progress Notes (Signed)
   SUBJECTIVE Patient with a history of diabetes mellitus presents to office today complaining of elongated, thickened nails that cause pain while ambulating in shoes.  Patient is unable to trim their own nails. Patient is here for further evaluation and treatment.   Past Medical History:  Diagnosis Date   Atherosclerosis of aorta (HCC)    CKD (chronic kidney disease), stage III (HCC)    COPD (chronic obstructive pulmonary disease) (HCC)    Depression    Diabetes mellitus without complication (HCC)    Diabetic retinopathy ([place])    Frequent falls    GERD (gastroesophageal reflux disease)    History of rhabdomyolysis    Hypercholesterolemia    Hypertension    Memory loss    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, Dr Alen Blew d/c 06/2014    Stroke [facility])     OBJECTIVE General Patient is awake, alert, and oriented x 3 and in no acute distress. Derm Skin is dry and supple bilateral. Negative open lesions or macerations. Remaining integument unremarkable. Nails are tender, long, thickened and dystrophic with subungual debris, consistent with onychomycosis, 1-5 bilateral. No signs of infection noted. Vasc  DP and PT pedal pulses palpable bilaterally. Temperature gradient within normal limits.  Neuro Epicritic and protective threshold sensation diminished bilaterally.  Musculoskeletal Exam No symptomatic pedal deformities noted bilateral. Muscular strength within normal limits.  ASSESSMENT 1. Diabetes Mellitus w/ peripheral neuropathy 2.  Pain due to onychomycosis of toenails bilateral  PLAN OF CARE 1. Patient evaluated today. 2. Instructed to maintain good pedal hygiene and foot care. Stressed importance of controlling blood sugar.  3. Mechanical debridement of nails 1-5 bilaterally performed using a nail nipper. Filed with dremel without incident.  4. Return to clinic in 3 mos.     Edrick Kins, DPM [facility]  Dr. Edrick Kins, DPM    [address], [place] [address]                Office [phone]  Fax [phone]

## 2021-05-13 DIAGNOSIS — F325 Major depressive disorder, single episode, in full remission: Secondary | ICD-10-CM | POA: Diagnosis not present

## 2021-05-13 DIAGNOSIS — E7800 Pure hypercholesterolemia, unspecified: Secondary | ICD-10-CM | POA: Diagnosis not present

## 2021-05-13 DIAGNOSIS — E1122 Type 2 diabetes mellitus with diabetic chronic kidney disease: Secondary | ICD-10-CM | POA: Diagnosis not present

## 2021-05-13 DIAGNOSIS — K219 Gastro-esophageal reflux disease without esophagitis: Secondary | ICD-10-CM | POA: Diagnosis not present

## 2021-05-13 DIAGNOSIS — I129 Hypertensive chronic kidney disease with stage 1 through stage 4 chronic kidney disease, or unspecified chronic kidney disease: Secondary | ICD-10-CM | POA: Diagnosis not present

## 2021-05-13 DIAGNOSIS — N401 Enlarged prostate with lower urinary tract symptoms: Secondary | ICD-10-CM | POA: Diagnosis not present

## 2021-05-13 DIAGNOSIS — N183 Chronic kidney disease, stage 3 unspecified: Secondary | ICD-10-CM | POA: Diagnosis not present

## 2021-06-08 DIAGNOSIS — H02403 Unspecified ptosis of bilateral eyelids: Secondary | ICD-10-CM | POA: Diagnosis not present

## 2021-06-08 DIAGNOSIS — E119 Type 2 diabetes mellitus without complications: Secondary | ICD-10-CM | POA: Diagnosis not present

## 2021-06-08 DIAGNOSIS — H524 Presbyopia: Secondary | ICD-10-CM | POA: Diagnosis not present

## 2021-06-08 DIAGNOSIS — Z961 Presence of intraocular lens: Secondary | ICD-10-CM | POA: Diagnosis not present

## 2021-06-20 ENCOUNTER — Other Ambulatory Visit: Payer: Self-pay

## 2021-06-20 NOTE — Patient Outreach (Signed)
[facility]) Care Management  06/20/2021  Ronald Dyer [facility] August 12, 1941 [id number]   Telephone Assessment    Unsuccessful quarterly outreach attempt to patient/spouse.    Plan: RN CM will make outreach attempt within the month of Dec if no return call.  Enzo Montgomery, RN,BSN,CCM [place] [facility] Telephonic Care Management Coordinator Direct Phone: [phone] Toll Free: [phone] Fax: [phone]

## 2021-06-23 ENCOUNTER — Ambulatory Visit: Payer: Self-pay

## 2021-06-24 ENCOUNTER — Other Ambulatory Visit: Payer: Self-pay | Admitting: Adult Health

## 2021-06-29 DIAGNOSIS — N183 Chronic kidney disease, stage 3 unspecified: Secondary | ICD-10-CM | POA: Diagnosis not present

## 2021-06-29 DIAGNOSIS — N401 Enlarged prostate with lower urinary tract symptoms: Secondary | ICD-10-CM | POA: Diagnosis not present

## 2021-06-29 DIAGNOSIS — I129 Hypertensive chronic kidney disease with stage 1 through stage 4 chronic kidney disease, or unspecified chronic kidney disease: Secondary | ICD-10-CM | POA: Diagnosis not present

## 2021-06-29 DIAGNOSIS — F325 Major depressive disorder, single episode, in full remission: Secondary | ICD-10-CM | POA: Diagnosis not present

## 2021-06-29 DIAGNOSIS — K219 Gastro-esophageal reflux disease without esophagitis: Secondary | ICD-10-CM | POA: Diagnosis not present

## 2021-06-29 DIAGNOSIS — E7800 Pure hypercholesterolemia, unspecified: Secondary | ICD-10-CM | POA: Diagnosis not present

## 2021-06-29 DIAGNOSIS — E1122 Type 2 diabetes mellitus with diabetic chronic kidney disease: Secondary | ICD-10-CM | POA: Diagnosis not present

## 2021-07-25 ENCOUNTER — Other Ambulatory Visit: Payer: Self-pay

## 2021-07-25 NOTE — Patient Outreach (Signed)
[facility]) Care Management  07/25/2021  [place] 1940/10/05 [id number]   Telephone Assessment Annual Assessment  Successful outreach call placed to spouse. She reports patient is doing fairly well. He continues to require assistance with some ADLs/IADLs. Denies any recent falls but reports she has noticed a change in his gait and patient not as active as he was. Appetite remains good. Blood sugars are stable and under control as well as wgt. She denies any RN CM needs or concerns at this time.   Medications Reviewed Today     Reviewed by Hayden Pedro, RN (Registered Nurse) on 07/25/21 at [place] List Status: <None>   Medication Order Taking? Sig Documenting Provider Last Dose Status Informant  ACCU-CHEK SMARTVIEW test strip [id number]   [provider]  Active   aspirin 81 MG tablet [id number] No Take 81 mg by mouth daily. [provider] Taking Active Spouse/Significant Other           Med Note Maud Deed   Tue Feb 03, 2020  4:37 PM)    atorvastatin (LIPITOR) 10 MG tablet [id number] No Take 10 mg by mouth 2 (two) times a week. Tuesdays & Thursdays. [provider] Taking Active Spouse/Significant Other  donepezil (ARICEPT) 10 MG tablet [id number]  TAKE ONE TABLET BY MOUTH EVERYDAY AT BEDTIME Ward Givens, NP  Active   empagliflozin (JARDIANCE) 25 MG TABS tablet [id number] No Take by mouth daily. [provider] Taking Active Spouse/Significant Other  glimepiride (AMARYL) 4 MG tablet [id number] No Take 8 mg by mouth daily with breakfast. [provider] Taking Active Spouse/Significant Other           Med Note Wilmon Pali, MELISSA R   Tue Oct 26, 2020  3:18 PM)    hydrochlorothiazide (HYDRODIURIL) 25 MG tablet [id number] No Take 25 mg by mouth every morning. [provider] Taking Active Spouse/Significant Other  lisinopril (ZESTRIL) 40 MG tablet [id number] No Take 20 mg by mouth daily. [provider] Taking Active Spouse/Significant Other  memantine (NAMENDA TITRATION PAK) tablet pack [id number]  5 mg/day for =1 week; 5 mg twice daily for =1 week; 15 mg/day given in 5 mg and 10 mg separated doses for =1 week; then 10 mg twice daily Ward Givens, NP  Active   memantine (NAMENDA) 5 MG tablet [id number]   [provider]  Active   Multiple Vitamins-Minerals (MULTIVITAMIN WITH MINERALS) tablet [id number] No Take 1 tablet by mouth daily. [provider] Taking Active Spouse/Significant Other  pantoprazole (PROTONIX) 40 MG tablet [id number] No Take 1 tablet (40 mg total) by mouth daily at 6 (six) AM. Hosie Poisson, MD Taking Active Spouse/Significant Other  pioglitazone (ACTOS) 45 MG tablet [id number] No Take 45 mg by mouth daily. [provider] Taking Active Spouse/Significant Other  sertraline (ZOLOFT) 100 MG tablet [id number] No Take 100 mg by mouth daily. [provider] Taking Active Spouse/Significant Other  tamsulosin (FLOMAX) 0.4 MG CAPS capsule [id number] No Take 0.4 mg by mouth daily. Take for 1 month for enlargement of the prostate [provider] Taking Active Spouse/Significant Other  vitamin B-12 (CYANOCOBALAMIN) 100 MCG tablet [id number] No Take 50 mcg by mouth daily. [provider] Taking Active Spouse/Significant Other             Fall Risk 08/03/2020 11/01/2020 11/09/2020 04/01/2021 07/25/2021  Falls in the past year? 1 1 - 1 1  Number of falls in past year - - - - -  Was there  an injury with Fall? 1 0 - 0 0  Fall Risk Category Calculator 3 2 - 1 1  Fall Risk Category High Moderate - Low Low  Patient Fall Risk Level High fall risk High fall risk High fall risk High fall risk Moderate fall risk  Patient at Risk for Falls Due to History of fall(s);Impaired balance/gait;Impaired mobility;Impaired vision;Medication side effect History of fall(s);Impaired vision;Impaired balance/gait;Impaired mobility;Mental status  change;Medication side effect - History of fall(s);Medication side effect;Impaired vision;Mental status change History of fall(s);Impaired balance/gait;Medication side effect;Impaired vision  Fall risk Follow up Education provided;Falls evaluation completed;Falls prevention discussed Falls evaluation completed;Education provided;Falls prevention discussed - Falls evaluation completed;Education provided Falls evaluation completed;Education provided    SDOH Screenings   Alcohol Screen: Not on file  Depression (PHQ2-9): Not on file  Financial Resource Strain: Not on file  Food Insecurity: No Food Insecurity   Worried About Running Out of Food in the Last Year: Never true   Ran Out of Food in the Last Year: Never true  Housing: Low Risk    Last Housing Risk Score: 0  Physical Activity: Not on file  Social Connections: Not on file  Stress: Not on file  Tobacco Use: Medium Risk   Smoking Tobacco Use: Former   Smokeless Tobacco Use: Former   Passive Exposure: Not on Pensions consultant Needs: No Transportation Needs   Lack of Transportation (Medical): No   Lack of Transportation (Non-Medical): No    Depression screen [facility] 2/9 09/05/2017 04/20/2017 04/18/2017 07/09/2014  Decreased Interest _0 0  Down, Depressed, Hopeless _1 0  PHQ - 2 Score _2 0  Altered sleeping 0 1 1 -  Tired, decreased energy _3 -  Change in appetite 0 0 0 -  Feeling bad or failure about yourself  0 0 0 -  Trouble concentrating 0 0 0 -  Moving slowly or fidgety/restless 0 1 1 -  Suicidal thoughts 0 0 0 -  PHQ-9 Score _4 -  Difficult doing work/chores Not difficult at all Not difficult at all - -      Care Plan : Dementia (Adult)  Updates made by Hayden Pedro, RN since 07/25/2021 12:00 AM     Problem: Social and Functional Symptoms      Long-Range Goal: Social and Functional Skills Optimized-Patient will maintain as much independnence/freedom and mobility as possible Completed  07/25/2021  Start Date: 08/03/2020  Expected End Date: 08/19/2021  This Visit's Progress: On track  Recent Progress: On track  Priority: Medium  Note:   Evidence-based guidance:  Assess level of social support; promote maintaining links with family, friends and community to reduce social isolation.  Assess level of function related to basic activities of daily living that include eating, dressing, bathing, as well as instrumental activities of daily living such as shopping, managing finances and use of devices.  Encourage continuation of daily life components such as self-care, home maintenance, financial management, volunteer activities, education opportunities and hobbies.  Refer to occupational or physical therapy to develop comprehensive rehabilitation plan to improve or maintain activities of daily living; consider inclusion of endurance, balance and resistance-training.  Consider complementary therapy such as yoga, music, gardening, outdoor activities, aromatherapy and tai chi.   Notes:  11/01/20 RN CM completed falls/safety eval and ensured safety measures in place. RN CM assessed for changes in condition/status. RN CM provided education and support to caregiver regarding condition/status. RN CM assessed for caregiver burnout/fatigue. RN CM  discussed importance of MD follow up as needed.   04/01/21 RN CM assessed neuro/cognitive status of pt. RN CM confirmed action plan in place. RN CM assessed caregiver coping and reviewed strategies to avoid burnout. RN CM confirmed MD f/u appt in place. [id number] due to duplicate care plan and goals     Task: Facilitate Optimal Social and Functional Skills Completed 07/25/2021  Note:   Care Management Activities:    - activity or exercise based on tolerance encouraged - independence with self-care encouraged    Notes:     Care Plan : Diabetes Type 2 (Adult)  Updates made by Hayden Pedro, RN since 07/25/2021  12:00 AM     Problem: Glycemic Management (Diabetes, Type 2)      Long-Range Goal: Glycemic Management Optimized-Lowering of A1C level from 7.4 Completed 07/25/2021  Start Date: 11/01/2020  Expected End Date: 08/19/2021  This Visit's Progress: On track  Recent Progress: On track  Priority: High  Note:   Evidence-based guidance:  Anticipate A1C testing (point-of-care) every 3 to 6 months based on goal attainment.  Review mutually-set A1C goal or target range.  Anticipate use of antihyperglycemic with or without insulin and periodic adjustments; consider active involvement of pharmacist.  Provide medical nutrition therapy and development of individualized eating.  Compare self-reported symptoms of hypo or hyperglycemia to blood glucose levels, diet and fluid intake, current medications, psychosocial and physiologic stressors, change in activity and barriers to care adherence.  Promote self-monitoring of blood glucose levels.  Assess and address barriers to management plan, such as food insecurity, age, developmental ability, depression, anxiety, fear of hypoglycemia or weight gain, as well as medication cost, side effects and complicated regimen.  Consider referral to community-based diabetes education program, visiting nurse, community health worker or health coach.  Encourage regular dental care for treatment of periodontal disease; refer to dental provider when needed.   Notes:  11/01/2020 RN CM praised patient/caegiverfor lowering of A1C level RN CM assessed for med adherence and completed med review and review DM meds  RN CM provided education on diabetic diet RN CM discussed importance of routine A1C testing and the need to talk with MD regarding next lab work date. RN CM reviewed cbg monitoring log and values with patient/caregiver.  04/01/21 RN CM assessed for med adherence and completed med review and review DM meds  RN CM provided education on diabetic diet RN CM reviewed cbg  monitoring log and values with patient/caregiver.  67/2/09-[id number] due to duplicate care plan and goals    Task: Alleviate Barriers to Glycemic Management Completed 07/25/2021  Note:   Care Management Activities:    - blood glucose readings reviewed - dental care encouraged - mutual A1C goal set or reviewed    Notes:     Care Plan : RN Care Manager POC  Updates made by Hayden Pedro, RN since 07/25/2021 12:00 AM     Problem: Chronic Disease Mgmt of Chronic Condition-DM and Dementia   Priority: High     Long-Range Goal: Development of POC for Mgmt of Chronic Condition-DM   Start Date: 07/25/2021  Expected End Date: 07/25/2022  Priority: High  Note:    Current Barriers:  None  RNCM Clinical Goal(s):  Patient will verbalize basic understanding of  DMII disease process and self health management plan as evidenced by mgmt of chronic conditions take all medications exactly as prescribed and will call provider for medication related questions as evidenced by med adherence and compliance continue to work  with RN Care Manager to address care management and care coordination needs related to  DMII as evidenced by adherence to CM Team Scheduled appointments through collaboration with RN Care manager, provider, and care team.   Interventions: POC sent to PCP upon initial assessment, quarterly and with any changes in patient's conditions Inter-disciplinary care team collaboration (see longitudinal plan of care) Evaluation of current treatment plan related to  self management and patient's adherence to plan as established by provider   Diabetes Interventions:  (Status:  New goal.) Long Term Goal Assessed patient's understanding of A1c goal: <7% Provided education to patient about basic DM disease process Reviewed medications with patient and discussed importance of medication adherence Discussed plans with patient for ongoing care management follow up and provided  patient with direct contact information for care management team Lab Results  Component Value Date   HGBA1C 9.8 (H) 02/04/2020   Patient Goals/Self-Care Activities: Take all medications as prescribed Attend all scheduled provider appointments schedule appointment with eye doctor take the blood sugar log to all doctor visits  Follow Up Plan:  Telephone follow up appointment with care management team member scheduled for:  quarterly-within the month of March The patient has been provided with contact information for the care management team and has been advised to call with any health related questions or concerns.      Long-Range Goal: Development of POC for Mgmt of Chronic Condition-Dementia   Start Date: 07/25/2021  Expected End Date: 07/25/2022  Priority: High  Note:   Current Barriers:  None  RNCM Clinical Goal(s):  Patient will verbalize understanding of plan for management of Dementia as evidenced by mgmt of chronic conditions continue to work with RN Care Manager to address care management and care coordination needs related to  Dementia as evidenced by adherence to CM Team Scheduled appointments through collaboration with RN Care manager, provider, and care team.   Interventions: POC sent to PCP upon initial assessment, quarterly and with any changes in patient's conditions Inter-disciplinary care team collaboration (see longitudinal plan of care) Evaluation of current treatment plan related to  self management and patient's adherence to plan as established by provider   Dementia:  (Status:  New goal.)  Long Term Goal  Emotional Support Provided to patient/caregiver and Discussed importance of attendance to all provider appointments  Patient Goals/Self-Care Activities: Take all medications as prescribed Attend all scheduled provider appointments Call provider office for new concerns or questions   Follow Up Plan:  Telephone follow up appointment with care management  team member scheduled for:  quarterly-within the month of March The patient has been provided with contact information for the care management team and has been advised to call with any health related questions or concerns.        Plan: RN CM discussed with caregiver next outreach within the month of March. Caregiver agrees to care plan and follow up. RN CM will send quarterly update to PCP.   Enzo Montgomery, RN,BSN,CCM [facility] Telephonic Care Management Coordinator Direct Phone: [phone] Toll Free: [phone] Fax: [phone]

## 2021-08-03 ENCOUNTER — Ambulatory Visit: Payer: Medicare HMO | Admitting: Podiatry

## 2021-08-08 ENCOUNTER — Ambulatory Visit: Payer: Medicare HMO | Admitting: Podiatry

## 2021-08-08 ENCOUNTER — Other Ambulatory Visit: Payer: Self-pay

## 2021-08-08 DIAGNOSIS — B351 Tinea unguium: Secondary | ICD-10-CM | POA: Diagnosis not present

## 2021-08-08 DIAGNOSIS — E0843 Diabetes mellitus due to underlying condition with diabetic autonomic (poly)neuropathy: Secondary | ICD-10-CM

## 2021-08-08 DIAGNOSIS — M79675 Pain in left toe(s): Secondary | ICD-10-CM | POA: Diagnosis not present

## 2021-08-08 DIAGNOSIS — M79674 Pain in right toe(s): Secondary | ICD-10-CM | POA: Diagnosis not present

## 2021-08-08 NOTE — Progress Notes (Signed)
° °  SUBJECTIVE Patient with a history of diabetes mellitus presents to office today complaining of elongated, thickened nails that cause pain while ambulating in shoes.  Patient is unable to trim their own nails. Patient is here for further evaluation and treatment.   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 Derm Skin is dry and supple bilateral. Negative open lesions or macerations. Remaining integument unremarkable. Nails are tender, long, thickened and dystrophic with subungual debris, consistent with onychomycosis, 1-5 bilateral. No signs of infection noted. Vasc  DP and PT pedal pulses palpable bilaterally. Temperature gradient within normal limits.  Neuro Epicritic and protective threshold sensation diminished bilaterally.  Musculoskeletal Exam No symptomatic pedal deformities noted bilateral. Muscular strength within normal limits.  ASSESSMENT 1. Diabetes Mellitus w/ peripheral neuropathy 2.  Pain due to onychomycosis of toenails bilateral  PLAN OF CARE 1. Patient evaluated today. 2. Instructed to maintain good pedal hygiene and foot care. Stressed importance of controlling blood sugar.  3. Mechanical debridement of nails 1-5 bilaterally performed using a nail nipper. Filed with dremel without incident.  4. Return to clinic in 3 mos.     Edrick Kins, DPM [facility]  Dr. Edrick Kins, DPM    [address],  [address]                Office [phone]  Fax [phone]

## 2021-08-19 DIAGNOSIS — N183 Chronic kidney disease, stage 3 unspecified: Secondary | ICD-10-CM | POA: Diagnosis not present

## 2021-08-19 DIAGNOSIS — K219 Gastro-esophageal reflux disease without esophagitis: Secondary | ICD-10-CM | POA: Diagnosis not present

## 2021-08-19 DIAGNOSIS — E7800 Pure hypercholesterolemia, unspecified: Secondary | ICD-10-CM | POA: Diagnosis not present

## 2021-08-19 DIAGNOSIS — N401 Enlarged prostate with lower urinary tract symptoms: Secondary | ICD-10-CM | POA: Diagnosis not present

## 2021-08-19 DIAGNOSIS — I129 Hypertensive chronic kidney disease with stage 1 through stage 4 chronic kidney disease, or unspecified chronic kidney disease: Secondary | ICD-10-CM | POA: Diagnosis not present

## 2021-08-19 DIAGNOSIS — F325 Major depressive disorder, single episode, in full remission: Secondary | ICD-10-CM | POA: Diagnosis not present

## 2021-08-19 DIAGNOSIS — E1122 Type 2 diabetes mellitus with diabetic chronic kidney disease: Secondary | ICD-10-CM | POA: Diagnosis not present

## 2021-09-01 ENCOUNTER — Emergency Department (HOSPITAL_COMMUNITY): Payer: Medicare HMO

## 2021-09-01 ENCOUNTER — Encounter (HOSPITAL_COMMUNITY): Payer: Self-pay

## 2021-09-01 ENCOUNTER — Other Ambulatory Visit: Payer: Self-pay

## 2021-09-01 ENCOUNTER — Emergency Department (HOSPITAL_COMMUNITY)
Admission: EM | Admit: 2021-09-01 | Discharge: 2021-09-01 | Disposition: A | Discharge status: Home / Self Care | Payer: Medicare HMO | Attending: Emergency Medicine | Admitting: Emergency Medicine

## 2021-09-01 DIAGNOSIS — Z7982 Long term (current) use of aspirin: Secondary | ICD-10-CM | POA: Insufficient documentation

## 2021-09-01 DIAGNOSIS — Z7984 Long term (current) use of oral hypoglycemic drugs: Secondary | ICD-10-CM | POA: Insufficient documentation

## 2021-09-01 DIAGNOSIS — I129 Hypertensive chronic kidney disease with stage 1 through stage 4 chronic kidney disease, or unspecified chronic kidney disease: Secondary | ICD-10-CM | POA: Diagnosis not present

## 2021-09-01 DIAGNOSIS — R059 Cough, unspecified: Secondary | ICD-10-CM | POA: Diagnosis not present

## 2021-09-01 DIAGNOSIS — Z20822 Contact with and (suspected) exposure to covid-19: Secondary | ICD-10-CM | POA: Insufficient documentation

## 2021-09-01 DIAGNOSIS — R531 Weakness: Secondary | ICD-10-CM | POA: Insufficient documentation

## 2021-09-01 DIAGNOSIS — N189 Chronic kidney disease, unspecified: Secondary | ICD-10-CM | POA: Insufficient documentation

## 2021-09-01 DIAGNOSIS — E860 Dehydration: Secondary | ICD-10-CM | POA: Diagnosis not present

## 2021-09-01 DIAGNOSIS — Z79899 Other long term (current) drug therapy: Secondary | ICD-10-CM | POA: Diagnosis not present

## 2021-09-01 DIAGNOSIS — E1122 Type 2 diabetes mellitus with diabetic chronic kidney disease: Secondary | ICD-10-CM | POA: Insufficient documentation

## 2021-09-01 DIAGNOSIS — I959 Hypotension, unspecified: Secondary | ICD-10-CM | POA: Diagnosis not present

## 2021-09-01 DIAGNOSIS — J449 Chronic obstructive pulmonary disease, unspecified: Secondary | ICD-10-CM | POA: Diagnosis not present

## 2021-09-01 DIAGNOSIS — R300 Dysuria: Secondary | ICD-10-CM | POA: Insufficient documentation

## 2021-09-01 DIAGNOSIS — R9431 Abnormal electrocardiogram [ECG] [EKG]: Secondary | ICD-10-CM | POA: Diagnosis not present

## 2021-09-01 LAB — CBC WITH DIFFERENTIAL/PLATELET
Abs Immature Granulocytes: 0.02 10*3/uL (ref 0.00–0.07)
Basophils Absolute: 0 10*3/uL (ref 0.0–0.1)
Basophils Relative: 0 %
Eosinophils Absolute: 0.1 10*3/uL (ref 0.0–0.5)
Eosinophils Relative: 2 %
HCT: 37.2 % — ABNORMAL LOW (ref 39.0–52.0)
Hemoglobin: 12.4 g/dL — ABNORMAL LOW (ref 13.0–17.0)
Immature Granulocytes: 0 %
Lymphocytes Relative: 21 %
Lymphs Abs: 1.6 10*3/uL (ref 0.7–4.0)
MCH: 26.8 pg (ref 26.0–34.0)
MCHC: 33.3 g/dL (ref 30.0–36.0)
MCV: 80.3 fL (ref 80.0–100.0)
Monocytes Absolute: 0.7 10*3/uL (ref 0.1–1.0)
Monocytes Relative: 10 %
Neutro Abs: 4.9 10*3/uL (ref 1.7–7.7)
Neutrophils Relative %: 67 %
Platelets: 198 10*3/uL (ref 150–400)
RBC: 4.63 MIL/uL (ref 4.22–5.81)
RDW: 15.6 % — ABNORMAL HIGH (ref 11.5–15.5)
WBC: 7.4 10*3/uL (ref 4.0–10.5)
nRBC: 0 % (ref 0.0–0.2)

## 2021-09-01 LAB — COMPREHENSIVE METABOLIC PANEL
ALT: 11 U/L (ref 0–44)
AST: 16 U/L (ref 15–41)
Albumin: 3.3 g/dL — ABNORMAL LOW (ref 3.5–5.0)
Alkaline Phosphatase: 71 U/L (ref 38–126)
Anion gap: 8 (ref 5–15)
BUN: 17 mg/dL (ref 8–23)
CO2: 27 mmol/L (ref 22–32)
Calcium: 9.5 mg/dL (ref 8.9–10.3)
Chloride: 108 mmol/L (ref 98–111)
Creatinine, Ser: 1.9 mg/dL — ABNORMAL HIGH (ref 0.61–1.24)
GFR, Estimated: 35 mL/min — ABNORMAL LOW (ref >60)
Glucose, Bld: 142 mg/dL — ABNORMAL HIGH (ref 70–99)
Potassium: 3.8 mmol/L (ref 3.5–5.1)
Sodium: 143 mmol/L (ref 135–145)
Total Bilirubin: 0.4 mg/dL (ref 0.3–1.2)
Total Protein: 6.3 g/dL — ABNORMAL LOW (ref 6.5–8.1)

## 2021-09-01 LAB — URINALYSIS, ROUTINE W REFLEX MICROSCOPIC
Bacteria, UA: NONE SEEN
Bilirubin Urine: NEGATIVE
Glucose, UA: >=500 mg/dL — AB
Hgb urine dipstick: NEGATIVE
Ketones, ur: NEGATIVE mg/dL
Leukocytes,Ua: NEGATIVE
Nitrite: NEGATIVE
Protein, ur: NEGATIVE mg/dL
Specific Gravity, Urine: 1.01 (ref 1.005–1.030)
pH: 5 (ref 5.0–8.0)

## 2021-09-01 LAB — RESP PANEL BY RT-PCR (FLU A&B, COVID) ARPGX2
Influenza A by PCR: NEGATIVE
Influenza B by PCR: NEGATIVE
SARS Coronavirus 2 by RT PCR: NEGATIVE

## 2021-09-01 LAB — CBG MONITORING, ED: Glucose-Capillary: 132 mg/dL — ABNORMAL HIGH (ref 70–99)

## 2021-09-01 MED ORDER — SODIUM CHLORIDE 0.9 % IV SOLN
1000.0000 mL | Freq: Once | INTRAVENOUS | Status: AC
  Administered 2021-09-01: 1000 mL via INTRAVENOUS

## 2021-09-01 NOTE — Care Management (Incomplete)
ED [facility]  [facility] services. Referral sent to [facility]

## 2021-09-01 NOTE — ED Provider Notes (Signed)
[facility] EMERGENCY DEPARTMENT Provider Note   CSN: Dyer Arrival date & time: 09/01/21  1357     History  Chief Complaint  Patient presents with   Weakness    Ronald Dyer is a 81 y.o. male.  The history is provided by the patient, the EMS personnel, medical records and the spouse. No language interpreter was used.  Weakness  81 year old male significant history of prior stroke, diabetes, alcohol abuse, depression, hypertension, COPD, CKD, memory loss, brought here via EMS from home for evaluation of generalized weakness.  History obtained through patient and through wife who is at bedside.  Patient with prior stroke with problem with his speech as well as having weakness primarily on his right side.  For the past 3 days wife noticed that he has increased weakness, occasional cough, and also report some difficulty urinating.  He does have history of prostate disease.  Today while sitting on the commode, he was unable to get up without assistance thus prompting wife to call EMS to bring him here.  Patient is without any complaint and ask "why am I here".  He does endorse occasional cough for the past 3 days and some urinary difficulty urinating.  He denies having headache neck pain chest pain trouble breathing abdominal pain new focal numbness or focal weakness.  No recent medication changes.  His appetite has been about the same.  No recent sick contact.  Home Medications Prior to Admission medications   Medication Sig Start Date End Date Taking? Authorizing Provider  ACCU-CHEK SMARTVIEW test strip  03/21/21   [provider]  aspirin 81 MG tablet Take 81 mg by mouth daily.    [provider]  atorvastatin (LIPITOR) 10 MG tablet Take 10 mg by mouth 2 (two) times a week. Tuesdays & Thursdays.    [provider]  donepezil (ARICEPT) 10 MG tablet TAKE ONE TABLET BY MOUTH EVERYDAY AT BEDTIME 06/27/21   Ward Givens, NP  empagliflozin  (JARDIANCE) 25 MG TABS tablet Take by mouth daily.    [provider]  glimepiride (AMARYL) 4 MG tablet Take 8 mg by mouth daily with breakfast. 10/16/11   [provider]  hydrochlorothiazide (HYDRODIURIL) 25 MG tablet Take 25 mg by mouth every morning. 10/21/20   [provider]  lisinopril (ZESTRIL) 40 MG tablet Take 20 mg by mouth daily.    [provider]  memantine (NAMENDA TITRATION PAK) tablet pack 5 mg/day for =1 week; 5 mg twice daily for =1 week; 15 mg/day given in 5 mg and 10 mg separated doses for =1 week; then 10 mg twice daily 04/05/21   Ward Givens, NP  memantine [facility]) 5 MG tablet  04/05/21   [provider]  Multiple Vitamins-Minerals (MULTIVITAMIN WITH MINERALS) tablet Take 1 tablet by mouth daily.    [provider]  pantoprazole (PROTONIX) 40 MG tablet Take 1 tablet (40 mg total) by mouth daily at 6 (six) AM. 02/07/20   Hosie Poisson, MD  pioglitazone (ACTOS) 45 MG tablet Take 45 mg by mouth daily.    [provider]  sertraline (ZOLOFT) 100 MG tablet Take 100 mg by mouth daily.    [provider]  tamsulosin (FLOMAX) 0.4 MG CAPS capsule Take 0.4 mg by mouth daily. Take for 1 month for enlargement of the prostate    [provider]  vitamin B-12 (CYANOCOBALAMIN) 100 MCG tablet Take 50 mcg by mouth daily.    [provider]  Allergies    Simvastatin    Review of Systems   Review of Systems  Neurological:  Positive for weakness.  All other systems reviewed and are negative.  Physical Exam Updated Vital Signs BP 134/66    Pulse (!) 56    Temp 97.8 F (36.6 C) (Oral)    Resp 12    Ht 6' (1.829 m)    Wt 90.7 kg    SpO2 100%    BMI 27.12 kg/m  Physical Exam Vitals and nursing note reviewed.  Constitutional:      General: He is not in acute distress.    Appearance: He is well-developed.  HENT:     Head: Atraumatic.  Eyes:     Conjunctiva/sclera: Conjunctivae normal.   Cardiovascular:     Rate and Rhythm: Normal rate and regular rhythm.     Pulses: Normal pulses.     Heart sounds: Normal heart sounds.  Pulmonary:     Effort: Pulmonary effort is normal.     Breath sounds: Normal breath sounds.  Abdominal:     Palpations: Abdomen is soft.  Musculoskeletal:     Cervical back: Neck supple.     Comments: Patient exhibits global weakness but equal strength throughout.  Skin:    Findings: No rash.  Neurological:     Mental Status: He is alert. Mental status is at baseline.  Psychiatric:        Mood and Affect: Mood normal.    ED Results / Procedures / Treatments   Labs (all labs ordered are listed, but only abnormal results are displayed) Labs Reviewed  CBC WITH DIFFERENTIAL/PLATELET - Abnormal; Notable for the following components:      Result Value   Hemoglobin 12.4 (*)    HCT 37.2 (*)    RDW 15.6 (*)    All other components within normal limits  COMPREHENSIVE METABOLIC PANEL - Abnormal; Notable for the following components:   Glucose, Bld 142 (*)    Creatinine, Ser 1.90 (*)    Total Protein 6.3 (*)    Albumin 3.3 (*)    GFR, Estimated 35 (*)    All other components within normal limits  URINALYSIS, ROUTINE W REFLEX MICROSCOPIC - Abnormal; Notable for the following components:   Glucose, UA >=500 (*)    All other components within normal limits  CBG MONITORING, ED - Abnormal; Notable for the following components:   Glucose-Capillary 132 (*)    All other components within normal limits  RESP PANEL BY RT-PCR (FLU A&B, COVID) [id number]    EKG None ED ECG REPORT   Date: 09/01/2021  Rate: 63  Rhythm: normal sinus rhythm  QRS Axis: left  Intervals: QT prolonged  ST/T Wave abnormalities: nonspecific T wave changes  Conduction Disutrbances:none  Narrative Interpretation:   Old EKG Reviewed: unchanged  I have personally reviewed the EKG tracing and agree with the computerized printout as noted.   Radiology DG Chest 2 View  Result  Date: 09/01/2021 CLINICAL DATA:  Cough and weakness. EXAM: CHEST - 2 VIEW COMPARISON:  CTA chest and chest x-ray dated February 04, 2020. FINDINGS: Stable cardiomediastinal silhouette. Normal pulmonary vascularity. Persistent low lung volumes with similar mild bibasilar atelectasis/scarring. No focal consolidation, pleural effusion, or pneumothorax. No acute osseous abnormality. Old right sixth and seventh rib fractures. IMPRESSION: 1. No acute cardiopulmonary disease. Electronically Signed   By: Titus Dubin M.D.   On: 09/01/2021 15:25    Procedures Procedures    Medications Ordered in ED Medications  0.9 %  sodium chloride infusion (1,000 mLs Intravenous New Bag/Given 09/01/21 1526)    ED Course/ Medical Decision Making/ A&P                           Medical Decision Making  BP 132/64 (BP Location: Left Arm)    Pulse 64    Temp 97.8 F (36.6 C) (Oral)    Resp 18    Ht 6' (1.829 m)    Wt 90.7 kg    SpO2 98%    BMI 27.12 kg/m   2:37 PM Patient brought here for progressive worsening weakness for the past 3 days having difficulty getting out from his commode earlier today.  Also endorsed occasional cough and some urinary discomfort.  Does have history of stroke affecting his speech as well as right side weakness.  He is globally weak on exam but is in no acute discomfort.  Has history of dementia so therefore is difficult to appreciate if he has any change in his mentation from baseline.  Wife mention he is at his baseline except he is weaker than usual.  Work-up initiated.   4:08 PM EKG reviewed and dependently interpreted by me.  EKG shows sinus rhythm with borderline Prolonged QT interval.  Chest x-ray reviewed and interpreted by me which shows no acute cardiopulmonary disease.  6:13 PM Labs was independently reviewed and interpreted by me.  Urinalysis obtained today show no signs of urinary tract infection, normal WBC, normal H&H, electrolyte panel shows a creatinine of 1.9 which is  slightly worsened from 1.56 from a year ago.  Patient was given IV fluid here.  Viral respiratory panel was negative, chest x-ray shows no acute cardiopulmonary disease.  At this time I have not identified any acute emergent medical condition.  Will attempt to have patient ambulate.  I also request PT for eval and treat  6:37 PM Attempted to ambulate without success.  Patient is globally weak.  I suspect this is likely deconditioning from his previous stroke.  I have low suspicion for new spinal injury or new stroke.  I did reach out to our social worker who will help coordinate home health care which will likely include PT OT and RN.  I discussed this with patient and with his wife who is at bedside and they are in agreement.  I also provided return precaution.        Final Clinical Impression(s) / ED Diagnoses Final diagnoses:  Generalized weakness  Dehydration    Rx / DC Orders ED Discharge Orders     None         Domenic Moras, PA-C 09/01/21 1849    Milton Ferguson, MD 09/03/21 1208

## 2021-09-01 NOTE — Discharge Instructions (Signed)
You have been evaluated for your weakness.  This is likely a progressions of your prior stroke.  This is also likely due to deconditioning.  I have request for social work along with physical therapy and Occupational Therapy to be involved in your care.  You will receive a phone call from our outpatient care in the next few days to help you with ambulation and with other supports.  Return if you have any concern.

## 2021-09-01 NOTE — ED Notes (Signed)
Patient called out to urinate, but had incontinence before I could assess. Patient cleansed and dried. Bed linen changed. External catheter to suction placed. Spouse at bedside.

## 2021-09-01 NOTE — ED Notes (Addendum)
Pt is very wobbly. pt is very dependent on assist. Pt stated that he has a cane that he uses at home. Pt was asked if he finds walking more difficult than normal, pt said "yes". Pt was placed back into bed, bed at the lowest position, call light within reach.

## 2021-09-01 NOTE — ED Triage Notes (Signed)
"  From home, progressively weaker x 3 days, spouse was worried about him having a stroke because he has history of strokes with significant deficits at baseline from prior strokes. He has no new unilateral deficits or other new stroke symptoms, only generalized weakness" per EMS

## 2021-09-02 ENCOUNTER — Telehealth: Payer: Self-pay | Admitting: Surgery

## 2021-09-02 NOTE — Telephone Encounter (Signed)
[place] manager was updated on transitional care plan the implementation of [facility] services with [facility].

## 2021-09-19 DIAGNOSIS — E7800 Pure hypercholesterolemia, unspecified: Secondary | ICD-10-CM | POA: Diagnosis not present

## 2021-09-19 DIAGNOSIS — N183 Chronic kidney disease, stage 3 unspecified: Secondary | ICD-10-CM | POA: Diagnosis not present

## 2021-09-19 DIAGNOSIS — K219 Gastro-esophageal reflux disease without esophagitis: Secondary | ICD-10-CM | POA: Diagnosis not present

## 2021-09-19 DIAGNOSIS — I129 Hypertensive chronic kidney disease with stage 1 through stage 4 chronic kidney disease, or unspecified chronic kidney disease: Secondary | ICD-10-CM | POA: Diagnosis not present

## 2021-09-19 DIAGNOSIS — F325 Major depressive disorder, single episode, in full remission: Secondary | ICD-10-CM | POA: Diagnosis not present

## 2021-09-19 DIAGNOSIS — E1122 Type 2 diabetes mellitus with diabetic chronic kidney disease: Secondary | ICD-10-CM | POA: Diagnosis not present

## 2021-09-23 ENCOUNTER — Other Ambulatory Visit: Payer: Self-pay | Admitting: Adult Health

## 2021-09-27 ENCOUNTER — Other Ambulatory Visit: Payer: Self-pay

## 2021-09-27 ENCOUNTER — Other Ambulatory Visit: Payer: Medicare HMO

## 2021-09-27 DIAGNOSIS — Z515 Encounter for palliative care: Secondary | ICD-10-CM

## 2021-10-03 NOTE — Progress Notes (Signed)
COMMUNITY PALLIATIVE CARE SW NOTE  PATIENT NAME: Ronald Dyer DOB: Aug 05, 1941 MRN: [id number]  PRIMARY CARE PROVIDER: [name], MD  RESPONSIBLE PARTY:  Acct ID - Guarantor Home Phone Work Phone Relationship Acct Type  [id number] Noell Shular,* [phone]  Self P/F     [place], [place],  [id number]   Due to the COVID-19 crisis, this virtual check-in visit was done via telephone from my office and it was initiated and consent by this patient and or family   [place] (3:50 pm-4:00 pm)  PC SW completed a telephonic check-in visit with patient.  His wife provided a status update on patient. She reported that patient has been stable. SW discussed palliative care, services and support. His wife advised that she will like to continue with palliative care support for patient. SW assessed for any psychosocial need and none were expressed. SW reinforced contact numbers and advised her to call with any questions, concerns or changes in patient's condition.     [address] [place], [place]

## 2021-10-05 DIAGNOSIS — F0150 Vascular dementia without behavioral disturbance: Secondary | ICD-10-CM | POA: Diagnosis not present

## 2021-10-05 DIAGNOSIS — E7800 Pure hypercholesterolemia, unspecified: Secondary | ICD-10-CM | POA: Diagnosis not present

## 2021-10-05 DIAGNOSIS — N183 Chronic kidney disease, stage 3 unspecified: Secondary | ICD-10-CM | POA: Diagnosis not present

## 2021-10-05 DIAGNOSIS — I700 Atherosclerosis of aorta: Secondary | ICD-10-CM | POA: Diagnosis not present

## 2021-10-05 DIAGNOSIS — I10 Essential (primary) hypertension: Secondary | ICD-10-CM | POA: Diagnosis not present

## 2021-10-05 DIAGNOSIS — I6930 Unspecified sequelae of cerebral infarction: Secondary | ICD-10-CM | POA: Diagnosis not present

## 2021-10-05 DIAGNOSIS — E1122 Type 2 diabetes mellitus with diabetic chronic kidney disease: Secondary | ICD-10-CM | POA: Diagnosis not present

## 2021-10-05 DIAGNOSIS — I129 Hypertensive chronic kidney disease with stage 1 through stage 4 chronic kidney disease, or unspecified chronic kidney disease: Secondary | ICD-10-CM | POA: Diagnosis not present

## 2021-10-11 ENCOUNTER — Encounter: Payer: Self-pay | Admitting: Adult Health

## 2021-10-11 ENCOUNTER — Ambulatory Visit: Payer: Medicare HMO | Admitting: Adult Health

## 2021-10-11 VITALS — BP 109/66 | HR 56 | Ht 72.0 in

## 2021-10-11 DIAGNOSIS — R269 Unspecified abnormalities of gait and mobility: Secondary | ICD-10-CM | POA: Diagnosis not present

## 2021-10-11 DIAGNOSIS — I69398 Other sequelae of cerebral infarction: Secondary | ICD-10-CM | POA: Diagnosis not present

## 2021-10-11 DIAGNOSIS — F0150 Vascular dementia without behavioral disturbance: Secondary | ICD-10-CM | POA: Diagnosis not present

## 2021-10-11 NOTE — Progress Notes (Signed)
PATIENT: Ronald Dyer DOB: 1941/05/22  REASON FOR VISIT: follow up HISTORY FROM: patient Primary Neurologist: Dr. Brett Fairy  HISTORY OF PRESENT ILLNESS: Today 10/11/21: Ronald Dyer is an 81 year old male with a history of vascular dementia.  He returns today for follow-up.  He went to the hospital in January for generalized weakness and dehydration.  He is now on palliative care services.  He lives at home with his wife.  She does help him with ADLs.  Reports that he is sleeping a lot.  Reports good appetite.  She reports that no significant change in mood or behavior other than he has no patients.  He is currently on Aricept 10 mg at bedtime.  He is on Namenda 5 mg at bedtime.  His wife reports that he could not tolerate higher dosages but could not give [place] his symptoms.  04/05/21: Ronald Dyer is an 81 year old male with a history of vascular dementia.  He returns today for follow-up.  Overall he feels that he has remained stable.  He is here today with his wife.  His wife states that he is getting out of bed more often now.  Most the time he can complete all ADLs independently.  Occasionally she has to assist with dressing.  She manages all of his medications and the finances.  He reports that he has a good appetite.  She reports that he gets agitated at times but this is manageable.  Returns today for an evaluation.  10/04/20: Ronald Dyer is an 81 year old male with a history of vascular dementia.  He returns today for follow-up.  Wife reports that over the last year his memory has worsened.  He requires assistance with all ADLs.  His wife manages his medications.  He is able to feed himself.  Reports that he has a good appetite.  Wife states that he sleeps well.  The patient's gait is unsteady.  For that reason, he does not walk much.  Wife reports that he stays in bed most of the day.  He usually eats his meals in bed.  He does have a cane.  Remains on Aricept at bedtime.  Wife  feels that there is some component of depression as well.  He is on Zoloft 100 mg at bedtime   HISTORY  10-02-2019. Patient is here speaking to himself, having asked me frequently about a Sport and exercise psychologist care service" , he rambles on about treatment with needles.  He appears not fully oriented to place and situation.  His wife stated he is on and off confused.   He is always sleeping now, often 12 hors or more. He is not talking much at home, but talking to others his wife can't see. He is vividly dreaming, yelling, jerking, fighting some invisible attacker.  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  His wife serves him food in the bed- he watches TV in bed 24/ 7. There is no incentive for him to get up, and his wife wants him to be protected from falls and getting lost.  By now, there is less frequent falls but this is due to PT, however he can't keep up his mobility with this lifestyle.   REVIEW OF SYSTEMS: Out of a complete 14 system review of symptoms, the patient complains only of the following symptoms, and all other reviewed systems are negative.  See HPI  ALLERGIES: Allergies  Allergen Reactions   Metformin Hcl     Other reaction(s): diarrhea  Simvastatin     High CPK     HOME MEDICATIONS: Outpatient Medications Prior to Visit  Medication Sig Dispense Refill   ACCU-CHEK SMARTVIEW test strip      aspirin 81 MG tablet Take 81 mg by mouth daily.     atorvastatin (LIPITOR) 10 MG tablet Take 10 mg by mouth See admin instructions. Take one tablet by mouth on Tuesdays & Thursdays per spouse     donepezil (ARICEPT) 10 MG tablet TAKE ONE TABLET BY MOUTH EVERYDAY AT BEDTIME 90 tablet 0   empagliflozin (JARDIANCE) 25 MG TABS tablet Take 25 mg by mouth daily.     glimepiride (AMARYL) 4 MG tablet Take 8 mg by mouth daily with breakfast.     hydrochlorothiazide (HYDRODIURIL) 25 MG tablet Take 25 mg by mouth every morning.     lisinopril (ZESTRIL) 40 MG tablet Take 20  mg by mouth daily.     memantine (NAMENDA TITRATION PAK) tablet pack 5 mg/day for =1 week; 5 mg twice daily for =1 week; 15 mg/day given in 5 mg and 10 mg separated doses for =1 week; then 10 mg twice daily 49 tablet 0   memantine (NAMENDA) 5 MG tablet Take 5 mg by mouth daily.     Multiple Vitamins-Minerals (MULTIVITAMIN WITH MINERALS) tablet Take 1 tablet by mouth daily.     pantoprazole (PROTONIX) 40 MG tablet Take 1 tablet (40 mg total) by mouth daily at 6 (six) AM. 30 tablet 1   pioglitazone (ACTOS) 45 MG tablet Take 45 mg by mouth daily.     sertraline (ZOLOFT) 100 MG tablet Take 100 mg by mouth daily.     tamsulosin (FLOMAX) 0.4 MG CAPS capsule Take 0.4 mg by mouth daily.     vitamin B-12 (CYANOCOBALAMIN) 100 MCG tablet Take 50 mcg by mouth daily.     No facility-administered medications prior to visit.    PAST MEDICAL HISTORY: Past Medical History:  Diagnosis Date   Atherosclerosis of aorta (HCC)    CKD (chronic kidney disease), stage III (HCC)    COPD (chronic obstructive pulmonary disease) ([place])    Depression    Diabetes mellitus without complication (HCC)    Diabetic retinopathy ([place])    Frequent falls    GERD (gastroesophageal reflux disease)    History of rhabdomyolysis    Hypercholesterolemia    Hypertension    Memory loss    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, Dr Alen Blew d/c 06/2014    Stroke ([place])     PAST SURGICAL HISTORY: Past Surgical History:  Procedure Laterality Date   cataracts Bilateral 2011   COLONOSCOPY WITH PROPOFOL N/A 11/09/2020   Procedure: COLONOSCOPY WITH PROPOFOL;  Surgeon: Wilford Corner, MD;  Location: [facility];  Service: Endoscopy;  Laterality: N/A;  Flex Sig-poor prep   ESOPHAGOGASTRODUODENOSCOPY (EGD) WITH PROPOFOL N/A 11/09/2020   Procedure: ESOPHAGOGASTRODUODENOSCOPY (EGD) WITH PROPOFOL;  Surgeon: Wilford Corner, MD;  Location: [facility];  Service: Endoscopy;  Laterality: N/A;    HEMICOLECTOMY Right 50/35/4656   UMBILICAL HERNIA REPAIR  03/05/2009    FAMILY HISTORY: Family History  Problem Relation Age of Onset   Cancer Father     SOCIAL HISTORY: Social History   Socioeconomic History   Marital status: Married    Spouse name: Not on file   Number of children: Not on file   Years of education: Not on file   Highest education level: Not on file  Occupational History  Not on file  Tobacco Use   Smoking status: Former    Years: 40.00    Types: Cigarettes    Quit date: 09/08/1998    Years since quitting: 23.1   Smokeless tobacco: Former  Scientific laboratory technician Use: Never used  Substance and Sexual Activity   Alcohol use: No   Drug use: No   Sexual activity: Never  Other Topics Concern   Not on file  Social History Narrative   Not on file   Social Determinants of Health   Financial Resource Strain: Not on file  Food Insecurity: No Food Insecurity   Worried About Charity fundraiser in the Last Year: Never true   [place] in the Last Year: Never true  Transportation Needs: No Transportation Needs   Lack of Transportation (Medical): No   Lack of Transportation (Non-Medical): No  Physical Activity: Not on file  Stress: Not on file  Social Connections: Not on file  Intimate Partner Violence: Not on file      PHYSICAL EXAM  Vitals:   10/11/21 1029  BP: 109/66  Pulse: (!) 56  Height: 6' (1.829 m)    Body mass index is 27.12 kg/m.   MMSE - Mini Mental State Exam 10/11/2021 04/05/2021 10/04/2020  Orientation to time 1 2 0  Orientation to Place 4 4 3   Registration 3 3 3   Attention/ Calculation 1 0 0  Recall 2 2 2   Language- name 2 objects 2 2 2   Language- repeat 1 1 1   Language- follow 3 step command 2 2 2   Language- read & follow direction 1 0 0  Write a sentence 0 0 0  Write a sentence-comments - pt refusd to write sentence -  Copy design 0 0 0  Total score 17 16 13      Generalized: Well developed, in no acute distress    Neurological examination  Mentation: Alert oriented to time, place, history taking. Follows all commands.  Speech is garbled and slurred at times other times it is clear. Cranial nerve II-XII: Pupils were equal round reactive to light. Extraocular movements were full, visual field were full on confrontational test.  Head turning and shoulder shrug  were normal and symmetric. Motor: The motor testing reveals 5 over 5 strength in the upper extremities.  4/5 strength in the lower extremities. Sensory: Sensory testing is intact to soft touch on all 4 extremities. No evidence of extinction is noted.  Coordination: Cerebellar testing reveals good finger-nose-finger.  Difficulty with heel-to-shin bilaterally due to weakness Gait and station: Using a wheelchair today. Reflexes: Deep tendon reflexes are symmetric and normal bilaterally.   DIAGNOSTIC DATA (LABS, IMAGING, TESTING) - I reviewed patient records, labs, notes, testing and imaging myself where available.  Lab Results  Component Value Date   WBC 7.4 09/01/2021   HGB 12.4 (L) 09/01/2021   HCT 37.2 (L) 09/01/2021   MCV 80.3 09/01/2021   PLT 198 09/01/2021      Component Value Date/Time   NA 143 09/01/2021 1542   NA 140 07/01/2014 1000   K 3.8 09/01/2021 1542   K 4.1 07/01/2014 1000   CL 108 09/01/2021 1542   CL 107 12/24/2012 1009   CO2 27 09/01/2021 1542   CO2 25 07/01/2014 1000   GLUCOSE 142 (H) 09/01/2021 1542   GLUCOSE 121 07/01/2014 1000   GLUCOSE 107 (H) 12/24/2012 1009   BUN 17 09/01/2021 1542   BUN 8.4 07/01/2014 1000   CREATININE  1.90 (H) 09/01/2021 1542   CREATININE 1.1 07/01/2014 1000   CALCIUM 9.5 09/01/2021 1542   CALCIUM 10.2 07/01/2014 1000   PROT 6.3 (L) 09/01/2021 1542   PROT 7.1 07/01/2014 1000   ALBUMIN 3.3 (L) 09/01/2021 1542   ALBUMIN 3.9 07/01/2014 1000   AST 16 09/01/2021 1542   AST 28 07/01/2014 1000   ALT 11 09/01/2021 1542   ALT 30 07/01/2014 1000   ALKPHOS 71 09/01/2021 1542   ALKPHOS 58  07/01/2014 1000   BILITOT 0.4 09/01/2021 1542   BILITOT 0.48 07/01/2014 1000   GFRNONAA 35 (L) 09/01/2021 1542   GFRAA 48 (L) 02/06/2020 0827   No results found for: CHOL, HDL, LDLCALC, LDLDIRECT, TRIG, CHOLHDL Lab Results  Component Value Date   HGBA1C 9.8 (H) 02/04/2020   No results found for: [id number] Lab Results  Component Value Date   TSH 1.505 02/04/2020      ASSESSMENT AND PLAN 81 y.o. year old male  has a past medical history of Atherosclerosis of aorta ([place]), CKD (chronic kidney disease), stage III ([place]), COPD (chronic obstructive pulmonary disease) ([place]), Depression, Diabetes mellitus without complication ([place]), Diabetic retinopathy ([place]), Frequent falls, GERD (gastroesophageal reflux disease), History of rhabdomyolysis, Hypercholesterolemia, Hypertension, Memory loss, Personal history of other malignant neoplasm of large intestine, and Stroke ([place]). here with:  1.  Dementia-vascular  -- MMSE stable --Continue on Aricept 10 mg at bedtime --Continue Namenda 5 mg at bedtime --Patient is now on palliative care services.  He will follow-up with our office on an as-needed basis   Ward Givens, MSN, NP-C 10/11/2021, 10:59 AM [facility] [address], [place], [place] [address] [phone]

## 2021-10-11 NOTE — Patient Instructions (Signed)
Your Plan:  Continue Aricept 10 mg at bedtime Continue Namenda 4 mg at bedtime Keep regular follow-up with PCP If your symptoms worsen or you develop new symptoms please let us know.   Thank you for coming to see [place] at [facility]. I hope we have been able to provide you high quality care today.  You may receive a patient satisfaction survey over the next few weeks. We would appreciate your feedback and comments so that we may continue to improve ourselves and the health of our patients.

## 2021-10-12 DIAGNOSIS — Z0000 Encounter for general adult medical examination without abnormal findings: Secondary | ICD-10-CM | POA: Diagnosis not present

## 2021-10-12 DIAGNOSIS — Z1389 Encounter for screening for other disorder: Secondary | ICD-10-CM | POA: Diagnosis not present

## 2021-10-20 ENCOUNTER — Other Ambulatory Visit: Payer: Self-pay

## 2021-10-20 NOTE — Patient Outreach (Signed)
[facility]) Care Management ? ?10/20/2021 ? ?Imogene ?29-May-1941 ?[id number] ? ? ?Telephone Assessment ? ?Successful outreach call placed to patient's spouse. She reports patient is doing fairly the same. He continues to suffer from dementia and she feels like it continues to slowly progress. She states he is sleeping more and not able to carry on much meaningful conversations. Patient had ED visit in Jan and has since been set up with [facility]. Spouse reports he is getting HHPT/OT about 1x/wk. Appetite remains good. Blood sugars and wgt stable. She denies any RN CM needs or concerns at this time.  ? ? ?Medications Reviewed Today   ? ? Reviewed by Brandon Melnick, RN (Registered Nurse) on 10/11/21 at 1053  Med List Status: <None>  ? ?Medication Order Taking? Sig Documenting Provider Last Dose Status Informant  ?ACCU-CHEK SMARTVIEW test strip [id number]   [provider]  Active Spouse/Significant Other  ?aspirin 81 MG tablet [id number]  Take 81 mg by mouth daily. [provider]  Active Spouse/Significant Other  ?         ?Med Note Maud Deed   Tue Feb 03, 2020  4:37 PM)    ?atorvastatin (LIPITOR) 10 MG tablet [id number]  Take 10 mg by mouth See admin instructions. Take one tablet by mouth on Tuesdays & Thursdays per spouse [provider]  Active Spouse/Significant Other  ?donepezil (ARICEPT) 10 MG tablet [id number]  TAKE ONE TABLET BY MOUTH EVERYDAY AT BEDTIME Ward Givens, NP  Active   ?empagliflozin (JARDIANCE) 25 MG TABS tablet [id number]  Take 25 mg by mouth daily. [provider]  Active Spouse/Significant Other  ?glimepiride (AMARYL) 4 MG tablet [id number]  Take 8 mg by mouth daily with breakfast. [provider]  Active Spouse/Significant Other  ?         ?Med Note Wilmon Pali, MELISSA R   Tue Oct 26, 2020  3:18 PM)    ?hydrochlorothiazide (HYDRODIURIL) 25 MG tablet [id number]  Take 25 mg by mouth every morning. [provider]  Active Spouse/Significant Other  ?lisinopril (ZESTRIL) 40 MG tablet [id number]  Take 20 mg by mouth daily. [provider]  Active Spouse/Significant Other  ?memantine (NAMENDA TITRATION PAK) tablet pack [id number]  5 mg/day for =1 week; 5 mg twice daily for =1 week; 15 mg/day given in 5 mg and 10 mg separated doses for =1 week; then 10 mg twice daily  ?Patient not taking: Reported on 09/01/2021  ? Ward Givens, NP  Active Spouse/Significant Other  ?memantine (NAMENDA) 5 MG tablet [id number]  Take 5 mg by mouth daily. [provider]  Active Spouse/Significant Other  ?Multiple Vitamins-Minerals (MULTIVITAMIN WITH MINERALS) tablet [id number]  Take 1 tablet by mouth daily. [provider]  Active Spouse/Significant Other  ?pantoprazole (PROTONIX) 40 MG tablet [id number]  Take 1 tablet (40 mg total) by mouth daily at 6 (six) AM. Hosie Poisson, MD  Active Spouse/Significant Other  ?pioglitazone (ACTOS) 45 MG tablet [id number]  Take 45 mg by mouth daily. [provider]  Active Spouse/Significant Other  ?sertraline (ZOLOFT) 100 MG tablet [id number]  Take 100 mg by mouth daily. [provider]  Active Spouse/Significant Other  ?tamsulosin (FLOMAX) 0.4 MG CAPS capsule [id number]  Take 0.4 mg by mouth daily. [provider]  Active Spouse/Significant Other  ?vitamin B-12 (CYANOCOBALAMIN) 100 MCG tablet [id number]  Take 50 mcg by mouth daily. [provider]  Active Spouse/Significant Other  ? ?  ?  ? ?  ?  ? ? ?  Care Plan : RN Care Manager POC  ?Updates made by Hayden Pedro, RN since 10/20/2021 12:00 AM  ?  ? ?Problem: Chronic Disease Mgmt of Chronic Condition-DM and Dementia   ?Priority: High  ?  ? ?Long-Range Goal: Development of POC for Mgmt of Chronic Condition-DM   ?Start Date: 07/25/2021  ?Expected End Date: 07/25/2022  ?This Visit's Progress: On track  ?Priority: High  ?Note:   ? ?Current Barriers:  ?None ? ?RNCM Clinical Goal(s):   ?Patient will verbalize basic understanding of  DMII disease process and self health management plan as evidenced by mgmt of chronic conditions ?take all medications exactly as prescribed and will call provider for medication related questions as evidenced by med adherence and compliance ?continue to work with RN Care Manager to address care management and care coordination needs related to  DMII as evidenced by adherence to CM Team Scheduled appointments through collaboration with RN Care manager, provider, and care team.  ? ?Interventions: ?POC sent to PCP upon initial assessment, quarterly and with any changes in patient's conditions ?Inter-disciplinary care team collaboration (see longitudinal plan of care) ?Evaluation of current treatment plan related to  self management and patient's adherence to plan as established by provider ? ? ?Diabetes Interventions:  (Status:  Goal on track:  Yes.) Long Term Goal ?Assessed patient's understanding of A1c goal: <7% ?Provided education to patient about basic DM disease process ?Reviewed medications with patient and discussed importance of medication adherence ?Discussed plans with patient for ongoing care management follow up and provided patient with direct contact information for care management team ?Lab Results  ?Component Value Date  ? HGBA1C 9.8 (H) 02/04/2020  ?10/20/21-Spouse reports cbgs have bene in the 70s-90s range. Appetite and wt remains stable.  ? ?Patient Goals/Self-Care Activities: ?Take all medications as prescribed ?Attend all scheduled provider appointments ?schedule appointment with eye doctor ?take the blood sugar log to all doctor visits ? ?Follow Up Plan:  Telephone follow up appointment with care management team member scheduled for:  quarterly-within the month of May ?The patient has been provided with contact information for the care management team and has been advised to call with any health related questions or concerns.   ?  ? ?Long-Range  Goal: Development of POC for Mgmt of Chronic Condition-Dementia   ?Start Date: 07/25/2021  ?Expected End Date: 07/25/2022  ?This Visit's Progress: On track  ?Priority: High  ?Note:   ?Current Barriers:  ?None ? ?RNCM Clinical Goal(s):  ?Patient will verbalize understanding of plan for management of Dementia as evidenced by mgmt of chronic conditions ?continue to work with RN Care Manager to address care management and care coordination needs related to  Dementia as evidenced by adherence to CM Team Scheduled appointments through collaboration with RN Care manager, provider, and care team.  ? ?Interventions: ?POC sent to PCP upon initial assessment, quarterly and with any changes in patient's conditions ?Inter-disciplinary care team collaboration (see longitudinal plan of care) ?Evaluation of current treatment plan related to  self management and patient's adherence to plan as established by provider ? ? ?Dementia:  (Status:  Goal on track:  Yes.)  Long Term Goal ? ?Emotional Support Provided to patient/caregiver and Discussed importance of attendance to all provider appointments ? ? ?10/20/21-Spouse reports patient continues to sleep more and not carry on meaningful conversation much. PCS([facility] in place).  ? ?Patient Goals/Self-Care Activities: ?Take all medications as prescribed ?Attend all scheduled provider appointments ?Call provider office for new concerns or questions  ? ?  Follow Up Plan:  Telephone follow up appointment with care management team member scheduled for:  quarterly-within the month of May ?The patient has been provided with contact information for the care management team and has been advised to call with any health related questions or concerns.   ?  ?  ?Plan: ?RN CM discussed with patient next outreach within the month of May. Patient agrees to care plan and follow up. ?RN CM will send quarterly update to PCP.  ? ?Enzo Montgomery, RN,BSN,CCM ?[facility] Care Management ?Telephonic Care Management  Coordinator ?Direct Phone: [phone] ?Toll Free: [phone] ?Fax: [phone] ? ?

## 2021-10-24 ENCOUNTER — Ambulatory Visit: Payer: Medicare HMO

## 2021-10-29 ENCOUNTER — Other Ambulatory Visit: Payer: Self-pay

## 2021-10-29 ENCOUNTER — Encounter (HOSPITAL_COMMUNITY): Payer: Self-pay

## 2021-10-29 ENCOUNTER — Emergency Department (HOSPITAL_COMMUNITY)
Admission: EM | Admit: 2021-10-29 | Discharge: 2021-10-29 | Disposition: A | Discharge status: Home / Self Care | Payer: Medicare HMO | Attending: Emergency Medicine | Admitting: Emergency Medicine

## 2021-10-29 ENCOUNTER — Emergency Department (HOSPITAL_COMMUNITY): Payer: Medicare HMO

## 2021-10-29 DIAGNOSIS — N189 Chronic kidney disease, unspecified: Secondary | ICD-10-CM | POA: Insufficient documentation

## 2021-10-29 DIAGNOSIS — I10 Essential (primary) hypertension: Secondary | ICD-10-CM | POA: Diagnosis not present

## 2021-10-29 DIAGNOSIS — M25512 Pain in left shoulder: Secondary | ICD-10-CM | POA: Insufficient documentation

## 2021-10-29 DIAGNOSIS — E1122 Type 2 diabetes mellitus with diabetic chronic kidney disease: Secondary | ICD-10-CM | POA: Insufficient documentation

## 2021-10-29 DIAGNOSIS — I6782 Cerebral ischemia: Secondary | ICD-10-CM | POA: Diagnosis not present

## 2021-10-29 DIAGNOSIS — I959 Hypotension, unspecified: Secondary | ICD-10-CM | POA: Diagnosis not present

## 2021-10-29 DIAGNOSIS — F0390 Unspecified dementia without behavioral disturbance: Secondary | ICD-10-CM | POA: Diagnosis not present

## 2021-10-29 DIAGNOSIS — Z7982 Long term (current) use of aspirin: Secondary | ICD-10-CM | POA: Insufficient documentation

## 2021-10-29 DIAGNOSIS — M542 Cervicalgia: Secondary | ICD-10-CM | POA: Insufficient documentation

## 2021-10-29 DIAGNOSIS — W19XXXA Unspecified fall, initial encounter: Secondary | ICD-10-CM | POA: Insufficient documentation

## 2021-10-29 DIAGNOSIS — G319 Degenerative disease of nervous system, unspecified: Secondary | ICD-10-CM | POA: Diagnosis not present

## 2021-10-29 DIAGNOSIS — R519 Headache, unspecified: Secondary | ICD-10-CM | POA: Diagnosis not present

## 2021-10-29 DIAGNOSIS — R001 Bradycardia, unspecified: Secondary | ICD-10-CM | POA: Diagnosis not present

## 2021-10-29 DIAGNOSIS — R29810 Facial weakness: Secondary | ICD-10-CM | POA: Insufficient documentation

## 2021-10-29 DIAGNOSIS — S199XXA Unspecified injury of neck, initial encounter: Secondary | ICD-10-CM | POA: Diagnosis not present

## 2021-10-29 DIAGNOSIS — R0902 Hypoxemia: Secondary | ICD-10-CM | POA: Diagnosis not present

## 2021-10-29 DIAGNOSIS — I129 Hypertensive chronic kidney disease with stage 1 through stage 4 chronic kidney disease, or unspecified chronic kidney disease: Secondary | ICD-10-CM | POA: Insufficient documentation

## 2021-10-29 DIAGNOSIS — Z79899 Other long term (current) drug therapy: Secondary | ICD-10-CM | POA: Insufficient documentation

## 2021-10-29 DIAGNOSIS — J449 Chronic obstructive pulmonary disease, unspecified: Secondary | ICD-10-CM | POA: Insufficient documentation

## 2021-10-29 DIAGNOSIS — G4489 Other headache syndrome: Secondary | ICD-10-CM | POA: Diagnosis not present

## 2021-10-29 DIAGNOSIS — S0990XA Unspecified injury of head, initial encounter: Secondary | ICD-10-CM | POA: Diagnosis not present

## 2021-10-29 DIAGNOSIS — M47812 Spondylosis without myelopathy or radiculopathy, cervical region: Secondary | ICD-10-CM | POA: Diagnosis not present

## 2021-10-29 DIAGNOSIS — Y92002 Bathroom of unspecified non-institutional (private) residence single-family (private) house as the place of occurrence of the external cause: Secondary | ICD-10-CM | POA: Insufficient documentation

## 2021-10-29 MED ORDER — ACETAMINOPHEN 500 MG PO TABS
500.0000 mg | ORAL_TABLET | Freq: Once | ORAL | Status: AC
  Administered 2021-10-29: 500 mg via ORAL
  Filled 2021-10-29: qty 1

## 2021-10-29 NOTE — ED Provider Notes (Signed)
[facility] EMERGENCY DEPARTMENT Provider Note   CSN: [id number] Arrival date & time: 10/29/21  9702     History  Chief Complaint  Patient presents with   Ronald Dyer    Ronald Dyer is a 81 y.o. male.  Patient presents to the emergency department via EMS after an unwitnessed fall at home.  Patient's wife found patient laying in the floor of the bathroom complaining of a headache.  Patient is currently in c-collar.  Patient complains of headache, neck pain, and left shoulder pain.  Patient has significant history of prior stroke with residual right sided weakness, dementia, diabetes, alcohol abuse, depression, hypertension, COPD, CKD. Patient does not remember the fall. There is some mild facial droop on the right which is confirmed as baseline by patient's wife. He denies chest pain, shortness of breath, abdominal pain, new focal weakness, numbness.   HPI     Home Medications Prior to Admission medications   Medication Sig Start Date End Date Taking? Authorizing Provider  ACCU-CHEK SMARTVIEW test strip  03/21/21   [provider]  aspirin 81 MG tablet Take 81 mg by mouth daily.    [provider]  atorvastatin (LIPITOR) 10 MG tablet Take 10 mg by mouth See admin instructions. Take one tablet by mouth on Tuesdays & Thursdays per spouse    [provider]  donepezil (ARICEPT) 10 MG tablet TAKE ONE TABLET BY MOUTH EVERYDAY AT BEDTIME 09/27/21   Ward Givens, NP  empagliflozin (JARDIANCE) 25 MG TABS tablet Take 25 mg by mouth daily.    [provider]  glimepiride (AMARYL) 4 MG tablet Take 8 mg by mouth daily with breakfast. 10/16/11   [provider]  hydrochlorothiazide (HYDRODIURIL) 25 MG tablet Take 25 mg by mouth every morning. 10/21/20   [provider]  lisinopril (ZESTRIL) 40 MG tablet Take 20 mg by mouth daily.    [provider]  memantine (NAMENDA TITRATION PAK) tablet pack 5 mg/day for =1 week; 5 mg  twice daily for =1 week; 15 mg/day given in 5 mg and 10 mg separated doses for =1 week; then 10 mg twice daily 04/05/21   Ward Givens, NP  memantine (NAMENDA) 5 MG tablet Take 5 mg by mouth daily. 04/05/21   [provider]  Multiple Vitamins-Minerals (MULTIVITAMIN WITH MINERALS) tablet Take 1 tablet by mouth daily.    [provider]  pantoprazole (PROTONIX) 40 MG tablet Take 1 tablet (40 mg total) by mouth daily at 6 (six) AM. 02/07/20   Hosie Poisson, MD  pioglitazone (ACTOS) 45 MG tablet Take 45 mg by mouth daily.    [provider]  sertraline (ZOLOFT) 100 MG tablet Take 100 mg by mouth daily.    [provider]  tamsulosin (FLOMAX) 0.4 MG CAPS capsule Take 0.4 mg by mouth daily.    [provider]  vitamin B-12 (CYANOCOBALAMIN) 100 MCG tablet Take 50 mcg by mouth daily.    [provider]      Allergies    Metformin hcl and Simvastatin    Review of Systems   Review of Systems  Respiratory:  Negative for shortness of breath.   Cardiovascular:  Negative for chest pain.  Gastrointestinal:  Negative for abdominal pain.  Musculoskeletal:  Positive for arthralgias and neck pain.  Neurological:  Positive for headaches.   Physical Exam Updated Vital Signs BP (!) 134/56    Pulse (!) 49    Temp 98.1 F (36.7 C) (Oral)  Resp 12    Ht 6' (1.829 m)    SpO2 100%    BMI 27.12 kg/m  Physical Exam Vitals and nursing note reviewed.  Constitutional:      General: He is not in acute distress. HENT:     Head: Normocephalic.  Eyes:     Conjunctiva/sclera: Conjunctivae normal.     Pupils: Pupils are equal, round, and reactive to light.  Neck:     Comments: Patient in c-collar Cardiovascular:     Rate and Rhythm: Normal rate and regular rhythm.     Pulses: Normal pulses.  Pulmonary:     Effort: Pulmonary effort is normal.     Breath sounds: Normal breath sounds.  Abdominal:     Palpations: Abdomen is soft.     Tenderness: There is no  abdominal tenderness.  Musculoskeletal:        General: Tenderness (Tenderness to general left shoulder) present.     Cervical back: Tenderness (Generalized neck tenderness) present.  Skin:    General: Skin is warm and dry.  Neurological:     Mental Status: He is alert. Mental status is at baseline.     Sensory: Sensation is intact.     Comments: CN II-VII, XI, XII grossly intact. Slight facial droop at baseline.     ED Results / Procedures / Treatments   Labs (all labs ordered are listed, but only abnormal results are displayed) Labs Reviewed - No data to display  EKG EKG Interpretation  Date/Time:  Saturday October 29 2021 06:39:48 EST Ventricular Rate:  52 PR Interval:  185 QRS Duration: 108 QT Interval:  470 QTC Calculation: 438 R Axis:   1 Text Interpretation: Sinus rhythm No significant change since last tracing Confirmed by Isla Pence [phone]) on 10/29/2021 7:02:41 AM  Radiology CT Head Wo Contrast  Result Date: 10/29/2021 CLINICAL DATA:  81 year old male with history of unwitnessed fall. Trauma to the head and neck. EXAM: CT HEAD WITHOUT CONTRAST CT CERVICAL SPINE WITHOUT CONTRAST TECHNIQUE: Multidetector CT imaging of the head and cervical spine was performed following the standard protocol without intravenous contrast. Multiplanar CT image reconstructions of the cervical spine were also generated. RADIATION DOSE REDUCTION: This exam was performed according to the departmental dose-optimization program which includes automated exposure control, adjustment of the mA and/or kV according to patient size and/or use of iterative reconstruction technique. COMPARISON:  Head CT 02/03/2020.  Brain MRI 07/04/2020. FINDINGS: CT HEAD FINDINGS Brain: Moderate cerebral and cerebellar atrophy. Patchy and confluent areas of decreased attenuation are noted throughout the deep and periventricular white matter of the cerebral hemispheres bilaterally, compatible with chronic microvascular  ischemic disease. No evidence of acute infarction, hemorrhage, hydrocephalus, extra-axial collection or mass lesion/mass effect. Vascular: No hyperdense vessel or unexpected calcification. Skull: Normal. Negative for fracture or focal lesion. Sinuses/Orbits: No acute finding. Other: None. CT CERVICAL SPINE FINDINGS Alignment: Normal. Skull base and vertebrae: No acute fracture. No primary bone lesion or focal pathologic process. Soft tissues and spinal canal: No prevertebral fluid or swelling. No visible canal hematoma. Disc levels: Multilevel degenerative disc disease, most pronounced at C5-C6 and C6-C7. Mild multilevel facet arthropathy. Upper chest: Negative. Other: None. IMPRESSION: 1. No evidence of significant acute traumatic injury to the skull, brain or cervical spine. 2. Moderate cerebral and cerebellar atrophy with extensive chronic microvascular ischemic changes in the cerebral white matter redemonstrated, as above. 3. Multilevel degenerative disc disease and cervical spondylosis, as above. Electronically Signed   By: Mauri Brooklyn.D.  On: 10/29/2021 07:31   CT Cervical Spine Wo Contrast  Result Date: 10/29/2021 CLINICAL DATA:  81 year old male with history of unwitnessed fall. Trauma to the head and neck. EXAM: CT HEAD WITHOUT CONTRAST CT CERVICAL SPINE WITHOUT CONTRAST TECHNIQUE: Multidetector CT imaging of the head and cervical spine was performed following the standard protocol without intravenous contrast. Multiplanar CT image reconstructions of the cervical spine were also generated. RADIATION DOSE REDUCTION: This exam was performed according to the departmental dose-optimization program which includes automated exposure control, adjustment of the mA and/or kV according to patient size and/or use of iterative reconstruction technique. COMPARISON:  Head CT 02/03/2020.  Brain MRI 07/04/2020. FINDINGS: CT HEAD FINDINGS Brain: Moderate cerebral and cerebellar atrophy. Patchy and confluent  areas of decreased attenuation are noted throughout the deep and periventricular white matter of the cerebral hemispheres bilaterally, compatible with chronic microvascular ischemic disease. No evidence of acute infarction, hemorrhage, hydrocephalus, extra-axial collection or mass lesion/mass effect. Vascular: No hyperdense vessel or unexpected calcification. Skull: Normal. Negative for fracture or focal lesion. Sinuses/Orbits: No acute finding. Other: None. CT CERVICAL SPINE FINDINGS Alignment: Normal. Skull base and vertebrae: No acute fracture. No primary bone lesion or focal pathologic process. Soft tissues and spinal canal: No prevertebral fluid or swelling. No visible canal hematoma. Disc levels: Multilevel degenerative disc disease, most pronounced at C5-C6 and C6-C7. Mild multilevel facet arthropathy. Upper chest: Negative. Other: None. IMPRESSION: 1. No evidence of significant acute traumatic injury to the skull, brain or cervical spine. 2. Moderate cerebral and cerebellar atrophy with extensive chronic microvascular ischemic changes in the cerebral white matter redemonstrated, as above. 3. Multilevel degenerative disc disease and cervical spondylosis, as above. Electronically Signed   By: Vinnie Langton M.D.   On: 10/29/2021 07:31   DG Shoulder Left  Result Date: 10/29/2021 CLINICAL DATA:  Pain secondary to fall EXAM: LEFT SHOULDER - 3 VIEW COMPARISON:  None. FINDINGS: There is no evidence of fracture or dislocation. Mild spurring at the lower glenohumeral joint. IMPRESSION: Negative for fracture or dislocation. Electronically Signed   By: Jorje Guild M.D.   On: 10/29/2021 07:44    Procedures Procedures    Medications Ordered in ED Medications  acetaminophen (TYLENOL) tablet 500 mg (has no administration in time range)    ED Course/ Medical Decision Making/ A&P                           Medical Decision Making Amount and/or Complexity of Data Reviewed Radiology: ordered.   This  patient presents to the ED for concern of headache secondary to an unwitnessed fall, this involves an extensive number of treatment options, and is a complaint that carries with it a high risk of complications and morbidity.  The differential diagnosis includes but is not limited to intracranial hemorrhage, concussion, tension headache. The patient also has shoulder pain concerning for possible fracture.    Co morbidities that complicate the patient evaluation  Dementia   Additional history obtained:  Additional history obtained from EMS and patient's wife External records from outside source obtained and reviewed including ED note from 09/01/21   Lab Tests:  None   Imaging Studies ordered:  I ordered imaging studies including CT head, CT c-spine, DG shoulder  I independently visualized and interpreted imaging which showed no acute abnormality/injury in the head/spine, no fracture or dislocation of left shoulder I agree with the radiologist interpretation    Medicines ordered and prescription drug management:  I  ordered medication including Tylenol for headache  Reevaluation of the patient after these medicines showed that the patient improved I have reviewed the patients home medicines and have made adjustments as needed   Test Considered:  CBC  The patient's head imaging was negative for acute injury or bleed. No fracture was noted in the c-spine or left shoulder. The patient has been able to ambulate with nursing. At this time I see no indication for admission. The patient may discharge home. Return precautions provided    Final Clinical Impression(s) / ED Diagnoses Final diagnoses:  Fall, initial encounter    Rx / DC Orders ED Discharge Orders     None         Ronny Bacon 10/29/21 2951    Isla Pence, MD 10/29/21 [phone]

## 2021-10-29 NOTE — Discharge Instructions (Signed)
You were seen today for evaluation after a fall. The imaging shows no obvious injury. I have provided information on fall prevention in the home. I recommend discussing this further with your primary care provider. You may take Tylenol for the headache as needed. Return to the emergency department if you develop life threatening conditions such as chest pain, shortness of breath, or altered level of consciousness ?

## 2021-10-29 NOTE — ED Notes (Signed)
ASSIT PATIENT WITH WALKER PATIENT MOD. UNSTEADY.NEEDS ASSITANCE ?

## 2021-10-29 NOTE — ED Triage Notes (Signed)
Pt BIB [facility] from home after unwitnessed fall. Pt's wife found pt lying in bathroom. Pt c/o headache and wife believes pt hit his head. No thinners, c-collar in place. Bruise to L upper arm. ?Hx- Dementia  ?

## 2021-11-08 ENCOUNTER — Other Ambulatory Visit: Payer: Self-pay

## 2021-11-08 ENCOUNTER — Encounter (HOSPITAL_COMMUNITY): Payer: Self-pay

## 2021-11-08 ENCOUNTER — Emergency Department (HOSPITAL_COMMUNITY): Payer: Medicare HMO

## 2021-11-08 ENCOUNTER — Emergency Department (HOSPITAL_COMMUNITY)
Admission: EM | Admit: 2021-11-08 | Discharge: 2021-11-08 | Disposition: A | Discharge status: Home / Self Care | Payer: Medicare HMO | Attending: Critical Care Medicine | Admitting: Critical Care Medicine

## 2021-11-08 DIAGNOSIS — R4701 Aphasia: Secondary | ICD-10-CM | POA: Diagnosis not present

## 2021-11-08 DIAGNOSIS — I700 Atherosclerosis of aorta: Secondary | ICD-10-CM | POA: Diagnosis not present

## 2021-11-08 DIAGNOSIS — N400 Enlarged prostate without lower urinary tract symptoms: Secondary | ICD-10-CM | POA: Diagnosis not present

## 2021-11-08 DIAGNOSIS — N189 Chronic kidney disease, unspecified: Secondary | ICD-10-CM | POA: Insufficient documentation

## 2021-11-08 DIAGNOSIS — I129 Hypertensive chronic kidney disease with stage 1 through stage 4 chronic kidney disease, or unspecified chronic kidney disease: Secondary | ICD-10-CM | POA: Insufficient documentation

## 2021-11-08 DIAGNOSIS — R319 Hematuria, unspecified: Secondary | ICD-10-CM | POA: Diagnosis not present

## 2021-11-08 DIAGNOSIS — Z7982 Long term (current) use of aspirin: Secondary | ICD-10-CM | POA: Insufficient documentation

## 2021-11-08 DIAGNOSIS — R531 Weakness: Secondary | ICD-10-CM | POA: Diagnosis not present

## 2021-11-08 DIAGNOSIS — F0390 Unspecified dementia without behavioral disturbance: Secondary | ICD-10-CM | POA: Insufficient documentation

## 2021-11-08 DIAGNOSIS — K8020 Calculus of gallbladder without cholecystitis without obstruction: Secondary | ICD-10-CM | POA: Diagnosis not present

## 2021-11-08 DIAGNOSIS — J449 Chronic obstructive pulmonary disease, unspecified: Secondary | ICD-10-CM | POA: Insufficient documentation

## 2021-11-08 DIAGNOSIS — F32A Depression, unspecified: Secondary | ICD-10-CM | POA: Diagnosis not present

## 2021-11-08 DIAGNOSIS — K625 Hemorrhage of anus and rectum: Secondary | ICD-10-CM | POA: Diagnosis not present

## 2021-11-08 DIAGNOSIS — Z85038 Personal history of other malignant neoplasm of large intestine: Secondary | ICD-10-CM | POA: Diagnosis not present

## 2021-11-08 DIAGNOSIS — I10 Essential (primary) hypertension: Secondary | ICD-10-CM | POA: Diagnosis not present

## 2021-11-08 DIAGNOSIS — I69320 Aphasia following cerebral infarction: Secondary | ICD-10-CM | POA: Diagnosis not present

## 2021-11-08 DIAGNOSIS — R58 Hemorrhage, not elsewhere classified: Secondary | ICD-10-CM | POA: Diagnosis not present

## 2021-11-08 DIAGNOSIS — I69351 Hemiplegia and hemiparesis following cerebral infarction affecting right dominant side: Secondary | ICD-10-CM | POA: Diagnosis not present

## 2021-11-08 DIAGNOSIS — Z9889 Other specified postprocedural states: Secondary | ICD-10-CM | POA: Diagnosis not present

## 2021-11-08 DIAGNOSIS — Z79899 Other long term (current) drug therapy: Secondary | ICD-10-CM | POA: Insufficient documentation

## 2021-11-08 DIAGNOSIS — F0150 Vascular dementia without behavioral disturbance: Secondary | ICD-10-CM | POA: Diagnosis not present

## 2021-11-08 DIAGNOSIS — E1122 Type 2 diabetes mellitus with diabetic chronic kidney disease: Secondary | ICD-10-CM | POA: Diagnosis not present

## 2021-11-08 DIAGNOSIS — E860 Dehydration: Secondary | ICD-10-CM | POA: Diagnosis not present

## 2021-11-08 LAB — CBC WITH DIFFERENTIAL/PLATELET
Abs Immature Granulocytes: 0.08 10*3/uL — ABNORMAL HIGH (ref 0.00–0.07)
Basophils Absolute: 0 10*3/uL (ref 0.0–0.1)
Basophils Relative: 0 %
Eosinophils Absolute: 0.2 10*3/uL (ref 0.0–0.5)
Eosinophils Relative: 2 %
HCT: 41.9 % (ref 39.0–52.0)
Hemoglobin: 14.1 g/dL (ref 13.0–17.0)
Immature Granulocytes: 1 %
Lymphocytes Relative: 18 %
Lymphs Abs: 1.5 10*3/uL (ref 0.7–4.0)
MCH: 26.6 pg (ref 26.0–34.0)
MCHC: 33.7 g/dL (ref 30.0–36.0)
MCV: 79.1 fL — ABNORMAL LOW (ref 80.0–100.0)
Monocytes Absolute: 0.7 10*3/uL (ref 0.1–1.0)
Monocytes Relative: 9 %
Neutro Abs: 5.9 10*3/uL (ref 1.7–7.7)
Neutrophils Relative %: 70 %
Platelets: 278 10*3/uL (ref 150–400)
RBC: 5.3 MIL/uL (ref 4.22–5.81)
RDW: 15 % (ref 11.5–15.5)
WBC: 8.3 10*3/uL (ref 4.0–10.5)
nRBC: 0 % (ref 0.0–0.2)

## 2021-11-08 LAB — COMPREHENSIVE METABOLIC PANEL
ALT: 47 U/L — ABNORMAL HIGH (ref 0–44)
AST: 61 U/L — ABNORMAL HIGH (ref 15–41)
Albumin: 3.4 g/dL — ABNORMAL LOW (ref 3.5–5.0)
Alkaline Phosphatase: 69 U/L (ref 38–126)
Anion gap: 13 (ref 5–15)
BUN: 60 mg/dL — ABNORMAL HIGH (ref 8–23)
CO2: 22 mmol/L (ref 22–32)
Calcium: 9.9 mg/dL (ref 8.9–10.3)
Chloride: 104 mmol/L (ref 98–111)
Creatinine, Ser: 2.65 mg/dL — ABNORMAL HIGH (ref 0.61–1.24)
GFR, Estimated: 23 mL/min — ABNORMAL LOW (ref >60)
Glucose, Bld: 97 mg/dL (ref 70–99)
Potassium: 3.8 mmol/L (ref 3.5–5.1)
Sodium: 139 mmol/L (ref 135–145)
Total Bilirubin: 0.9 mg/dL (ref 0.3–1.2)
Total Protein: 7.8 g/dL (ref 6.5–8.1)

## 2021-11-08 LAB — URINALYSIS, ROUTINE W REFLEX MICROSCOPIC
Bilirubin Urine: NEGATIVE
Glucose, UA: >=500 mg/dL — AB
Ketones, ur: 5 mg/dL — AB
Leukocytes,Ua: NEGATIVE
Nitrite: NEGATIVE
Protein, ur: NEGATIVE mg/dL
Specific Gravity, Urine: 1.008 (ref 1.005–1.030)
pH: 5 (ref 5.0–8.0)

## 2021-11-08 LAB — PROTIME-INR
INR: 1.1 (ref 0.8–1.2)
Prothrombin Time: 14.6 seconds (ref 11.4–15.2)

## 2021-11-08 LAB — TYPE AND SCREEN
ABO/RH(D): O POS
Antibody Screen: NEGATIVE

## 2021-11-08 LAB — ABO/RH: ABO/RH(D): O POS

## 2021-11-08 LAB — POC OCCULT BLOOD, ED: Fecal Occult Bld: NEGATIVE

## 2021-11-08 MED ORDER — PANTOPRAZOLE SODIUM 40 MG IV SOLR
40.0000 mg | Freq: Once | INTRAVENOUS | Status: AC
  Administered 2021-11-08: 40 mg via INTRAVENOUS
  Filled 2021-11-08: qty 10

## 2021-11-08 NOTE — ED Provider Notes (Signed)
?[facility] ?Provider Note ? ? ?CSN: [id number] ?Arrival date & time: 11/08/21  8502 ? ?  ? ?History ? ?Chief Complaint  ?Patient presents with  ? Rectal Bleeding  ? ? ?Ronald Dyer is a 81 y.o. male. ? ? ?Rectal Bleeding ? ?Patient with medical history notable for history of alcohol use disorder, COPD, colon cancer, prior CVA with right-sided deficits, dementia, hypertension, hyperlipidemia, CKD presenting with rectal bleeding.  States it happened this morning, he had "dark red" blood in his bowel movement.  He also reports feeling weak generally, not on blood thinners.  He denies any pain. ? ?Patient's wife is at bedside providing additional history.  She noticed bright red blood and clots in the toilet, unsure if rectal or from the urine.  She states she has been "going downhill" for some time now, was seen on 3/11 for generalized weakness and fall but was discharged from the hospital at that time.  She states she takes a baby aspirin but not on blood thinners, he has been having generalized weakness and he lives at home with her.  He ambulates with a walker but not well, she states she is considered looking at homes but does not feel comfortable. ? ?Home Medications ?Prior to Admission medications   ?Medication Sig Start Date End Date Taking? Authorizing Provider  ?aspirin 81 MG tablet Take 81 mg by mouth daily.   Yes [provider]  ?atorvastatin (LIPITOR) 10 MG tablet Take 10 mg by mouth See admin instructions. Tuesday & Thursday   Yes [provider]  ?donepezil (ARICEPT) 10 MG tablet TAKE ONE TABLET BY MOUTH EVERYDAY AT BEDTIME ?Patient taking differently: Take 10 mg by mouth at bedtime. 09/27/21  Yes Ward Givens, NP  ?empagliflozin (JARDIANCE) 25 MG TABS tablet Take 25 mg by mouth daily.   Yes [provider]  ?glimepiride (AMARYL) 4 MG tablet Take 4 mg by mouth daily with breakfast. 10/16/11  Yes [provider]  ?glipiZIDE  (GLUCOTROL XL) 5 MG 24 hr tablet Take 5 mg by mouth every morning. 09/19/21  Yes [provider]  ?hydrochlorothiazide (HYDRODIURIL) 25 MG tablet Take 25 mg by mouth every morning. 10/21/20  Yes [provider]  ?lisinopril (ZESTRIL) 20 MG tablet Take 20 mg by mouth daily. 09/29/21  Yes [provider]  ?memantine (NAMENDA) 5 MG tablet Take 5 mg by mouth daily. 04/05/21  Yes [provider]  ?Multiple Vitamins-Minerals (MULTIVITAMIN WITH MINERALS) tablet Take 1 tablet by mouth daily.   Yes [provider]  ?pantoprazole (PROTONIX) 40 MG tablet Take 1 tablet (40 mg total) by mouth daily at 6 (six) AM. 02/07/20  Yes Hosie Poisson, MD  ?pioglitazone (ACTOS) 45 MG tablet Take 45 mg by mouth daily.   Yes [provider]  ?sertraline (ZOLOFT) 100 MG tablet Take 100 mg by mouth daily.   Yes [provider]  ?tamsulosin (FLOMAX) 0.4 MG CAPS capsule Take 0.4 mg by mouth daily.   Yes [provider]  ?vitamin B-12 (CYANOCOBALAMIN) 100 MCG tablet Take 50 mcg by mouth daily.   Yes [provider]  ?ACCU-CHEK SMARTVIEW test strip  03/21/21   [provider]  ?memantine (NAMENDA TITRATION PAK) tablet pack 5 mg/day for =1 week; 5 mg twice daily for =1 week; 15 mg/day given in 5 mg and 10 mg separated doses for =1 week; then 10 mg twice daily ?Patient not taking: Reported on 11/08/2021 04/05/21   Ward Givens, NP  ?   ? ?  Allergies    ?Metformin hcl and Simvastatin   ? ?Review of Systems   ?Review of Systems  ?Gastrointestinal:  Positive for hematochezia.  ? ?Physical Exam ?Updated Vital Signs ?BP 115/62   Pulse 69   Temp 98.5 ?F (36.9 ?C) (Oral)   Resp 15   Ht 6' (1.829 m)   Wt 81.6 kg   SpO2 97%   BMI 24.41 kg/m?  ?Physical Exam ?Vitals and nursing note reviewed. Exam conducted with a chaperone present.  ?Constitutional:   ?   General: He is not in acute distress. ?   Appearance: Normal appearance.  ?   Comments: Cachectic but not toxic  looking  ?HENT:  ?   Head: Normocephalic and atraumatic.  ?   Mouth/Throat:  ?   Mouth: Mucous membranes are dry.  ?Eyes:  ?   General: No scleral icterus. ?   Extraocular Movements: Extraocular movements intact.  ?   Pupils: Pupils are equal, round, and reactive to light.  ?Cardiovascular:  ?   Rate and Rhythm: Normal rate and regular rhythm.  ?Abdominal:  ?   General: Abdomen is flat.  ?   Tenderness: There is abdominal tenderness. There is guarding.  ?   Comments: Diffuse tenderness with voluntary guarding.    ?Skin: ?   Coloration: Skin is not jaundiced.  ?Neurological:  ?   Mental Status: He is alert. Mental status is at baseline.  ?   Coordination: Coordination normal.  ?   Comments: Generalized weakness but roughly symmetric.  Some expressive aphasia which apparently is baseline  ? ? ?ED Results / Procedures / Treatments   ?Labs ?(all labs ordered are listed, but only abnormal results are displayed) ?Labs Reviewed  ?COMPREHENSIVE METABOLIC PANEL - Abnormal; Notable for the following components:  ?    Result Value  ? BUN 60 (*)   ? Creatinine, Ser 2.65 (*)   ? Albumin 3.4 (*)   ? AST 61 (*)   ? ALT 47 (*)   ? GFR, Estimated 23 (*)   ? All other components within normal limits  ?CBC WITH DIFFERENTIAL/PLATELET - Abnormal; Notable for the following components:  ? MCV 79.1 (*)   ? Abs Immature Granulocytes 0.08 (*)   ? All other components within normal limits  ?URINALYSIS, ROUTINE W REFLEX MICROSCOPIC - Abnormal; Notable for the following components:  ? Glucose, UA >=500 (*)   ? Hgb urine dipstick MODERATE (*)   ? Ketones, ur 5 (*)   ? Bacteria, UA RARE (*)   ? All other components within normal limits  ?URINE CULTURE  ?PROTIME-INR  ?POC OCCULT BLOOD, ED  ?TYPE AND SCREEN  ?ABO/RH  ? ? ?EKG ?None ? ?Radiology ?CT Renal Stone Study ? ?Result Date: 11/08/2021 ?CLINICAL DATA:  Flank pain, concern for nephrolithiasis EXAM: CT ABDOMEN AND PELVIS WITHOUT CONTRAST TECHNIQUE: Multidetector CT imaging of the abdomen and  pelvis was performed following the standard protocol without IV contrast. RADIATION DOSE REDUCTION: This exam was performed according to the departmental dose-optimization program which includes automated exposure control, adjustment of the mA and/or kV according to patient size and/or use of iterative reconstruction technique. COMPARISON:  08/02/2020 FINDINGS: Lower chest: Similar bibasilar interstitial changes and bandlike scarring. Native coronary atherosclerosis. Normal heart size. No pericardial or pleural effusion. Hepatobiliary: Limited without IV contrast. No large focal hepatic abnormality. Small calcified layering gallstones noted. Common bile duct nondilated. Pancreas: Unremarkable. No pancreatic ductal dilatation or surrounding inflammatory changes. Spleen: Normal in size without focal abnormality.  Adrenals/Urinary Tract: Stable slight thickening of the adrenal glands bilaterally. No renal obstruction or hydronephrosis. no associated hydroureter or ureteral calculus. No acute obstructive uropathy. Urinary bladder unremarkable. Prostate gland markedly enlarged as before measuring 8 cm in diameter, image 91/3. Stomach/Bowel: Negative for bowel obstruction, significant dilatation, ileus, or free air. Postop changes from right hemicolectomy. No free fluid, fluid collection, hemorrhage, hematoma, abscess or ascites. Vascular/Lymphatic: Limited without IV contrast. Aorta atherosclerotic. Negative for aneurysm. No bulky adenopathy. Reproductive: Marked prostate enlargement similar to the prior exam. Other: No abdominal wall hernia or abnormality. No abdominopelvic ascites. Musculoskeletal: Degenerative changes throughout the spine. No acute osseous finding. IMPRESSION: No acute obstructing urinary tract or ureteral calculus. Negative for acute obstructive uropathy or hydronephrosis. Overall stable appearance of the urinary tract. No other acute intra-abdominal or pelvic finding by noncontrast CT. Cholelithiasis  Remote right hemicolectomy Chronic prostate enlargement. Aortic Atherosclerosis (ICD10-I70.0). Electronically Signed   By: Jerilynn Mages.  Shick M.D.   On: 11/08/2021 09:30   ? ?Procedures ?Procedures  ? ? ?Medications Ordered i

## 2021-11-08 NOTE — ED Notes (Signed)
Pt transported to CT at this time.

## 2021-11-08 NOTE — ED Notes (Signed)
Reviewed discharge instructions with patient and spouse. Follow-up care reviewed. Patient and spouse verbalized understanding. Patient A&Ox4, VSS upon discharge. 

## 2021-11-08 NOTE — ED Notes (Addendum)
Type and screen redrawn at 0759 and sent to blood bank ?

## 2021-11-08 NOTE — ED Triage Notes (Addendum)
[facility] BIB by [facility] from [facility] for blood in his stool . Bright red blood.  Denies N/V/D or abdominal pain . H/c diabetes and dementia . Denies being being rx blood thinners .  ?

## 2021-11-08 NOTE — Discharge Instructions (Signed)
Work-up today was reassuring, you did have some blood in your urine but there is no evidence of infection or obstruction.  Please follow-up with Dr. Diona Fanti this week, call the office after discharge. ?

## 2021-11-08 NOTE — ED Notes (Signed)
Pt reported needing to urinate. Went to help pt urinate and pulled down his depend and noted blood in depend coming form his penis w/ a blood clot on the tip of his penis. Notified Sage PA. Pt unable to provide urine specimen, notified Sage PA as well. ?

## 2021-11-09 LAB — URINE CULTURE: Culture: NO GROWTH

## 2021-11-11 DIAGNOSIS — R310 Gross hematuria: Secondary | ICD-10-CM | POA: Diagnosis not present

## 2021-11-12 ENCOUNTER — Inpatient Hospital Stay (HOSPITAL_COMMUNITY)
Admission: EM | Admit: 2021-11-12 | Discharge: 2021-11-17 | DRG: 683 | Disposition: A | Discharge status: Skilled Nursing Facility | Payer: Medicare HMO | Attending: Internal Medicine | Admitting: Internal Medicine

## 2021-11-12 ENCOUNTER — Encounter (HOSPITAL_COMMUNITY): Payer: Self-pay

## 2021-11-12 ENCOUNTER — Other Ambulatory Visit: Payer: Self-pay

## 2021-11-12 DIAGNOSIS — M6281 Muscle weakness (generalized): Secondary | ICD-10-CM | POA: Diagnosis not present

## 2021-11-12 DIAGNOSIS — M6259 Muscle wasting and atrophy, not elsewhere classified, multiple sites: Secondary | ICD-10-CM | POA: Diagnosis not present

## 2021-11-12 DIAGNOSIS — E860 Dehydration: Secondary | ICD-10-CM | POA: Diagnosis present

## 2021-11-12 DIAGNOSIS — K8020 Calculus of gallbladder without cholecystitis without obstruction: Secondary | ICD-10-CM | POA: Diagnosis present

## 2021-11-12 DIAGNOSIS — I69351 Hemiplegia and hemiparesis following cerebral infarction affecting right dominant side: Secondary | ICD-10-CM | POA: Diagnosis not present

## 2021-11-12 DIAGNOSIS — D500 Iron deficiency anemia secondary to blood loss (chronic): Secondary | ICD-10-CM | POA: Diagnosis present

## 2021-11-12 DIAGNOSIS — F0150 Vascular dementia without behavioral disturbance: Secondary | ICD-10-CM | POA: Diagnosis present

## 2021-11-12 DIAGNOSIS — E119 Type 2 diabetes mellitus without complications: Secondary | ICD-10-CM | POA: Diagnosis not present

## 2021-11-12 DIAGNOSIS — I959 Hypotension, unspecified: Secondary | ICD-10-CM | POA: Diagnosis not present

## 2021-11-12 DIAGNOSIS — C189 Malignant neoplasm of colon, unspecified: Secondary | ICD-10-CM | POA: Diagnosis present

## 2021-11-12 DIAGNOSIS — R319 Hematuria, unspecified: Secondary | ICD-10-CM | POA: Diagnosis present

## 2021-11-12 DIAGNOSIS — Z20822 Contact with and (suspected) exposure to covid-19: Secondary | ICD-10-CM | POA: Diagnosis present

## 2021-11-12 DIAGNOSIS — T502X5A Adverse effect of carbonic-anhydrase inhibitors, benzothiadiazides and other diuretics, initial encounter: Secondary | ICD-10-CM | POA: Diagnosis present

## 2021-11-12 DIAGNOSIS — R531 Weakness: Secondary | ICD-10-CM | POA: Diagnosis not present

## 2021-11-12 DIAGNOSIS — Z888 Allergy status to other drugs, medicaments and biological substances status: Secondary | ICD-10-CM

## 2021-11-12 DIAGNOSIS — F32A Depression, unspecified: Secondary | ICD-10-CM | POA: Diagnosis present

## 2021-11-12 DIAGNOSIS — N400 Enlarged prostate without lower urinary tract symptoms: Secondary | ICD-10-CM | POA: Diagnosis present

## 2021-11-12 DIAGNOSIS — Z85038 Personal history of other malignant neoplasm of large intestine: Secondary | ICD-10-CM | POA: Diagnosis not present

## 2021-11-12 DIAGNOSIS — R0902 Hypoxemia: Secondary | ICD-10-CM | POA: Diagnosis not present

## 2021-11-12 DIAGNOSIS — M6282 Rhabdomyolysis: Secondary | ICD-10-CM | POA: Diagnosis not present

## 2021-11-12 DIAGNOSIS — E440 Moderate protein-calorie malnutrition: Secondary | ICD-10-CM | POA: Diagnosis present

## 2021-11-12 DIAGNOSIS — R41841 Cognitive communication deficit: Secondary | ICD-10-CM | POA: Diagnosis not present

## 2021-11-12 DIAGNOSIS — E11319 Type 2 diabetes mellitus with unspecified diabetic retinopathy without macular edema: Secondary | ICD-10-CM | POA: Diagnosis present

## 2021-11-12 DIAGNOSIS — R739 Hyperglycemia, unspecified: Secondary | ICD-10-CM | POA: Diagnosis not present

## 2021-11-12 DIAGNOSIS — J449 Chronic obstructive pulmonary disease, unspecified: Secondary | ICD-10-CM | POA: Diagnosis present

## 2021-11-12 DIAGNOSIS — K219 Gastro-esophageal reflux disease without esophagitis: Secondary | ICD-10-CM | POA: Diagnosis present

## 2021-11-12 DIAGNOSIS — Z809 Family history of malignant neoplasm, unspecified: Secondary | ICD-10-CM

## 2021-11-12 DIAGNOSIS — N179 Acute kidney failure, unspecified: Principal | ICD-10-CM | POA: Diagnosis present

## 2021-11-12 DIAGNOSIS — E7800 Pure hypercholesterolemia, unspecified: Secondary | ICD-10-CM | POA: Diagnosis present

## 2021-11-12 DIAGNOSIS — K625 Hemorrhage of anus and rectum: Secondary | ICD-10-CM | POA: Diagnosis present

## 2021-11-12 DIAGNOSIS — I129 Hypertensive chronic kidney disease with stage 1 through stage 4 chronic kidney disease, or unspecified chronic kidney disease: Secondary | ICD-10-CM | POA: Diagnosis present

## 2021-11-12 DIAGNOSIS — Z9049 Acquired absence of other specified parts of digestive tract: Secondary | ICD-10-CM

## 2021-11-12 DIAGNOSIS — E1122 Type 2 diabetes mellitus with diabetic chronic kidney disease: Secondary | ICD-10-CM | POA: Diagnosis present

## 2021-11-12 DIAGNOSIS — R1312 Dysphagia, oropharyngeal phase: Secondary | ICD-10-CM | POA: Diagnosis not present

## 2021-11-12 DIAGNOSIS — K5900 Constipation, unspecified: Secondary | ICD-10-CM | POA: Diagnosis present

## 2021-11-12 DIAGNOSIS — Z7401 Bed confinement status: Secondary | ICD-10-CM | POA: Diagnosis not present

## 2021-11-12 DIAGNOSIS — Z7982 Long term (current) use of aspirin: Secondary | ICD-10-CM

## 2021-11-12 DIAGNOSIS — E1169 Type 2 diabetes mellitus with other specified complication: Secondary | ICD-10-CM

## 2021-11-12 DIAGNOSIS — Z8673 Personal history of transient ischemic attack (TIA), and cerebral infarction without residual deficits: Secondary | ICD-10-CM | POA: Diagnosis not present

## 2021-11-12 DIAGNOSIS — N1832 Chronic kidney disease, stage 3b: Secondary | ICD-10-CM | POA: Diagnosis present

## 2021-11-12 DIAGNOSIS — R509 Fever, unspecified: Secondary | ICD-10-CM | POA: Diagnosis not present

## 2021-11-12 DIAGNOSIS — Z6826 Body mass index (BMI) 26.0-26.9, adult: Secondary | ICD-10-CM

## 2021-11-12 DIAGNOSIS — Z66 Do not resuscitate: Secondary | ICD-10-CM | POA: Diagnosis present

## 2021-11-12 DIAGNOSIS — I69359 Hemiplegia and hemiparesis following cerebral infarction affecting unspecified side: Secondary | ICD-10-CM

## 2021-11-12 DIAGNOSIS — Z7984 Long term (current) use of oral hypoglycemic drugs: Secondary | ICD-10-CM

## 2021-11-12 DIAGNOSIS — I10 Essential (primary) hypertension: Secondary | ICD-10-CM | POA: Diagnosis not present

## 2021-11-12 DIAGNOSIS — R2681 Unsteadiness on feet: Secondary | ICD-10-CM | POA: Diagnosis not present

## 2021-11-12 DIAGNOSIS — E876 Hypokalemia: Secondary | ICD-10-CM | POA: Diagnosis present

## 2021-11-12 DIAGNOSIS — Z79899 Other long term (current) drug therapy: Secondary | ICD-10-CM

## 2021-11-12 DIAGNOSIS — R2689 Other abnormalities of gait and mobility: Secondary | ICD-10-CM | POA: Diagnosis not present

## 2021-11-12 LAB — COMPREHENSIVE METABOLIC PANEL
ALT: 31 U/L (ref 0–44)
AST: 27 U/L (ref 15–41)
Albumin: 3.2 g/dL — ABNORMAL LOW (ref 3.5–5.0)
Alkaline Phosphatase: 60 U/L (ref 38–126)
Anion gap: 11 (ref 5–15)
BUN: 49 mg/dL — ABNORMAL HIGH (ref 8–23)
CO2: 22 mmol/L (ref 22–32)
Calcium: 8.9 mg/dL (ref 8.9–10.3)
Chloride: 103 mmol/L (ref 98–111)
Creatinine, Ser: 3.05 mg/dL — ABNORMAL HIGH (ref 0.61–1.24)
GFR, Estimated: 20 mL/min — ABNORMAL LOW (ref >60)
Glucose, Bld: 173 mg/dL — ABNORMAL HIGH (ref 70–99)
Potassium: 3.7 mmol/L (ref 3.5–5.1)
Sodium: 136 mmol/L (ref 135–145)
Total Bilirubin: 0.8 mg/dL (ref 0.3–1.2)
Total Protein: 6.9 g/dL (ref 6.5–8.1)

## 2021-11-12 LAB — CBC WITH DIFFERENTIAL/PLATELET
Abs Immature Granulocytes: 0.03 10*3/uL (ref 0.00–0.07)
Basophils Absolute: 0 10*3/uL (ref 0.0–0.1)
Basophils Relative: 0 %
Eosinophils Absolute: 0.1 10*3/uL (ref 0.0–0.5)
Eosinophils Relative: 1 %
HCT: 37.3 % — ABNORMAL LOW (ref 39.0–52.0)
Hemoglobin: 12.4 g/dL — ABNORMAL LOW (ref 13.0–17.0)
Immature Granulocytes: 0 %
Lymphocytes Relative: 11 %
Lymphs Abs: 1.1 10*3/uL (ref 0.7–4.0)
MCH: 26.6 pg (ref 26.0–34.0)
MCHC: 33.2 g/dL (ref 30.0–36.0)
MCV: 79.9 fL — ABNORMAL LOW (ref 80.0–100.0)
Monocytes Absolute: 0.7 10*3/uL (ref 0.1–1.0)
Monocytes Relative: 8 %
Neutro Abs: 7.7 10*3/uL (ref 1.7–7.7)
Neutrophils Relative %: 80 %
Platelets: 215 10*3/uL (ref 150–400)
RBC: 4.67 MIL/uL (ref 4.22–5.81)
RDW: 15.2 % (ref 11.5–15.5)
WBC: 9.6 10*3/uL (ref 4.0–10.5)
nRBC: 0 % (ref 0.0–0.2)

## 2021-11-12 LAB — POC OCCULT BLOOD, ED: Fecal Occult Bld: NEGATIVE

## 2021-11-12 LAB — LIPASE, BLOOD: Lipase: 62 U/L — ABNORMAL HIGH (ref 11–51)

## 2021-11-12 MED ORDER — HEPARIN SODIUM (PORCINE) 5000 UNIT/ML IJ SOLN
5000.0000 [IU] | Freq: Three times a day (TID) | INTRAMUSCULAR | Status: DC
  Administered 2021-11-12 – 2021-11-17 (×14): 5000 [IU] via SUBCUTANEOUS
  Filled 2021-11-12 (×14): qty 1

## 2021-11-12 MED ORDER — ASPIRIN 81 MG PO CHEW
81.0000 mg | CHEWABLE_TABLET | ORAL | Status: DC
  Administered 2021-11-13 – 2021-11-17 (×3): 81 mg via ORAL
  Filled 2021-11-12 (×4): qty 1

## 2021-11-12 MED ORDER — VITAMIN B-12 100 MCG PO TABS
50.0000 ug | ORAL_TABLET | Freq: Every day | ORAL | Status: DC
  Administered 2021-11-13 – 2021-11-17 (×5): 100 ug via ORAL
  Filled 2021-11-12 (×5): qty 1

## 2021-11-12 MED ORDER — SODIUM CHLORIDE 0.9 % IV SOLN: INTRAVENOUS | Status: AC

## 2021-11-12 MED ORDER — SODIUM CHLORIDE 0.9 % IV BOLUS
1000.0000 mL | Freq: Once | INTRAVENOUS | Status: AC
  Administered 2021-11-12: 1000 mL via INTRAVENOUS

## 2021-11-12 MED ORDER — ACETAMINOPHEN 650 MG RE SUPP: 650.0000 mg | Freq: Four times a day (QID) | RECTAL | Status: DC | PRN

## 2021-11-12 MED ORDER — DONEPEZIL HCL 5 MG PO TABS
10.0000 mg | ORAL_TABLET | Freq: Every day | ORAL | Status: DC
  Administered 2021-11-12 – 2021-11-16 (×5): 10 mg via ORAL
  Filled 2021-11-12 (×5): qty 2

## 2021-11-12 MED ORDER — ATORVASTATIN CALCIUM 10 MG PO TABS
10.0000 mg | ORAL_TABLET | ORAL | Status: DC
  Administered 2021-11-15 – 2021-11-17 (×2): 10 mg via ORAL
  Filled 2021-11-12 (×2): qty 1

## 2021-11-12 MED ORDER — SERTRALINE HCL 50 MG PO TABS
100.0000 mg | ORAL_TABLET | Freq: Every day | ORAL | Status: DC
  Administered 2021-11-13 – 2021-11-17 (×5): 100 mg via ORAL
  Filled 2021-11-12 (×5): qty 2

## 2021-11-12 MED ORDER — TAMSULOSIN HCL 0.4 MG PO CAPS
0.4000 mg | ORAL_CAPSULE | Freq: Every day | ORAL | Status: DC
  Administered 2021-11-13 – 2021-11-17 (×5): 0.4 mg via ORAL
  Filled 2021-11-12 (×5): qty 1

## 2021-11-12 MED ORDER — PANTOPRAZOLE SODIUM 40 MG PO TBEC
40.0000 mg | DELAYED_RELEASE_TABLET | Freq: Every day | ORAL | Status: DC
  Administered 2021-11-13 – 2021-11-17 (×5): 40 mg via ORAL
  Filled 2021-11-12 (×5): qty 1

## 2021-11-12 MED ORDER — ACETAMINOPHEN 325 MG PO TABS
650.0000 mg | ORAL_TABLET | Freq: Four times a day (QID) | ORAL | Status: DC | PRN
  Administered 2021-11-12: 650 mg via ORAL
  Filled 2021-11-12: qty 2

## 2021-11-12 MED ORDER — HYDRALAZINE HCL 20 MG/ML IJ SOLN: 10.0000 mg | INTRAMUSCULAR | Status: DC | PRN

## 2021-11-12 NOTE — ED Provider Notes (Signed)
?[facility] DEPT ?Provider Note ? ? ?CSN: [id number] ?Arrival date & time: 11/12/21  1856 ? ?  ? ?History ? ?Chief Complaint  ?Patient presents with  ? Rectal Bleeding  ? Weakness  ? Abdominal Pain  ? ? ?Ronald Dyer is a 81 y.o. male. ? ?Patient presents with family chief complaint of increasing generalized fatigue and weakness, continued bleeding from possible rectum versus urine.  Also complaining of intermittent abdominal pain ongoing for the past week.  Family states he was seen at [facility] for similar episode 4 days ago and had a work-up including CT imaging that was unremarkable and ultimately discharged home.  He has not improved at home and brought back to the ER today.  No reports of fevers no cough no vomiting no diarrhea. ? ? ?  ? ?Home Medications ?Prior to Admission medications   ?Medication Sig Start Date End Date Taking? Authorizing Provider  ?ACCU-CHEK SMARTVIEW test strip  03/21/21   [provider]  ?aspirin 81 MG tablet Take 81 mg by mouth daily.    [provider]  ?atorvastatin (LIPITOR) 10 MG tablet Take 10 mg by mouth See admin instructions. Tuesday & Thursday    [provider]  ?donepezil (ARICEPT) 10 MG tablet TAKE ONE TABLET BY MOUTH EVERYDAY AT BEDTIME ?Patient taking differently: Take 10 mg by mouth at bedtime. 09/27/21   Ward Givens, NP  ?empagliflozin (JARDIANCE) 25 MG TABS tablet Take 25 mg by mouth daily.    [provider]  ?glimepiride (AMARYL) 4 MG tablet Take 4 mg by mouth daily with breakfast. 10/16/11   [provider]  ?glipiZIDE (GLUCOTROL XL) 5 MG 24 hr tablet Take 5 mg by mouth every morning. 09/19/21   [provider]  ?hydrochlorothiazide (HYDRODIURIL) 25 MG tablet Take 25 mg by mouth every morning. 10/21/20   [provider]  ?lisinopril (ZESTRIL) 20 MG tablet Take 20 mg by mouth daily. 09/29/21   [provider]  ?memantine (NAMENDA TITRATION PAK) tablet pack 5 mg/day  for =1 week; 5 mg twice daily for =1 week; 15 mg/day given in 5 mg and 10 mg separated doses for =1 week; then 10 mg twice daily ?Patient not taking: Reported on 11/08/2021 04/05/21   Ward Givens, NP  ?memantine (NAMENDA) 5 MG tablet Take 5 mg by mouth daily. 04/05/21   [provider]  ?Multiple Vitamins-Minerals (MULTIVITAMIN WITH MINERALS) tablet Take 1 tablet by mouth daily.    [provider]  ?pantoprazole (PROTONIX) 40 MG tablet Take 1 tablet (40 mg total) by mouth daily at 6 (six) AM. 02/07/20   Hosie Poisson, MD  ?pioglitazone (ACTOS) 45 MG tablet Take 45 mg by mouth daily.    [provider]  ?sertraline (ZOLOFT) 100 MG tablet Take 100 mg by mouth daily.    [provider]  ?tamsulosin (FLOMAX) 0.4 MG CAPS capsule Take 0.4 mg by mouth daily.    [provider]  ?vitamin B-12 (CYANOCOBALAMIN) 100 MCG tablet Take 50 mcg by mouth daily.    [provider]  ?   ? ?Allergies    ?Metformin hcl and Simvastatin   ? ?Review of Systems   ?Review of Systems  ?Constitutional:  Negative for fever.  ?HENT:  Negative for ear pain and sore throat.   ?Eyes:  Negative for pain.  ?Respiratory:  Negative for cough.   ?Cardiovascular:  Negative for chest pain.  ?Gastrointestinal:  Positive for abdominal pain.  ?Genitourinary:  Negative for  flank pain.  ?Musculoskeletal:  Negative for back pain.  ?Skin:  Negative for color change and rash.  ?Neurological:  Negative for syncope.  ?All other systems reviewed and are negative. ? ?Physical Exam ?Updated Vital Signs ?BP (!) 105/56   Pulse 60   Temp (!) 97.4 ?F (36.3 ?C) (Oral)   Resp 16   Ht 6' (1.829 m)   Wt 81.6 kg   SpO2 98%   BMI 24.41 kg/m?  ?Physical Exam ?Constitutional:   ?   Appearance: He is well-developed.  ?HENT:  ?   Head: Normocephalic.  ?   Nose: Nose normal.  ?Eyes:  ?   Extraocular Movements: Extraocular movements intact.  ?Cardiovascular:  ?   Rate and Rhythm: Normal rate.  ?Pulmonary:  ?   Effort:  Pulmonary effort is normal.  ?Abdominal:  ?   Tenderness: There is generalized abdominal tenderness.  ?Skin: ?   Coloration: Skin is not jaundiced.  ?Neurological:  ?   Mental Status: He is alert. Mental status is at baseline.  ? ? ?ED Results / Procedures / Treatments   ?Labs ?(all labs ordered are listed, but only abnormal results are displayed) ?Labs Reviewed  ?CBC WITH DIFFERENTIAL/PLATELET - Abnormal; Notable for the following components:  ?    Result Value  ? Hemoglobin 12.4 (*)   ? HCT 37.3 (*)   ? MCV 79.9 (*)   ? All other components within normal limits  ?COMPREHENSIVE METABOLIC PANEL - Abnormal; Notable for the following components:  ? Glucose, Bld 173 (*)   ? BUN 49 (*)   ? Creatinine, Ser 3.05 (*)   ? Albumin 3.2 (*)   ? GFR, Estimated 20 (*)   ? All other components within normal limits  ?LIPASE, BLOOD - Abnormal; Notable for the following components:  ? Lipase 62 (*)   ? All other components within normal limits  ?URINALYSIS, ROUTINE W REFLEX MICROSCOPIC  ?POC OCCULT BLOOD, ED  ? ? ?EKG ?None ? ?Radiology ?No results found. ? ?Procedures ?Procedures  ? ? ?Medications Ordered in ED ?Medications  ?sodium chloride 0.9 % bolus 1,000 mL (has no administration in time range)  ? ? ?ED Course/ Medical Decision Making/ A&P ?  ?                        ?Medical Decision Making ?Amount and/or Complexity of Data Reviewed ?Labs: ordered. ? ? ?Review of records shows prior ER visit on November 11, 2021.  Hematuria abdominal pain discharged home. ? ?History obtained from wife who is at bedside. ? ?Cardiac monitoring shows sinus rhythm. ? ?Comorbidities influencing complexity include patient's history of stroke and memory loss. ? ?Labs are sent.  White count of 9 hemoglobin 12 chemistry shows creatinine of 3.  BUN of 49.  This is elevated beyond his baseline of around 1.5. ? ?Patient given a liter bolus of fluids, will be brought into the hospitalist team for further evaluation.  Urinalysis ordered and still  pending. ? ? ? ? ? ? ? ?Final Clinical Impression(s) / ED Diagnoses ?Final diagnoses:  ?Generalized weakness  ?AKI (acute kidney injury) ([place])  ? ? ?Rx / DC Orders ?ED Discharge Orders   ? ? None  ? ?  ? ? ?  ?Luna Fuse, MD ?11/12/21 2105 ? ?

## 2021-11-12 NOTE — ED Triage Notes (Signed)
Per EMS, Pt, from home, c/o rectal bleeding, increasing weakness, and intermittent generalized abdominal pain x 1 week.  Pt was seen at [facility] on 3/21 for same.  ?

## 2021-11-12 NOTE — H&P (Signed)
?History and Physical  ? ? ?[name] [place] FMB:[id number] DOB: [birth year] DOA: 11/12/2021 ? ?PCP: [name], MD  ?Patient coming from: Home. ? ?History obtained from patient's wife.  Patient has advanced dementia. ? ?Chief Complaint: Low blood pressure. ? ?HPI: Ronald Dyer is a 81 y.o. male with history of advanced dementia uses a walker to ambulate, hypertension, diabetes mellitus type 2, chronic kidney disease stage III, history of colon cancer status post right hemicolectomy was brought to the ER because patient was found to have a low blood pressure.  Patient was in the ER about 4 days ago for concern for rectal bleeding at that time it was noted that patient actually had hematuria and was referred to her urologist.  Per patient's wife who provided the history patient did go to urologist 2 days ago and had procedure likely could be cystoscopy and was told that there was no active bleeding.  Has follow-up with urologist.  Over the last 2 days patient's wife noticed that patient blood pressure was less than 90 systolic.  Today when patient's home health aide came and checked her blood pressure was low and patient was brought to the ER.  Per patient's wife patient has been having poor appetite and decreased bowel movements.  Did not have any nausea vomiting chest pain shortness of breath.  Had come to the ER about 2 weeks ago for a fall at that time CT head C-spine x-rays were unrevealing. ? ?ED Course: In the ER patient blood pressure was around 701 systolic.  Labs show progressive worsening renal function.  Creatinine on June 2021 was 1.5 it has progressively worsened to 1.9 in January 2023 and last week it was 2.6 now it is 3.05.  Hemoglobin also dropped by 2 g from last week.  Patient was given 1 L fluid bolus admitted for acute renal failure.  COVID test is pending. ? ?Review of Systems: As per HPI, rest all negative. ? ? ?Past Medical History:  ?Diagnosis Date  ? Atherosclerosis of aorta ([place])   ?  CKD (chronic kidney disease), stage III ([place])   ? COPD (chronic obstructive pulmonary disease) ([place])   ? Depression   ? Diabetes mellitus without complication ([place])   ? Diabetic retinopathy ([place])   ? Frequent falls   ? GERD (gastroesophageal reflux disease)   ? History of rhabdomyolysis   ? Hypercholesterolemia   ? Hypertension   ? Memory loss   ? Personal history of other malignant neoplasm of large intestine   ? stg 2, Dr Alen Blew d/c 06/2014   ? Stroke [facility])   ? ? ?Past Surgical History:  ?Procedure Laterality Date  ? cataracts Bilateral 2011  ? COLONOSCOPY WITH PROPOFOL N/A 11/09/2020  ? Procedure: COLONOSCOPY WITH PROPOFOL;  Surgeon: Wilford Corner, MD;  Location: [facility] ENDOSCOPY;  Service: Endoscopy;  Laterality: N/A;  Flex Sig-poor prep  ? ESOPHAGOGASTRODUODENOSCOPY (EGD) WITH PROPOFOL N/A 11/09/2020  ? Procedure: ESOPHAGOGASTRODUODENOSCOPY (EGD) WITH PROPOFOL;  Surgeon: Wilford Corner, MD;  Location: [facility] ENDOSCOPY;  Service: Endoscopy;  Laterality: N/A;  ? HEMICOLECTOMY Right 03/05/2009  ? UMBILICAL HERNIA REPAIR  03/05/2009  ? ? ? reports that he quit smoking about 23 years ago. His smoking use included cigarettes. He has quit using smokeless tobacco. He reports that he does not drink alcohol and does not use drugs. ? ?Allergies  ?Allergen Reactions  ? Metformin Hcl Diarrhea  ? Simvastatin Other (See Comments)  ?  High CPK   ? ? ?Family History  ?Problem  Relation Age of Onset  ? Cancer Father   ? ? ?Prior to Admission medications   ?Medication Sig Start Date End Date Taking? Authorizing Provider  ?aspirin 81 MG tablet Take 81 mg by mouth every other day.   Yes [provider]  ?atorvastatin (LIPITOR) 10 MG tablet Take 10 mg by mouth See admin instructions. Take 10 mg by mouth on Tuesday(s) and Thursday(s)   Yes [provider]  ?donepezil (ARICEPT) 10 MG tablet TAKE ONE TABLET BY MOUTH EVERYDAY AT BEDTIME ?Patient taking differently: Take 10 mg by mouth at bedtime. 09/27/21  Yes Ward Givens, NP  ?hydrochlorothiazide (HYDRODIURIL) 25 MG tablet Take 25 mg by mouth every morning. 10/21/20  Yes [provider]  ?JARDIANCE 25 MG TABS tablet Take 25 mg by mouth daily.   Yes [provider]  ?lisinopril (ZESTRIL) 20 MG tablet Take 20 mg by mouth daily. 09/29/21  Yes [provider]  ?Multiple Vitamins-Minerals (MULTIVITAMIN WITH MINERALS) tablet Take 1 tablet by mouth daily.   Yes [provider]  ?pantoprazole (PROTONIX) 40 MG tablet Take 1 tablet (40 mg total) by mouth daily at 6 (six) AM. 02/07/20  Yes Hosie Poisson, MD  ?pioglitazone (ACTOS) 45 MG tablet Take 45 mg by mouth daily.   Yes [provider]  ?sertraline (ZOLOFT) 100 MG tablet Take 100 mg by mouth daily.   Yes [provider]  ?tamsulosin (FLOMAX) 0.4 MG CAPS capsule Take 0.4 mg by mouth daily.   Yes [provider]  ?vitamin B-12 (CYANOCOBALAMIN) 100 MCG tablet Take 50-100 mcg by mouth daily.   Yes [provider]  ?ACCU-CHEK SMARTVIEW test strip  03/21/21   [provider]  ?glimepiride (AMARYL) 4 MG tablet Take 4 mg by mouth daily with breakfast. 10/16/11   [provider]  ?glipiZIDE (GLUCOTROL XL) 5 MG 24 hr tablet Take 5 mg by mouth every morning. 09/19/21   [provider]  ?memantine (NAMENDA TITRATION PAK) tablet pack 5 mg/day for =1 week; 5 mg twice daily for =1 week; 15 mg/day given in 5 mg and 10 mg separated doses for =1 week; then 10 mg twice daily ?Patient not taking: Reported on 11/12/2021 04/05/21   Ward Givens, NP  ? ? ?Physical Exam: ?Constitutional: Moderately built and nourished. ?Vitals:  ? 11/12/21 1914 11/12/21 2000 11/12/21 2100 11/12/21 2145  ?BP:  (!) 105/56 106/66 107/66  ?Pulse:  60 62 67  ?Resp:  16 13 14   ?Temp:      ?TempSrc:      ?SpO2:  98% 98% 98%  ?Weight: 81.6 kg     ?Height: 6' (1.829 m)     ? ?Eyes: Anicteric no pallor. ?ENMT: No discharge from the ears eyes nose and mouth. ?Neck: No mass felt.  No neck  rigidity. ?Respiratory: No rhonchi or crepitations. ?Cardiovascular: S1-S2 heard. ?Abdomen: Soft nontender bowel sound present. ?Musculoskeletal: No edema. ?Skin: Chronic skin changes. ?Neurologic: Alert awake oriented to his name.  Moving all extremities.  Generally appears weak. ?Psychiatric: Has advanced dementia. ? ? ?Labs on Admission: I have personally reviewed following labs and imaging studies ? ?CBC: ?Recent Labs  ?Lab 11/08/21 ?0649 11/12/21 ?1918  ?WBC 8.3 9.6  ?NEUTROABS 5.9 7.7  ?HGB 14.1 12.4*  ?HCT 41.9 37.3*  ?MCV 79.1* 79.9*  ?PLT 278 215  ? ?Basic Metabolic Panel: ?Recent Labs  ?Lab 11/08/21 ?0649 11/12/21 ?1918  ?NA 139 136  ?K 3.8 3.7  ?CL 104 103  ?CO2 22 22  ?GLUCOSE  97 173*  ?BUN 60* 49*  ?CREATININE 2.65* 3.05*  ?CALCIUM 9.9 8.9  ? ?GFR: ?Estimated Creatinine Clearance: 20.5 mL/min (A) (by C-G formula based on SCr of 3.05 mg/dL (H)). ?Liver Function Tests: ?Recent Labs  ?Lab 11/08/21 ?0649 11/12/21 ?1918  ?AST 61* 27  ?ALT 47* 31  ?ALKPHOS 69 60  ?BILITOT 0.9 0.8  ?PROT 7.8 6.9  ?ALBUMIN 3.4* 3.2*  ? ?Recent Labs  ?Lab 11/12/21 ?1918  ?LIPASE 62*  ? ?No results for input(s): AMMONIA in the last 168 hours. ?Coagulation Profile: ?Recent Labs  ?Lab 11/08/21 ?7680  ?INR 1.1  ? ?Cardiac Enzymes: ?No results for input(s): CKTOTAL, CKMB, CKMBINDEX, TROPONINI in the last 168 hours. ?BNP (last 3 results) ?No results for input(s): PROBNP in the last 8760 hours. ?HbA1C: ?No results for input(s): HGBA1C in the last 72 hours. ?CBG: ?No results for input(s): GLUCAP in the last 168 hours. ?Lipid Profile: ?No results for input(s): CHOL, HDL, LDLCALC, TRIG, CHOLHDL, LDLDIRECT in the last 72 hours. ?Thyroid Function Tests: ?No results for input(s): TSH, T4TOTAL, FREET4, T3FREE, THYROIDAB in the last 72 hours. ?Anemia Panel: ?No results for input(s): VITAMINB12, FOLATE, FERRITIN, TIBC, IRON, RETICCTPCT in the last 72 hours. ?Urine analysis: ?   ?Component Value Date/Time  ? COLORURINE YELLOW 11/08/2021 1026  ?  APPEARANCEUR CLEAR 11/08/2021 1026  ? LABSPEC 1.008 11/08/2021 1026  ? PHURINE 5.0 11/08/2021 1026  ? GLUCOSEU >=500 (A) 11/08/2021 1026  ? HGBUR MODERATE (A) 11/08/2021 1026  ? BILIRUBINUR NEGATIVE 11/08/2021 1

## 2021-11-13 ENCOUNTER — Encounter (HOSPITAL_COMMUNITY): Payer: Self-pay | Admitting: Internal Medicine

## 2021-11-13 DIAGNOSIS — E440 Moderate protein-calorie malnutrition: Secondary | ICD-10-CM | POA: Diagnosis present

## 2021-11-13 DIAGNOSIS — Z20822 Contact with and (suspected) exposure to covid-19: Secondary | ICD-10-CM | POA: Diagnosis present

## 2021-11-13 DIAGNOSIS — N1832 Chronic kidney disease, stage 3b: Secondary | ICD-10-CM | POA: Diagnosis present

## 2021-11-13 DIAGNOSIS — I69351 Hemiplegia and hemiparesis following cerebral infarction affecting right dominant side: Secondary | ICD-10-CM | POA: Diagnosis not present

## 2021-11-13 DIAGNOSIS — Z85038 Personal history of other malignant neoplasm of large intestine: Secondary | ICD-10-CM | POA: Diagnosis not present

## 2021-11-13 DIAGNOSIS — N400 Enlarged prostate without lower urinary tract symptoms: Secondary | ICD-10-CM | POA: Diagnosis present

## 2021-11-13 DIAGNOSIS — I959 Hypotension, unspecified: Secondary | ICD-10-CM | POA: Diagnosis present

## 2021-11-13 DIAGNOSIS — T502X5A Adverse effect of carbonic-anhydrase inhibitors, benzothiadiazides and other diuretics, initial encounter: Secondary | ICD-10-CM | POA: Diagnosis present

## 2021-11-13 DIAGNOSIS — F32A Depression, unspecified: Secondary | ICD-10-CM | POA: Diagnosis present

## 2021-11-13 DIAGNOSIS — J449 Chronic obstructive pulmonary disease, unspecified: Secondary | ICD-10-CM | POA: Diagnosis present

## 2021-11-13 DIAGNOSIS — D500 Iron deficiency anemia secondary to blood loss (chronic): Secondary | ICD-10-CM | POA: Diagnosis present

## 2021-11-13 DIAGNOSIS — Z809 Family history of malignant neoplasm, unspecified: Secondary | ICD-10-CM | POA: Diagnosis not present

## 2021-11-13 DIAGNOSIS — M6282 Rhabdomyolysis: Secondary | ICD-10-CM | POA: Diagnosis present

## 2021-11-13 DIAGNOSIS — E7800 Pure hypercholesterolemia, unspecified: Secondary | ICD-10-CM | POA: Diagnosis present

## 2021-11-13 DIAGNOSIS — E1122 Type 2 diabetes mellitus with diabetic chronic kidney disease: Secondary | ICD-10-CM | POA: Diagnosis present

## 2021-11-13 DIAGNOSIS — R319 Hematuria, unspecified: Secondary | ICD-10-CM | POA: Diagnosis present

## 2021-11-13 DIAGNOSIS — F0150 Vascular dementia without behavioral disturbance: Secondary | ICD-10-CM | POA: Diagnosis present

## 2021-11-13 DIAGNOSIS — I129 Hypertensive chronic kidney disease with stage 1 through stage 4 chronic kidney disease, or unspecified chronic kidney disease: Secondary | ICD-10-CM | POA: Diagnosis present

## 2021-11-13 DIAGNOSIS — E860 Dehydration: Secondary | ICD-10-CM | POA: Diagnosis present

## 2021-11-13 DIAGNOSIS — Z66 Do not resuscitate: Secondary | ICD-10-CM | POA: Diagnosis present

## 2021-11-13 DIAGNOSIS — K625 Hemorrhage of anus and rectum: Secondary | ICD-10-CM | POA: Diagnosis present

## 2021-11-13 DIAGNOSIS — N179 Acute kidney failure, unspecified: Secondary | ICD-10-CM | POA: Diagnosis not present

## 2021-11-13 DIAGNOSIS — K5900 Constipation, unspecified: Secondary | ICD-10-CM | POA: Diagnosis present

## 2021-11-13 DIAGNOSIS — E11319 Type 2 diabetes mellitus with unspecified diabetic retinopathy without macular edema: Secondary | ICD-10-CM | POA: Diagnosis present

## 2021-11-13 LAB — CBC WITH DIFFERENTIAL/PLATELET
Abs Immature Granulocytes: 0.03 10*3/uL (ref 0.00–0.07)
Basophils Absolute: 0 10*3/uL (ref 0.0–0.1)
Basophils Relative: 0 %
Eosinophils Absolute: 0.1 10*3/uL (ref 0.0–0.5)
Eosinophils Relative: 1 %
HCT: 37.4 % — ABNORMAL LOW (ref 39.0–52.0)
Hemoglobin: 12.5 g/dL — ABNORMAL LOW (ref 13.0–17.0)
Immature Granulocytes: 0 %
Lymphocytes Relative: 20 %
Lymphs Abs: 1.6 10*3/uL (ref 0.7–4.0)
MCH: 26.7 pg (ref 26.0–34.0)
MCHC: 33.4 g/dL (ref 30.0–36.0)
MCV: 79.7 fL — ABNORMAL LOW (ref 80.0–100.0)
Monocytes Absolute: 0.6 10*3/uL (ref 0.1–1.0)
Monocytes Relative: 8 %
Neutro Abs: 5.6 10*3/uL (ref 1.7–7.7)
Neutrophils Relative %: 71 %
Platelets: 223 10*3/uL (ref 150–400)
RBC: 4.69 MIL/uL (ref 4.22–5.81)
RDW: 15 % (ref 11.5–15.5)
WBC: 7.9 10*3/uL (ref 4.0–10.5)
nRBC: 0 % (ref 0.0–0.2)

## 2021-11-13 LAB — TYPE AND SCREEN
ABO/RH(D): O POS
Antibody Screen: NEGATIVE

## 2021-11-13 LAB — URINALYSIS, ROUTINE W REFLEX MICROSCOPIC
Bilirubin Urine: NEGATIVE
Glucose, UA: >=500 mg/dL — AB
Hgb urine dipstick: NEGATIVE
Ketones, ur: NEGATIVE mg/dL
Leukocytes,Ua: NEGATIVE
Nitrite: NEGATIVE
Protein, ur: NEGATIVE mg/dL
Specific Gravity, Urine: 1.01 (ref 1.005–1.030)
pH: 5.5 (ref 5.0–8.0)

## 2021-11-13 LAB — COMPREHENSIVE METABOLIC PANEL
ALT: 26 U/L (ref 0–44)
AST: 23 U/L (ref 15–41)
Albumin: 3.1 g/dL — ABNORMAL LOW (ref 3.5–5.0)
Alkaline Phosphatase: 60 U/L (ref 38–126)
Anion gap: 10 (ref 5–15)
BUN: 48 mg/dL — ABNORMAL HIGH (ref 8–23)
CO2: 24 mmol/L (ref 22–32)
Calcium: 9.1 mg/dL (ref 8.9–10.3)
Chloride: 105 mmol/L (ref 98–111)
Creatinine, Ser: 2.62 mg/dL — ABNORMAL HIGH (ref 0.61–1.24)
GFR, Estimated: 24 mL/min — ABNORMAL LOW (ref >60)
Glucose, Bld: 139 mg/dL — ABNORMAL HIGH (ref 70–99)
Potassium: 3.2 mmol/L — ABNORMAL LOW (ref 3.5–5.1)
Sodium: 139 mmol/L (ref 135–145)
Total Bilirubin: 0.3 mg/dL (ref 0.3–1.2)
Total Protein: 6.6 g/dL (ref 6.5–8.1)

## 2021-11-13 LAB — URINALYSIS, MICROSCOPIC (REFLEX)

## 2021-11-13 LAB — RESP PANEL BY RT-PCR (FLU A&B, COVID) ARPGX2
Influenza A by PCR: NEGATIVE
Influenza B by PCR: NEGATIVE
SARS Coronavirus 2 by RT PCR: NEGATIVE

## 2021-11-13 LAB — GLUCOSE, CAPILLARY
Glucose-Capillary: 130 mg/dL — ABNORMAL HIGH (ref 70–99)
Glucose-Capillary: 196 mg/dL — ABNORMAL HIGH (ref 70–99)
Glucose-Capillary: 115 mg/dL — ABNORMAL HIGH (ref 70–99)
Glucose-Capillary: 138 mg/dL — ABNORMAL HIGH (ref 70–99)

## 2021-11-13 LAB — CK: Total CK: 118 U/L (ref 49–397)

## 2021-11-13 MED ORDER — POTASSIUM CHLORIDE CRYS ER 20 MEQ PO TBCR
40.0000 meq | EXTENDED_RELEASE_TABLET | Freq: Once | ORAL | Status: AC
  Administered 2021-11-13: 40 meq via ORAL
  Filled 2021-11-13: qty 2

## 2021-11-13 MED ORDER — INSULIN ASPART 100 UNIT/ML IJ SOLN
0.0000 [IU] | Freq: Three times a day (TID) | INTRAMUSCULAR | Status: DC
  Administered 2021-11-13 – 2021-11-14 (×3): 1 [IU] via SUBCUTANEOUS
  Administered 2021-11-15: 2 [IU] via SUBCUTANEOUS
  Administered 2021-11-15: 5 [IU] via SUBCUTANEOUS
  Administered 2021-11-16: 1 [IU] via SUBCUTANEOUS
  Administered 2021-11-17: 3 [IU] via SUBCUTANEOUS

## 2021-11-13 NOTE — Evaluation (Signed)
Physical Therapy Evaluation ?Patient Details ?Name: Ronald Dyer ?MRN: [id number] ?DOB: 05/25/1941 ?Today's Date: 11/13/2021 ? ?History of Present Illness ? 81 y.o. male with history of advanced dementia uses a walker to ambulate, hypertension, diabetes mellitus type 2, chronic kidney disease stage III, history of colon cancer status post right hemicolectomy was brought to the ER because patient was found to have a low blood pressure and acute renal failure. ?  ?Clinical Impression ? Pt is an 81 y.o. male with above HPI resulting in the deficits listed below (see PT Problem List). At baseline, pt lives with his wife who assist with ADLs and reports he is typically able to ambulate with RW without assist. Pt's wife states that she is only one around to assist at home. Pt performed sit to stand transfers with MIN guard for safety and cues for safe hand placement. Pt with questionable visual deficits, requiring hand over hand facilitation for proper hand placement with transfers due to difficulty finding armrest on recliner and proper placement for hands on RW. Pt also repeatedly bumping into obstacles on his R side during ambulation with increased difficulty for problem solving ultimately requiring assist from therapist to navigate. Pt ambulated total of ~55ft demonstrating narrow BOS, intermittent scissoring, and steppping on his own toes leading to intermittent LOB requiring up to MIN A from therapist for stability. Pt is fall risk with current balance and ambulation impairments. Pt will benefit from continued skilled PT during stay and short term rehab at d/c to progress functional mobility and increase independence in order to maximize safety for pt and caregiver prior to d/c home.  ?   ?   ? ?Recommendations for follow up therapy are one component of a multi-disciplinary discharge planning process, led by the attending physician.  Recommendations may be updated based on patient status, additional functional  criteria and insurance authorization. ? ?Follow Up Recommendations Skilled nursing-short term rehab (<3 hours/day) ? ?  ?Assistance Recommended at Discharge Frequent or constant Supervision/Assistance  ?Patient can return home with the following ? A little help with walking and/or transfers;A little help with bathing/dressing/bathroom;Assistance with cooking/housework;Assistance with feeding;Direct supervision/assist for medications management;Direct supervision/assist for financial management;Assist for transportation;Help with stairs or ramp for entrance ? ?  ?Equipment Recommendations None recommended by PT (pt owns RW)  ?Recommendations for Other Services ?    ?  ?Functional Status Assessment Patient has had a recent decline in their functional status and demonstrates the ability to make significant improvements in function in a reasonable and predictable amount of time.  ? ?  ?Precautions / Restrictions Precautions ?Precautions: Fall ?Restrictions ?Weight Bearing Restrictions: No  ? ?  ? ?Mobility ? Bed Mobility ?  ?  ?  ?  ?  ?  ?  ?General bed mobility comments: OOB in recliner ?  ? ?Transfers ?Overall transfer level: Needs assistance ?Equipment used: Rolling walker (2 wheels) ?Transfers: Sit to/from Stand ?Sit to Stand: Min guard ?  ?  ?  ?  ?  ?General transfer comment: hand over hand guidance for proper hand placement, pt intially attempting to use B UEs on RW and wheels coming up from floor. Questionable visual deficits- pt able to follow all commands throughout session, but feeling around with hands in different places on walker despite verbal and visual cuing, therapist giving cues to "put your R hand where my hand is" and even making tapping sound on armrest and pt still unable to find.  ultimtaley requiring hand over hand guidance for  use of B UEs on armrest of recliner to power up. ?  ? ?Ambulation/Gait ?Ambulation/Gait assistance: Min assist ?Gait Distance (Feet): 60 Feet ?Assistive device: Rolling  walker (2 wheels) ?Gait Pattern/deviations: Step-through pattern, Decreased stride length, Narrow base of support, Scissoring ?Gait velocity: decr ?  ?  ?General Gait Details: Pt with intermittent LOB requiring up to MIN A to correct, narrow BOS and intermittent scissoring and steppping on his own toes. Bumping into obstacles on R side in hallway with increased time and difficutly with problem solving, requring cues from therapist to negotiate around obstacles. Pt expressed frustation to therapist about increased balance deficits he has noticed. Pt's wife reports that pt is typically able to ambulate with his walker and requires no physical assist. ? ?Stairs ?  ?  ?  ?  ?  ? ?Wheelchair Mobility ?  ? ?Modified Rankin (Stroke Patients Only) ?  ? ?  ? ?Balance Overall balance assessment: Needs assistance ?Sitting-balance support: Feet supported ?Sitting balance-Leahy Scale: Good ?  ?  ?Standing balance support: Bilateral upper extremity supported, During functional activity, Reliant on assistive device for balance ?Standing balance-Leahy Scale: Poor ?  ?  ?  ?  ?  ?  ?  ?  ?  ?  ?  ?  ?   ? ? ? ?Pertinent Vitals/Pain Pain Assessment ?Pain Assessment: No/denies pain  ? ? ?Home Living Family/patient expects to be discharged to:: Private residence ?Living Arrangements: Spouse/significant other ?Available Help at Discharge: Family;Available 24 hours/day ?Type of Home: House ?Home Access: Stairs to enter ?Entrance Stairs-Rails: Left ?Entrance Stairs-Number of Steps: 3 ?  ?Home Layout: One level ?Home Equipment: Conservation officer, nature (2 wheels);Cane - single point ?Additional Comments: most recent fall was tuesday, fell back into the chair. history of falls. was doing HHPT/OT 2x/wk, but hadn't done insurance reauthorization and now holding off due to hospital admission.  ?  ?Prior Function Prior Level of Function : Needs assist ?  ?  ?  ?  ?  ?  ?Mobility Comments: uses a walker in the house - not very active decreased mobility  for 3-4 months. Ambulates around home mostly ?ADLs Comments: wife does 75% of dressing and bathing, was able to feed himself (now he pretends) ?  ? ? ?Hand Dominance  ? Dominant Hand: Right ? ?  ?Extremity/Trunk Assessment  ? Upper Extremity Assessment ?Upper Extremity Assessment: LUE deficits/detail;RUE deficits/detail ?RUE Deficits / Details: WFL ROM (though drift in RUE compared to left ), grossly 4/5 strength - increased effort to maintain resistance ?RUE Coordination:  (Unable to follow instructions to perform finger to thumb but able to crumble a paper towel. Grossly able to perform reaching out to retrieve item - but exhibited dysmetria with trying to touch finger (appears to be a visual problem)) ?LUE Deficits / Details: WFL ROM and strength, grossly 4/5 ?LUE Coordination:  (grossly functional) ?  ? ?Lower Extremity Assessment ?Lower Extremity Assessment: Defer to PT evaluation ?RLE Deficits / Details: 4+/5 throughout, except hip flexion 4-/5. Pt reports right side weaker than his left ?LLE Deficits / Details: WNL except, hip flexion 4-/5 ?  ? ?Cervical / Trunk Assessment ?Cervical / Trunk Assessment: Normal  ?Communication  ? Communication: Expressive difficulties (mumbles, decreased sentence length. increased time to respond)  ?Cognition Arousal/Alertness: Awake/alert ?Behavior During Therapy: [facility] for tasks assessed/performed ?Overall Cognitive Status: History of cognitive impairments - at baseline ?  ?  ?  ?  ?  ?  ?  ?  ?  ?  ?  ?  ?  ?  ?  ?  ?  General Comments: history of dementia. Pt oriented to self and location. [place] to recall month/yr and with short term meemory deficits as therapist reorienting, but pt still unable to correctly state. ?  ?  ? ?  ?General Comments   ? ?  ?Exercises    ? ?Assessment/Plan  ?  ?PT Assessment Patient needs continued PT services  ?PT Problem List Decreased strength;Decreased activity tolerance;Decreased balance;Decreased mobility;Decreased knowledge of use of DME ? ?    ?  ?PT Treatment Interventions DME instruction;Gait training;Stair training;Functional mobility training;Therapeutic activities;Therapeutic exercise;Balance training;Patient/family education   ? ?PT Goals

## 2021-11-13 NOTE — Hospital Course (Addendum)
81 y.o. male with history of advanced dementia uses a walker to ambulate, hypertension, diabetes mellitus type 2, chronic kidney disease stage III, history of colon cancer status post right hemicolectomy was brought to the ER because patient was found to have a low blood pressure.  Patient was in the ER about 4 days ago for concern for rectal bleeding at that time it was noted that patient actually had hematuria and was referred to urologist.  Per patient's wife who provided the history patient did go to urologist 2 days PTA, had procedure likely could be cystoscopy and was told that there was no active bleeding.  Has follow-up with urologist.  Over the last 2 days PTA patient's wife noticed that patient blood pressure was less than 90 systolic. when patient's home health aide came and checked her blood pressure was low and patient was brought to the ER.  Per patient's wife patient has been having poor appetite and decreased bowel movements.  Did not have any nausea vomiting chest pain shortness of breath.  Had come to the ER about 2 weeks ago for a fall at that time CT head C-spine x-rays were unrevealing. ?In ED: ET-624 systolic.  Labs show progressive worsening renal function.  Creatinine on June 2021 was 1.5 it has progressively worsened to 1.9 in January 2023 and last week it was 2.6 now it is 3.05.  Hemoglobin also dropped by 2 g from last week.  Patient was given 1 L fluid bolus admitted for acute renal failure.  Patient was admitted for further management. ?He was continued on aggressive IV fluid hydration with improvement in his renal function.  He was seen by PT OT advised skilled nursing facility at this time. ?Mentation is stable at baseline, creatinine improved to 1.3.  He remains medically stable discharge to skilled nursing facility once bed available ?

## 2021-11-13 NOTE — Plan of Care (Signed)
Pt admitted to unit this shift. Oriented to room and call bell, however, memory waxes and wanes. No acute events overnight. ?Problem: Education: ?Goal: Knowledge of General Education information will improve ?Description: Including pain rating scale, medication(s)/side effects and non-pharmacologic comfort measures ?Outcome: Progressing ?  ?Problem: Health Behavior/Discharge Planning: ?Goal: Ability to manage health-related needs will improve ?Outcome: Progressing ?  ?Problem: Clinical Measurements: ?Goal: Ability to maintain clinical measurements within normal limits will improve ?Outcome: Progressing ?Goal: Will remain free from infection ?Outcome: Progressing ?Goal: Diagnostic test results will improve ?Outcome: Progressing ?Goal: Respiratory complications will improve ?Outcome: Progressing ?Goal: Cardiovascular complication will be avoided ?Outcome: Progressing ?  ?Problem: Activity: ?Goal: Risk for activity intolerance will decrease ?Outcome: Progressing ?  ?Problem: Nutrition: ?Goal: Adequate nutrition will be maintained ?Outcome: Progressing ?  ?Problem: Coping: ?Goal: Level of anxiety will decrease ?Outcome: Progressing ?  ?Problem: Elimination: ?Goal: Will not experience complications related to bowel motility ?Outcome: Progressing ?Goal: Will not experience complications related to urinary retention ?Outcome: Progressing ?  ?Problem: Pain Managment: ?Goal: General experience of comfort will improve ?Outcome: Progressing ?  ?Problem: Safety: ?Goal: Ability to remain free from injury will improve ?Outcome: Progressing ?  ?Problem: Skin Integrity: ?Goal: Risk for impaired skin integrity will decrease ?Outcome: Progressing ?  ?

## 2021-11-13 NOTE — Progress Notes (Signed)
?PROGRESS NOTE ?Ronald Dyer [place]  PJA:[id number] DOB: 05-19-1941 DOA: 11/12/2021 ?PCP: [name], MD  ? ?Brief Narrative/Hospital Course: ? 81 y.o. male with history of advanced dementia uses a walker to ambulate, hypertension, diabetes mellitus type 2, chronic kidney disease stage III, history of colon cancer status post right hemicolectomy was brought to the ER because patient was found to have a low blood pressure.  Patient was in the ER about 4 days ago for concern for rectal bleeding at that time it was noted that patient actually had hematuria and was referred to urologist.  Per patient's wife who provided the history patient did go to urologist 2 days PTA, had procedure likely could be cystoscopy and was told that there was no active bleeding.  Has follow-up with urologist.  Over the last 2 days PTA patient's wife noticed that patient blood pressure was less than 90 systolic. when patient's home health aide came and checked her blood pressure was low and patient was brought to the ER.  Per patient's wife patient has been having poor appetite and decreased bowel movements.  Did not have any nausea vomiting chest pain shortness of breath.  Had come to the ER about 2 weeks ago for a fall at that time CT head C-spine x-rays were unrevealing. ?In ED: HA-193 systolic.  Labs show progressive worsening renal function.  Creatinine on June 2021 was 1.5 it has progressively worsened to 1.9 in January 2023 and last week it was 2.6 now it is 3.05.  Hemoglobin also dropped by 2 g from last week.  Patient was given 1 L fluid bolus admitted for acute renal failure.  Patient was admitted for further management. ?  ?  ?Subjective: ?Seen and examined this morning wife is at the bedside, was able to ambulate some, ate his breakfast, had BM yesterday. ? ?Assessment and Plan: ?Principal Problem: ?  ARF (acute renal failure) ([place]) ?Active Problems: ?  Type 2 diabetes mellitus ([place]) ?  Colon cancer ([place]) ?  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  Denies any abdominal pain.  Continue with gentle IV fluid hydration given his still elevated creatinine level/and marginal oral intake and at risk of worsening acute renal failure.  Likely decreased intake from constipation had a bowel movement yesterday.  PT OT evaluation ?Recent Labs  ?Lab 11/08/21 ?0649 11/12/21 ?1918 11/13/21 ?7902  ?BUN 60* 49* 48*  ?CREATININE 2.65* 3.05* 2.62*  ?  ?Normocytic anemia with recent hematuria Hemoccult negative.  Hemoglobin stable ? ?Poor oral intake ?Dehydration ?Abdominal pain? ?constipation for 3 4 days: ?Continue IV fluids.  Dietitian consulted, augment nutritional status.  Encourage oral intake.  CT renal stone yesterday 3/21 with cholelithiasis remote right hemicolectomy, chronic prostate enlargement no acute finding ? ?Hypotension at home, history of hypertension blood pressure stable.  HCTZ on hold.  ? ?Type 2 diabetes mellitus: ?Monitor CBG with SSI ?Recent Labs  ?Lab 11/13/21 ?0730  ?GLUCAP 130*  ?  ?Advanced dementia on Aricept-mental status stable, wife at the bedside.  Lives with wife. ?History of stroke on statin, monitor CK ?Cholelithiasis seen on the CT scan recently done ? ?DVT prophylaxis: heparin injection 5,000 Units Start: 11/12/21 2230 ?Code Status:   Code Status: DNR ?Family Communication: plan of care discussed with patient/wife at bedside. ?Patient status  is: Observation anticipate hospital stay at least 2 midnight,  Level of care: Telemetry  ?Remains hospitalized for ongoing IV fluid hydration PT OT evaluation ?Patient currently not stable ? ?Dispo: The patient is from: home ?           Anticipated disposition: TBD ? ?Mobility Assessment (last 72 hours)   ? ?  Mobility Assessment   ? ? [place] Name 11/13/21 0800 11/12/21 2300  ?  ?  ?  ? Does patient have an order for bedrest or is patient medically unstable No - Continue assessment No - Continue assessment     ? What is the highest level of mobility based on the progressive mobility assessment? Level 2 ([place]) - Balance while sitting on edge of bed and cannot stand Level 2 ([place]) - Balance while sitting on edge of bed and cannot stand     ? Is the above level different from baseline mobility prior to current illness? Yes - Recommend PT order Yes - Recommend PT order     ? ?  ?  ? ?  ?  ? ?Objective: ?Vitals last 24 hrs: ?Vitals:  ? 11/12/21 2248 11/13/21 0248 11/13/21 0500 11/13/21 0508  ?BP: 122/61 109/61  118/78  ?Pulse: 67 64  76  ?Resp: 16 16  16   ?Temp: 98.4 ?F (36.9 ?C) 97.9 ?F (36.6 ?C)  98.4 ?F (36.9 ?C)  ?TempSrc: Oral Oral  Oral  ?SpO2: 100% 97%  100%  ?Weight: 85 kg  87 kg   ?Height: 6' (1.829 m)     ? ?Weight change:  ? ?Physical Examination: ?General exam: AA oriented to self hospital but does not know which 1, oriented to wife,older than stated age, weak appearing. ?HEENT:Oral mucosa moist, Ear/Nose WNL grossly, dentition normal. ?Respiratory system: bilaterally clear BS, no use of accessory muscle ?Cardiovascular system: S1 & S2 +, No JVD,. ?Gastrointestinal system: Abdomen soft,NT,ND, BS+ ?Nervous System:Alert, awake, moving extremities and grossly nonfocal ?Extremities: LE edema NONE ,distal peripheral pulses palpable.  ?Skin: No rashes,no icterus. ?MSK: Normal muscle bulk,tone, power ? ?Medications reviewed:  ?Scheduled Meds: ? aspirin  81 mg Oral QODAY  ? [START ON 11/15/2021] atorvastatin  10 mg Oral Once per day on Tue Thu  ? donepezil  10 mg Oral QHS  ? heparin  5,000 Units Subcutaneous Q8H  ? insulin aspart  0-6 Units Subcutaneous TID WC  ? pantoprazole  40 mg Oral Daily  ? sertraline  100 mg Oral Daily  ? tamsulosin  0.4 mg Oral Daily  ? vitamin B-12  50-100 mcg Oral Daily  ? ?Continuous  Infusions: ? sodium chloride 100 mL/hr at 11/13/21 0505  ? ? ?  ?Diet Order   ? ?       ?  Diet heart healthy/carb modified Room service appropriate? Yes; Fluid consistency: Thin  Diet effective now       ?  ? ?  ?  ? ?  ?  ? ?  ?  ?  ? ? ?Intake/Output Summary (Last 24 hours) at 11/13/2021 1138 ?Last data filed at 11/13/2021 1106 ?Gross per 24 hour  ?Intake 1070 ml  ?Output --  ?Net 1070 ml  ? ?Net IO Since Admission: 1,070 mL [11/13/21 1138]  ?Wt Readings from Last 3 Encounters:  ?11/13/21 87 kg  ?11/08/21 81.6 kg  ?09/01/21 90.7 kg  ?  ? ?Unresulted Labs (From admission, onward)  ? ?  Start     Ordered  ? 11/14/21 4315  Basic metabolic panel  Tomorrow morning,   R       ? 11/13/21 0808  ? 11/14/21 0500  CBC  Tomorrow morning,   R       ? 11/13/21 0808  ? 11/13/21 1134  Glucose, capillary  Once,   R       ? 11/13/21 1134  ? ?  ?  ? ?  ?Data Reviewed: I have personally reviewed following labs and imaging studies ?CBC: ?Recent Labs  ?Lab 11-Nov-2021 ?0649 11/12/21 ?1918 11/13/21 ?6283  ?WBC 8.3 9.6 7.9  ?NEUTROABS 5.9 7.7 5.6  ?HGB 14.1 12.4* 12.5*  ?HCT 41.9 37.3* 37.4*  ?MCV 79.1* 79.9* 79.7*  ?PLT 278 215 223  ? ?Basic Metabolic Panel: ?Recent Labs  ?Lab 11/11/21 ?0649 11/12/21 ?1918 11/13/21 ?6629  ?NA 139 136 139  ?K 3.8 3.7 3.2*  ?CL 104 103 105  ?CO2 22 22 24   ?GLUCOSE 97 173* 139*  ?BUN 60* 49* 48*  ?CREATININE 2.65* 3.05* 2.62*  ?CALCIUM 9.9 8.9 9.1  ? ?GFR: ?Estimated Creatinine Clearance: 23.9 mL/min (A) (by C-G formula based on SCr of 2.62 mg/dL (H)). ?Liver Function Tests: ?Recent Labs  ?Lab November 11, 2021 ?0649 11/12/21 ?1918 11/13/21 ?4765  ?AST 61* 27 23  ?ALT 47* 31 26  ?ALKPHOS 69 60 60  ?BILITOT 0.9 0.8 0.3  ?PROT 7.8 6.9 6.6  ?ALBUMIN 3.4* 3.2* 3.1*  ? ?Recent Labs  ?Lab 11/12/21 ?1918  ?LIPASE 62*  ? ?No results for input(s): AMMONIA in the last 168 hours. ?Coagulation Profile: ?Recent Labs  ?Lab 11-11-21 ?4650  ?INR 1.1  ? ?BNP (last 3 results) ?No results for input(s): PROBNP in the last 8760  hours. ?HbA1C: ?No results for input(s): HGBA1C in the last 72 hours. ?CBG: ?Recent Labs  ?Lab 11/13/21 ?0730  ?GLUCAP 130*  ? ?Lipid Profile: ?No results for input(s): CHOL, HDL, LDLCALC, TRIG, CHOLHDL, LDLDIRECT in the las

## 2021-11-13 NOTE — Evaluation (Addendum)
Occupational Therapy Evaluation ?Patient Details ?Name: Ronald Dyer ?MRN: [id number] ?DOB: 1941/02/20 ?Today's Date: 11/13/2021 ? ? ?History of Present Illness 81 y.o. male with history of advanced dementia uses a walker to ambulate, hypertension, diabetes mellitus type 2, hx of right sided deficits s/p cvs, chronic kidney disease stage III, history of colon cancer status post right hemicolectomy was brought to the ER because patient was found to have a low blood pressure and acute renal failure.  ? ?Clinical Impression ?  ?Ronald Dyer is an 81 year old man who presents with above medical history and presents with generalized weakness, decreased activity tolerance, impaired balance, cognitive impairment and visual deficits. Patient needing increased assistance for transfers and mobility - at least min assist physically to transfer and ambulate in room to maintain balance and verbal cues for sequencing and increased assistance for ADLs. Wife reports he recently starting to "pretend to eat" but therapist wonders if patient unaware that food isn't in his hand. Patient presents with visual difficulties - exhibits dysmetria with reaching target. miscounting fingers in visual fields, reports increased blurry vision in right eye compared to left. Patient's dementia limits his ability to report on impairment. Patient's right pupil pin point and does not dilate. Patient's wife reports no significant difficulties with right eye but prior OT eval in 2021 reports visual deficits. History reports diabetic retinopathy and hx of cataracts in 2011. ED note from 3/21 reports pupils are equal, round, and reactive to light. He needs constant cues for hand placement with transfers and use of walker . Patient will benefit from skilled OT services while in hospital to improve deficits and learn compensatory strategies as needed in order to return to PLOF.  Recommend short term rehab prior to discharge.  ?   ? ?Recommendations  for follow up therapy are one component of a multi-disciplinary discharge planning process, led by the attending physician.  Recommendations may be updated based on patient status, additional functional criteria and insurance authorization.  ? ?Follow Up Recommendations ? Skilled nursing-short term rehab (<3 hours/day)  ?  ?Assistance Recommended at Discharge    ?Patient can return home with the following A little help with walking and/or transfers;A little help with bathing/dressing/bathroom;Assistance with cooking/housework;Assistance with feeding;Direct supervision/assist for financial management;Direct supervision/assist for medications management;Assist for transportation;Help with stairs or ramp for entrance ? ?  ?Functional Status Assessment ? Patient has had a recent decline in their functional status and demonstrates the ability to make significant improvements in function in a reasonable and predictable amount of time.  ?Equipment Recommendations ? None recommended by OT  ?  ?Recommendations for Other Services   ? ? ?  ?Precautions / Restrictions Precautions ?Precautions: Fall ?Restrictions ?Weight Bearing Restrictions: No  ? ?  ? ?   ?Balance Overall balance assessment: Needs assistance ?Sitting-balance support: No upper extremity supported, Feet supported ?Sitting balance-Leahy Scale: Good ?  ?  ?Standing balance support: Bilateral upper extremity supported, During functional activity, Reliant on assistive device for balance ?Standing balance-Leahy Scale: Poor ?  ?  ?  ?  ?  ?  ?  ?  ?  ?  ?  ?  ?   ? ?ADL either performed or assessed with clinical judgement  ? ?ADL Overall ADL's : Needs assistance/impaired ?Eating/Feeding: Minimal assistance;Sitting ?Eating/Feeding Details (indicate cue type and reason): wife reports he is "pretending to eat" at times recently (could be a visual deficit). Patient able to grossly bring hand to mouth. ?Grooming: Set up;Sitting;Cueing for sequencing ?  ?  Upper Body Bathing:  Moderate assistance;Sitting;Cueing for sequencing ?  ?Lower Body Bathing: Moderate assistance;Cueing for sequencing ?  ?Upper Body Dressing : Moderate assistance;Sitting;Cueing for sequencing ?  ?Lower Body Dressing: Maximal assistance;Sit to/from stand ?Lower Body Dressing Details (indicate cue type and reason): ale to don right sock unable to don left due to poor attention, needs assistance with pulling clothing up ?Toilet Transfer: Minimal assistance;Rolling walker (2 wheels);Cueing for safety;Cueing for sequencing;BSC/3in1 ?  ?Toileting- Clothing Manipulation and Hygiene: Maximal assistance;Sit to/from stand ?  ?Tub/ Shower Transfer: Minimal assistance;Shower seat;Grab bars ?  ?Functional mobility during ADLs: Minimal assistance;Cueing for safety;Cueing for sequencing;Rolling walker (2 wheels) ?   ? ? ? ?Vision Patient Visual Report: Diplopia (questionable) ?Additional Comments: Patietn exhibited difficulty following instructions to track. patient able to state correct number fingers on the left but incorrect on the right. When asked how many fingers he sees in front of him he sees two instead of one. Questionale/possible diploplia due to counting incorrectly and dysmetria - missing completely finger during finger to note. Reports blurry vision when one eye is occluded - more so in right. Right eye pin point with no dilation.  ?   ?Perception   ?  ?Praxis   ?  ? ?Pertinent Vitals/Pain Pain Assessment ?Pain Assessment: No/denies pain  ? ? ? ?Hand Dominance Right ?  ?Extremity/Trunk Assessment Upper Extremity Assessment ?Upper Extremity Assessment: LUE deficits/detail;RUE deficits/detail ?RUE Deficits / Details: WFL ROM (though drift in RUE compared to left ), grossly 4/5 strength - increased effort to maintain resistance ?RUE Coordination:  (Unable to follow instructions to perform finger to thumb but able to crumble a paper towel. Grossly able to perform reaching out to retrieve item - but exhibited dysmetria  with trying to touch finger (appears to be a visual problem)) ?LUE Deficits / Details: WFL ROM and strength, grossly 4/5 ?LUE Coordination:  (grossly functional) ?  ?Lower Extremity Assessment ?Lower Extremity Assessment: Defer to PT evaluation ?RLE Deficits / Details: 4+/5 throughout, except hip flexion 4-/5. Pt reports right side weaker than his left ?LLE Deficits / Details: WNL except, hip flexion 4-/5 ?  ?Cervical / Trunk Assessment ?Cervical / Trunk Assessment: Normal ?  ?Communication Communication ?Communication: Expressive difficulties (mumbles, decreased sentence length. increased time to respond) ?  ?Cognition Arousal/Alertness: Awake/alert ?Behavior During Therapy: [facility] for tasks assessed/performed ?Overall Cognitive Status: History of cognitive impairments - at baseline ?  ?  ?  ?  ?  ?  ?  ?  ?  ?  ?  ?  ?  ?  ?  ?  ?General Comments: Hx of dementia ?  ?  ?   ?   ?   ? ? ?Home Living Family/patient expects to be discharged to:: Private residence ?Living Arrangements: Spouse/significant other ?Available Help at Discharge: Family;Available 24 hours/day ?Type of Home: House ?Home Access: Stairs to enter ?Entrance Stairs-Number of Steps: 3 ?Entrance Stairs-Rails: Left ?Home Layout: One level ?  ?  ?Bathroom Shower/Tub: Tub/shower unit ?  ?Bathroom Toilet: Standard ?  ?  ?Home Equipment: Conservation officer, nature (2 wheels);Cane - single point ?  ?Additional Comments: most recent fall was tuesday, fell back into the chair. history of falls. was doing HHPT/OT 2x/wk, but hadn't done insurance reauthorization and now holding off due to hospital admission. ?  ? ?  ?Prior Functioning/Environment Prior Level of Function : Needs assist ?  ?  ?  ?  ?  ?  ?Mobility Comments: uses a walker in the house -  not very active decreased mobility for 3-4 months. Ambulates around home mostly ?ADLs Comments: wife does 75% of dressing and bathing, was able to feed himself (now he pretends) ?  ? ?  ?  ?OT Problem List: Decreased  strength;Decreased activity tolerance;Impaired balance (sitting and/or standing);Impaired vision/perception;Decreased coordination;Decreased cognition;Decreased safety awareness;Decreased knowledge of use of DME o

## 2021-11-14 DIAGNOSIS — N179 Acute kidney failure, unspecified: Secondary | ICD-10-CM | POA: Diagnosis not present

## 2021-11-14 LAB — BASIC METABOLIC PANEL
Anion gap: 8 (ref 5–15)
BUN: 34 mg/dL — ABNORMAL HIGH (ref 8–23)
CO2: 20 mmol/L — ABNORMAL LOW (ref 22–32)
Calcium: 8.9 mg/dL (ref 8.9–10.3)
Chloride: 111 mmol/L (ref 98–111)
Creatinine, Ser: 1.83 mg/dL — ABNORMAL HIGH (ref 0.61–1.24)
GFR, Estimated: 36 mL/min — ABNORMAL LOW (ref >60)
Glucose, Bld: 133 mg/dL — ABNORMAL HIGH (ref 70–99)
Potassium: 4 mmol/L (ref 3.5–5.1)
Sodium: 139 mmol/L (ref 135–145)

## 2021-11-14 LAB — CBC
HCT: 36.1 % — ABNORMAL LOW (ref 39.0–52.0)
Hemoglobin: 12 g/dL — ABNORMAL LOW (ref 13.0–17.0)
MCH: 26.9 pg (ref 26.0–34.0)
MCHC: 33.2 g/dL (ref 30.0–36.0)
MCV: 80.9 fL (ref 80.0–100.0)
Platelets: 198 10*3/uL (ref 150–400)
RBC: 4.46 MIL/uL (ref 4.22–5.81)
RDW: 15 % (ref 11.5–15.5)
WBC: 7.7 10*3/uL (ref 4.0–10.5)
nRBC: 0 % (ref 0.0–0.2)

## 2021-11-14 LAB — GLUCOSE, CAPILLARY
Glucose-Capillary: 131 mg/dL — ABNORMAL HIGH (ref 70–99)
Glucose-Capillary: 137 mg/dL — ABNORMAL HIGH (ref 70–99)
Glucose-Capillary: 168 mg/dL — ABNORMAL HIGH (ref 70–99)

## 2021-11-14 MED ORDER — ADULT MULTIVITAMIN W/MINERALS CH
1.0000 | ORAL_TABLET | Freq: Every day | ORAL | Status: DC
  Administered 2021-11-14 – 2021-11-17 (×4): 1 via ORAL
  Filled 2021-11-14 (×4): qty 1

## 2021-11-14 MED ORDER — BOOST / RESOURCE BREEZE PO LIQD CUSTOM
1.0000 | Freq: Three times a day (TID) | ORAL | Status: DC
  Administered 2021-11-14 – 2021-11-17 (×8): 1 via ORAL

## 2021-11-14 NOTE — Progress Notes (Addendum)
Initial Nutrition Assessment ? ?DOCUMENTATION CODES:  ? ?Non-severe (moderate) malnutrition in context of chronic illness ? ?INTERVENTION:  ? ?-Boost Breeze po TID, each supplement provides 250 kcal and 9 grams of protein ? ?-Liberalize diet to regular diet to encourage PO intakes and to not limit menu options ? ?-Magic cup BID with meals, each supplement provides 290 kcal and 9 grams of protein  ? ?-Multivitamin with minerals daily ? ? ?NUTRITION DIAGNOSIS:  ? ?Moderate Malnutrition related to chronic illness (advanced dementia) as evidenced by mild fat depletion, mild muscle depletion. ? ?GOAL:  ? ?Patient will meet greater than or equal to 90% of their needs ? ?MONITOR:  ? ?PO intake, Supplement acceptance, Weight trends, Labs, I & O's ? ?REASON FOR ASSESSMENT:  ? ?Consult ?Assessment of nutrition requirement/status ? ?ASSESSMENT:  ? ?81 y.o. male with history of advanced dementia uses a walker to ambulate, hypertension, diabetes mellitus type 2, chronic kidney disease stage III, history of colon cancer status post right hemicolectomy was brought to the ER because patient was found to have a low blood pressure. ? ?Patient in room, eating lunch meal. Wife is at bedside able to provide history. Pt eating but seems somewhat confused when questioned. Pt's wife reports pt had good appetite until his fall around a week ago. Pt initially went to [facility], intakes had steadily decreased.  ?Typically would consume 2 eggs, toast, with fried apples for breakfast. Eventually pt was consuming less and less of his eggs. Eats lunch of personal pizzas, was eating only bites of this. Pt's wife did not elaborate on dinner meals.  ? ?Pt ate a good breakfast, consisting of pancakes, oatmeal and applesauce. ?PO intakes 3/26: 100% of lunch and 50% of dinner. ? ?Pt denies issues with swallowing. Has chewing difficulties given missing some teeth. Pt has also been struggling with constipation, which has likely impacted appetite and ability to  eat.  ? ?Pt with Boost Breeze in room. States he will try them. Will order as well as Magic cups with meals.   ?Per wife, pt takes Vitamin B-12 supplements. Will order daily MVI as well. ? ?Per weight records, pt has lost 15 lbs since August 2022 (7% wt loss x 7 months, insignificant for time frame). ? ?Medications: Vitamin B-12 ? ?Labs reviewed: ? CBGs: 115-196 ? ?NUTRITION - FOCUSED PHYSICAL EXAM: ? ?Flowsheet Row Most Recent Value  ?Orbital Region Mild depletion  ?Upper Arm Region Mild depletion  ?Thoracic and Lumbar Region Mild depletion  ?Buccal Region Mild depletion  ?Temple Region Mild depletion  ?Clavicle Bone Region No depletion  ?Clavicle and Acromion Bone Region Mild depletion  ?Scapular Bone Region Mild depletion  ?Dorsal Hand No depletion  ?Patellar Region No depletion  ?Anterior Thigh Region No depletion  ?Posterior Calf Region No depletion  ?Edema (RD Assessment) None  ?Hair Reviewed  ?Eyes Reviewed  [reports worsening vision]  ?Mouth Reviewed  [missing teeth]  ?Skin Reviewed  ?Nails Reviewed  ? ?  ? ? ?Diet Order:   ?Diet Order   ? ?       ?  Diet heart healthy/carb modified Room service appropriate? Yes; Fluid consistency: Thin  Diet effective now       ?  ? ?  ?  ? ?  ? ? ?EDUCATION NEEDS:  ? ?No education needs have been identified at this time ? ?Skin:  Skin Assessment: Reviewed RN Assessment ? ?Last BM:  3/26 -type 4 ? ?Height:  ? ?Ht Readings from Last 1 Encounters:  ?  11/12/21 6' (1.829 m)  ? ? ?Weight:  ? ?Wt Readings from Last 1 Encounters:  ?11/14/21 86.5 kg  ? ? ?BMI:  Body mass index is 25.86 kg/m?. ? ?Estimated Nutritional Needs:  ? ?Kcal:  2200-2400 ? ?Protein:  105-115g ? ?Fluid:  2.2L/day ? ? ?Clayton Bibles, MS, RD, LDN ?Inpatient Clinical Dietitian ?Contact information available via Amion ? ?

## 2021-11-14 NOTE — Progress Notes (Signed)
?PROGRESS NOTE ?Ronald Dyer [place]  GYF:[id number] DOB: [birth year] DOA: 11/12/2021 ?PCP: [name], MD  ? ?Brief Narrative/Hospital Course: ? 81 y.o. male with history of advanced dementia uses a walker to ambulate, hypertension, diabetes mellitus type 2, chronic kidney disease stage III, history of colon cancer status post right hemicolectomy was brought to the ER because patient was found to have a low blood pressure.  Patient was in the ER about 4 days ago for concern for rectal bleeding at that time it was noted that patient actually had hematuria and was referred to urologist.  Per patient's wife who provided the history patient did go to urologist 2 days PTA, had procedure likely could be cystoscopy and was told that there was no active bleeding.  Has follow-up with urologist.  Over the last 2 days PTA patient's wife noticed that patient blood pressure was less than 90 systolic. when patient's home health aide came and checked her blood pressure was low and patient was brought to the ER.  Per patient's wife patient has been having poor appetite and decreased bowel movements.  Did not have any nausea vomiting chest pain shortness of breath.  Had come to the ER about 2 weeks ago for a fall at that time CT head C-spine x-rays were unrevealing. ?In ED: [id number] systolic.  Labs show progressive worsening renal function.  Creatinine on June 2021 was 1.5 it has progressively worsened to 1.9 in January 2023 and last week it was 2.6 now it is 3.05.  Hemoglobin also dropped by 2 g from last week.  Patient was given 1 L fluid bolus admitted for acute renal failure.  Patient was admitted for further management. ?He was continued on aggressive IV fluid hydration with improvement in his renal function.  He was seen by PT OT advised skilled nursing facility at this time. ?  ?  ?Subjective: ?Seen and examined this morning.  Wife is at the bedside.  Patient is having his meal, no new complaints ?Overnight afebrile, creatinine  has improved.   ? ?Assessment and Plan: ?Principal Problem: ?  ARF (acute renal failure) ([place]) ?Active Problems: ?  Type 2 diabetes mellitus ([place]) ?  Colon cancer ([place]) ?  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  AKI (acute kidney injury) ([place]) ? ?AKI on CKDIIIb-(baseline creatinine 1.9 on 09/01/21 was 1.5 on 02/06/2020) ?AKI likely multifactorial due to marginal oral intake/hctz-ACEI/constipation.  With IV fluid hydration creatinine has improved to baseline.  Continue to hold HCTZ and lisinopril.  Continue PT OT, encourage oral intake.  ?Recent Labs  ?Lab 11/08/21 ?0649 11/12/21 ?1918 11/13/21 ?4665 11/14/21 ?0430  ?BUN 60* 49* 48* 34*  ?CREATININE 2.65* 3.05* 2.62* 1.83*  ? ?Hypokalemia: Resolved ?Normocytic anemia with recent hematuria Hemoccult negative.Hemoglobin stable ?Recent Labs  ?Lab 11/08/21 ?0649 11/12/21 ?1918 11/13/21 ?9935 11/14/21 ?0430  ?HGB 14.1 12.4* 12.5* 12.0*  ?HCT 41.9 37.3* 37.4* 36.1*  ?  ?Poor oral intake ?Dehydration ?Abdominal pain? ?constipation for 3-4 days: ?Patient has not a bowel movement, stool softener as needed, Augmentin diet increase PT OT Encourage oral intake.  CT renal stone 3/21 with cholelithiasis remote right hemicolectomy, chronic prostate enlargement no acute finding ? ?Hypotension at home ?Hypertension: ?HCTZ/lisinopril on hold.  BP remains stable ? ?Type 2 diabetes mellitus: ?Monitor CBG with SSI.STABLE. ?Recent Labs  ?Lab 11/13/21 ?1134 11/13/21 ?1635 11/13/21 ?2106 11/14/21 ?0727 11/14/21 ?1136  ?GLUCAP 196* 115* 138* 137*  168*  ?  ?Advanced dementia on Aricept-mental status stable,Lives with wife. ?History of stroke on statin, monitor CK ?Cholelithiasis seen on the CT scan recently done ? ?DVT prophylaxis: heparin injection 5,000 Units Start: 11/12/21 2230 ?Code Status:   Code Status: DNR ?Family Communication: plan of care discussed  with patient/wife at bedside. ?Patient status is: Inpatient level of care: Telemetry  ?Remains hospitalized for ongoing management of his AKI.  PTOT and for a SNF disposition ?Patient currently not stable ? ?Dispo: The patient is from: home ?           Anticipated disposition: Skilled facility likely tomorrow once bed available ? ?Mobility Assessment (last 72 hours)   ? ? Mobility Assessment   ? ? [place] Name 11/14/21 1141 11/13/21 2030 11/13/21 1426 11/13/21 1100 11/13/21 0800  ? Does patient have an order for bedrest or is patient medically unstable -- No - Continue assessment -- -- No - Continue assessment  ? What is the highest level of mobility based on the progressive mobility assessment? Level 4 (Walks with assist in room) - Balance while marching in place and cannot step forward and back - Complete Level 4 (Walks with assist in room) - Balance while marching in place and cannot step forward and back - Complete Level 4 (Walks with assist in room) - Balance while marching in place and cannot step forward and back - Complete Level 5 (Walks with assist in room/hall) - Balance while stepping forward/back and can walk in room with assist - Complete Level 2 ([place]) - Balance while sitting on edge of bed and cannot stand  ? Is the above level different from baseline mobility prior to current illness? -- Yes - Recommend PT order -- -- Yes - Recommend PT order  ? ? [place] Name 11/12/21 2300  ?  ?  ?  ?  ? Does patient have an order for bedrest or is patient medically unstable No - Continue assessment      ? What is the highest level of mobility based on the progressive mobility assessment? Level 2 ([place]) - Balance while sitting on edge of bed and cannot stand      ? Is the above level different from baseline mobility prior to current illness? Yes - Recommend PT order      ? ?  ?  ? ?  ?  ? ?Objective: ?Vitals last 24 hrs: ?Vitals:  ? 11/13/21 1330 11/13/21 2104 11/14/21 0410 11/14/21 0500  ?BP: 133/61 (!) 122/59  125/63   ?Pulse: 88 (!) 55 (!) 59   ?Resp: 16 16 16    ?Temp: 98.1 ?F (36.7 ?C) (!) 97.3 ?F (36.3 ?C) 97.8 ?F (36.6 ?C)   ?TempSrc: Oral Oral Oral   ?SpO2: 93% 100% 98%   ?Weight:    86.5 kg  ?Height:      ? ?Weight change: 4.853 kg ? ?Physical Examination: ?General exam: AA oriented at baseline,older than stated age, weak appearing. ?HEENT:Oral mucosa moist, Ear/Nose WNL grossly, dentition normal. ?Respiratory system: bilaterally clear,no use of accessory muscle ?Cardiovascular system: S1 & S2 +, No JVD,. ?Gastrointestinal system: Abdomen soft,NT,ND, BS+ ?Nervous System:Alert, awake, moving extremities and grossly nonfocal, b/l pupil small- but reactive, able to visualize well, cataract + ?Extremities: edema neg,distal peripheral pulses palpable.  ?Skin: No rashes,no icterus. ?MSK: Normal muscle bulk,tone, power ? ? ?Medications reviewed:  ?Scheduled Meds: ? aspirin  81 mg Oral QODAY  ? [START ON 11/15/2021] atorvastatin  10 mg Oral Once per day on Tue  Thu  ? donepezil  10 mg Oral QHS  ? heparin  5,000 Units Subcutaneous Q8H  ? insulin aspart  0-6 Units Subcutaneous TID WC  ? pantoprazole  40 mg Oral Daily  ? sertraline  100 mg Oral Daily  ? tamsulosin  0.4 mg Oral Daily  ? vitamin B-12  50-100 mcg Oral Daily  ? ?Continuous Infusions: ? ? ? ?  ?Diet Order   ? ?       ?  Diet heart healthy/carb modified Room service appropriate? Yes; Fluid consistency: Thin  Diet effective now       ?  ? ?  ?  ? ?  ?  ? ?  ?  ?  ? ? ?Intake/Output Summary (Last 24 hours) at 11/14/2021 1313 ?Last data filed at 11/14/2021 0500 ?Gross per 24 hour  ?Intake 2025.74 ml  ?Output 1350 ml  ?Net 675.74 ml  ? ?Net IO Since Admission: 1,245.74 mL [11/14/21 1313]  ?Wt Readings from Last 3 Encounters:  ?11/14/21 86.5 kg  ?27-Nov-2021 81.6 kg  ?09/01/21 90.7 kg  ?  ? ?Unresulted Labs (From admission, onward)  ? ? None  ? ?  ?Data Reviewed: I have personally reviewed following labs and imaging studies ?CBC: ?Recent Labs  ?Lab 27-Nov-2021 ?0649 11/12/21 ?1918  11/13/21 ?3202 11/14/21 ?0430  ?WBC 8.3 9.6 7.9 7.7  ?NEUTROABS 5.9 7.7 5.6  --   ?HGB 14.1 12.4* 12.5* 12.0*  ?HCT 41.9 37.3* 37.4* 36.1*  ?MCV 79.1* 79.9* 79.7* 80.9  ?PLT 278 215 223 198  ? ?Basic Metabo

## 2021-11-14 NOTE — Progress Notes (Signed)
Physical Therapy Treatment ?Patient Details ?Name: Ronald Dyer ?MRN: [id number] ?DOB: [birth year] ?Today's Date: 11/14/2021 ? ? ?History of Present Illness 81 y.o. male with history of advanced dementia uses a walker to ambulate, hypertension, diabetes mellitus type 2, hx of right sided deficits s/p cvs, chronic kidney disease stage III, history of colon cancer status post right hemicolectomy was brought to the ER because patient was found to have a low blood pressure and acute renal failure. ? ?  ?PT Comments  ? ? General Comments: AxO x 1 sleepy but easily arroused.  Following repeat functional commands.  Does not feel well today.  "dizzy", "weak", "tired".  Assisted OOB to amb did not go well.  General bed mobility comments: required increased assist and time to transition to EOB.  Able to staic sit EOB at Supervision level once upright.  Poor posture. General transfer comment: required increased assist to rise.  Stood at bedside first to void in bottle.  Then required a seated rest break due to fatigue.  Reported increased dizziness with standing as well.  NT called to room to assisted with a standing BP 144/109 with HR 74. General Gait Details: amb distance limited by increased c/o "dizziness" and feeling "tired" as well as tremors throughout today.  "I just don't feel right".  Recliner brought to pt. ?Pt will need ST Rehab at SNF prior to safely retuning home with spouse. ?  ?Recommendations for follow up therapy are one component of a multi-disciplinary discharge planning process, led by the attending physician.  Recommendations may be updated based on patient status, additional functional criteria and insurance authorization. ? ?Follow Up Recommendations ? Skilled nursing-short term rehab (<3 hours/day) ?  ?  ?Assistance Recommended at Discharge Frequent or constant Supervision/Assistance  ?Patient can return home with the following A little help with walking and/or transfers;A little help with  bathing/dressing/bathroom;Assistance with cooking/housework;Assistance with feeding;Direct supervision/assist for medications management;Direct supervision/assist for financial management;Assist for transportation;Help with stairs or ramp for entrance ?  ?Equipment Recommendations ? None recommended by PT  ?  ?Recommendations for Other Services   ? ? ?  ?Precautions / Restrictions Precautions ?Precautions: Fall ?Precaution Comments: reports "blurry vision" S/P CVA R hemiparesis but was using a walker to amb prior to admit. ?Restrictions ?Weight Bearing Restrictions: No  ?  ? ?Mobility ? Bed Mobility ?Overal bed mobility: Needs Assistance ?Bed Mobility: Supine to Sit ?  ?  ?Supine to sit: Mod assist, Max assist ?  ?  ?General bed mobility comments: required increased assist and time to transition to EOB.  Able to staic sit EOB at Supervision level once upright.  Poor posture. ?  ? ?Transfers ?Overall transfer level: Needs assistance ?Equipment used: Rolling walker (2 wheels) ?Transfers: Sit to/from Stand ?Sit to Stand: Min assist, Mod assist ?  ?  ?  ?  ?  ?General transfer comment: required increased assist to rise.  Stood at bedside first to void in bottle.  Then required a seated rest break due to fatigue.  Reported increased dizziness with standing as well.  NT called to room to assisted with a standing BP 144/109 with HR 74. ?  ? ?Ambulation/Gait ?Ambulation/Gait assistance: Mod assist ?Gait Distance (Feet): 5 Feet ?Assistive device: Rolling walker (2 wheels) ?Gait Pattern/deviations: Step-through pattern, Decreased stride length, Narrow base of support, Scissoring ?Gait velocity: decr ?  ?  ?General Gait Details: amb distance limited by increased c/o "dizziness" and feeling "tired" as well as tremors throughout today.  "I just don't  feel right".  Recliner brought to pt. ? ? ?Stairs ?  ?  ?  ?  ?  ? ? ?Wheelchair Mobility ?  ? ?Modified Rankin (Stroke Patients Only) ?  ? ? ?  ?Balance   ?  ?  ?  ?  ?  ?  ?  ?  ?   ?  ?  ?  ?  ?  ?  ?  ?  ?  ?  ? ?  ?Cognition Arousal/Alertness: Awake/alert ?Behavior During Therapy: [facility] for tasks assessed/performed ?Overall Cognitive Status: No family/caregiver present to determine baseline cognitive functioning ?  ?  ?  ?  ?  ?  ?  ?  ?  ?  ?  ?  ?  ?  ?  ?  ?General Comments: AxO x 1 sleepy but easily arroused.  Following repeat functional commands.  Does not feel well today.  "dizzy", "weak", "tired". ?  ?  ? ?  ?Exercises   ? ?  ?General Comments   ?  ?  ? ?Pertinent Vitals/Pain Pain Assessment ?Pain Assessment: No/denies pain  ? ? ?Home Living   ?  ?  ?  ?  ?  ?  ?  ?  ?  ?   ?  ?Prior Function    ?  ?  ?   ? ?PT Goals (current goals can now be found in the care plan section) Progress towards PT goals: Progressing toward goals ? ?  ?Frequency ? ? ? Min 2X/week ? ? ? ?  ?PT Plan Current plan remains appropriate  ? ? ?Co-evaluation   ?  ?  ?  ?  ? ?  ?AM-PAC PT "6 Clicks" Mobility   ?Outcome Measure ? Help needed turning from your back to your side while in a flat bed without using bedrails?: A Lot ?Help needed moving from lying on your back to sitting on the side of a flat bed without using bedrails?: A Lot ?Help needed moving to and from a bed to a chair (including a wheelchair)?: A Lot ?Help needed standing up from a chair using your arms (e.g., wheelchair or bedside chair)?: A Lot ?Help needed to walk in hospital room?: A Lot ?Help needed climbing 3-5 steps with a railing? : Total ?6 Click Score: 11 ? ?  ?End of Session Equipment Utilized During Treatment: Gait belt ?Activity Tolerance: Patient limited by fatigue ?Patient left: in chair;with call bell/phone within reach;with family/visitor present ?Nurse Communication: Mobility status ?PT Visit Diagnosis: Unsteadiness on feet (R26.81);Repeated falls (R29.6);Other abnormalities of gait and mobility (R26.89) ?  ? ? ?Time: 2951-8841 ?PT Time Calculation (min) (ACUTE ONLY): 22 min ? ?Charges:  $Gait Training: 8-22 mins          ?           ? ?{Syerra Abdelrahman  PTA ?Acute  Rehabilitation Services ?Pager      [phone] ?Office      [phone] ? ?

## 2021-11-15 DIAGNOSIS — N179 Acute kidney failure, unspecified: Secondary | ICD-10-CM | POA: Diagnosis not present

## 2021-11-15 DIAGNOSIS — E440 Moderate protein-calorie malnutrition: Secondary | ICD-10-CM | POA: Insufficient documentation

## 2021-11-15 LAB — CBC
HCT: 36.9 % — ABNORMAL LOW (ref 39.0–52.0)
Hemoglobin: 12.3 g/dL — ABNORMAL LOW (ref 13.0–17.0)
MCH: 26.4 pg (ref 26.0–34.0)
MCHC: 33.3 g/dL (ref 30.0–36.0)
MCV: 79.2 fL — ABNORMAL LOW (ref 80.0–100.0)
Platelets: 191 10*3/uL (ref 150–400)
RBC: 4.66 MIL/uL (ref 4.22–5.81)
RDW: 14.9 % (ref 11.5–15.5)
WBC: 8.5 10*3/uL (ref 4.0–10.5)
nRBC: 0 % (ref 0.0–0.2)

## 2021-11-15 LAB — GLUCOSE, CAPILLARY
Glucose-Capillary: 192 mg/dL — ABNORMAL HIGH (ref 70–99)
Glucose-Capillary: 353 mg/dL — ABNORMAL HIGH (ref 70–99)

## 2021-11-15 LAB — BASIC METABOLIC PANEL
Anion gap: 8 (ref 5–15)
BUN: 26 mg/dL — ABNORMAL HIGH (ref 8–23)
CO2: 25 mmol/L (ref 22–32)
Calcium: 9.4 mg/dL (ref 8.9–10.3)
Chloride: 105 mmol/L (ref 98–111)
Creatinine, Ser: 1.38 mg/dL — ABNORMAL HIGH (ref 0.61–1.24)
GFR, Estimated: 51 mL/min — ABNORMAL LOW (ref >60)
Glucose, Bld: 118 mg/dL — ABNORMAL HIGH (ref 70–99)
Potassium: 3.8 mmol/L (ref 3.5–5.1)
Sodium: 138 mmol/L (ref 135–145)

## 2021-11-15 NOTE — Discharge Summary (Addendum)
Physician Discharge Summary  ?Reason [place] OVF:[id number] DOB: 06-10-1941 DOA: 11/12/2021 ? ?PCP: [name], MD ? ?Admit date: 11/12/2021 ?Discharge date: 11/17/2021 ?Recommendations for Outpatient Follow-up:  ?Follow up with PCP in 1 weeks-call for appointment ?Please obtain BMP/CBC in one week ? ?Discharge Dispo: SNF ?Discharge Condition: Stable ?Code Status:   Code Status: DNR ?Diet recommendation:  ?Diet Order   ? ?       ?  Diet Carb Modified Fluid consistency: Thin; Room service appropriate? Yes  Diet effective now       ?  ?  Diet Carb Modified       ?  ? ?  ?  ? ?  ?  ? ?Brief/Interim Summary: ? 81 y.o. male with history of advanced dementia uses a walker to ambulate, hypertension, diabetes mellitus type 2, chronic kidney disease stage III, history of colon cancer status post right hemicolectomy was brought to the ER because patient was found to have a low blood pressure.  Patient was in the ER about 4 days ago for concern for rectal bleeding at that time it was noted that patient actually had hematuria and was referred to urologist.  Per patient's wife who provided the history patient did go to urologist 2 days PTA, had procedure likely could be cystoscopy and was told that there was no active bleeding.  Has follow-up with urologist.  Over the last 2 days PTA patient's wife noticed that patient blood pressure was less than 90 systolic. when patient's home health aide came and checked her blood pressure was low and patient was brought to the ER.  Per patient's wife patient has been having poor appetite and decreased bowel movements.  Did not have any nausea vomiting chest pain shortness of breath.  Had come to the ER about 2 weeks ago for a fall at that time CT head C-spine x-rays were unrevealing. ?In ED: AC-166 systolic.  Labs show progressive worsening renal function.  Creatinine on June 2021 was 1.5 it has progressively worsened to 1.9 in January 2023 and last week it was 2.6 now it is 3.05.   Hemoglobin also dropped by 2 g from last week.  Patient was given 1 L fluid bolus admitted for acute renal failure.  Patient was admitted for further management. ?He was continued on aggressive IV fluid hydration with improvement in his renal function.  He was seen by PT OT advised skilled nursing facility at this time. ?Mentation is stable at baseline, creatinine improved to 1.3.  He remains medically stable discharge to skilled nursing facility once bed available  ?Patient has been waiting for skilled nursing facility and finally insurance approval obtained 3/30 and is being discharged.  His creatinine remains stable blood sugar is stable he is medically stable for discharge to the facility ? ?Discharge Diagnoses:  ?Principal Problem: ?  ARF (acute renal failure) ([place]) ?Active Problems: ?  Type 2 diabetes mellitus ([place]) ?  Colon cancer ([place]) ?  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  AKI (acute kidney injury) ([place]) ?  Malnutrition of moderate degree ? ?AKI on CKDIIIb-(baseline creatinine 1.9 on 09/01/21 was 1.5 on 02/06/2020) ?AKI likely multifactorial due to marginal oral intake/hctz-ACEI/constipation.  With IV fluid hydration creatinine has improved to baseline/better.  Encourage oral intake.  Discontinued HCTZ and lisinopril-and BP appears stable at this time. ?Recent Labs  ?Lab 11/12/21 ?1918 11/13/21 ?0630 11/14/21 ?0430 11/15/21 ?1601  ?  BUN 49* 48* 34* 26*  ?CREATININE 3.05* 2.62* 1.83* 1.38*  ? ?Hypokalemia: Resolved ? ?Normocytic anemia with recent hematuria Hemoccult negative.Hemoglobin stable ?Recent Labs  ?Lab 11/12/21 ?1918 11/13/21 ?2778 11/14/21 ?0430 11/15/21 ?2423  ?HGB 12.4* 12.5* 12.0* 12.3*  ?HCT 37.3* 37.4* 36.1* 36.9*  ?   ?Poor oral intake ?Dehydration ?Abdominal pain? ?constipation for 3-4 days: ?Encourage oral intake, continue stool softener/laxatives, PPI ?CT  renal stone 3/21 with cholelithiasis remote right hemicolectomy, chronic prostate enlargement no acute finding ?  ?Hypotension at home ?Hypertension: ?HCTZ/lisinopril on hold.  BP remains stable.  Follow up outpatient ?  ?Type 2 diabetes mellitus: Blood sugars are stable he will resume his Jardiance/ and consider resuming his low-dose glipizide 2.5 mg-wife not sure if he was still taking at home.  Currently on hold.Change to low carb diet as there was isolated elevation of his blood sugar this morning after food ?Recent Labs  ?Lab 11/16/21 ?0752 11/16/21 ?1205 11/16/21 ?1635 11/16/21 ?2001 11/17/21 ?0810  ?GLUCAP 143* 130* 170* 187* 129*  ?  ?Advanced dementia on Aricept-mental status stable,Lives with wife. ?History of stroke on statin, monitor CK ?Cholelithiasis seen on the CT scan recently done ? ?Consults: ?none ?Subjective: ?Alert awake oriented to self people pleasant at baseline mental status with dementia.  Wife is at the bedside.  Agreeable for discharge to skilled nursing facility. ? ?Discharge Exam: ?Vitals:  ? 11/16/21 1959 11/17/21 0350  ?BP: 117/66 109/61  ?Pulse: 67 66  ?Resp: 17 20  ?Temp: 98.1 ?F (36.7 ?C) 97.8 ?F (36.6 ?C)  ?SpO2: 98% 95%  ? ?General: Pt is alert, awake, not in acute distress ?Cardiovascular: RRR, S1/S2 +, no rubs, no gallops ?Respiratory: CTA bilaterally, no wheezing, no rhonchi ?Abdominal: Soft, NT, ND, bowel sounds + ?Extremities: no edema, no cyanosis ? ?Discharge Instructions ? ?Discharge Instructions   ? ? Diet Carb Modified   Complete by: As directed ?  ? Discharge instructions   Complete by: As directed ?  ? Follow-up with PCP encouraged to hold blood pressure medication until then ? ?Please call call MD or return to ER for similar or worsening recurring problem that brought you to hospital or if any fever,nausea/vomiting,abdominal pain, uncontrolled pain, chest pain,  shortness of breath or any other alarming symptoms. ? ?Please follow-up your doctor as instructed in a  week time and call the office for appointment. ? ?Please avoid alcohol, smoking, or any other illicit substance and maintain healthy habits including taking your regular medications as prescribed. ? ?You were cared for by a hospitalist during your hospital stay. If you have any questions about your discharge medications or the care you received while you were in the hospital after you are discharged, you can call the unit and ask to speak with the hospitalist on call if the hospitalist that took care of you is not available. ? ?Once you are discharged, your primary care physician will handle any further medical issues. Please note that NO REFILLS for any discharge medications will be authorized once you are discharged, as it is imperative that you return to your primary care physician (or establish a relationship with a primary care physician if you do not have one) for your aftercare needs so that they can reassess your need for medications and monitor your lab values  ? Increase activity slowly   Complete by: As directed ?  ? ?  ? ?Allergies as of 11/17/2021   ? ?   Reactions  ? Metformin Hcl Diarrhea  ?  Simvastatin Other (See Comments)  ? High CPK   ? ?  ? ?  ?Medication List  ?  ? ?STOP taking these medications   ? ?glimepiride 4 MG tablet ?Commonly known as: AMARYL ?  ?glipiZIDE 5 MG 24 hr tablet ?Commonly known as: GLUCOTROL XL ?  ?hydrochlorothiazide 25 MG tablet ?Commonly known as: HYDRODIURIL ?  ?lisinopril 20 MG tablet ?Commonly known as: ZESTRIL ?  ?pioglitazone 45 MG tablet ?Commonly known as: ACTOS ?  ? ?  ? ?TAKE these medications   ? ?Accu-Chek SmartView test strip ?Generic drug: glucose blood ?  ?aspirin 81 MG tablet ?Take 81 mg by mouth every other day. ?  ?atorvastatin 10 MG tablet ?Commonly known as: LIPITOR ?Take 10 mg by mouth See admin instructions. Take 10 mg by mouth on Tuesday(s) and Thursday(s) ?  ?donepezil 10 MG tablet ?Commonly known as: ARICEPT ?TAKE ONE TABLET BY MOUTH EVERYDAY AT  BEDTIME ?What changed: See the new instructions. ?  ?Jardiance 25 MG Tabs tablet ?Generic drug: empagliflozin ?Take 25 mg by mouth daily. ?  ?memantine tablet pack ?Commonly known as: Namenda Titration Pak ?5 mg/day

## 2021-11-15 NOTE — Consult Note (Signed)
[facility] CM Inpatient Consult ? ? ?11/15/2021 ? ?[place] ?07-01-1941 ?[id number] ? ?[place] [facility] CM) ?  ?Patient chart reviewed for post hospital chronic care management needs with [facility] CM needs. Noted high risk score for unplanned readmission. Patient is active with [facility] CM RN case manager for disease management services. ? ?Per review, current disposition plan is for SNF. Will update community care manager of patient progress. ? ?Of note, [facility] Care Management services does not replace or interfere with any services that are arranged by inpatient case management or social work.  ? ?Netta Cedars, MSN, RN ?[facility] Liaison ?Toll free office [phone] ?

## 2021-11-15 NOTE — [place] FL2 (Signed)
?[place] MEDICAID FL2 LEVEL OF CARE SCREENING TOOL  ?  ? ?IDENTIFICATION  ?Patient Name: ?Ronald Dyer Birthdate: August 05, 1941 Sex: male Admission Date (Current Location): ?11/12/2021  ?[place] and [place] Number: ? [place] ?  Facility and Address:  ?[facility],  [place], [place] ?     Provider Number: ?[id number]  ?Attending Physician Name and Address:  ?Antonieta Pert, MD ? Relative Name and Phone Number:  ?Efren, Kross Spouse [phone] ?   ?Current Level of Care: ?Hospital Recommended Level of Care: ?[place] Prior Approval Number: ?  ? ?Date Approved/Denied: ?  PASRR Number: ?[id number] ? ?Discharge Plan: ?SNF ?  ? ?Current Diagnoses: ?Patient Active Problem List  ? Diagnosis Date Noted  ? Malnutrition of moderate degree 11/15/2021  ? AKI (acute kidney injury) ([place]) 11/13/2021  ? ARF (acute renal failure) ([place]) 11/12/2021  ? Abnormal loss of weight 11/09/2020  ? Goals of care, counseling/discussion   ? Palliative care by specialist   ? Syncope 02/04/2020  ? BPH (benign prostatic hyperplasia) 02/04/2020  ? Confusion and disorientation 10/02/2019  ? 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 Aphasia as late effect of cerebrovascular accident 04/24/2019  ? Gait disturbance, post-stroke 04/24/2019  ? 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 CVA (cerebral vascular accident) ([place]) 06/22/2017  ? Colon cancer ([place]) 03/25/2014  ? Type 2 diabetes mellitus ([place]) 10/18/2006  ? Depression 10/18/2006  ? ALCOHOL ABUSE, UNSPECIFIED 10/18/2006  ? COPD 10/18/2006  ? GASTROESOPHAGEAL REFLUX, NO ESOPHAGITIS 10/18/2006  ? ? ?Orientation RESPIRATION BLADDER Height & Weight   ?  ?Self, Place ? Normal Incontinent Weight: 190 lb 11.2 oz (86.5 kg) ?Height:  6' (182.9 cm)  ?BEHAVIORAL SYMPTOMS/MOOD NEUROLOGICAL BOWEL NUTRITION STATUS  ?    Continent Diet  ?AMBULATORY  STATUS COMMUNICATION OF NEEDS Skin   ?Limited Assist Verbally Normal ?  ?  ?  ?    ?     ?     ? ? ?Personal Care Assistance Level of Assistance  ?Bathing, Dressing, Feeding Bathing Assistance: Limited assistance ?Feeding assistance: Independent ?Dressing Assistance: Limited assistance ?   ? ?Functional Limitations Info  ?Sight, Hearing, Speech Sight Info: Adequate ?Hearing Info: Adequate ?Speech Info: Adequate  ? ? ?SPECIAL CARE FACTORS FREQUENCY  ?PT (By licensed PT), OT (By licensed OT)   ?  ?PT Frequency: Minimum 5x a week ?OT Frequency: Minimum 5x a week ?  ?  ?  ?   ? ? ?Contractures Contractures Info: Not present  ? ? ?Additional Factors Info  ?Code Status, Allergies, Insulin Sliding Scale, Psychotropic Code Status Info: DNR ?Allergies Info: Metformin Hcl   Simvastatin ?Psychotropic Info: sertraline (ZOLOFT) tablet 100 mg ?Insulin Sliding Scale Info: insulin aspart (novoLOG) injection 0-6 Units 3x a day with meals ?  ?   ? ?Current Medications (11/15/2021):  This is the current hospital active medication list ?Current Facility-Administered Medications  ?Medication Dose Route Frequency Provider Last Rate Last Admin  ? acetaminophen (TYLENOL) tablet 650 mg  650 mg Oral Q6H PRN Rise Patience, MD   650 mg at 11/12/21 2350  ? Or  ? acetaminophen (TYLENOL) suppository 650 mg  650 mg Rectal Q6H PRN Rise Patience, MD      ? aspirin chewable tablet 81 mg  81 mg Oral Joellyn Quails, MD   81 mg at 11/15/21 1005  ? atorvastatin (LIPITOR) tablet 10 mg  10 mg  Oral Once per day on Tue Thu Kakrakandy, Arshad N, MD   10 mg at 11/15/21 1012  ? donepezil (ARICEPT) tablet 10 mg  10 mg Oral QHS Rise Patience, MD   10 mg at 11/14/21 2150  ? feeding supplement (BOOST / RESOURCE BREEZE) liquid 1 Container  1 Container Oral TID BM Antonieta Pert, MD   1 Container at 11/15/21 1004  ? heparin injection 5,000 Units  5,000 Units Subcutaneous Q8H Rise Patience, MD   5,000 Units at 11/15/21 5830  ?  hydrALAZINE (APRESOLINE) injection 10 mg  10 mg Intravenous Q4H PRN Rise Patience, MD      ? insulin aspart (novoLOG) injection 0-6 Units  0-6 Units Subcutaneous TID WC Rise Patience, MD   1 Units at 11/14/21 1721  ? multivitamin with minerals tablet 1 tablet  1 tablet Oral Daily Antonieta Pert, MD   1 tablet at 11/15/21 1005  ? pantoprazole (PROTONIX) EC tablet 40 mg  40 mg Oral Daily Rise Patience, MD   40 mg at 11/15/21 1005  ? sertraline (ZOLOFT) tablet 100 mg  100 mg Oral Daily Rise Patience, MD   100 mg at 11/15/21 1006  ? tamsulosin (FLOMAX) capsule 0.4 mg  0.4 mg Oral Daily Rise Patience, MD   0.4 mg at 11/15/21 1005  ? vitamin B-12 (CYANOCOBALAMIN) tablet 50-100 mcg  50-100 mcg Oral Daily Rise Patience, MD   100 mcg at 11/15/21 1005  ? ? ? ?Discharge Medications: ?Please see discharge summary for a list of discharge medications. ? ?Relevant Imaging Results: ? ?Relevant Lab Results: ? ? ?Additional Information ?SSN [id number] ? ?Ross Ludwig, LCSW ? ? ? ? ?

## 2021-11-15 NOTE — TOC Initial Note (Signed)
Transition of Care (TOC) - Initial/Assessment Note  ? ? ?Patient Details  ?Name: Ronald Dyer ?MRN: [id number] ?Date of Birth: 04-12-1941 ? ?Transition of Care (TOC) CM/SW Contact:    ?Ross Ludwig, LCSW ?Phone Number: ?11/15/2021, 1:06 PM ? ?Clinical Narrative:                 ? ?Patient is an 81 year old male who is alert and oriented x2.  CSW spoke to patient and his wife to discuss SNF recommendations for rehab.  CSW explained the process and how insurance will pay for the stay at SNF.  CSW also discussed what his base line is, per patient's wife, he normally ambulates on his own with a walker, he takes himself to the bathroom, he feeds himself.  She assists with some dressing, but patient does some on his own.  CSW faxed patient's information out to SNFs, and provided bed choice, wife chose [facility].  CSW spoke to [facility] and will start insurance auth since it is regular [employer] and not managed by [employer].  CSW updated attending physician and bedside nurse. ? ?Expected Discharge Plan: [place] ?Barriers to Discharge: Ship broker, Continued Medical Work up ? ? ?Patient Goals and CMS Choice ?Patient states their goals for this hospitalization and ongoing recovery are:: Going to SNF ?CMS Medicare.gov Compare Post Acute Care list provided to:: Patient ?Choice offered to / list presented to : Patient, Spouse ? ?Expected Discharge Plan and Services ?Expected Discharge Plan: [place] ?  ?  ?  ?Living arrangements for the past 2 months: [place] ?Expected Discharge Date: 11/15/21               ?  ?  ?  ?  ?  ?  ?  ?  ?  ?  ? ?Prior Living Arrangements/Services ?Living arrangements for the past 2 months: [place] ?Lives with:: Spouse ?Patient language and need for interpreter reviewed:: Yes ?Do you feel safe going back to the place where you live?: No   Patient and family feel he needs short term rehab, before returning back home.   ?Need for Family Participation in Patient Care: Yes (Comment) ?Care giver support system in place?: No (comment) ?  ?Criminal Activity/Legal Involvement Pertinent to Current Situation/Hospitalization: No - Comment as needed ? ?Activities of Daily Living ?Home Assistive Devices/Equipment: None ?ADL Screening (condition at time of admission) ?Patient's cognitive ability adequate to safely complete daily activities?: Yes ?Is the patient deaf or have difficulty hearing?: No ?Does the patient have difficulty seeing, even when wearing glasses/contacts?: Yes ?Does the patient have difficulty concentrating, remembering, or making decisions?: Yes ?Patient able to express need for assistance with ADLs?: Yes ?Does the patient have difficulty dressing or bathing?: Yes ?Independently performs ADLs?: No ?Communication: Independent ?Dressing (OT): Needs assistance ?Grooming: Needs assistance ?Feeding: Needs assistance ?Is this a change from baseline?: Pre-admission baseline ?Bathing: Needs assistance ?Toileting: Needs assistance ?In/Out Bed: Needs assistance ?Walks in Home: Needs assistance ?Does the patient have difficulty walking or climbing stairs?: Yes ?Weakness of Legs: Both ?Weakness of Arms/Hands: Left ? ?Permission Sought/Granted ?Permission sought to share information with : Case Manager, Family Supports, Customer service manager ?Permission granted to share information with : Yes, Release of Information Signed ? Share Information with NAME: Dayyan, Ronald Spouse [phone] ? Permission granted to share info w AGENCY: SNF admissions ?   ?   ? ?Emotional Assessment ?Appearance:: Appears stated age ?Attitude/Demeanor/Rapport: Engaged ?Affect (typically observed):  Accepting, Appropriate, Pleasant, Stable ?Orientation: : Oriented to Self, Oriented to Situation ?Alcohol / Substance Use: Not Applicable ?Psych Involvement: No (comment) ? ?Admission diagnosis:  ARF (acute renal failure) ([place]) [N17.9] ?Generalized  weakness [R53.1] ?AKI (acute kidney injury) ([place]) [N17.9] ?Patient Active Problem List  ? Diagnosis Date Noted  ? Malnutrition of moderate degree 11/15/2021  ? AKI (acute kidney injury) ([place]) 11/13/2021  ? ARF (acute renal failure) ([place]) 11/12/2021  ? Abnormal loss of weight 11/09/2020  ? Goals of care, counseling/discussion   ? Palliative care by specialist   ? Syncope 02/04/2020  ? BPH (benign prostatic hyperplasia) 02/04/2020  ? Confusion and disorientation 10/02/2019  ? 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 ([place]) 04/24/2019  ? Aphasia as late effect of cerebrovascular accident 04/24/2019  ? Gait disturbance, post-stroke 04/24/2019  ? 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 CVA (cerebral vascular accident) ([place]) 06/22/2017  ? Colon cancer ([place]) 03/25/2014  ? Type 2 diabetes mellitus ([place]) 10/18/2006  ? Depression 10/18/2006  ? ALCOHOL ABUSE, UNSPECIFIED 10/18/2006  ? COPD 10/18/2006  ? GASTROESOPHAGEAL REFLUX, NO ESOPHAGITIS 10/18/2006  ? ?PCP:  [name], MD ?Pharmacy:   ?[facility] [place] ?[place] ?[place] [address] ?Phone: [phone] Fax: [phone] ? ?[facility] - [place], [place] - [address] ?[address] ?[place] [address] ?Phone: [phone] Fax: [phone] ? ? ? ? ?Social Determinants of Health (SDOH) Interventions ?  ? ?Readmission Risk Interventions ?   ? View : No data to display.  ?  ?  ?  ? ? ? ?

## 2021-11-16 DIAGNOSIS — N179 Acute kidney failure, unspecified: Secondary | ICD-10-CM | POA: Diagnosis not present

## 2021-11-16 LAB — GLUCOSE, CAPILLARY
Glucose-Capillary: 128 mg/dL — ABNORMAL HIGH (ref 70–99)
Glucose-Capillary: 206 mg/dL — ABNORMAL HIGH (ref 70–99)
Glucose-Capillary: 157 mg/dL — ABNORMAL HIGH (ref 70–99)
Glucose-Capillary: 143 mg/dL — ABNORMAL HIGH (ref 70–99)
Glucose-Capillary: 130 mg/dL — ABNORMAL HIGH (ref 70–99)
Glucose-Capillary: 170 mg/dL — ABNORMAL HIGH (ref 70–99)
Glucose-Capillary: 187 mg/dL — ABNORMAL HIGH (ref 70–99)

## 2021-11-16 NOTE — Progress Notes (Signed)
?PROGRESS NOTE ?Ronald Dyer [place]  NIO:[id number] DOB: 01-24-1941 DOA: 11/12/2021 ?PCP: [name], MD  ? ?Brief Narrative/Hospital Course: ? 81 y.o. male with history of advanced dementia uses a walker to ambulate, hypertension, diabetes mellitus type 2, chronic kidney disease stage III, history of colon cancer status post right hemicolectomy was brought to the ER because patient was found to have a low blood pressure.  Patient was in the ER about 4 days ago for concern for rectal bleeding at that time it was noted that patient actually had hematuria and was referred to urologist.  Per patient's wife who provided the history patient did go to urologist 2 days PTA, had procedure likely could be cystoscopy and was told that there was no active bleeding.  Has follow-up with urologist.  Over the last 2 days PTA patient's wife noticed that patient blood pressure was less than 90 systolic. when patient's home health aide came and checked her blood pressure was low and patient was brought to the ER.  Per patient's wife patient has been having poor appetite and decreased bowel movements.  Did not have any nausea vomiting chest pain shortness of breath.  Had come to the ER about 2 weeks ago for a fall at that time CT head C-spine x-rays were unrevealing. ?In ED: GH-829 systolic.  Labs show progressive worsening renal function.  Creatinine on June 2021 was 1.5 it has progressively worsened to 1.9 in January 2023 and last week it was 2.6 now it is 3.05.  Hemoglobin also dropped by 2 g from last week.  Patient was given 1 L fluid bolus admitted for acute renal failure.  Patient was admitted for further management. ?He was continued on aggressive IV fluid hydration with improvement in his renal function.  He was seen by PT OT advised skilled nursing facility at this time. ?Mentation is stable at baseline, creatinine improved to 1.3.  He remains medically stable discharge to skilled nursing facility once bed available ?  ?   ?Subjective: ?Seen and examined this morning.  He is alert awake oriented with baseline dementia.  Has no new complaints. ? ?Assessment and Plan: ?Principal Problem: ?  ARF (acute renal failure) ([place]) ?Active Problems: ?  Type 2 diabetes mellitus ([place]) ?  Colon cancer ([place]) ?  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  AKI (acute kidney injury) ([place]) ?  Malnutrition of moderate degree ? ?AKI on CKDIIIb-(baseline creatinine 1.9 on 09/01/21 was 1.5 on 02/06/2020) ?AKI likely multifactorial due to marginal oral intake/hctz-ACEI/constipation.  With IV fluid hydration creatinine has improved to baseline.  Discontinued HCTZ and lisinopril.  Encourage oral intake, continue PT OT and placement ?Recent Labs  ?Lab 11/12/21 ?1918 11/13/21 ?9371 11/14/21 ?0430 11/15/21 ?6967  ?BUN 49* 48* 34* 26*  ?CREATININE 3.05* 2.62* 1.83* 1.38*  ? ?Hypokalemia: Resolved ?Normocytic anemia with recent hematuria Hemoccult negative.Hemoglobin stable ?Recent Labs  ?Lab 11/12/21 ?1918 11/13/21 ?8938 11/14/21 ?0430 11/15/21 ?1017  ?HGB 12.4* 12.5* 12.0* 12.3*  ?HCT 37.3* 37.4* 36.1* 36.9*  ?    ?Poor oral intake ?Dehydration ?Abdominal pain? ?constipation for 3-4 days:Encourage oral intake, continue stool softener/laxatives, PPI ?CT renal stone 3/21 with cholelithiasis remote right hemicolectomy, chronic prostate enlargement no acute finding ? ?Hypotension at home ?Hypertension:HCTZ/lisinopril discontinued.  BP remains stable.  Follow up outpatient ? ?Type 2 diabetes mellitus: Blood sugars are stable  on  glipizide-wife not sure if he was still  taking at home.  Currently on hold.after diet change to low-carb diet blood sugar fairly stable.   ?Recent Labs  ?Lab 11/14/21 ?1136 11/14/21 ?1635 11/15/21 ?1140 11/15/21 ?1619 11/16/21 ?0752  ?GLUCAP 168* 192* 353* 206* 143*  ? ?Advanced dementia on Aricept-mental status  stable,Lives with wife. ?History of stroke on statin, monitor CK ?Cholelithiasis seen on the CT scan recently done ? ?DVT prophylaxis: heparin injection 5,000 Units Start: 11/12/21 2230 ?Code Status:   Code Status: DNR ?Family Communication: plan of care discussed with patient/wife at bedside. ?Patient status is: Inpatient level of care: Med-Surg  ?Remains hospitalized for ongoing management of his AKI.  PTOT and for a SNF disposition ?Patient currently is stable ? ?Dispo: The patient is from: home ?           Anticipated disposition: Skilled facility likely once insurance approval obtained.  Currently stable  ? ?Mobility Assessment (last 72 hours)   ? ? Mobility Assessment   ? ? [place] Name 11/15/21 21:42:43 11/15/21 2000 11/15/21 1436 11/14/21 1801 11/14/21 1141  ? Does patient have an order for bedrest or is patient medically unstable No - Continue assessment No - Continue assessment No - Continue assessment No - Continue assessment --  ? What is the highest level of mobility based on the progressive mobility assessment? Level 4 (Walks with assist in room) - Balance while marching in place and cannot step forward and back - Complete Level 4 (Walks with assist in room) - Balance while marching in place and cannot step forward and back - Complete Level 4 (Walks with assist in room) - Balance while marching in place and cannot step forward and back - Complete Level 4 (Walks with assist in room) - Balance while marching in place and cannot step forward and back - Complete Level 4 (Walks with assist in room) - Balance while marching in place and cannot step forward and back - Complete  ? Is the above level different from baseline mobility prior to current illness? Yes - Recommend PT order No - Consider discontinuing PT/OT Yes - Recommend PT order Yes - Recommend PT order --  ? ? [place] Name 11/13/21 2030 11/13/21 1426  ?  ?  ?  ? Does patient have an order for bedrest or is patient medically unstable No - Continue  assessment --     ? What is the highest level of mobility based on the progressive mobility assessment? Level 4 (Walks with assist in room) - Balance while marching in place and cannot step forward and back - Complete Level 4 (Walks with assist in room) - Balance while marching in place and cannot step forward and back - Complete     ? Is the above level different from baseline mobility prior to current illness? Yes - Recommend PT order --     ? ?  ?  ? ?  ?  ? ?Objective: ?Vitals last 24 hrs: ?Vitals:  ? 11/15/21 1332 11/15/21 2142 11/16/21 0500 11/16/21 0505  ?BP: 123/70 (!) 126/54  117/68  ?Pulse: 62 (!) 59  (!) 59  ?Resp: 16 20  18   ?Temp: 98.3 ?F (36.8 ?C) 97.6 ?F (36.4 ?C)  97.8 ?F (36.6 ?C)  ?TempSrc: Oral Oral  Oral  ?SpO2: 98% 96%  97%  ?Weight:   87.1 kg   ?Height:      ? ?Weight change:  ? ?Physical Examination: ?General exam: AA0X2, older than stated age, weak appearing. ?HEENT:Oral mucosa moist, Ear/Nose WNL grossly, dentition normal. ?  Respiratory system: bilaterally diminished, no use of accessory muscle ?Cardiovascular system: S1 & S2 +, No JVD,. ?Gastrointestinal system: Abdomen soft,NT,ND,BS+ ?Nervous System:Alert, awake, moving extremities and grossly nonfocal ?Extremities: LE ankle edema NONE, distal peripheral pulses palpable.  ?Skin: No rashes,no icterus. ?MSK: Normal muscle bulk,tone, power  ? ? ?Medications reviewed:  ?Scheduled Meds: ? aspirin  81 mg Oral QODAY  ? atorvastatin  10 mg Oral Once per day on Tue Thu  ? donepezil  10 mg Oral QHS  ? feeding supplement  1 Container Oral TID BM  ? heparin  5,000 Units Subcutaneous Q8H  ? insulin aspart  0-6 Units Subcutaneous TID WC  ? multivitamin with minerals  1 tablet Oral Daily  ? pantoprazole  40 mg Oral Daily  ? sertraline  100 mg Oral Daily  ? tamsulosin  0.4 mg Oral Daily  ? vitamin B-12  50-100 mcg Oral Daily  ? ?Continuous Infusions: ? ? ? ?  ?Diet Order   ? ?       ?  Diet Carb Modified Fluid consistency: Thin; Room service appropriate?  Yes  Diet effective now       ?  ?  Diet Carb Modified       ?  ? ?  ?  ? ?  ?  ? ?Nutrition Problem: Moderate Malnutrition ?Etiology: chronic illness (advanced dementia) ?Signs/Symptoms: mild fat depletion, mild muscle

## 2021-11-16 NOTE — Plan of Care (Signed)
?  Problem: Education: ?Goal: Knowledge of General Education information will improve ?Description: Including pain rating scale, medication(s)/side effects and non-pharmacologic comfort measures ?Outcome: Not Progressing ?  ?Problem: Health Behavior/Discharge Planning: ?Goal: Ability to manage health-related needs will improve ?Outcome: Progressing ?  ?Problem: Clinical Measurements: ?Goal: Ability to maintain clinical measurements within normal limits will improve ?Outcome: Progressing ?Goal: Will remain free from infection ?Outcome: Progressing ?Goal: Diagnostic test results will improve ?Outcome: Progressing ?Goal: Respiratory complications will improve ?Outcome: Progressing ?Goal: Cardiovascular complication will be avoided ?Outcome: Progressing ?  ?Problem: Activity: ?Goal: Risk for activity intolerance will decrease ?Outcome: Progressing ?  ?Problem: Nutrition: ?Goal: Adequate nutrition will be maintained ?Outcome: Progressing ?  ?Problem: Coping: ?Goal: Level of anxiety will decrease ?Outcome: Progressing ?  ?Problem: Elimination: ?Goal: Will not experience complications related to bowel motility ?Outcome: Progressing ?Goal: Will not experience complications related to urinary retention ?Outcome: Progressing ?  ?Problem: Pain Managment: ?Goal: General experience of comfort will improve ?Outcome: Progressing ?  ?Problem: Safety: ?Goal: Ability to remain free from injury will improve ?Outcome: Progressing ?  ?Problem: Skin Integrity: ?Goal: Risk for impaired skin integrity will decrease ?Outcome: Progressing ?  ?

## 2021-11-16 NOTE — Plan of Care (Signed)

## 2021-11-17 DIAGNOSIS — R739 Hyperglycemia, unspecified: Secondary | ICD-10-CM | POA: Diagnosis not present

## 2021-11-17 DIAGNOSIS — M6259 Muscle wasting and atrophy, not elsewhere classified, multiple sites: Secondary | ICD-10-CM | POA: Diagnosis not present

## 2021-11-17 DIAGNOSIS — M6281 Muscle weakness (generalized): Secondary | ICD-10-CM | POA: Diagnosis not present

## 2021-11-17 DIAGNOSIS — R1312 Dysphagia, oropharyngeal phase: Secondary | ICD-10-CM | POA: Diagnosis not present

## 2021-11-17 DIAGNOSIS — Z7401 Bed confinement status: Secondary | ICD-10-CM | POA: Diagnosis not present

## 2021-11-17 DIAGNOSIS — M79673 Pain in unspecified foot: Secondary | ICD-10-CM | POA: Diagnosis not present

## 2021-11-17 DIAGNOSIS — Z8673 Personal history of transient ischemic attack (TIA), and cerebral infarction without residual deficits: Secondary | ICD-10-CM | POA: Diagnosis not present

## 2021-11-17 DIAGNOSIS — I959 Hypotension, unspecified: Secondary | ICD-10-CM | POA: Diagnosis not present

## 2021-11-17 DIAGNOSIS — I679 Cerebrovascular disease, unspecified: Secondary | ICD-10-CM | POA: Diagnosis not present

## 2021-11-17 DIAGNOSIS — E119 Type 2 diabetes mellitus without complications: Secondary | ICD-10-CM | POA: Diagnosis not present

## 2021-11-17 DIAGNOSIS — R41841 Cognitive communication deficit: Secondary | ICD-10-CM | POA: Diagnosis not present

## 2021-11-17 DIAGNOSIS — R2689 Other abnormalities of gait and mobility: Secondary | ICD-10-CM | POA: Diagnosis not present

## 2021-11-17 DIAGNOSIS — R630 Anorexia: Secondary | ICD-10-CM | POA: Diagnosis not present

## 2021-11-17 DIAGNOSIS — E118 Type 2 diabetes mellitus with unspecified complications: Secondary | ICD-10-CM | POA: Diagnosis not present

## 2021-11-17 DIAGNOSIS — N179 Acute kidney failure, unspecified: Secondary | ICD-10-CM | POA: Diagnosis not present

## 2021-11-17 DIAGNOSIS — K5901 Slow transit constipation: Secondary | ICD-10-CM | POA: Diagnosis not present

## 2021-11-17 DIAGNOSIS — I150 Renovascular hypertension: Secondary | ICD-10-CM | POA: Diagnosis not present

## 2021-11-17 DIAGNOSIS — R2681 Unsteadiness on feet: Secondary | ICD-10-CM | POA: Diagnosis not present

## 2021-11-17 DIAGNOSIS — R262 Difficulty in walking, not elsewhere classified: Secondary | ICD-10-CM | POA: Diagnosis not present

## 2021-11-17 DIAGNOSIS — F03C0 Unspecified dementia, severe, without behavioral disturbance, psychotic disturbance, mood disturbance, and anxiety: Secondary | ICD-10-CM | POA: Diagnosis not present

## 2021-11-17 LAB — GLUCOSE, CAPILLARY
Glucose-Capillary: 129 mg/dL — ABNORMAL HIGH (ref 70–99)
Glucose-Capillary: 252 mg/dL — ABNORMAL HIGH (ref 70–99)

## 2021-11-17 NOTE — Progress Notes (Signed)
Physical Therapy Treatment ?Patient Details ?Name: Ronald Dyer ?MRN: [id number] ?DOB: 1940/11/23 ?Today's Date: 11/17/2021 ? ? ?History of Present Illness 81 y.o. male with history of advanced dementia uses a walker to ambulate, hypertension, diabetes mellitus type 2, hx of right sided deficits s/p cvs, chronic kidney disease stage III, history of colon cancer status post right hemicolectomy was brought to the ER because patient was found to have a low blood pressure and acute renal failure. ? ?  ?PT Comments  ? ? General Comments: AxO x 1.   Following repeat functional commands.  Pleasantly confused. ?General bed mobility comments: required increased assist and time to transition to EOB.  Able to staic sit EOB at Supervision level once upright. General transfer comment: required increased assist to rise.  Stood at bedside first to void in bottle.  Then required a seated rest break due to fatigue.  Reported increased dizziness with standing as well.  BP WNL. General Gait Details: tolerated an increased distance 27 feet with recliner following.  Max c/o feeling weak/tired. ?Pt will need ST Rehab at SNF prior to returning to home with spouse. ?  ?Recommendations for follow up therapy are one component of a multi-disciplinary discharge planning process, led by the attending physician.  Recommendations may be updated based on patient status, additional functional criteria and insurance authorization. ? ?Follow Up Recommendations ? Skilled nursing-short term rehab (<3 hours/day) ?  ?  ?Assistance Recommended at Discharge Frequent or constant Supervision/Assistance  ?Patient can return home with the following A little help with walking and/or transfers;A little help with bathing/dressing/bathroom;Assistance with cooking/housework;Assistance with feeding;Direct supervision/assist for medications management;Direct supervision/assist for financial management;Assist for transportation;Help with stairs or ramp for  entrance ?  ?Equipment Recommendations ? None recommended by PT  ?  ?Recommendations for Other Services   ? ? ?  ?Precautions / Restrictions Precautions ?Precautions: Fall ?Precaution Comments: reports "blurry vision" S/P CVA R hemiparesis but was using a walker to amb prior to admit. ?Restrictions ?Weight Bearing Restrictions: No  ?  ? ?Mobility ? Bed Mobility ?Overal bed mobility: Needs Assistance ?Bed Mobility: Supine to Sit ?  ?  ?Supine to sit: Mod assist, Max assist ?  ?  ?General bed mobility comments: required increased assist and time to transition to EOB.  Able to staic sit EOB at Supervision level once upright. ?  ? ?Transfers ?Overall transfer level: Needs assistance ?Equipment used: Rolling walker (2 wheels) ?Transfers: Sit to/from Stand ?Sit to Stand: Min assist, Mod assist ?  ?  ?  ?  ?  ?General transfer comment: required increased assist to rise.  Stood at bedside first to void in bottle.  Then required a seated rest break due to fatigue.  Reported increased dizziness with standing as well.  BP WNL. ?  ? ?Ambulation/Gait ?Ambulation/Gait assistance: Mod assist ?Gait Distance (Feet): 27 Feet ?Assistive device: Rolling walker (2 wheels) ?Gait Pattern/deviations: Step-through pattern, Decreased stride length, Narrow base of support, Scissoring ?Gait velocity: decreased ?  ?  ?General Gait Details: tolerated an increased distance 27 feet with recliner following.  Max c/o feeling weak/tired. ? ? ?Stairs ?  ?  ?  ?  ?  ? ? ?Wheelchair Mobility ?  ? ?Modified Rankin (Stroke Patients Only) ?  ? ? ?  ?Balance   ?  ?  ?  ?  ?  ?  ?  ?  ?  ?  ?  ?  ?  ?  ?  ?  ?  ?  ?  ? ?  ?  Cognition Arousal/Alertness: Awake/alert ?Behavior During Therapy: [facility] for tasks assessed/performed ?  ?  ?  ?  ?  ?  ?  ?  ?  ?  ?  ?  ?  ?  ?  ?  ?  ?General Comments: AxO x 1.   Following repeat functional commands.  Pleasantly confused. ?  ?  ? ?  ?Exercises   ? ?  ?General Comments   ?  ?  ? ?Pertinent Vitals/Pain    ? ? ?Home Living    ?  ?  ?  ?  ?  ?  ?  ?  ?  ?   ?  ?Prior Function    ?  ?  ?   ? ?PT Goals (current goals can now be found in the care plan section) Progress towards PT goals: Progressing toward goals ? ?  ?Frequency ? ? ? Min 2X/week ? ? ? ?  ?PT Plan Current plan remains appropriate  ? ? ?Co-evaluation   ?  ?  ?  ?  ? ?  ?AM-PAC PT "6 Clicks" Mobility   ?Outcome Measure ? Help needed turning from your back to your side while in a flat bed without using bedrails?: A Lot ?Help needed moving from lying on your back to sitting on the side of a flat bed without using bedrails?: A Lot ?Help needed moving to and from a bed to a chair (including a wheelchair)?: A Lot ?Help needed standing up from a chair using your arms (e.g., wheelchair or bedside chair)?: A Lot ?Help needed to walk in hospital room?: A Lot ?Help needed climbing 3-5 steps with a railing? : A Lot ?6 Click Score: 12 ? ?  ?End of Session Equipment Utilized During Treatment: Gait belt ?Activity Tolerance: Patient limited by fatigue ?Patient left: in chair;with call bell/phone within reach;with family/visitor present ?Nurse Communication: Mobility status ?PT Visit Diagnosis: Unsteadiness on feet (R26.81);Repeated falls (R29.6);Other abnormalities of gait and mobility (R26.89) ?  ? ? ?Time: 9323-5573 ?PT Time Calculation (min) (ACUTE ONLY): 26 min ? ?Charges:  $Gait Training: 8-22 mins ?$Therapeutic Activity: 8-22 mins          ?          ? ?Rica Koyanagi  PTA ?Acute  Rehabilitation Services ?Pager      [phone] ?Office      [phone] ? ?

## 2021-11-17 NOTE — TOC Transition Note (Signed)
Transition of Care (TOC) - CM/SW Discharge Note ? ? ?Patient Details  ?Name: Ronald Dyer ?MRN: [id number] ?Date of Birth: 1941/01/29 ? ?Transition of Care (TOC) CM/SW Contact:  ?Trish Mage, LCSW ?Phone Number: ?11/17/2021, 10:42 AM ? ? ?Clinical Narrative:   Patient who is stable for d/c will transition back to [facility] today. PTAR arranged.  Nursing, please call report to [phone], room 902p. TOC sign off. ? ? ? ?Final next level of care: [place] ?Barriers to Discharge: Barriers Resolved ? ? ?Patient Goals and CMS Choice ?Patient states their goals for this hospitalization and ongoing recovery are:: Going to SNF ?CMS Medicare.gov Compare Post Acute Care list provided to:: Patient ?Choice offered to / list presented to : Patient, Spouse ? ?Discharge Placement ?  ?           ?  ?  ?  ?  ? ?Discharge Plan and Services ?  ?  ?           ?  ?  ?  ?  ?  ?  ?  ?  ?  ?  ? ?Social Determinants of Health (SDOH) Interventions ?  ? ? ?Readmission Risk Interventions ?   ? View : No data to display.  ?  ?  ?  ? ? ? ? ? ?

## 2021-11-17 NOTE — Progress Notes (Signed)
I called [employer] and spoke with Clifton Custard. Report given for transfer. ?

## 2021-11-17 NOTE — Progress Notes (Signed)
?PROGRESS NOTE ?Ronald Dyer [place]  AST:[id number] DOB: 07/24/1941 DOA: 11/12/2021 ?PCP: [name], MD  ? ?Brief Narrative/Hospital Course: ? 81 y.o. male with history of advanced dementia uses a walker to ambulate, hypertension, diabetes mellitus type 2, chronic kidney disease stage III, history of colon cancer status post right hemicolectomy was brought to the ER because patient was found to have a low blood pressure.  Patient was in the ER about 4 days ago for concern for rectal bleeding at that time it was noted that patient actually had hematuria and was referred to urologist.  Per patient's wife who provided the history patient did go to urologist 2 days PTA, had procedure likely could be cystoscopy and was told that there was no active bleeding.  Has follow-up with urologist.  Over the last 2 days PTA patient's wife noticed that patient blood pressure was less than 90 systolic. when patient's home health aide came and checked her blood pressure was low and patient was brought to the ER.  Per patient's wife patient has been having poor appetite and decreased bowel movements.  Did not have any nausea vomiting chest pain shortness of breath.  Had come to the ER about 2 weeks ago for a fall at that time CT head C-spine x-rays were unrevealing. ?In ED: LG-921 systolic.  Labs show progressive worsening renal function.  Creatinine on June 2021 was 1.5 it has progressively worsened to 1.9 in January 2023 and last week it was 2.6 now it is 3.05.  Hemoglobin also dropped by 2 g from last week.  Patient was given 1 L fluid bolus admitted for acute renal failure.  Patient was admitted for further management. ?He was continued on aggressive IV fluid hydration with improvement in his renal function.  He was seen by PT OT advised skilled nursing facility at this time. ?Mentation is stable at baseline, creatinine improved to 1.3.  He remains medically stable discharge to skilled nursing facility once bed available ? ?Patient  has been waiting for skilled nursing facility and finally insurance approval obtained 3/30 and is being discharged.  His creatinine remains stable blood sugar is stable he is medically stable for discharge to the facility ?  ?  ?Subjective: ?Seen and examined this morning alert awake with baseline dementia.  No complaints  ? ?Assessment and Plan: ?Principal Problem: ?  ARF (acute renal failure) ([place]) ?Active Problems: ?  Type 2 diabetes mellitus ([place]) ?  Colon cancer ([place]) ?  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  AKI (acute kidney injury) ([place]) ?  Malnutrition of moderate degree ? ?AKI on CKDIIIb-(baseline creatinine 1.9 on 09/01/21 was 1.5 on 02/06/2020) ?AKI likely multifactorial due to marginal oral intake/hctz-ACEI/constipation.  With IV fluid hydration creatinine has improved to baseline.  Discontinued HCTZ and lisinopril.  Encourage oral intake, continue PT OT and placement-He has approval from insurance for discharge today ?Recent Labs  ?Lab 11/12/21 ?1918 11/13/21 ?1941 11/14/21 ?0430 11/15/21 ?7408  ?BUN 49* 48* 34* 26*  ?CREATININE 3.05* 2.62* 1.83* 1.38*  ? ?Hypokalemia: Resolved ?Normocytic anemia with recent hematuria Hemoccult negative.Hemoglobin stable ?Recent Labs  ?Lab 11/12/21 ?1918 11/13/21 ?1448 11/14/21 ?0430 11/15/21 ?1856  ?HGB 12.4* 12.5* 12.0* 12.3*  ?HCT 37.3* 37.4* 36.1* 36.9*  ? ?    ?Poor oral intake ?Dehydration ?Abdominal pain? ?constipation for 3-4 days:Encourage oral intake, continue stool softener/laxatives, PPI ?CT renal stone 3/21 with cholelithiasis remote right  hemicolectomy, chronic prostate enlargement no acute finding ? ?Hypotension at home ?Hypertension:HCTZ/lisinopril discontinued.  BP remains stable.  Follow up outpatient ? ?Type 2 diabetes mellitus: Blood sugars are stable  on  glipizide-wife not sure if he was still taking at home.   Currently on hold.after diet change to low-carb diet blood sugar fairly stable.   ?Recent Labs  ?Lab 11/16/21 ?0752 11/16/21 ?1205 11/16/21 ?1635 11/16/21 ?2001 11/17/21 ?0810  ?GLUCAP 143* 130* 170* 187* 129*  ? ? ?Advanced dementia on Aricept-mental status stable,Lives with wife. ?History of stroke on statin, monitor CK ?Cholelithiasis seen on the CT scan recently done ? ?DVT prophylaxis: heparin injection 5,000 Units Start: 11/12/21 2230 ?Code Status:   Code Status: DNR ?Family Communication: plan of care discussed with patient/wife at bedside. ?Patient status is: Inpatient level of care: Med-Surg  ?Remains hospitalized for ongoing management of his AKI.  PTOT and for a SNF disposition ?Patient currently is stable ? ?Dispo: The patient is from: home ?           Anticipated disposition: Insurance approval obtained ?He is being discharged to SNF today ? ?Mobility Assessment (last 72 hours)   ? ? Mobility Assessment   ? ? [place] Name 11/15/21 21:42:43 11/15/21 2000 11/15/21 1436 11/14/21 1801 11/14/21 1141  ? Does patient have an order for bedrest or is patient medically unstable No - Continue assessment No - Continue assessment No - Continue assessment No - Continue assessment --  ? What is the highest level of mobility based on the progressive mobility assessment? Level 4 (Walks with assist in room) - Balance while marching in place and cannot step forward and back - Complete Level 4 (Walks with assist in room) - Balance while marching in place and cannot step forward and back - Complete Level 4 (Walks with assist in room) - Balance while marching in place and cannot step forward and back - Complete Level 4 (Walks with assist in room) - Balance while marching in place and cannot step forward and back - Complete Level 4 (Walks with assist in room) - Balance while marching in place and cannot step forward and back - Complete  ? Is the above level different from baseline mobility prior to current illness? Yes -  Recommend PT order No - Consider discontinuing PT/OT Yes - Recommend PT order Yes - Recommend PT order --  ? ?  ?  ? ?  ?  ? ?Objective: ?Vitals last 24 hrs: ?Vitals:  ? 11/16/21 0505 11/16/21 1208 11/16/21 1959 11/17/21 0350  ?BP: 117/68 111/62 117/66 109/61  ?Pulse: (!) 59 (!) 50 67 66  ?Resp: 18 18 17 20   ?Temp: 97.8 ?F (36.6 ?C) 97.8 ?F (36.6 ?C) 98.1 ?F (36.7 ?C) 97.8 ?F (36.6 ?C)  ?TempSrc: Oral Oral    ?SpO2: 97% 97% 98% 95%  ?Weight:    87.9 kg  ?Height:      ? ?Weight change: 0.8 kg ? ?Physical Examination: ?General exam: AA OX2,older than stated age, weak appearing. ?HEENT:Oral mucosa moist, Ear/Nose WNL grossly, dentition normal. ?Respiratory system: bilaterally clear no use of accessory muscle ?Cardiovascular system: S1 & S2 +, No JVD,. ?Gastrointestinal system: Abdomen soft,NT,ND, BS+ ?Nervous System:Alert, awake, moving extremities and grossly nonfocal ?Extremities: edema neg,distal peripheral pulses palpable.  ?Skin: No rashes,no icterus. ?MSK: Normal muscle bulk,tone, power ? ? ? ?Medications reviewed:  ?Scheduled Meds: ? aspirin  81 mg Oral QODAY  ? atorvastatin  10 mg Oral Once per day on Tue Thu  ? donepezil  10 mg Oral QHS  ? feeding supplement  1 Container Oral TID BM  ? heparin  5,000 Units Subcutaneous Q8H  ? insulin aspart  0-6 Units Subcutaneous TID WC  ? multivitamin with minerals  1 tablet Oral Daily  ? pantoprazole  40 mg Oral Daily  ? sertraline  100 mg Oral Daily  ? tamsulosin  0.4 mg Oral Daily  ? vitamin B-12  50-100 mcg Oral Daily  ? ?Continuous Infusions: ? ? ? ?  ?Diet Order   ? ?       ?  Diet Carb Modified Fluid consistency: Thin; Room service appropriate? Yes  Diet effective now       ?  ?  Diet Carb Modified       ?  ? ?  ?  ? ?  ?  ? ?Nutrition Problem: Moderate Malnutrition ?Etiology: chronic illness (advanced dementia) ?Signs/Symptoms: mild fat depletion, mild muscle depletion ?Interventions: Liberalize Diet, Boost Breeze, Magic cup ? ? ?Intake/Output Summary (Last 24 hours)  at 11/17/2021 1032 ?Last data filed at 11/17/2021 0137 ?Gross per 24 hour  ?Intake 240 ml  ?Output 600 ml  ?Net -360 ml  ? ? ?Net IO Since Admission: -1,097.26 mL [11/17/21 1032]  ?Wt Readings from Last 3 Encounter

## 2021-11-18 DIAGNOSIS — N179 Acute kidney failure, unspecified: Secondary | ICD-10-CM | POA: Diagnosis not present

## 2021-11-18 DIAGNOSIS — F03C0 Unspecified dementia, severe, without behavioral disturbance, psychotic disturbance, mood disturbance, and anxiety: Secondary | ICD-10-CM | POA: Diagnosis not present

## 2021-11-18 DIAGNOSIS — R262 Difficulty in walking, not elsewhere classified: Secondary | ICD-10-CM | POA: Diagnosis not present

## 2021-11-18 DIAGNOSIS — E118 Type 2 diabetes mellitus with unspecified complications: Secondary | ICD-10-CM | POA: Diagnosis not present

## 2021-11-18 DIAGNOSIS — I959 Hypotension, unspecified: Secondary | ICD-10-CM | POA: Diagnosis not present

## 2021-11-18 DIAGNOSIS — K5901 Slow transit constipation: Secondary | ICD-10-CM | POA: Diagnosis not present

## 2021-11-18 DIAGNOSIS — M79673 Pain in unspecified foot: Secondary | ICD-10-CM | POA: Diagnosis not present

## 2021-11-18 DIAGNOSIS — I150 Renovascular hypertension: Secondary | ICD-10-CM | POA: Diagnosis not present

## 2021-11-18 DIAGNOSIS — R630 Anorexia: Secondary | ICD-10-CM | POA: Diagnosis not present

## 2021-11-24 DIAGNOSIS — I679 Cerebrovascular disease, unspecified: Secondary | ICD-10-CM | POA: Diagnosis not present

## 2021-11-24 DIAGNOSIS — I150 Renovascular hypertension: Secondary | ICD-10-CM | POA: Diagnosis not present

## 2021-11-24 DIAGNOSIS — F03C0 Unspecified dementia, severe, without behavioral disturbance, psychotic disturbance, mood disturbance, and anxiety: Secondary | ICD-10-CM | POA: Diagnosis not present

## 2021-11-24 DIAGNOSIS — I959 Hypotension, unspecified: Secondary | ICD-10-CM | POA: Diagnosis not present

## 2021-11-28 DIAGNOSIS — R2681 Unsteadiness on feet: Secondary | ICD-10-CM | POA: Diagnosis not present

## 2021-11-28 DIAGNOSIS — R1312 Dysphagia, oropharyngeal phase: Secondary | ICD-10-CM | POA: Diagnosis not present

## 2021-11-28 DIAGNOSIS — I959 Hypotension, unspecified: Secondary | ICD-10-CM | POA: Diagnosis not present

## 2021-11-28 DIAGNOSIS — N179 Acute kidney failure, unspecified: Secondary | ICD-10-CM | POA: Diagnosis not present

## 2021-11-28 DIAGNOSIS — M6281 Muscle weakness (generalized): Secondary | ICD-10-CM | POA: Diagnosis not present

## 2021-11-28 DIAGNOSIS — R41841 Cognitive communication deficit: Secondary | ICD-10-CM | POA: Diagnosis not present

## 2021-11-28 DIAGNOSIS — E119 Type 2 diabetes mellitus without complications: Secondary | ICD-10-CM | POA: Diagnosis not present

## 2021-11-30 DIAGNOSIS — R41841 Cognitive communication deficit: Secondary | ICD-10-CM | POA: Diagnosis not present

## 2021-11-30 DIAGNOSIS — E119 Type 2 diabetes mellitus without complications: Secondary | ICD-10-CM | POA: Diagnosis not present

## 2021-11-30 DIAGNOSIS — N179 Acute kidney failure, unspecified: Secondary | ICD-10-CM | POA: Diagnosis not present

## 2021-11-30 DIAGNOSIS — R2681 Unsteadiness on feet: Secondary | ICD-10-CM | POA: Diagnosis not present

## 2021-11-30 DIAGNOSIS — M6281 Muscle weakness (generalized): Secondary | ICD-10-CM | POA: Diagnosis not present

## 2021-11-30 DIAGNOSIS — R1312 Dysphagia, oropharyngeal phase: Secondary | ICD-10-CM | POA: Diagnosis not present

## 2021-11-30 DIAGNOSIS — I959 Hypotension, unspecified: Secondary | ICD-10-CM | POA: Diagnosis not present

## 2021-12-01 DIAGNOSIS — R2681 Unsteadiness on feet: Secondary | ICD-10-CM | POA: Diagnosis not present

## 2021-12-01 DIAGNOSIS — E119 Type 2 diabetes mellitus without complications: Secondary | ICD-10-CM | POA: Diagnosis not present

## 2021-12-01 DIAGNOSIS — M6281 Muscle weakness (generalized): Secondary | ICD-10-CM | POA: Diagnosis not present

## 2021-12-01 DIAGNOSIS — I959 Hypotension, unspecified: Secondary | ICD-10-CM | POA: Diagnosis not present

## 2021-12-01 DIAGNOSIS — N179 Acute kidney failure, unspecified: Secondary | ICD-10-CM | POA: Diagnosis not present

## 2021-12-01 DIAGNOSIS — R1312 Dysphagia, oropharyngeal phase: Secondary | ICD-10-CM | POA: Diagnosis not present

## 2021-12-01 DIAGNOSIS — R41841 Cognitive communication deficit: Secondary | ICD-10-CM | POA: Diagnosis not present

## 2021-12-02 DIAGNOSIS — K5901 Slow transit constipation: Secondary | ICD-10-CM | POA: Diagnosis not present

## 2021-12-02 DIAGNOSIS — I679 Cerebrovascular disease, unspecified: Secondary | ICD-10-CM | POA: Diagnosis not present

## 2021-12-02 DIAGNOSIS — I959 Hypotension, unspecified: Secondary | ICD-10-CM | POA: Diagnosis not present

## 2021-12-02 DIAGNOSIS — F03C0 Unspecified dementia, severe, without behavioral disturbance, psychotic disturbance, mood disturbance, and anxiety: Secondary | ICD-10-CM | POA: Diagnosis not present

## 2021-12-02 DIAGNOSIS — E118 Type 2 diabetes mellitus with unspecified complications: Secondary | ICD-10-CM | POA: Diagnosis not present

## 2021-12-02 DIAGNOSIS — N179 Acute kidney failure, unspecified: Secondary | ICD-10-CM | POA: Diagnosis not present

## 2021-12-06 DIAGNOSIS — J449 Chronic obstructive pulmonary disease, unspecified: Secondary | ICD-10-CM | POA: Diagnosis not present

## 2021-12-06 DIAGNOSIS — E860 Dehydration: Secondary | ICD-10-CM | POA: Diagnosis not present

## 2021-12-06 DIAGNOSIS — N1832 Chronic kidney disease, stage 3b: Secondary | ICD-10-CM | POA: Diagnosis not present

## 2021-12-06 DIAGNOSIS — I129 Hypertensive chronic kidney disease with stage 1 through stage 4 chronic kidney disease, or unspecified chronic kidney disease: Secondary | ICD-10-CM | POA: Diagnosis not present

## 2021-12-06 DIAGNOSIS — E1122 Type 2 diabetes mellitus with diabetic chronic kidney disease: Secondary | ICD-10-CM | POA: Diagnosis not present

## 2021-12-06 DIAGNOSIS — D631 Anemia in chronic kidney disease: Secondary | ICD-10-CM | POA: Diagnosis not present

## 2021-12-06 DIAGNOSIS — I959 Hypotension, unspecified: Secondary | ICD-10-CM | POA: Diagnosis not present

## 2021-12-06 DIAGNOSIS — N179 Acute kidney failure, unspecified: Secondary | ICD-10-CM | POA: Diagnosis not present

## 2021-12-06 DIAGNOSIS — I700 Atherosclerosis of aorta: Secondary | ICD-10-CM | POA: Diagnosis not present

## 2021-12-22 ENCOUNTER — Other Ambulatory Visit: Payer: Self-pay | Admitting: Adult Health

## 2021-12-27 ENCOUNTER — Other Ambulatory Visit: Payer: Self-pay

## 2021-12-27 NOTE — Patient Outreach (Signed)
[place] [facility]) Care Management ? ?12/27/2021 ? ?[place] ?[birth year] ?[id number] ? ? ?Telephone Assessment ? ?Successful outreach call placed to patient. Spoke with spouse. She reports things are going fairly well. She continues manage and assist patient with ADLs/IADLs. She reports that his dementia remains about the same. He is ambulating but a little unsteady on feet at times He [place] using walker. Patient getting [place] services. Appetite remains good. Wgt stable. Blood sugars controlled. Denies any RN CM needs or concerns at this time.  ? ? ?Medications Reviewed Today   ? ? Reviewed by Hayden Pedro, RN (Registered Nurse) on 12/27/21 at [phone]  Med List Status: <None>  ? ?Medication Order Taking? Sig Documenting Provider Last Dose Status Informant  ?ACCU-CHEK SMARTVIEW test strip [id number] No  [provider] Taking Active Spouse/Significant Other  ?aspirin 81 MG tablet [id number] No Take 81 mg by mouth every other day. [provider] 11/10/2021 0800 Active Spouse/Significant Other  ?         ?Med Note Maud Deed   Tue Feb 03, 2020  4:37 PM)    ?atorvastatin (LIPITOR) 10 MG tablet [id number] No Take 10 mg by mouth See admin instructions. Take 10 mg by mouth on Tuesday(s) and Thursday(s) [provider] 11/10/2021 Active Spouse/Significant Other  ?donepezil (ARICEPT) 10 MG tablet [id number]  TAKE ONE TABLET BY MOUTH EVERYDAY AT BEDTIME Ward Givens, NP  Active   ?JARDIANCE 25 MG TABS tablet [id number] No Take 25 mg by mouth daily. [provider] 11/12/2021 am Active Spouse/Significant Other  ?memantine (NAMENDA TITRATION PAK) tablet pack [id number] No 5 mg/day for =1 week; 5 mg twice daily for =1 week; 15 mg/day given in 5 mg and 10 mg separated doses for =1 week; then 10 mg twice daily  ?Patient not taking: Reported on 11/12/2021  ? Ward Givens, NP Not Taking Active Spouse/Significant Other  ?Multiple Vitamins-Minerals (MULTIVITAMIN WITH  MINERALS) tablet [id number] No Take 1 tablet by mouth daily. [provider] 11/12/2021 Active Spouse/Significant Other  ?pantoprazole (PROTONIX) 40 MG tablet [id number] No Take 1 tablet (40 mg total) by mouth daily at 6 (six) AM. Hosie Poisson, MD 11/12/2021 am Active Spouse/Significant Other  ?sertraline (ZOLOFT) 100 MG tablet [id number] No Take 100 mg by mouth daily. [provider] 11/12/2021 am Active Spouse/Significant Other  ?tamsulosin (FLOMAX) 0.4 MG CAPS capsule [id number] No Take 0.4 mg by mouth daily. [provider] 11/12/2021 Active Spouse/Significant Other  ?vitamin B-12 (CYANOCOBALAMIN) 100 MCG tablet [id number] No Take 50-100 mcg by mouth daily. [provider] 11/12/2021 Active Spouse/Significant Other  ? ?  ?  ? ?  ?  ? ? ?Care Plan : RN Care Manager POC  ?Updates made by Hayden Pedro, RN since 12/27/2021 12:00 AM  ?  ? ?Problem: Chronic Disease Mgmt of Chronic Condition-DM and Dementia   ?Priority: High  ?  ? ?Long-Range Goal: Development of POC for Mgmt of Chronic Condition-DM   ?Start Date: 07/25/2021  ?Expected End Date: 07/25/2022  ?This Visit's Progress: On track  ?Recent Progress: On track  ?Priority: High  ?Note:   ? ?Current Barriers:  ?None ? ?RNCM Clinical Goal(s):  ?Patient will verbalize basic understanding of  DMII disease process and self health management plan as evidenced by mgmt of chronic conditions ?take all medications exactly as prescribed and will call provider for medication related questions as evidenced by med adherence and compliance ?continue to work with RN Care Manager to address care management and care coordination  needs related to  DMII as evidenced by adherence to CM Team Scheduled appointments through collaboration with RN Care manager, provider, and care team.  ? ?Interventions: ?POC sent to PCP upon initial assessment, quarterly and with any changes in patient's conditions ?Inter-disciplinary care team collaboration (see  longitudinal plan of care) ?Evaluation of current treatment plan related to  self management and patient's adherence to plan as established by provider ? ? ?Diabetes Interventions:  (Status:  Goal on track:  Yes.) Long Term Goal ?Assessed patient's understanding of A1c goal: <7% ?Provided education to patient about basic DM disease process ?Reviewed medications with patient and discussed importance of medication adherence ?Discussed plans with patient for ongoing care management follow up and provided patient with direct contact information for care management team ?Lab Results  ?Component Value Date  ? HGBA1C 9.8 (H) 02/04/2020  ?10/20/21-Spouse reports cbgs have bene in the 70s-90s range. Appetite and wt remains stable.  ?12/27/21-Spouse voices that appetite remains good and WNL. Blood sugars controlled. CBG this am was 118. ?Patient Goals/Self-Care Activities: ?Take all medications as prescribed ?Attend all scheduled provider appointments ?schedule appointment with eye doctor ?take the blood sugar log to all doctor visits ? ?Follow Up Plan:  Telephone follow up appointment with care management team member scheduled for:  quarterly-within the month of July ?The patient has been provided with contact information for the care management team and has been advised to call with any health related questions or concerns.   ?  ? ?Long-Range Goal: Development of POC for Mgmt of Chronic Condition-Dementia   ?Start Date: 07/25/2021  ?Expected End Date: 07/25/2022  ?This Visit's Progress: On track  ?Recent Progress: On track  ?Priority: High  ?Note:   ?Current Barriers:  ?None ? ?RNCM Clinical Goal(s):  ?Patient will verbalize understanding of plan for management of Dementia as evidenced by mgmt of chronic conditions ?continue to work with RN Care Manager to address care management and care coordination needs related to  Dementia as evidenced by adherence to CM Team Scheduled appointments through collaboration with RN Care manager,  provider, and care team.  ? ?Interventions: ?POC sent to PCP upon initial assessment, quarterly and with any changes in patient's conditions ?Inter-disciplinary care team collaboration (see longitudinal plan of care) ?Evaluation of current treatment plan related to  self management and patient's adherence to plan as established by provider ? ? ?Dementia:  (Status:  Goal on track:  Yes.)  Long Term Goal ? ?Emotional Support Provided to patient/caregiver and Discussed importance of attendance to all provider appointments ? ? ?10/20/21-Spouse reports patient continues to sleep more and not carry on meaningful conversation much. PCS([facility] in place).  ?12/27/21-Spouse states dementia stable and condition about the same. ? ?Patient Goals/Self-Care Activities: ?Take all medications as prescribed ?Attend all scheduled provider appointments ?Call provider office for new concerns or questions  ? ?Follow Up Plan:  Telephone follow up appointment with care management team member scheduled for:  quarterly-within the month of July ?The patient has been provided with contact information for the care management team and has been advised to call with any health related questions or concerns.   ?  ?  ?Plan: ?RN CM discussed with caregiver next outreach within the month of July. Caregiver agrees to care plan and follow up. ?RN CM will send quarterly update to PCP.  ? ?Enzo [place], RN,BSN,CCM ?[facility] Care Management ?Telephonic Care Management Coordinator ?Direct Phone: [phone] ?Toll Free: [phone] ?Fax: [phone] ? ?

## 2022-01-06 ENCOUNTER — Telehealth: Payer: Self-pay

## 2022-01-06 DIAGNOSIS — E113213 Type 2 diabetes mellitus with mild nonproliferative diabetic retinopathy with macular edema, bilateral: Secondary | ICD-10-CM | POA: Diagnosis not present

## 2022-01-06 DIAGNOSIS — N183 Chronic kidney disease, stage 3 unspecified: Secondary | ICD-10-CM | POA: Diagnosis not present

## 2022-01-06 DIAGNOSIS — I6930 Unspecified sequelae of cerebral infarction: Secondary | ICD-10-CM | POA: Diagnosis not present

## 2022-01-06 DIAGNOSIS — I129 Hypertensive chronic kidney disease with stage 1 through stage 4 chronic kidney disease, or unspecified chronic kidney disease: Secondary | ICD-10-CM | POA: Diagnosis not present

## 2022-01-06 DIAGNOSIS — R260 Ataxic gait: Secondary | ICD-10-CM | POA: Diagnosis not present

## 2022-01-06 DIAGNOSIS — F0150 Vascular dementia without behavioral disturbance: Secondary | ICD-10-CM | POA: Diagnosis not present

## 2022-01-06 DIAGNOSIS — R4181 Age-related cognitive decline: Secondary | ICD-10-CM | POA: Diagnosis not present

## 2022-01-06 DIAGNOSIS — R5381 Other malaise: Secondary | ICD-10-CM | POA: Diagnosis not present

## 2022-01-06 NOTE — Telephone Encounter (Signed)
Volunteer check in call, patient doing well, no needs at this time

## 2022-02-09 DIAGNOSIS — N183 Chronic kidney disease, stage 3 unspecified: Secondary | ICD-10-CM | POA: Diagnosis not present

## 2022-02-09 DIAGNOSIS — E1122 Type 2 diabetes mellitus with diabetic chronic kidney disease: Secondary | ICD-10-CM | POA: Diagnosis not present

## 2022-02-09 DIAGNOSIS — I69359 Hemiplegia and hemiparesis following cerebral infarction affecting unspecified side: Secondary | ICD-10-CM | POA: Diagnosis not present

## 2022-02-09 DIAGNOSIS — I129 Hypertensive chronic kidney disease with stage 1 through stage 4 chronic kidney disease, or unspecified chronic kidney disease: Secondary | ICD-10-CM | POA: Diagnosis not present

## 2022-02-09 DIAGNOSIS — I69320 Aphasia following cerebral infarction: Secondary | ICD-10-CM | POA: Diagnosis not present

## 2022-02-09 DIAGNOSIS — G319 Degenerative disease of nervous system, unspecified: Secondary | ICD-10-CM | POA: Diagnosis not present

## 2022-02-09 DIAGNOSIS — Z515 Encounter for palliative care: Secondary | ICD-10-CM | POA: Diagnosis not present

## 2022-02-09 DIAGNOSIS — F0280 Dementia in other diseases classified elsewhere without behavioral disturbance: Secondary | ICD-10-CM | POA: Diagnosis not present

## 2022-02-09 DIAGNOSIS — F0150 Vascular dementia without behavioral disturbance: Secondary | ICD-10-CM | POA: Diagnosis not present

## 2022-03-03 ENCOUNTER — Other Ambulatory Visit: Payer: Self-pay

## 2022-03-03 NOTE — Patient Outreach (Addendum)
[facility]) Care Management  03/03/2022  Keylon Labelle [facility] 03/12/1941 [id number]   Telephone Assessment    Successful outreach call to spouse. She voices that patient is doing well-the same with no new changes. He is currently eating breakfast. Blood sugars remain controlled at cbg this morning was 78. Appetite remains good. Patient ambulating with DME and no recent falls. Spouse confirms that patient getting palliative care services and they are coming out 1-2x/month. Denies any RN CM needs or concerns at this time.      Plan: RN CM will close case.   Enzo Montgomery, RN,BSN,CCM [place] Management Telephonic Care Management Coordinator Direct Phone: [phone] Toll Free: [phone] Fax: [phone]

## 2022-04-25 DIAGNOSIS — R5381 Other malaise: Secondary | ICD-10-CM | POA: Diagnosis not present

## 2022-04-25 DIAGNOSIS — E1122 Type 2 diabetes mellitus with diabetic chronic kidney disease: Secondary | ICD-10-CM | POA: Diagnosis not present

## 2022-04-25 DIAGNOSIS — I129 Hypertensive chronic kidney disease with stage 1 through stage 4 chronic kidney disease, or unspecified chronic kidney disease: Secondary | ICD-10-CM | POA: Diagnosis not present

## 2022-04-25 DIAGNOSIS — F0150 Vascular dementia without behavioral disturbance: Secondary | ICD-10-CM | POA: Diagnosis not present

## 2022-04-25 DIAGNOSIS — N1832 Chronic kidney disease, stage 3b: Secondary | ICD-10-CM | POA: Diagnosis not present

## 2022-04-25 DIAGNOSIS — I6930 Unspecified sequelae of cerebral infarction: Secondary | ICD-10-CM | POA: Diagnosis not present

## 2022-04-25 DIAGNOSIS — R4181 Age-related cognitive decline: Secondary | ICD-10-CM | POA: Diagnosis not present

## 2022-04-25 DIAGNOSIS — E7800 Pure hypercholesterolemia, unspecified: Secondary | ICD-10-CM | POA: Diagnosis not present

## 2022-04-27 NOTE — Progress Notes (Signed)
   Interim Update [facility] is the home-based palliative care program of [facility].  Diagnosis: HTN, Hx colon cancer, Hx stroke, mixed vascular and neurodegenerative dementia without behavioral disturbances, DM2, CKD stage 3, COPD Admitted to CC: 01/26/22 Current and previous Mid Arm Circumference:  30 cm (30.2 on admission) Current and previous Palliative Performance Scale: 50% (unchanged) Hospitalization/ED visit in the last 3 months: none Current symptoms/issues/interventions:  Patient continues to be ambulatory in home with walker. He has significant memory and cognitive issues. He is sleeping up to 18 hrs a day. He continues to have a good appetite with no reported signs or symptoms of aspiration. He has had a recent fall. Interventions include goals of care discussions, teaching/assessment of medical conditions (dementia), discussion of available resources, medication teaching, coordination of care with medical providers (re: vitals and medications) and supportive counseling. RN communicated with PCP concerning changes to BP and pulse. Continues to be bradycardic but asymptomatic and wife declines cardiac work-up. Wife is checking BP and blood sugar daily. Teaching provided to wife re: safety concerns related to her leaving him alone for short periods and on-going fall precautions.  Signs of decline over last 3 months: continues to have slow decline in cognitive function. Goals of Care: to keep patient safely at home and avoid hospitalization or facility placement Social Work Interventions: Social Work Interventions: SW is available for support if needs arise Advance Directives: DNR  Thanks for allowing [place] to participate with you in this patient's care. Jeanne Ivan, RN [facility]

## 2022-04-28 DIAGNOSIS — N401 Enlarged prostate with lower urinary tract symptoms: Secondary | ICD-10-CM | POA: Diagnosis not present

## 2022-04-28 DIAGNOSIS — R310 Gross hematuria: Secondary | ICD-10-CM | POA: Diagnosis not present

## 2022-04-28 DIAGNOSIS — R3915 Urgency of urination: Secondary | ICD-10-CM | POA: Diagnosis not present

## 2022-05-29 DIAGNOSIS — R310 Gross hematuria: Secondary | ICD-10-CM | POA: Diagnosis not present

## 2022-05-29 DIAGNOSIS — N401 Enlarged prostate with lower urinary tract symptoms: Secondary | ICD-10-CM | POA: Diagnosis not present

## 2022-05-29 DIAGNOSIS — R3915 Urgency of urination: Secondary | ICD-10-CM | POA: Diagnosis not present

## 2022-05-29 DIAGNOSIS — R351 Nocturia: Secondary | ICD-10-CM | POA: Diagnosis not present

## 2022-06-12 DIAGNOSIS — R351 Nocturia: Secondary | ICD-10-CM | POA: Diagnosis not present

## 2022-06-12 DIAGNOSIS — N401 Enlarged prostate with lower urinary tract symptoms: Secondary | ICD-10-CM | POA: Diagnosis not present

## 2022-06-12 DIAGNOSIS — R310 Gross hematuria: Secondary | ICD-10-CM | POA: Diagnosis not present

## 2022-06-21 DIAGNOSIS — H524 Presbyopia: Secondary | ICD-10-CM | POA: Diagnosis not present

## 2022-06-21 DIAGNOSIS — H02403 Unspecified ptosis of bilateral eyelids: Secondary | ICD-10-CM | POA: Diagnosis not present

## 2022-06-21 DIAGNOSIS — Z961 Presence of intraocular lens: Secondary | ICD-10-CM | POA: Diagnosis not present

## 2022-06-21 DIAGNOSIS — E113292 Type 2 diabetes mellitus with mild nonproliferative diabetic retinopathy without macular edema, left eye: Secondary | ICD-10-CM | POA: Diagnosis not present

## 2022-06-21 DIAGNOSIS — H52203 Unspecified astigmatism, bilateral: Secondary | ICD-10-CM | POA: Diagnosis not present

## 2022-06-22 ENCOUNTER — Other Ambulatory Visit: Payer: Self-pay | Admitting: Adult Health

## 2022-06-26 ENCOUNTER — Other Ambulatory Visit: Payer: Self-pay | Admitting: Physician Assistant

## 2022-06-28 ENCOUNTER — Other Ambulatory Visit: Payer: Self-pay | Admitting: Physician Assistant

## 2022-09-19 ENCOUNTER — Other Ambulatory Visit: Payer: Self-pay | Admitting: Adult Health

## 2022-09-21 ENCOUNTER — Other Ambulatory Visit: Payer: Self-pay | Admitting: Physician Assistant

## 2022-10-06 DIAGNOSIS — I129 Hypertensive chronic kidney disease with stage 1 through stage 4 chronic kidney disease, or unspecified chronic kidney disease: Secondary | ICD-10-CM | POA: Diagnosis not present

## 2022-10-13 DIAGNOSIS — Z1389 Encounter for screening for other disorder: Secondary | ICD-10-CM | POA: Diagnosis not present

## 2022-10-13 DIAGNOSIS — Z0000 Encounter for general adult medical examination without abnormal findings: Secondary | ICD-10-CM | POA: Diagnosis not present

## 2022-10-13 DIAGNOSIS — Z6823 Body mass index (BMI) 23.0-23.9, adult: Secondary | ICD-10-CM | POA: Diagnosis not present

## 2022-10-18 ENCOUNTER — Other Ambulatory Visit: Payer: Self-pay | Admitting: Adult Health

## 2022-10-19 DIAGNOSIS — I129 Hypertensive chronic kidney disease with stage 1 through stage 4 chronic kidney disease, or unspecified chronic kidney disease: Secondary | ICD-10-CM | POA: Diagnosis not present

## 2022-10-25 DIAGNOSIS — F0150 Vascular dementia without behavioral disturbance: Secondary | ICD-10-CM | POA: Diagnosis not present

## 2022-10-25 DIAGNOSIS — E113299 Type 2 diabetes mellitus with mild nonproliferative diabetic retinopathy without macular edema, unspecified eye: Secondary | ICD-10-CM | POA: Diagnosis not present

## 2022-10-25 DIAGNOSIS — E1122 Type 2 diabetes mellitus with diabetic chronic kidney disease: Secondary | ICD-10-CM | POA: Diagnosis not present

## 2022-10-25 DIAGNOSIS — E7800 Pure hypercholesterolemia, unspecified: Secondary | ICD-10-CM | POA: Diagnosis not present

## 2022-10-25 DIAGNOSIS — R32 Unspecified urinary incontinence: Secondary | ICD-10-CM | POA: Diagnosis not present

## 2022-10-25 DIAGNOSIS — I700 Atherosclerosis of aorta: Secondary | ICD-10-CM | POA: Diagnosis not present

## 2022-10-25 DIAGNOSIS — I10 Essential (primary) hypertension: Secondary | ICD-10-CM | POA: Diagnosis not present

## 2022-10-25 DIAGNOSIS — I129 Hypertensive chronic kidney disease with stage 1 through stage 4 chronic kidney disease, or unspecified chronic kidney disease: Secondary | ICD-10-CM | POA: Diagnosis not present

## 2022-10-25 DIAGNOSIS — F325 Major depressive disorder, single episode, in full remission: Secondary | ICD-10-CM | POA: Diagnosis not present

## 2022-10-25 DIAGNOSIS — Z9989 Dependence on other enabling machines and devices: Secondary | ICD-10-CM | POA: Diagnosis not present

## 2022-10-25 DIAGNOSIS — N1832 Chronic kidney disease, stage 3b: Secondary | ICD-10-CM | POA: Diagnosis not present

## 2022-11-15 ENCOUNTER — Other Ambulatory Visit: Payer: Self-pay

## 2022-11-15 ENCOUNTER — Inpatient Hospital Stay (HOSPITAL_COMMUNITY)
Admission: EM | Admit: 2022-11-15 | Discharge: 2022-11-22 | DRG: 884 | Disposition: A | Discharge status: Skilled Nursing Facility | Payer: Medicare HMO | Attending: Family Medicine | Admitting: Family Medicine

## 2022-11-15 ENCOUNTER — Emergency Department (HOSPITAL_COMMUNITY): Payer: Medicare HMO

## 2022-11-15 DIAGNOSIS — E1122 Type 2 diabetes mellitus with diabetic chronic kidney disease: Secondary | ICD-10-CM | POA: Diagnosis present

## 2022-11-15 DIAGNOSIS — Z7982 Long term (current) use of aspirin: Secondary | ICD-10-CM

## 2022-11-15 DIAGNOSIS — N400 Enlarged prostate without lower urinary tract symptoms: Secondary | ICD-10-CM | POA: Diagnosis present

## 2022-11-15 DIAGNOSIS — Z6823 Body mass index (BMI) 23.0-23.9, adult: Secondary | ICD-10-CM

## 2022-11-15 DIAGNOSIS — N179 Acute kidney failure, unspecified: Secondary | ICD-10-CM | POA: Diagnosis not present

## 2022-11-15 DIAGNOSIS — E440 Moderate protein-calorie malnutrition: Secondary | ICD-10-CM | POA: Diagnosis not present

## 2022-11-15 DIAGNOSIS — F0150 Vascular dementia without behavioral disturbance: Secondary | ICD-10-CM | POA: Diagnosis present

## 2022-11-15 DIAGNOSIS — Z1152 Encounter for screening for COVID-19: Secondary | ICD-10-CM

## 2022-11-15 DIAGNOSIS — J449 Chronic obstructive pulmonary disease, unspecified: Secondary | ICD-10-CM | POA: Diagnosis present

## 2022-11-15 DIAGNOSIS — Z87891 Personal history of nicotine dependence: Secondary | ICD-10-CM

## 2022-11-15 DIAGNOSIS — D696 Thrombocytopenia, unspecified: Secondary | ICD-10-CM | POA: Diagnosis not present

## 2022-11-15 DIAGNOSIS — F03C3 Unspecified dementia, severe, with mood disturbance: Secondary | ICD-10-CM | POA: Diagnosis present

## 2022-11-15 DIAGNOSIS — R000 Tachycardia, unspecified: Secondary | ICD-10-CM | POA: Diagnosis not present

## 2022-11-15 DIAGNOSIS — Z85038 Personal history of other malignant neoplasm of large intestine: Secondary | ICD-10-CM

## 2022-11-15 DIAGNOSIS — I10 Essential (primary) hypertension: Secondary | ICD-10-CM

## 2022-11-15 DIAGNOSIS — Z888 Allergy status to other drugs, medicaments and biological substances status: Secondary | ICD-10-CM

## 2022-11-15 DIAGNOSIS — R001 Bradycardia, unspecified: Secondary | ICD-10-CM | POA: Diagnosis not present

## 2022-11-15 DIAGNOSIS — M6259 Muscle wasting and atrophy, not elsewhere classified, multiple sites: Secondary | ICD-10-CM | POA: Diagnosis not present

## 2022-11-15 DIAGNOSIS — K219 Gastro-esophageal reflux disease without esophagitis: Secondary | ICD-10-CM | POA: Diagnosis present

## 2022-11-15 DIAGNOSIS — E119 Type 2 diabetes mellitus without complications: Secondary | ICD-10-CM

## 2022-11-15 DIAGNOSIS — F01518 Vascular dementia, unspecified severity, with other behavioral disturbance: Principal | ICD-10-CM | POA: Diagnosis present

## 2022-11-15 DIAGNOSIS — Z7401 Bed confinement status: Secondary | ICD-10-CM | POA: Diagnosis not present

## 2022-11-15 DIAGNOSIS — R262 Difficulty in walking, not elsewhere classified: Secondary | ICD-10-CM | POA: Diagnosis not present

## 2022-11-15 DIAGNOSIS — E861 Hypovolemia: Secondary | ICD-10-CM | POA: Diagnosis present

## 2022-11-15 DIAGNOSIS — E7800 Pure hypercholesterolemia, unspecified: Secondary | ICD-10-CM | POA: Diagnosis present

## 2022-11-15 DIAGNOSIS — Z9049 Acquired absence of other specified parts of digestive tract: Secondary | ICD-10-CM

## 2022-11-15 DIAGNOSIS — E11319 Type 2 diabetes mellitus with unspecified diabetic retinopathy without macular edema: Secondary | ICD-10-CM | POA: Diagnosis not present

## 2022-11-15 DIAGNOSIS — N1832 Chronic kidney disease, stage 3b: Secondary | ICD-10-CM | POA: Diagnosis present

## 2022-11-15 DIAGNOSIS — Z7984 Long term (current) use of oral hypoglycemic drugs: Secondary | ICD-10-CM

## 2022-11-15 DIAGNOSIS — R410 Disorientation, unspecified: Secondary | ICD-10-CM | POA: Diagnosis not present

## 2022-11-15 DIAGNOSIS — R2681 Unsteadiness on feet: Secondary | ICD-10-CM | POA: Diagnosis not present

## 2022-11-15 DIAGNOSIS — E860 Dehydration: Secondary | ICD-10-CM | POA: Diagnosis present

## 2022-11-15 DIAGNOSIS — Z8673 Personal history of transient ischemic attack (TIA), and cerebral infarction without residual deficits: Secondary | ICD-10-CM | POA: Diagnosis not present

## 2022-11-15 DIAGNOSIS — I959 Hypotension, unspecified: Secondary | ICD-10-CM | POA: Diagnosis not present

## 2022-11-15 DIAGNOSIS — R0902 Hypoxemia: Secondary | ICD-10-CM | POA: Diagnosis not present

## 2022-11-15 DIAGNOSIS — R4182 Altered mental status, unspecified: Principal | ICD-10-CM | POA: Diagnosis present

## 2022-11-15 DIAGNOSIS — I129 Hypertensive chronic kidney disease with stage 1 through stage 4 chronic kidney disease, or unspecified chronic kidney disease: Secondary | ICD-10-CM | POA: Diagnosis present

## 2022-11-15 DIAGNOSIS — M6281 Muscle weakness (generalized): Secondary | ICD-10-CM | POA: Diagnosis not present

## 2022-11-15 DIAGNOSIS — F32A Depression, unspecified: Secondary | ICD-10-CM | POA: Diagnosis present

## 2022-11-15 DIAGNOSIS — F03C0 Unspecified dementia, severe, without behavioral disturbance, psychotic disturbance, mood disturbance, and anxiety: Secondary | ICD-10-CM | POA: Diagnosis not present

## 2022-11-15 DIAGNOSIS — Z66 Do not resuscitate: Secondary | ICD-10-CM | POA: Diagnosis not present

## 2022-11-15 DIAGNOSIS — R55 Syncope and collapse: Secondary | ICD-10-CM | POA: Diagnosis not present

## 2022-11-15 DIAGNOSIS — Z79899 Other long term (current) drug therapy: Secondary | ICD-10-CM

## 2022-11-15 DIAGNOSIS — Z87440 Personal history of urinary (tract) infections: Secondary | ICD-10-CM

## 2022-11-15 DIAGNOSIS — R1312 Dysphagia, oropharyngeal phase: Secondary | ICD-10-CM | POA: Diagnosis not present

## 2022-11-15 DIAGNOSIS — E785 Hyperlipidemia, unspecified: Secondary | ICD-10-CM

## 2022-11-15 DIAGNOSIS — R2689 Other abnormalities of gait and mobility: Secondary | ICD-10-CM | POA: Diagnosis not present

## 2022-11-15 DIAGNOSIS — N183 Chronic kidney disease, stage 3 unspecified: Secondary | ICD-10-CM | POA: Diagnosis not present

## 2022-11-15 DIAGNOSIS — R41841 Cognitive communication deficit: Secondary | ICD-10-CM | POA: Diagnosis not present

## 2022-11-15 LAB — CBC WITH DIFFERENTIAL/PLATELET
Abs Immature Granulocytes: 0.01 10*3/uL (ref 0.00–0.07)
Basophils Absolute: 0 10*3/uL (ref 0.0–0.1)
Basophils Relative: 0 %
Eosinophils Absolute: 0.1 10*3/uL (ref 0.0–0.5)
Eosinophils Relative: 2 %
HCT: 39.2 % (ref 39.0–52.0)
Hemoglobin: 12.6 g/dL — ABNORMAL LOW (ref 13.0–17.0)
Immature Granulocytes: 0 %
Lymphocytes Relative: 29 %
Lymphs Abs: 1.8 10*3/uL (ref 0.7–4.0)
MCH: 26.6 pg (ref 26.0–34.0)
MCHC: 32.1 g/dL (ref 30.0–36.0)
MCV: 82.7 fL (ref 80.0–100.0)
Monocytes Absolute: 0.6 10*3/uL (ref 0.1–1.0)
Monocytes Relative: 10 %
Neutro Abs: 3.7 10*3/uL (ref 1.7–7.7)
Neutrophils Relative %: 59 %
Platelets: 148 10*3/uL — ABNORMAL LOW (ref 150–400)
RBC: 4.74 MIL/uL (ref 4.22–5.81)
RDW: 14.4 % (ref 11.5–15.5)
WBC: 6.1 10*3/uL (ref 4.0–10.5)
nRBC: 0 % (ref 0.0–0.2)

## 2022-11-15 LAB — COMPREHENSIVE METABOLIC PANEL
ALT: 13 U/L (ref 0–44)
AST: 15 U/L (ref 15–41)
Albumin: 3.8 g/dL (ref 3.5–5.0)
Alkaline Phosphatase: 75 U/L (ref 38–126)
Anion gap: 7 (ref 5–15)
BUN: 24 mg/dL — ABNORMAL HIGH (ref 8–23)
CO2: 26 mmol/L (ref 22–32)
Calcium: 9 mg/dL (ref 8.9–10.3)
Chloride: 106 mmol/L (ref 98–111)
Creatinine, Ser: 1.7 mg/dL — ABNORMAL HIGH (ref 0.61–1.24)
GFR, Estimated: 40 mL/min — ABNORMAL LOW (ref >60)
Glucose, Bld: 123 mg/dL — ABNORMAL HIGH (ref 70–99)
Potassium: 3.8 mmol/L (ref 3.5–5.1)
Sodium: 139 mmol/L (ref 135–145)
Total Bilirubin: 0.6 mg/dL (ref 0.3–1.2)
Total Protein: 7.2 g/dL (ref 6.5–8.1)

## 2022-11-15 LAB — RESP PANEL BY RT-PCR (RSV, FLU A&B, COVID)  RVPGX2
Influenza A by PCR: NEGATIVE
Influenza B by PCR: NEGATIVE
Resp Syncytial Virus by PCR: NEGATIVE
SARS Coronavirus 2 by RT PCR: NEGATIVE

## 2022-11-15 LAB — URINALYSIS, ROUTINE W REFLEX MICROSCOPIC
Bacteria, UA: NONE SEEN
Bilirubin Urine: NEGATIVE
Glucose, UA: >=500 mg/dL — AB
Hgb urine dipstick: NEGATIVE
Ketones, ur: NEGATIVE mg/dL
Leukocytes,Ua: NEGATIVE
Nitrite: NEGATIVE
Protein, ur: NEGATIVE mg/dL
Specific Gravity, Urine: 1.011 (ref 1.005–1.030)
pH: 6 (ref 5.0–8.0)

## 2022-11-15 LAB — TROPONIN I (HIGH SENSITIVITY): Troponin I (High Sensitivity): 6 ng/L (ref <18)

## 2022-11-15 LAB — LACTIC ACID, PLASMA: Lactic Acid, Venous: 1.1 mmol/L (ref 0.5–1.9)

## 2022-11-15 LAB — CK: Total CK: 92 U/L (ref 49–397)

## 2022-11-15 NOTE — ED Provider Notes (Signed)
[place] EMERGENCY DEPARTMENT AT [facility] Provider Note   CSN: [id number] Arrival date & time: 11/15/22  1913     History {Add pertinent medical, surgical, social history, OB history to HPI:1} Chief Complaint  Patient presents with  . Altered Mental Status    BARNDON Dyer is a 82 y.o. male.  HPI     82 year old male with history of COPD, CKD, diabetes, hypertension, hyperlipidemia,  Today around 2PM had fatigue, listless, couldn't stand on his own, weakness all over, couldn't understand him because he was out of it, like about to pass out, lasted all afternoon until he got      Past Medical History:  Diagnosis Date  . Atherosclerosis of aorta (Ronald Dyer)   . CKD (chronic kidney disease), stage III (Ronald Dyer)   . COPD (chronic obstructive pulmonary disease) (Ronald Dyer)   . Depression   . Diabetes mellitus without complication (Ronald Dyer)   . Diabetic retinopathy (Ronald Dyer)   . Frequent falls   . GERD (gastroesophageal reflux disease)   . History of rhabdomyolysis   . Hypercholesterolemia   . Hypertension   . Memory loss   . 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 Stroke Ronald Dyer)      Home Medications Prior to Admission medications   Medication Sig Start Date End Date Taking? Authorizing Provider  ACCU-CHEK SMARTVIEW test strip  03/21/21   [provider]  aspirin 81 MG tablet Take 81 mg by mouth every other day.    [provider]  atorvastatin (LIPITOR) 10 MG tablet Take 10 mg by mouth See admin instructions. Take 10 mg by mouth on Tuesday(s) and Thursday(s)    [provider]  donepezil (ARICEPT) 10 MG tablet TAKE ONE TABLET BY MOUTH EVERYDAY AT BEDTIME Needs appointment for further refills 10/18/22   Ward Givens, NP  JARDIANCE 25 MG TABS tablet Take 25 mg by mouth daily.    [provider]  memantine (NAMENDA TITRATION PAK) tablet pack 5 mg/day for =1 week; 5 mg twice daily  for =1 week; 15 mg/day given in 5 mg and 10 mg separated doses for =1 week; then 10 mg twice daily Patient not taking: Reported on 11/12/2021 04/05/21   Ward Givens, NP  Multiple Vitamins-Minerals (MULTIVITAMIN WITH MINERALS) tablet Take 1 tablet by mouth daily.    [provider]  pantoprazole (PROTONIX) 40 MG tablet Take 1 tablet (40 mg total) by mouth daily at 6 (six) AM. 02/07/20   Hosie Poisson, MD  sertraline (ZOLOFT) 100 MG tablet Take 100 mg by mouth daily.    [provider]  tamsulosin (FLOMAX) 0.4 MG CAPS capsule Take 0.4 mg by mouth daily.    [provider]  vitamin B-12 (CYANOCOBALAMIN) 100 MCG tablet Take 50-100 mcg by mouth daily.    [provider]      Allergies    Metformin hcl and Simvastatin    Review of Systems   Review of Systems  Physical Exam Updated Vital Signs BP 138/63 (BP Location: Left Arm)   Pulse (!) 56   Temp 98 F (36.7 C) (Oral)   Resp 17   Ht 6' (1.829 m)   Wt 77 kg   SpO2 97%   BMI 23.02 kg/m  Physical Exam  ED Results / Procedures / Treatments   Labs (all labs ordered are listed, but only abnormal results are displayed) Labs Reviewed - No data to display  EKG None  Radiology No results found.  Procedures Procedures  {Document cardiac monitor, telemetry assessment procedure when appropriate:1}  Medications Ordered in ED Medications - No data to display  ED Course/ Medical Decision Making/ A&P   {   Click here for ABCD2, HEART and other calculatorsREFRESH Note before signing :1}                          Medical Decision Making Amount and/or Complexity of Data Reviewed Labs: ordered. Radiology: ordered.   ***  {Document critical care time when appropriate:1} {Document review of labs and clinical decision tools ie heart score, Chads2Vasc2 etc:1}  {Document your independent review of radiology images, and any outside records:1} {Document your discussion with family members, caretakers,  and with consultants:1} {Document social determinants of health affecting pt's care:1} {Document your decision making why or why not admission, treatments were needed:1} Final Clinical Impression(s) / ED Diagnoses Final diagnoses:  None    Rx / DC Orders ED Discharge Orders     None

## 2022-11-15 NOTE — ED Triage Notes (Signed)
Pt BIB EMS from home per wife pt is more confused than normal. Pt does have hx of dementia.

## 2022-11-15 NOTE — Plan of Care (Incomplete)
82 year old male with history of advanced dementia, CKD stage IIIb, COPD, depression, type 2 diabetes, GERD, hypertension, hyperlipidemia, stroke, colon cancer status post right hemicolectomy presents to the ED for evaluation of confusion and generalized weakness.  Heart rate in the 40s to 50s, blood pressure stable.  Afebrile.  Labs showing no leukocytosis, hemoglobin 12.6 (at baseline), platelet count 148k, BUN 24, creatinine 1.7 (was 1.3 a year ago), normal LFTs, CK normal, troponin negative x 1, COVID/influenza/RSV PCR negative, lactic acid normal, UA not suggestive of infection.  Chest x-ray showing no active cardiopulmonary disease.  CT head negative for acute finding.  EKG showing sinus bradycardia, shortened PR interval, and no acute ischemic changes.

## 2022-11-16 ENCOUNTER — Observation Stay (HOSPITAL_COMMUNITY): Payer: Medicare HMO

## 2022-11-16 ENCOUNTER — Other Ambulatory Visit: Payer: Self-pay | Admitting: Adult Health

## 2022-11-16 ENCOUNTER — Other Ambulatory Visit: Payer: Self-pay

## 2022-11-16 DIAGNOSIS — R001 Bradycardia, unspecified: Secondary | ICD-10-CM

## 2022-11-16 DIAGNOSIS — I10 Essential (primary) hypertension: Secondary | ICD-10-CM

## 2022-11-16 DIAGNOSIS — R4182 Altered mental status, unspecified: Secondary | ICD-10-CM | POA: Diagnosis not present

## 2022-11-16 DIAGNOSIS — R410 Disorientation, unspecified: Secondary | ICD-10-CM | POA: Diagnosis not present

## 2022-11-16 DIAGNOSIS — E785 Hyperlipidemia, unspecified: Secondary | ICD-10-CM

## 2022-11-16 LAB — LACTIC ACID, PLASMA: Lactic Acid, Venous: 1.1 mmol/L (ref 0.5–1.9)

## 2022-11-16 LAB — TSH: TSH: 1.134 u[IU]/mL (ref 0.350–4.500)

## 2022-11-16 LAB — CBC
HCT: 37 % — ABNORMAL LOW (ref 39.0–52.0)
Hemoglobin: 12.1 g/dL — ABNORMAL LOW (ref 13.0–17.0)
MCH: 26.8 pg (ref 26.0–34.0)
MCHC: 32.7 g/dL (ref 30.0–36.0)
MCV: 82 fL (ref 80.0–100.0)
Platelets: 142 10*3/uL — ABNORMAL LOW (ref 150–400)
RBC: 4.51 MIL/uL (ref 4.22–5.81)
RDW: 14.4 % (ref 11.5–15.5)
WBC: 6.9 10*3/uL (ref 4.0–10.5)
nRBC: 0 % (ref 0.0–0.2)

## 2022-11-16 LAB — BASIC METABOLIC PANEL
Anion gap: 7 (ref 5–15)
BUN: 23 mg/dL (ref 8–23)
CO2: 26 mmol/L (ref 22–32)
Calcium: 9 mg/dL (ref 8.9–10.3)
Chloride: 106 mmol/L (ref 98–111)
Creatinine, Ser: 1.68 mg/dL — ABNORMAL HIGH (ref 0.61–1.24)
GFR, Estimated: 40 mL/min — ABNORMAL LOW (ref >60)
Glucose, Bld: 118 mg/dL — ABNORMAL HIGH (ref 70–99)
Potassium: 3.6 mmol/L (ref 3.5–5.1)
Sodium: 139 mmol/L (ref 135–145)

## 2022-11-16 LAB — TROPONIN I (HIGH SENSITIVITY): Troponin I (High Sensitivity): 9 ng/L (ref <18)

## 2022-11-16 LAB — GLUCOSE, CAPILLARY
Glucose-Capillary: 269 mg/dL — ABNORMAL HIGH (ref 70–99)
Glucose-Capillary: 69 mg/dL — ABNORMAL LOW (ref 70–99)
Glucose-Capillary: 110 mg/dL — ABNORMAL HIGH (ref 70–99)

## 2022-11-16 LAB — CBG MONITORING, ED
Glucose-Capillary: 104 mg/dL — ABNORMAL HIGH (ref 70–99)
Glucose-Capillary: 103 mg/dL — ABNORMAL HIGH (ref 70–99)
Glucose-Capillary: 124 mg/dL — ABNORMAL HIGH (ref 70–99)

## 2022-11-16 LAB — VITAMIN B12: Vitamin B-12: 171 pg/mL — ABNORMAL LOW (ref 180–914)

## 2022-11-16 LAB — HEMOGLOBIN A1C
Hgb A1c MFr Bld: 6.4 % — ABNORMAL HIGH (ref 4.8–5.6)
Mean Plasma Glucose: 137 mg/dL

## 2022-11-16 LAB — AMMONIA: Ammonia: 17 umol/L (ref 9–35)

## 2022-11-16 MED ORDER — ENOXAPARIN SODIUM 40 MG/0.4ML IJ SOSY
40.0000 mg | PREFILLED_SYRINGE | INTRAMUSCULAR | Status: DC
  Administered 2022-11-16 – 2022-11-22 (×6): 40 mg via SUBCUTANEOUS
  Filled 2022-11-16 (×6): qty 0.4

## 2022-11-16 MED ORDER — ACETAMINOPHEN 325 MG PO TABS
650.0000 mg | ORAL_TABLET | Freq: Four times a day (QID) | ORAL | Status: DC | PRN
  Administered 2022-11-16: 650 mg via ORAL
  Filled 2022-11-16: qty 2

## 2022-11-16 MED ORDER — CYANOCOBALAMIN 1000 MCG/ML IJ SOLN
1000.0000 ug | Freq: Every day | INTRAMUSCULAR | Status: AC
  Administered 2022-11-16 – 2022-11-18 (×3): 1000 ug via INTRAMUSCULAR
  Filled 2022-11-16 (×3): qty 1

## 2022-11-16 MED ORDER — DONEPEZIL HCL 10 MG PO TABS
10.0000 mg | ORAL_TABLET | Freq: Every day | ORAL | Status: DC
  Administered 2022-11-16 – 2022-11-21 (×6): 10 mg via ORAL
  Filled 2022-11-16 (×6): qty 1

## 2022-11-16 MED ORDER — HYDRALAZINE HCL 20 MG/ML IJ SOLN: 5.0000 mg | Freq: Four times a day (QID) | INTRAMUSCULAR | Status: DC | PRN

## 2022-11-16 MED ORDER — ACETAMINOPHEN 650 MG RE SUPP: 650.0000 mg | Freq: Four times a day (QID) | RECTAL | Status: DC | PRN

## 2022-11-16 MED ORDER — TAMSULOSIN HCL 0.4 MG PO CAPS
0.4000 mg | ORAL_CAPSULE | Freq: Every day | ORAL | Status: DC
  Administered 2022-11-17 – 2022-11-22 (×5): 0.4 mg via ORAL
  Filled 2022-11-16 (×5): qty 1

## 2022-11-16 MED ORDER — FINASTERIDE 5 MG PO TABS
5.0000 mg | ORAL_TABLET | Freq: Every day | ORAL | Status: DC
  Administered 2022-11-17 – 2022-11-22 (×5): 5 mg via ORAL
  Filled 2022-11-16 (×5): qty 1

## 2022-11-16 MED ORDER — INSULIN ASPART 100 UNIT/ML IJ SOLN
0.0000 [IU] | Freq: Every day | INTRAMUSCULAR | Status: DC
  Filled 2022-11-16: qty 0.05

## 2022-11-16 MED ORDER — ASPIRIN 81 MG PO TBEC
81.0000 mg | DELAYED_RELEASE_TABLET | ORAL | Status: DC
  Administered 2022-11-18 – 2022-11-22 (×2): 81 mg via ORAL
  Filled 2022-11-16 (×4): qty 1

## 2022-11-16 MED ORDER — SERTRALINE HCL 100 MG PO TABS
100.0000 mg | ORAL_TABLET | Freq: Every day | ORAL | Status: DC
  Administered 2022-11-17 – 2022-11-22 (×5): 100 mg via ORAL
  Filled 2022-11-16 (×5): qty 1

## 2022-11-16 MED ORDER — SODIUM CHLORIDE 0.9 % IV SOLN: INTRAVENOUS | Status: DC

## 2022-11-16 MED ORDER — INSULIN ASPART 100 UNIT/ML IJ SOLN
0.0000 [IU] | Freq: Three times a day (TID) | INTRAMUSCULAR | Status: DC
  Administered 2022-11-16: 1 [IU] via SUBCUTANEOUS
  Administered 2022-11-16: 5 [IU] via SUBCUTANEOUS
  Administered 2022-11-18: 2 [IU] via SUBCUTANEOUS
  Administered 2022-11-19 – 2022-11-20 (×3): 1 [IU] via SUBCUTANEOUS
  Administered 2022-11-21: 2 [IU] via SUBCUTANEOUS
  Administered 2022-11-22 (×2): 1 [IU] via SUBCUTANEOUS
  Filled 2022-11-16: qty 0.09

## 2022-11-16 MED ORDER — SODIUM CHLORIDE 0.9 % IV SOLN: INTRAVENOUS | Status: AC

## 2022-11-16 MED ORDER — ATORVASTATIN CALCIUM 10 MG PO TABS: 10.0000 mg | ORAL_TABLET | ORAL | Status: DC

## 2022-11-16 MED ORDER — PANTOPRAZOLE SODIUM 40 MG PO TBEC
40.0000 mg | DELAYED_RELEASE_TABLET | Freq: Every day | ORAL | Status: DC
  Administered 2022-11-17 – 2022-11-22 (×5): 40 mg via ORAL
  Filled 2022-11-16 (×5): qty 1

## 2022-11-16 NOTE — Progress Notes (Signed)
Brief same day note:  Patient is a 82 year old male with history of advanced dementia, CKD stage IIIb, COPD, depression, type 2 diabetes, GERD, hypertension, hyperlipidemia, prior stroke, colon cancer status post right hemicolectomy who presented from home with complaint of confusion, generalized weakness.  CT head was negative for acute finding.  EKG showed sinus bradycardia .   Lab work showed creatinine of 1.7, vitamin B 12 level of 171.  Patient was admitted for the evaluation of altered mental status.  Patient seen and examined at bedside this morning in the ED.  During evaluation, he was hemodynamically stable.  Heart rate in the range of 50s.  He is very comfortable, no complaint of dizziness or lightheadedness.  Mental status looks near baseline  Assessment and plan:  Altered mentation on the background dementia: Has advanced dementia.  Brought by family due to concern of increased confusion from baseline.  UA notes history of UTI.  CT head negative for acute findings.  No focal neurological deficits on examination.  MRI did not show any acute findings. Noted to be low on vitamin B12 which could also contribute, started on supplementation.  Can continue IM supplementation while in hospital but needs to be on oral supplementation on discharge Takes donepezil, memantine at home. His mentation looks at baseline this morning.  He knows that he is in the hospital and he knows the correct month.Obeys commands  Sinus bradycardia: Monitor on telemetry.  Blood pressure stable.  Not on any AV nodal blocking agents.  TSH is normal.  Asymptomatic  AKI in CKD stage IIIb: Presented with creatinine in the range of 1.7, creatinine was 1.3 about a year ago.  Started on gentle IV fluids.  Overall looks euvolemic.  Type 2 diabetes: A1c of 9.8 as per June 201.  Repeat A1c pending.  Monitor blood sugars.  Continue sliding scale insulin.  Takes pioglitazone, Jardiance at home  Hypertension: Takes  hydrochlorothiazide, lisinopril at home.  Currently these are on hold and he is on gentle IV fluids.  Continue as needed medication for severe hypertension.  Might need to be restarted on these meds on discharge  History of COPD: Currently not in exacerbation.  Continue as needed bronchodilators  Thrombocytopenia: Mild.  Continue to monitor  History of stroke: On aspirin, Lipitor at home  History of BPH: On finasteride, tamsulosin

## 2022-11-16 NOTE — Plan of Care (Signed)
  Problem: Metabolic: Goal: Ability to maintain appropriate glucose levels will improve Outcome: Progressing   Problem: Nutritional: Goal: Maintenance of adequate nutrition will improve Outcome: Progressing   Problem: Skin Integrity: Goal: Risk for impaired skin integrity will decrease Outcome: Progressing   

## 2022-11-16 NOTE — H&P (Signed)
History and Physical    Ronald Dyer [place] [id number] DOB: April 28, 1941 DOA: 11/15/2022  PCP: [name], MD  Patient coming from: Home  Chief Complaint: Altered mental status  HPI: Ronald Dyer is a 82 y.o. male with medical history significant of advanced dementia, CKD stage IIIb, COPD, depression, type 2 diabetes, GERD, hypertension, hyperlipidemia, stroke, colon cancer status post right hemicolectomy presents to the ED for evaluation of confusion and generalized weakness.  Heart rate in the 40s to 50s, blood pressure stable.  Afebrile.  Labs showing no leukocytosis, hemoglobin 12.6 (at baseline), platelet count 148k, BUN 24, creatinine 1.7 (was 1.3 a year ago), normal LFTs, CK normal, troponin negative x 1, COVID/influenza/RSV PCR negative, lactic acid normal, UA not suggestive of infection.  Chest x-ray showing no active cardiopulmonary disease.  CT head negative for acute finding.  EKG showing sinus bradycardia, shortened PR interval, and no acute ischemic changes.   Patient is confused and not able to tell me why he is in the emergency room.  He has no complaints.  Denies headaches, chest pain, shortness of breath, or abdominal pain.  History provided by his wife at bedside who is concerned that patient has been increasingly more confused today and having generalized weakness.  States normally he is able to walk with a walker but today he was too weak and also not talking.  She denies any episodes of syncope or near syncope.   Review of Systems:  Review of Systems  Reason unable to perform ROS: AMS.    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, Dr Alen Blew d/c 06/2014    Stroke [facility])     Past Surgical History:  Procedure Laterality Date   cataracts Bilateral 2011   COLONOSCOPY WITH PROPOFOL N/A 11/09/2020   Procedure: COLONOSCOPY WITH PROPOFOL;  Surgeon: Wilford Corner, MD;  Location: [facility];  Service: Endoscopy;  Laterality: N/A;  Flex Sig-poor prep   ESOPHAGOGASTRODUODENOSCOPY (EGD) WITH PROPOFOL N/A 11/09/2020   Procedure: ESOPHAGOGASTRODUODENOSCOPY (EGD) WITH PROPOFOL;  Surgeon: Wilford Corner, MD;  Location: [facility];  Service: Endoscopy;  Laterality: N/A;   HEMICOLECTOMY Right [id number]   UMBILICAL HERNIA REPAIR  03/05/2009     reports that he quit smoking about 24 years ago. His smoking use included cigarettes. He has quit using smokeless tobacco. He reports that he does not drink alcohol and does not use drugs.  Allergies  Allergen Reactions   Metformin Hcl Diarrhea   Simvastatin Other (See Comments)    High CPK     Family History  Problem Relation Age of Onset   Cancer Father     Prior to Admission medications   Medication Sig Start Date End Date Taking? Authorizing Provider  ACCU-CHEK SMARTVIEW test strip  03/21/21   [provider]  aspirin 81 MG tablet Take 81 mg by mouth every other day.    [provider]  atorvastatin (LIPITOR) 10 MG tablet Take 10 mg by mouth See admin instructions. Take  10 mg by mouth on Tuesday(s) and Thursday(s)    [provider]  donepezil (ARICEPT) 10 MG tablet TAKE ONE TABLET BY MOUTH EVERYDAY AT BEDTIME Needs appointment for further refills 10/18/22   Ward Givens, NP  JARDIANCE 25 MG TABS tablet Take 25 mg by mouth daily.    [provider]  memantine (NAMENDA TITRATION PAK) tablet pack 5 mg/day for =1 week; 5 mg twice daily for =1 week; 15 mg/day given in 5 mg and 10 mg separated doses for =1 week;  then 10 mg twice daily Patient not taking: Reported on 11/12/2021 04/05/21   Ward Givens, NP  Multiple Vitamins-Minerals (MULTIVITAMIN WITH MINERALS) tablet Take 1 tablet by mouth daily.    [provider]  pantoprazole (PROTONIX) 40 MG tablet Take 1 tablet (40 mg total) by mouth daily at 6 (six) AM. 02/07/20   Hosie Poisson, MD  sertraline (ZOLOFT) 100 MG tablet Take 100 mg by mouth daily.    [provider]  tamsulosin (FLOMAX) 0.4 MG CAPS capsule Take 0.4 mg by mouth daily.    [provider]  vitamin B-12 (CYANOCOBALAMIN) 100 MCG tablet Take 50-100 mcg by mouth daily.    [provider]    Physical Exam: Vitals:   11/15/22 1922 11/15/22 1923 11/15/22 2315  BP: 138/63  (!) 156/62  Pulse: (!) 56  (!) 56  Resp: 17  13  Temp: 98 F (36.7 C)    TempSrc: Oral    SpO2: 97%  97%  Weight:  77 kg   Height: 6' (1.829 m)      Physical Exam Vitals reviewed.  Constitutional:      General: He is not in acute distress. HENT:     Head: Normocephalic and atraumatic.  Eyes:     Extraocular Movements: Extraocular movements intact.  Cardiovascular:     Rate and Rhythm: Normal rate and regular rhythm.     Pulses: Normal pulses.  Pulmonary:     Effort: Pulmonary effort is normal. No respiratory distress.     Breath sounds: Normal breath sounds. No wheezing or rales.  Abdominal:     General: Bowel sounds are normal. There is no distension.     Palpations: Abdomen is soft.     Tenderness: There is no abdominal tenderness.  Musculoskeletal:     Cervical back: Normal range of motion.     Right lower leg: No edema.     Left lower leg: No edema.  Skin:    General: Skin is warm and dry.  Neurological:     General: No focal deficit present.     Mental Status: He is alert.     Cranial Nerves: No cranial nerve deficit.     Sensory: No sensory deficit.     Motor: No weakness.     Comments: Oriented to person place.  He knows the month is March but thinks  the year is 2023.     Labs on Admission: I have personally reviewed following labs and imaging studies  CBC: Recent Labs  Lab 11/15/22 1934  WBC 6.1  NEUTROABS 3.7  HGB 12.6*  HCT 39.2  MCV 82.7  PLT 123456*   Basic Metabolic Panel: Recent Labs  Lab 11/15/22 1934  NA 139  K 3.8  CL 106  CO2 26  GLUCOSE 123*  BUN 24*  CREATININE 1.70*  CALCIUM 9.0   GFR: Estimated Creatinine Clearance: 35.9 mL/min (A) (by C-G formula based on SCr of 1.7 mg/dL (H)). Liver Function  Tests: Recent Labs  Lab 11/15/22 1934  AST 15  ALT 13  ALKPHOS 75  BILITOT 0.6  PROT 7.2  ALBUMIN 3.8   No results for input(s): "LIPASE", "AMYLASE" in the last 168 hours. No results for input(s): "AMMONIA" in the last 168 hours. Coagulation Profile: No results for input(s): "INR", "PROTIME" in the last 168 hours. Cardiac Enzymes: Recent Labs  Lab 11/15/22 1934  CKTOTAL 92   BNP (last 3 results) No results for input(s): "PROBNP" in the last 8760 hours. HbA1C: No results for input(s): "HGBA1C" in the last 72 hours. CBG: No results for input(s): "GLUCAP" in the last 168 hours. Lipid Profile: No results for input(s): "CHOL", "HDL", "LDLCALC", "TRIG", "CHOLHDL", "LDLDIRECT" in the last 72 hours. Thyroid Function Tests: No results for input(s): "TSH", "T4TOTAL", "FREET4", "T3FREE", "THYROIDAB" in the last 72 hours. Anemia Panel: No results for input(s): "VITAMINB12", "FOLATE", "FERRITIN", "TIBC", "IRON", "RETICCTPCT" in the last 72 hours. Urine analysis:    Component Value Date/Time   COLORURINE STRAW (A) 11/15/2022 2317   APPEARANCEUR CLEAR 11/15/2022 2317   LABSPEC 1.011 11/15/2022 2317   PHURINE 6.0 11/15/2022 2317   GLUCOSEU >=500 (A) 11/15/2022 2317   HGBUR NEGATIVE 11/15/2022 2317   BILIRUBINUR NEGATIVE 11/15/2022 2317   KETONESUR NEGATIVE 11/15/2022 2317   PROTEINUR NEGATIVE 11/15/2022 2317   NITRITE NEGATIVE 11/15/2022 2317   LEUKOCYTESUR NEGATIVE 11/15/2022 2317    Radiological  Exams on Admission: CT Head Wo Contrast  Result Date: 11/15/2022 CLINICAL DATA:  Altered mental status EXAM: CT HEAD WITHOUT CONTRAST TECHNIQUE: Contiguous axial images were obtained from the base of the skull through the vertex without intravenous contrast. RADIATION DOSE REDUCTION: This exam was performed according to the departmental dose-optimization program which includes automated exposure control, adjustment of the mA and/or kV according to patient size and/or use of iterative reconstruction technique. COMPARISON:  10/29/2021 FINDINGS: Brain: No evidence of acute infarction, hemorrhage, hydrocephalus, extra-axial collection or mass lesion/mass effect. Chronic atrophic and ischemic changes are noted. Vascular: No hyperdense vessel or unexpected calcification. Skull: Normal. Negative for fracture or focal lesion. Sinuses/Orbits: No acute finding. Other: None. IMPRESSION: Chronic atrophic and ischemic changes without acute intracranial abnormality. Electronically Signed   By: Inez Catalina M.D.   On: 11/15/2022 20:12   DG Chest 2 View  Result Date: 11/15/2022 CLINICAL DATA:  Altered mental status EXAM: CHEST - 2 VIEW COMPARISON:  Chest x-ray 09/01/2021 FINDINGS: The heart size and mediastinal contours are within normal limits. Both lungs are clear. The visualized skeletal structures are unremarkable. IMPRESSION: No active cardiopulmonary disease. Electronically Signed   By: Ronney Asters M.D.   On: 11/15/2022 19:57    Assessment and Plan  Altered mental status Patient with history of advanced dementia presenting with increasing confusion since yesterday morning.  CT head negative for acute finding and no focal neurodeficit on exam.  UA not suggestive of infection.  Brain MRI ordered for further evaluation.  Also checking TSH, B12, and ammonia levels.  Follow delirium precautions.  Sinus bradycardia Heart rate in the 40s to 50s, blood pressure stable.  Not on any AV nodal blocking agents.  Continue  cardiac monitoring and check TSH.  AKI on CKD stage IIIb Creatinine currently 1.7, was 1.3 on labs done a year ago.  Continue gentle IV fluid hydration and monitor renal function.  Avoid nephrotoxic agents.  Type 2 diabetes Last A1c 9.8 in June 2021.  Repeat A1c.  Sensitive sliding scale insulin.  Hypertension Systolic in the [id number] to 123456.  Pharmacy med rec pending. IV Hydralazine PRN SBP >180.  COPD: Stable, no signs of acute exacerbation. Depression GERD Hyperlipidemia History of stroke BPH Continue home meds after pharmacy med rec is done.  DVT prophylaxis: Lovenox Code Status: DNR/DNI (discussed with the patient's wife) Family Communication: Wife at bedside. Level of care: Telemetry bed Admission status: It is my clinical opinion that referral for OBSERVATION is reasonable and necessary in this patient based on the above information provided. The aforementioned taken together are felt to place the patient at high risk for further clinical deterioration. However, it is anticipated that the patient may be medically stable for discharge from the hospital within 24 to 48 hours.   Shela Leff MD [facility]  If 7PM-7AM, please contact night-coverage [email or web address]  11/16/2022, 12:50 AM

## 2022-11-16 NOTE — Evaluation (Addendum)
Physical Therapy Evaluation Patient Details Name: Ronald Dyer MRN: [id number] DOB: [birth year] Today's Date: 11/16/2022  History of Present Illness  Patient is a 82 year old male with history of advanced dementia, CKD stage IIIb, COPD, depression, type 2 diabetes, GERD, hypertension, hyperlipidemia, prior stroke, colon cancer status post right hemicolectomy who presented from home 11/15/22  with complaint of confusion, generalized weakness.  CT head was negative for acute finding.  Clinical Impression  Pt admitted with above diagnosis.  Pt currently with functional limitations due to the deficits listed below (see PT Problem List). Pt will benefit from acute skilled PT to increase their independence and safety with mobility to allow discharge.     The patient is alert and able to follow simple directions. Patient's wife/caregiver present.  At baseline, patient ambulatory with assistance and Rw, Able to feed self.  Patient did ambulate x 15' with Rw and 2 assist, gait became more shakey and unsteady, assisted to recliner.  Patient's wife reports plans will be for patient to return home and agrees with HHPT.  Continue PT for safe ambulation/mobility.     Recommendations for follow up therapy are one component of a multi-disciplinary discharge planning process, led by the attending physician.  Recommendations may be updated based on patient status, additional functional criteria and insurance authorization.  Follow Up Recommendations Can patient physically be transported by private vehicle: No     Assistance Recommended at Discharge Frequent or constant Supervision/Assistance  Patient can return home with the following  Two people to help with walking and/or transfers;A lot of help with bathing/dressing/bathroom;Assistance with cooking/housework;Assist for transportation;Help with stairs or ramp for entrance    Equipment Recommendations None recommended by PT  Recommendations for Other  Services       Functional Status Assessment Patient has had a recent decline in their functional status and demonstrates the ability to make significant improvements in function in a reasonable and predictable amount of time.     Precautions / Restrictions Precautions Precautions: Fall Precaution Comments: uses briefs Restrictions Weight Bearing Restrictions: No      Mobility  Bed Mobility Overal bed mobility: Needs Assistance Bed Mobility: Supine to Sit     Supine to sit: HOB elevated, Min assist     General bed mobility comments: extra time, rocks to scoot, more assist  to get to bed edge fully    Transfers Overall transfer level: Needs assistance Equipment used: Rolling walker (2 wheels) Transfers: Sit to/from Stand Sit to Stand: Mod assist, +2 safety/equipment, +2 physical assistance           General transfer comment: steady assist to rise from bed raised, Patient  pulled up on RW    Ambulation/Gait Ambulation/Gait assistance: Mod assist, +2 safety/equipment, +2 physical assistance Gait Distance (Feet): 15 Feet Assistive device: Rolling walker (2 wheels) Gait Pattern/deviations: Step-to pattern, Decreased weight shift to left Gait velocity: decr     General Gait Details: initial steps reciprocal, after passing through doorway, steps shuffling and unsteady, recliner brought up to patient, multimodal cues  to assist patient to sit down into recliner.  Stairs            Wheelchair Mobility    Modified Rankin (Stroke Patients Only)       Balance Overall balance assessment: Needs assistance Sitting-balance support: Bilateral upper extremity supported Sitting balance-Leahy Scale: Fair     Standing balance support: Bilateral upper extremity supported, During functional activity, Reliant on assistive device for balance Standing balance-Leahy Scale: Poor Standing balance  comment: posterior lean and  feet moving and not staying static                              Pertinent Vitals/Pain Pain Assessment Pain Assessment: No/denies pain    Home Living Family/patient expects to be discharged to:: Private residence Living Arrangements: Spouse/significant other Available Help at Discharge: Family;Available 24 hours/day Type of Home: House Home Access: Stairs to enter Entrance Stairs-Rails: Left Entrance Stairs-Number of Steps: 3   Home Layout: One level Home Equipment: Conservation officer, nature (2 wheels), WC Additional Comments: wife is sole  provider    Prior Function Prior Level of Function : Needs assist  Cognitive Assist : Mobility (cognitive);ADLs (cognitive) Mobility (Cognitive): Step by step cues ADLs (Cognitive): Step by step cues Physical Assist : Mobility (physical);ADLs (physical) Mobility (physical): Bed mobility;Gait ADLs (physical): Dressing;Toileting;Bathing Mobility Comments: uses a RW in home,  wife assists  with all activities ADLs Comments: wife does 75% of bath and dress, pt. feeds self     Hand Dominance   Dominant Hand: Right    Extremity/Trunk Assessment   Upper Extremity Assessment Upper Extremity Assessment: Overall WFL for tasks assessed    Lower Extremity Assessment Lower Extremity Assessment: Generalized weakness    Cervical / Trunk Assessment Cervical / Trunk Assessment: Kyphotic  Communication   Communication: No difficulties  Cognition Arousal/Alertness: Awake/alert Behavior During Therapy: WFL for tasks assessed/performed Overall Cognitive Status: History of cognitive impairments - at baseline                                 General Comments: able to follow directions, oriented to [facility], not date        General Comments      Exercises     Assessment/Plan    PT Assessment Patient needs continued PT services  PT Problem List Decreased strength;Decreased activity tolerance;Decreased mobility;Decreased balance;Decreased knowledge of precautions;Decreased safety  awareness       PT Treatment Interventions DME instruction;Therapeutic activities;Balance training;Cognitive remediation;Gait training;Functional mobility training;Therapeutic exercise;Patient/family education    PT Goals (Current goals can be found in the Care Plan section)  Acute Rehab PT Goals Patient Stated Goal: per wife, return home PT Goal Formulation: With family Time For Goal Achievement: 11/30/22    Frequency Min 3X/week     Co-evaluation               AM-PAC PT "6 Clicks" Mobility  Outcome Measure Help needed turning from your back to your side while in a flat bed without using bedrails?: A Lot Help needed moving from lying on your back to sitting on the side of a flat bed without using bedrails?: A Lot Help needed moving to and from a bed to a chair (including a wheelchair)?: A Lot Help needed standing up from a chair using your arms (e.g., wheelchair or bedside chair)?: A Lot Help needed to walk in hospital room?: A Lot Help needed climbing 3-5 steps with a railing? : Total 6 Click Score: 11    End of Session Equipment Utilized During Treatment: Gait belt Activity Tolerance: Patient tolerated treatment well Patient left: in chair;with call bell/phone within reach;with chair alarm set;with family/visitor present Nurse Communication: Mobility status PT Visit Diagnosis: Unsteadiness on feet (R26.81);Repeated falls (R29.6);Difficulty in walking, not elsewhere classified (R26.2);Other abnormalities of gait and mobility (R26.89)    Time: [id number] PT Time Calculation (min) (ACUTE  ONLY): 29 min   Charges:   PT Evaluation $PT Eval Low Complexity: 1 Low PT Treatments $Gait Training: 8-22 mins        [place] Office [phone] Weekend [id number]   Claretha Cooper 11/16/2022, 4:04 PM

## 2022-11-16 NOTE — ED Notes (Signed)
Family updated as to patient's status.

## 2022-11-17 ENCOUNTER — Encounter (HOSPITAL_COMMUNITY): Payer: Self-pay | Admitting: Internal Medicine

## 2022-11-17 DIAGNOSIS — F01518 Vascular dementia, unspecified severity, with other behavioral disturbance: Secondary | ICD-10-CM | POA: Diagnosis present

## 2022-11-17 DIAGNOSIS — Z85038 Personal history of other malignant neoplasm of large intestine: Secondary | ICD-10-CM | POA: Diagnosis not present

## 2022-11-17 DIAGNOSIS — E861 Hypovolemia: Secondary | ICD-10-CM | POA: Diagnosis present

## 2022-11-17 DIAGNOSIS — F03C3 Unspecified dementia, severe, with mood disturbance: Secondary | ICD-10-CM | POA: Diagnosis present

## 2022-11-17 DIAGNOSIS — F32A Depression, unspecified: Secondary | ICD-10-CM | POA: Diagnosis present

## 2022-11-17 DIAGNOSIS — Z8673 Personal history of transient ischemic attack (TIA), and cerebral infarction without residual deficits: Secondary | ICD-10-CM | POA: Diagnosis not present

## 2022-11-17 DIAGNOSIS — F0150 Vascular dementia without behavioral disturbance: Secondary | ICD-10-CM | POA: Diagnosis not present

## 2022-11-17 DIAGNOSIS — K219 Gastro-esophageal reflux disease without esophagitis: Secondary | ICD-10-CM | POA: Diagnosis present

## 2022-11-17 DIAGNOSIS — Z66 Do not resuscitate: Secondary | ICD-10-CM | POA: Diagnosis present

## 2022-11-17 DIAGNOSIS — E860 Dehydration: Secondary | ICD-10-CM | POA: Diagnosis present

## 2022-11-17 DIAGNOSIS — N400 Enlarged prostate without lower urinary tract symptoms: Secondary | ICD-10-CM | POA: Diagnosis present

## 2022-11-17 DIAGNOSIS — E11319 Type 2 diabetes mellitus with unspecified diabetic retinopathy without macular edema: Secondary | ICD-10-CM | POA: Diagnosis present

## 2022-11-17 DIAGNOSIS — Z87891 Personal history of nicotine dependence: Secondary | ICD-10-CM | POA: Diagnosis not present

## 2022-11-17 DIAGNOSIS — F03C0 Unspecified dementia, severe, without behavioral disturbance, psychotic disturbance, mood disturbance, and anxiety: Secondary | ICD-10-CM

## 2022-11-17 DIAGNOSIS — N179 Acute kidney failure, unspecified: Secondary | ICD-10-CM | POA: Diagnosis present

## 2022-11-17 DIAGNOSIS — N1832 Chronic kidney disease, stage 3b: Secondary | ICD-10-CM | POA: Diagnosis present

## 2022-11-17 DIAGNOSIS — J449 Chronic obstructive pulmonary disease, unspecified: Secondary | ICD-10-CM | POA: Diagnosis present

## 2022-11-17 DIAGNOSIS — D696 Thrombocytopenia, unspecified: Secondary | ICD-10-CM | POA: Diagnosis present

## 2022-11-17 DIAGNOSIS — I129 Hypertensive chronic kidney disease with stage 1 through stage 4 chronic kidney disease, or unspecified chronic kidney disease: Secondary | ICD-10-CM | POA: Diagnosis present

## 2022-11-17 DIAGNOSIS — E1122 Type 2 diabetes mellitus with diabetic chronic kidney disease: Secondary | ICD-10-CM | POA: Diagnosis present

## 2022-11-17 DIAGNOSIS — Z1152 Encounter for screening for COVID-19: Secondary | ICD-10-CM | POA: Diagnosis not present

## 2022-11-17 DIAGNOSIS — E7800 Pure hypercholesterolemia, unspecified: Secondary | ICD-10-CM | POA: Diagnosis present

## 2022-11-17 DIAGNOSIS — R001 Bradycardia, unspecified: Secondary | ICD-10-CM | POA: Diagnosis not present

## 2022-11-17 DIAGNOSIS — E440 Moderate protein-calorie malnutrition: Secondary | ICD-10-CM | POA: Diagnosis present

## 2022-11-17 DIAGNOSIS — I959 Hypotension, unspecified: Secondary | ICD-10-CM | POA: Diagnosis not present

## 2022-11-17 DIAGNOSIS — R4182 Altered mental status, unspecified: Secondary | ICD-10-CM | POA: Diagnosis present

## 2022-11-17 DIAGNOSIS — Z9049 Acquired absence of other specified parts of digestive tract: Secondary | ICD-10-CM | POA: Diagnosis not present

## 2022-11-17 LAB — BASIC METABOLIC PANEL
Anion gap: 8 (ref 5–15)
BUN: 20 mg/dL (ref 8–23)
CO2: 23 mmol/L (ref 22–32)
Calcium: 8.8 mg/dL — ABNORMAL LOW (ref 8.9–10.3)
Chloride: 103 mmol/L (ref 98–111)
Creatinine, Ser: 1.51 mg/dL — ABNORMAL HIGH (ref 0.61–1.24)
GFR, Estimated: 46 mL/min — ABNORMAL LOW (ref >60)
Glucose, Bld: 93 mg/dL (ref 70–99)
Potassium: 3.4 mmol/L — ABNORMAL LOW (ref 3.5–5.1)
Sodium: 134 mmol/L — ABNORMAL LOW (ref 135–145)

## 2022-11-17 LAB — BLOOD GAS, ARTERIAL
Acid-Base Excess: 0.6 mmol/L (ref 0.0–2.0)
Bicarbonate: 26 mmol/L (ref 20.0–28.0)
Drawn by: 20012
FIO2: 21 %
O2 Saturation: 97.3 %
Patient temperature: 36.4
pCO2 arterial: 43 mmHg (ref 32–48)
pH, Arterial: 7.39 (ref 7.35–7.45)
pO2, Arterial: 72 mmHg — ABNORMAL LOW (ref 83–108)

## 2022-11-17 LAB — GLUCOSE, CAPILLARY
Glucose-Capillary: 92 mg/dL (ref 70–99)
Glucose-Capillary: 98 mg/dL (ref 70–99)
Glucose-Capillary: 89 mg/dL (ref 70–99)
Glucose-Capillary: 100 mg/dL — ABNORMAL HIGH (ref 70–99)
Glucose-Capillary: 104 mg/dL — ABNORMAL HIGH (ref 70–99)

## 2022-11-17 NOTE — Significant Event (Signed)
Rapid Response Event Note   Reason for Call :  Decreased LOC  Initial Focused Assessment:  Patient laying in bed with eyes closed, left sided facial droop noted. [employer] attempted but patient unable to follow any commands. Patients wife at bedside and noted he could usually follow commands prior to hospitalization. When asked about patients left sided facial droop the patients wife stated that EMS noted the same drool two days prior. Patients Vital signs are normal, CBG in 90's, EKG showed no significant changes, CT and MRI negative for new changes, patient has had strokes in the past which are still noted on patients MRI from 11/16/22.  Interventions:  ABG ordered to ensure patient is not hypercapnic/ hypoxic   Plan of Care:  Upon discussions with MD Girguis, and patients wife we will continue to monitor patient. Patient is seen by hospice outpatient so it would be against patients wishes to have aggressive medical intervention.  MD Notified: Dwyane Dee, MD Call Bieber Time: [place]  Mirna Mires, RN

## 2022-11-17 NOTE — TOC Initial Note (Signed)
Transition of Care [facility]) - Initial/Assessment Note   Patient Details  Name: Ronald Dyer MRN: [id number] Date of Birth: [birth year]  Transition of Care [facility]) CM/SW Contact:    Sherie Don, LCSW Phone Number: 11/17/2022, 2:28 PM  Clinical Narrative: PT evaluation recommended SNF. Wife is agreeable to rehab and requested [facility] as first choice as patient has been there before. FL2 done; PASRR confirmed. Initial referral faxed out. TOC awaiting bed offers.  Expected Discharge Plan: Skilled Nursing Facility Barriers to Discharge: Continued Medical Work up, SNF Pending bed offer, Insurance Authorization  Patient Goals and CMS Choice Patient states their goals for this hospitalization and ongoing recovery are:: Go to short-term rehab CMS Medicare.gov Compare Post Acute Care list provided to:: Patient Represenative (must comment) Choice offered to / list presented to : Spouse  Expected Discharge Plan and Services In-house Referral: Clinical Social Work Post Acute Care Choice: [place] Living arrangements for the past 2 months: [place]          DME Arranged: N/A DME Agency: NA  Prior Living Arrangements/Services Living arrangements for the past 2 months: Single Family Home Lives with:: Spouse Patient language and need for interpreter reviewed:: Yes Do you feel safe going back to the place where you live?: Yes      Need for Family Participation in Patient Care: Yes (Comment) Care giver support system in place?: Yes (comment) Criminal Activity/Legal Involvement Pertinent to Current Situation/Hospitalization: No - Comment as needed  Activities of Daily Living Home Assistive Devices/Equipment: Gilford Rile (specify type) ADL Screening (condition at time of admission) Patient's cognitive ability adequate to safely complete daily activities?: Yes Is the patient deaf or have difficulty hearing?: No Does the patient have difficulty seeing, even when  wearing glasses/contacts?: No Does the patient have difficulty concentrating, remembering, or making decisions?: No Patient able to express need for assistance with ADLs?: Yes Does the patient have difficulty dressing or bathing?: No Independently performs ADLs?: No Communication: Independent Does the patient have difficulty walking or climbing stairs?: Yes Weakness of Legs: Both Weakness of Arms/Hands: None  Permission Sought/Granted Permission sought to share information with : Facility Art therapist granted to share information with : Yes, Verbal Permission Granted Permission granted to share info w AGENCY: SNFs  Emotional Assessment Orientation: : Oriented to Self, Oriented to Place, Oriented to Situation Alcohol / Substance Use: Not Applicable  Admission diagnosis:  Sinus bradycardia [R00.1] Altered mental status, unspecified altered mental status type [R41.82] AMS (altered mental status) [R41.82] Patient Active Problem List   Diagnosis Date Noted   Sinus bradycardia 11/16/2022   HTN (hypertension) 11/16/2022   HLD (hyperlipidemia) 11/16/2022   Malnutrition of moderate degree 11/15/2021   AKI (acute kidney injury) ([place]) 11/13/2021   Chronic kidney disease, stage 3b ([place]) 11/12/2021   Abnormal loss of weight 11/09/2020   Goals of care, counseling/discussion    Palliative care by specialist    Syncope 02/04/2020   BPH (benign prostatic hyperplasia) 02/04/2020   Confusion and disorientation 10/02/2019   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 ([place]) 04/24/2019   Aphasia as late effect of cerebrovascular accident 04/24/2019   Gait disturbance, post-stroke 04/24/2019   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 [place] [address] Phone: [phone] Fax: [phone]  Social Determinants of Health (SDOH) Social History: [place]: No Food Insecurity (11/16/2022)  Housing: Low Risk  (11/16/2022)  Transportation Needs: No Transportation Needs (11/16/2022)  Utilities: Not At Risk (11/16/2022)  Financial Resource Strain: Low Risk  (03/28/2019)  Physical Activity: Inactive (02/19/2019)  Tobacco Use: Medium Risk (11/17/2022)   SDOH Interventions: Housing Interventions: Intervention Not Indicated  Readmission Risk Interventions     No data to display

## 2022-11-17 NOTE — Progress Notes (Signed)
PT Cancellation Note  Patient Details Name: LANSING AGRAWAL MRN: [id number] DOB: Dec 12, 1940   Cancelled Treatment:    Reason Eval/Treat Not Completed: Medical issues which prohibited therapy   Kati L Payson 11/17/2022, 12:03 PM Arlyce Dice, DPT Physical Therapist Acute Rehabilitation Services Office: [phone]

## 2022-11-17 NOTE — Progress Notes (Signed)
Pt. Woke up alert to self and place. Requested to have his dinner, meal tray set up and pt. Eat independently. Patient care provided, pt. Had a BM.

## 2022-11-17 NOTE — NC FL2 (Signed)
[place] LEVEL OF CARE FORM     IDENTIFICATION  Patient Name: Ronald Dyer Birthdate: 12-29-1940 Sex: male Admission Date (Current Location): 11/15/2022  [facility] [place] Number:  Herbalist and Address:  [facility],  [place], [place]      Provider Number: [id number]  Attending Physician Name and Address:  Dwyane Dee, MD  Relative Name and Phone Number:  Harshan Jourdan (spouse) Ph: [phone]    Current Level of Care: Hospital Recommended Level of Care: [place] Prior Approval Number:    Date Approved/Denied:   PASRR Number: [id number]  Discharge Plan: SNF    Current Diagnoses: Patient Active Problem List   Diagnosis Date Noted   Sinus bradycardia 11/16/2022   HTN (hypertension) 11/16/2022   HLD (hyperlipidemia) 11/16/2022   Malnutrition of moderate degree 11/15/2021   AKI (acute kidney injury) ([place]) 11/13/2021   Chronic kidney disease, stage 3b ([place]) 11/12/2021   Abnormal loss of weight 11/09/2020   Goals of care, counseling/discussion    Palliative care by specialist    Syncope 02/04/2020   BPH (benign prostatic hyperplasia) 02/04/2020   Confusion and disorientation 10/02/2019   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 ([place]) 04/24/2019   Aphasia as late effect of cerebrovascular accident 04/24/2019   Gait disturbance, post-stroke 04/24/2019   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 ([place]) 04/24/2019   CVA (cerebral vascular accident) ([place]) 06/22/2017   Colon cancer ([place]) 03/25/2014   Type 2 diabetes mellitus ([place]) 10/18/2006   Depression 10/18/2006   ALCOHOL ABUSE, UNSPECIFIED 10/18/2006   COPD 10/18/2006   GASTROESOPHAGEAL REFLUX, NO ESOPHAGITIS 10/18/2006    Orientation RESPIRATION BLADDER Height & Weight     Self, Situation, Place  Normal Incontinent Weight: 169 lb 12.1 oz (77 kg) Height:   6' (182.9 cm)  BEHAVIORAL SYMPTOMS/MOOD NEUROLOGICAL BOWEL NUTRITION STATUS     (N/A) Continent Diet (Heart healthy/carb modified)  AMBULATORY STATUS COMMUNICATION OF NEEDS Skin   Extensive Assist Verbally Normal                       Personal Care Assistance Level of Assistance  Bathing, Feeding, Dressing Bathing Assistance: Limited assistance Feeding assistance: Limited assistance Dressing Assistance: Limited assistance     Functional Limitations Info  Sight, Hearing, Speech Sight Info: Impaired Hearing Info: Impaired Speech Info: Adequate    SPECIAL CARE FACTORS FREQUENCY  PT (By licensed PT), OT (By licensed OT)     PT Frequency: 5x's/week OT Frequency: 5x's/week            Contractures Contractures Info: Not present    Additional Factors Info  Code Status, Allergies, Psychotropic, Insulin Sliding Scale Code Status Info: DNR Allergies Info: Metformin Hcl, Simvastatin Psychotropic Info: Zoloft Insulin Sliding Scale Info: See discharge summary       Current Medications (11/17/2022):  This is the current hospital active medication list Current Facility-Administered Medications  Medication Dose Route Frequency Provider Last Rate Last Admin   0.9 %  sodium chloride infusion   Intravenous Continuous Shelly Coss, MD 75 mL/hr at 11/16/22 1300 New Bag at 11/16/22 1300   acetaminophen (TYLENOL) tablet 650 mg  650 mg Oral Q6H PRN Shela Leff, MD   650 mg at 11/16/22 2118   Or   acetaminophen (TYLENOL) suppository 650 mg  650 mg Rectal Q6H PRN Shela Leff, MD       aspirin EC tablet 81 mg  81 mg Oral Rosalia Hammers, MD       [START ON 11/21/2022] atorvastatin (LIPITOR) tablet 10 mg  10 mg Oral Once per day on Tue Thu Adhikari, Amrit, MD       cyanocobalamin (VITAMIN B12) injection 1,000 mcg  1,000 mcg Intramuscular Daily Shelly Coss, MD   1,000 mcg at 11/17/22 0851   donepezil (ARICEPT) tablet 10 mg  10 mg Oral QHS Shelly Coss, MD   10 mg  at 11/16/22 2118   enoxaparin (LOVENOX) injection 40 mg  40 mg Subcutaneous Q24H Shela Leff, MD   40 mg at 11/17/22 0850   finasteride (PROSCAR) tablet 5 mg  5 mg Oral Daily Shelly Coss, MD   5 mg at 11/17/22 0850   hydrALAZINE (APRESOLINE) injection 5 mg  5 mg Intravenous Q6H PRN Shela Leff, MD       insulin aspart (novoLOG) injection 0-5 Units  0-5 Units Subcutaneous QHS Shela Leff, MD       insulin aspart (novoLOG) injection 0-9 Units  0-9 Units Subcutaneous TID WC Shela Leff, MD   5 Units at 11/16/22 1748   pantoprazole (PROTONIX) EC tablet 40 mg  40 mg Oral Daily Shelly Coss, MD   40 mg at 11/17/22 0851   sertraline (ZOLOFT) tablet 100 mg  100 mg Oral Daily Shelly Coss, MD   100 mg at 11/17/22 0850   tamsulosin (FLOMAX) capsule 0.4 mg  0.4 mg Oral Daily Shelly Coss, MD   0.4 mg at 11/17/22 [id number]     Discharge Medications: Please see discharge summary for a list of discharge medications.  Relevant Imaging Results:  Relevant Lab Results:   Additional Information SSN: [id number]  Sherie Don, LCSW

## 2022-11-17 NOTE — Progress Notes (Signed)
Progress Note    Ronald Dyer   [id number]  DOB: [birth year]  DOA: 11/15/2022     0 PCP: [name], MD  Initial CC: Altered mentation, weakness  Hospital Course: Ronald Dyer is an 82 year old male with history of advanced dementia, CKD stage IIIb, COPD, depression, type 2 diabetes, GERD, hypertension, hyperlipidemia, prior stroke, colon cancer status post right hemicolectomy who presented from home with complaint of confusion, generalized weakness.  CT head was negative for acute finding.  EKG showed sinus bradycardia .   Lab work showed creatinine of 1.7, vitamin B 12 level of 171.  Patient was admitted for the evaluation of altered mental status. MRI was then performed which was negative for acute findings.  Does show underlying remote lacunar infarct of left thalamus and small infarcts involving perivascular spaces and basal ganglia. Patient's wife reports that he has more "bad" days than "good" days.  Appetite also varies at home. He was admitted for further monitoring and evaluation in case of any other reversible etiologies causing change in mentation.  Interval History:  No events overnight but patient's wife bedside this morning and patient was noted to be less responsive.  Rapid response was called.  Workup was overall negative including ABG.  After evaluation, he appeared to be stable and mentation appears mostly consistent with his baseline severe dementia.  Assessment and Plan:  AMS Advanced dementia -Patient brought in for worsening mentation although after monitoring in the hospital, this may be closer to his actual baseline with small variations as expected with advanced dementia -Infectious workup has been negative - B12 level was noted to be low which could possibly be contributing - He also had some mild dehydration from poor intake which again could also be contributing and cumulative effect -Hold off on any further workup for now as he appears to be at  his mental baseline - Patient has also undergone workup with CT head and MRI brain -If continues to remain stable, likely DC home tomorrow   Sinus bradycardia: Monitor on telemetry.  Blood pressure stable.  Not on any AV nodal blocking agents.  TSH is normal.  Asymptomatic   CKD stage IIIb - patient has history of CKD3b. Baseline creat ~ 1.5 - 1.7, eGFR~ 35 -Creatinine 1.7 on admission and he was possibly slightly hypovolemic/dehydrated appearing -Rehydrated with fluids and appears more euvolemic - Encourage diet as able   Type 2 diabetes: A1c of 9.8 as per June 201.  Repeat A1c pending.  Monitor blood sugars.  Continue sliding scale insulin.  Takes pioglitazone, Jardiance at home   Hypertension: Takes hydrochlorothiazide, lisinopril at home.   - hold meds - continue IVF for now   History of COPD: Currently not in exacerbation.  Continue as needed bronchodilators   Thrombocytopenia: Mild.  Continue to monitor   History of stroke: On aspirin, Lipitor at home   History of BPH: On finasteride, tamsulosin   Old records reviewed in assessment of this patient  Antimicrobials:   DVT prophylaxis:  enoxaparin (LOVENOX) injection 40 mg Start: 11/16/22 1000   Code Status:   Code Status: DNR  Mobility Assessment (last 72 hours)     Mobility Assessment     Row Name 11/17/22 0806 11/16/22 2200 11/16/22 1918 11/16/22 1553 11/16/22 1400   Does patient have an order for bedrest or is patient medically unstable No - Continue assessment No - Continue assessment No - Continue assessment -- No - Continue assessment   What is the highest level of  mobility based on the progressive mobility assessment? Level 4 (Walks with assist in room) - Balance while marching in place and cannot step forward and back - Complete Level 4 (Walks with assist in room) - Balance while marching in place and cannot step forward and back - Complete Level 5 (Walks with assist in room/hall) - Balance while stepping  forward/back and can walk in room with assist - Complete Level 5 (Walks with assist in room/hall) - Balance while stepping forward/back and can walk in room with assist - Complete --            Barriers to discharge: none Disposition Plan:  Home Saturday Status is: Inpt  Objective: Blood pressure (!) 135/57, pulse (!) 44, temperature 97.7 F (36.5 C), temperature source Oral, resp. rate 16, height 6' (1.829 m), weight 77 kg, SpO2 94 %.  Examination:  Physical Exam Constitutional:      Comments: Chronically ill-appearing elderly gentleman lying in bed in no distress with obvious underlying dementia  HENT:     Head: Normocephalic and atraumatic.     Mouth/Throat:     Mouth: Mucous membranes are moist.  Eyes:     Extraocular Movements: Extraocular movements intact.  Cardiovascular:     Rate and Rhythm: Normal rate and regular rhythm.  Pulmonary:     Effort: Pulmonary effort is normal. No respiratory distress.     Breath sounds: Normal breath sounds. No wheezing.  Abdominal:     General: Bowel sounds are normal. There is no distension.     Palpations: Abdomen is soft.     Tenderness: There is no abdominal tenderness.  Musculoskeletal:        General: Normal range of motion.     Cervical back: Normal range of motion and neck supple.  Skin:    General: Skin is warm and dry.  Neurological:     Comments: Not following commands.  Does answer some questions      Consultants:    Procedures:    Data Reviewed: Results for orders placed or performed during the hospital encounter of 11/15/22 (from the past 24 hour(s))  CBG monitoring, ED     Status: Abnormal   Collection Time: 11/16/22 12:51 PM  Result Value Ref Range   Glucose-Capillary 124 (H) 70 - 99 mg/dL  Glucose, capillary     Status: Abnormal   Collection Time: 11/16/22  4:04 PM  Result Value Ref Range   Glucose-Capillary 269 (H) 70 - 99 mg/dL  Glucose, capillary     Status: Abnormal   Collection Time: 11/16/22   8:55 PM  Result Value Ref Range   Glucose-Capillary 69 (L) 70 - 99 mg/dL  Glucose, capillary     Status: Abnormal   Collection Time: 11/16/22 10:03 PM  Result Value Ref Range   Glucose-Capillary 110 (H) 70 - 99 mg/dL  Basic metabolic panel     Status: Abnormal   Collection Time: 11/17/22  5:22 AM  Result Value Ref Range   Sodium 134 (L) 135 - 145 mmol/L   Potassium 3.4 (L) 3.5 - 5.1 mmol/L   Chloride 103 98 - 111 mmol/L   CO2 23 22 - 32 mmol/L   Glucose, Bld 93 70 - 99 mg/dL   BUN 20 8 - 23 mg/dL   Creatinine, Ser 1.51 (H) 0.61 - 1.24 mg/dL   Calcium 8.8 (L) 8.9 - 10.3 mg/dL   GFR, Estimated 46 (L) >60 mL/min   Anion gap 8 5 - 15  Glucose, capillary  Status: None   Collection Time: 11/17/22  7:39 AM  Result Value Ref Range   Glucose-Capillary 92 70 - 99 mg/dL  Glucose, capillary     Status: None   Collection Time: 11/17/22 10:12 AM  Result Value Ref Range   Glucose-Capillary 98 70 - 99 mg/dL  Blood gas, arterial     Status: Abnormal   Collection Time: 11/17/22 10:20 AM  Result Value Ref Range   FIO2 21 %   O2 Content ROOM AIR L/min   pH, Arterial 7.39 7.35 - 7.45   pCO2 arterial 43 32 - 48 mmHg   pO2, Arterial 72 (L) 83 - 108 mmHg   Bicarbonate 26.0 20.0 - 28.0 mmol/L   Acid-Base Excess 0.6 0.0 - 2.0 mmol/L   O2 Saturation 97.3 %   Patient temperature 36.4    Collection site RIGHT RADIAL    Drawn by [id number]    Allens test (pass/fail) PASS PASS  Glucose, capillary     Status: None   Collection Time: 11/17/22 11:26 AM  Result Value Ref Range   Glucose-Capillary 89 70 - 99 mg/dL    I have reviewed pertinent nursing notes, vitals, labs, and images as necessary. I have ordered labwork to follow up on as indicated.  I have reviewed the last notes from staff over past 24 hours. I have discussed patient's care plan and test results with nursing staff, CM/SW, and other staff as appropriate.  Time spent: Greater than 50% of the 55 minute visit was spent in  counseling/coordination of care for the patient as laid out in the A&P.   LOS: 0 days   Dwyane Dee, MD [facility] 11/17/2022, 12:21 PM

## 2022-11-17 NOTE — Progress Notes (Signed)
Pt. Appears drowsy and lethargic. Pt. Is confused, speech is clear but incomprehensible. Wife at bedside concern of mentation status. Vital sign take BP 125/ 52 Pulse 50 O2 saturation 100% on RA. Blood glucose 98. Called RR for further eval. MD was notified and came to bedside. ABG ordered will continue to monitor patient.

## 2022-11-17 NOTE — Plan of Care (Signed)

## 2022-11-17 NOTE — Hospital Course (Signed)
Mr. Bramson is an 82 year old male with history of advanced dementia, CKD stage IIIb, COPD, depression, type 2 diabetes, GERD, hypertension, hyperlipidemia, prior stroke, colon cancer status post right hemicolectomy who presented from home with complaint of confusion, generalized weakness.  CT head was negative for acute finding.  EKG showed sinus bradycardia .   Lab work showed creatinine of 1.7, vitamin B 12 level of 171.  Patient was admitted for the evaluation of altered mental status. MRI was then performed which was negative for acute findings.  Does show underlying remote lacunar infarct of left thalamus and small infarcts involving perivascular spaces and basal ganglia. Patient's wife reports that he has more "bad" days than "good" days.  Appetite also varies at home. He was admitted for further monitoring and evaluation in case of any other reversible etiologies causing change in mentation.

## 2022-11-18 DIAGNOSIS — F0150 Vascular dementia without behavioral disturbance: Secondary | ICD-10-CM | POA: Diagnosis not present

## 2022-11-18 DIAGNOSIS — R4182 Altered mental status, unspecified: Secondary | ICD-10-CM | POA: Diagnosis not present

## 2022-11-18 LAB — GLUCOSE, CAPILLARY
Glucose-Capillary: 78 mg/dL (ref 70–99)
Glucose-Capillary: 171 mg/dL — ABNORMAL HIGH (ref 70–99)
Glucose-Capillary: 102 mg/dL — ABNORMAL HIGH (ref 70–99)
Glucose-Capillary: 149 mg/dL — ABNORMAL HIGH (ref 70–99)

## 2022-11-18 NOTE — Progress Notes (Signed)
Progress Note    Ronald Dyer   [id number]  DOB: Dec 28, 1940  DOA: 11/15/2022     1 PCP: [name], MD  Initial CC: Altered mentation, weakness  Hospital Course: [name] is an 82 year old male with history of advanced dementia, CKD stage IIIb, COPD, depression, type 2 diabetes, GERD, hypertension, hyperlipidemia, prior stroke, colon cancer status post right hemicolectomy who presented from home with complaint of confusion, generalized weakness.  CT head was negative for acute finding.  EKG showed sinus bradycardia .   Lab work showed creatinine of 1.7, vitamin B 12 level of 171.  Patient was admitted for the evaluation of altered mental status. MRI was then performed which was negative for acute findings.  Does show underlying remote lacunar infarct of left thalamus and small infarcts involving perivascular spaces and basal ganglia. Patient's wife reports that he has more "bad" days than "good" days.  Appetite also varies at home. He was admitted for further monitoring and evaluation in case of any other reversible etiologies causing change in mentation.  Interval History:  No events overnight.  Patient much more awake and alert this morning.  Answering some questions and now following commands easily.  Assessment and Plan:  AMS -resolved Advanced dementia -Patient brought in for worsening mentation although after monitoring in the hospital, this may be closer to his actual baseline with small variations as expected with advanced dementia -Infectious workup has been negative - B12 level was noted to be low which could possibly be contributing - He also had some mild dehydration from poor intake which again could also be contributing and cumulative effect -Hold off on any further workup for now as he appears to be at his mental baseline - Patient has also undergone workup with CT head and MRI brain -Mentation now back to baseline.  Medically stable for discharge once  rehab found   Sinus bradycardia - remained stable; okay to d/c tele   CKD stage IIIb - patient has history of CKD3b. Baseline creat ~ 1.5 - 1.7, eGFR~ 35 -Creatinine 1.7 on admission and he was possibly slightly hypovolemic/dehydrated appearing - s/p IVF - Encourage diet as able   Type 2 diabetes - A1c 6.4% on 11/16/22 - continue SSI and CBG monitoring    Hypertension: Takes hydrochlorothiazide, lisinopril at home.   - hold meds - continue IVF for now   History of COPD: Currently not in exacerbation.  Continue as needed bronchodilators   Thrombocytopenia: Mild.  Continue to monitor   History of stroke: On aspirin, Lipitor at home   History of BPH: On finasteride, tamsulosin   Old records reviewed in assessment of this patient  Antimicrobials:   DVT prophylaxis:  enoxaparin (LOVENOX) injection 40 mg Start: 11/16/22 1000   Code Status:   Code Status: DNR  Mobility Assessment (last 72 hours)     Mobility Assessment     Row Name 11/17/22 2000 11/17/22 0806 11/16/22 2200 11/16/22 1918 11/16/22 1553   Does patient have an order for bedrest or is patient medically unstable No - Continue assessment No - Continue assessment No - Continue assessment No - Continue assessment --   What is the highest level of mobility based on the progressive mobility assessment? Level 4 (Walks with assist in room) - Balance while marching in place and cannot step forward and back - Complete Level 4 (Walks with assist in room) - Balance while marching in place and cannot step forward and back - Complete Level 4 (Walks with  assist in room) - Balance while marching in place and cannot step forward and back - Complete Level 5 (Walks with assist in room/hall) - Balance while stepping forward/back and can walk in room with assist - Complete Level 5 (Walks with assist in room/hall) - Balance while stepping forward/back and can walk in room with assist - Complete    Row Name 11/16/22 1400            Does patient have an order for bedrest or is patient medically unstable No - Continue assessment                Barriers to discharge: none Disposition Plan:  SNF Status is: Inpt  Objective: Blood pressure (!) 102/56, pulse (!) 56, temperature 99.3 F (37.4 C), temperature source Oral, resp. rate 20, height 6' (1.829 m), weight 77 kg, SpO2 95 %.  Examination:  Physical Exam Constitutional:      Comments: Chronically ill-appearing elderly gentleman lying in bed in no distress with obvious underlying dementia  HENT:     Head: Normocephalic and atraumatic.     Mouth/Throat:     Mouth: Mucous membranes are moist.  Eyes:     Extraocular Movements: Extraocular movements intact.  Cardiovascular:     Rate and Rhythm: Normal rate and regular rhythm.  Pulmonary:     Effort: Pulmonary effort is normal. No respiratory distress.     Breath sounds: Normal breath sounds. No wheezing.  Abdominal:     General: Bowel sounds are normal. There is no distension.     Palpations: Abdomen is soft.     Tenderness: There is no abdominal tenderness.  Musculoskeletal:        General: Normal range of motion.     Cervical back: Normal range of motion and neck supple.  Skin:    General: Skin is warm and dry.  Neurological:     Comments: Follows commands and moves all 4 extremities       Consultants:    Procedures:    Data Reviewed: Results for orders placed or performed during the hospital encounter of 11/15/22 (from the past 24 hour(s))  Glucose, capillary     Status: Abnormal   Collection Time: 11/17/22  4:01 PM  Result Value Ref Range   Glucose-Capillary 100 (H) 70 - 99 mg/dL  Glucose, capillary     Status: Abnormal   Collection Time: 11/17/22  9:13 PM  Result Value Ref Range   Glucose-Capillary 104 (H) 70 - 99 mg/dL  Glucose, capillary     Status: None   Collection Time: 11/18/22  7:37 AM  Result Value Ref Range   Glucose-Capillary 78 70 - 99 mg/dL   Comment 1 Notify RN    Glucose, capillary     Status: Abnormal   Collection Time: 11/18/22 12:08 PM  Result Value Ref Range   Glucose-Capillary 171 (H) 70 - 99 mg/dL   Comment 1 Notify RN     I have reviewed pertinent nursing notes, vitals, labs, and images as necessary. I have ordered labwork to follow up on as indicated.  I have reviewed the last notes from staff over past 24 hours. I have discussed patient's care plan and test results with nursing staff, CM/SW, and other staff as appropriate.  Time spent: Greater than 50% of the 55 minute visit was spent in counseling/coordination of care for the patient as laid out in the A&P.   LOS: 1 day   Dwyane Dee, MD [facility] 11/18/2022, 1:37  PM

## 2022-11-19 DIAGNOSIS — F0150 Vascular dementia without behavioral disturbance: Secondary | ICD-10-CM | POA: Diagnosis not present

## 2022-11-19 DIAGNOSIS — R4182 Altered mental status, unspecified: Secondary | ICD-10-CM | POA: Diagnosis not present

## 2022-11-19 LAB — GLUCOSE, CAPILLARY
Glucose-Capillary: 101 mg/dL — ABNORMAL HIGH (ref 70–99)
Glucose-Capillary: 130 mg/dL — ABNORMAL HIGH (ref 70–99)
Glucose-Capillary: 112 mg/dL — ABNORMAL HIGH (ref 70–99)
Glucose-Capillary: 154 mg/dL — ABNORMAL HIGH (ref 70–99)

## 2022-11-19 MED ORDER — GLUCERNA SHAKE PO LIQD
237.0000 mL | Freq: Three times a day (TID) | ORAL | Status: DC
  Administered 2022-11-19 – 2022-11-22 (×9): 237 mL via ORAL
  Filled 2022-11-19 (×12): qty 237

## 2022-11-19 NOTE — Progress Notes (Signed)
Physical Therapy Treatment Patient Details Name: Ronald Dyer MRN: [id number] DOB: 1941-01-19 Today's Date: 11/19/2022   History of Present Illness Patient is a 82 year old male with history of advanced dementia, CKD stage IIIb, COPD, depression, type 2 diabetes, GERD, hypertension, hyperlipidemia, prior stroke, colon cancer status post right hemicolectomy who presented from home 11/15/22  with complaint of confusion, generalized weakness.  CT head was negative for acute finding.    PT Comments    Pt following simple commands and agreeable to mobilize.  Pt assisted with sitting EOB however unable to self maintain upright posture (reliant on external support).  Pt assisted back to bed and repositioned in sidelyling.  Pt reported, "you made me tired, so I guess you did good."    Recommendations for follow up therapy are one component of a multi-disciplinary discharge planning process, led by the attending physician.  Recommendations may be updated based on patient status, additional functional criteria and insurance authorization.  Follow Up Recommendations  Can patient physically be transported by private vehicle: No    Assistance Recommended at Discharge Frequent or constant Supervision/Assistance  Patient can return home with the following Two people to help with walking and/or transfers;A lot of help with bathing/dressing/bathroom;Assistance with cooking/housework;Assist for transportation;Help with stairs or ramp for entrance   Equipment Recommendations  None recommended by PT    Recommendations for Other Services       Precautions / Restrictions Precautions Precautions: Fall Precaution Comments: uses briefs     Mobility  Bed Mobility Overal bed mobility: Needs Assistance Bed Mobility: Supine to Sit, Sit to Sidelying     Supine to sit: Max assist   Sit to sidelying: Max assist General bed mobility comments: increased time and cues provided; requiring assist for LEs  over EOB and trunk upright; assist for propping down onto elbow and bringing LEs onto bed; pt repositioning in sidelying    Transfers                   General transfer comment: pt unable    Ambulation/Gait                   Stairs             Wheelchair Mobility    Modified Rankin (Stroke Patients Only)       Balance Overall balance assessment: Needs assistance Sitting-balance support: Bilateral upper extremity supported, Feet supported Sitting balance-Leahy Scale: Zero Sitting balance - Comments: pt unable to maintain midline posture; required external assist for sitting upright; only able to hold briefly (less then 5 seconds) without external support Postural control: Right lateral lean                                  Cognition Arousal/Alertness: Awake/alert Behavior During Therapy: Flat affect Overall Cognitive Status: History of cognitive impairments - at baseline                                 General Comments: able to follow directions, oriented to [facility]        Exercises      General Comments        Pertinent Vitals/Pain Pain Assessment Pain Assessment: No/denies pain    Home Living  Prior Function            PT Goals (current goals can now be found in the care plan section) Progress towards PT goals: Progressing toward goals    Frequency    Min 2X/week      PT Plan Current plan remains appropriate    Co-evaluation              AM-PAC PT "6 Clicks" Mobility   Outcome Measure  Help needed turning from your back to your side while in a flat bed without using bedrails?: Total Help needed moving from lying on your back to sitting on the side of a flat bed without using bedrails?: Total Help needed moving to and from a bed to a chair (including a wheelchair)?: Total Help needed standing up from a chair using your arms (e.g., wheelchair or bedside  chair)?: Total Help needed to walk in hospital room?: Total Help needed climbing 3-5 steps with a railing? : Total 6 Click Score: 6    End of Session Equipment Utilized During Treatment: Gait belt Activity Tolerance: Patient tolerated treatment well Patient left: with call bell/phone within reach;with family/visitor present;in bed;with bed alarm set Nurse Communication: Mobility status PT Visit Diagnosis: Repeated falls (R29.6);Other abnormalities of gait and mobility (R26.89)     Time: 1050-1101 PT Time Calculation (min) (ACUTE ONLY): 11 min  Charges:  $Therapeutic Activity: 8-22 mins                    Arlyce Dice, DPT Physical Therapist Acute Rehabilitation Services Office: [place] 11/19/2022, 12:52 PM

## 2022-11-19 NOTE — Progress Notes (Signed)
Progress Note    Ronald Dyer   [id number]  DOB: 1940/10/31  DOA: 11/15/2022     2 PCP: [name], MD  Initial CC: Altered mentation, weakness  Hospital Course: Mr. Bellman is an 82 year old male with history of advanced dementia, CKD stage IIIb, COPD, depression, type 2 diabetes, GERD, hypertension, hyperlipidemia, prior stroke, colon cancer status post right hemicolectomy who presented from home with complaint of confusion, generalized weakness.  CT head was negative for acute finding.  EKG showed sinus bradycardia .   Lab work showed creatinine of 1.7, vitamin B 12 level of 171.  Patient was admitted for the evaluation of altered mental status. MRI was then performed which was negative for acute findings.  Does show underlying remote lacunar infarct of left thalamus and small infarcts involving perivascular spaces and basal ganglia. Patient's wife reports that he has more "bad" days than "good" days.  Appetite also varies at home. He was admitted for further monitoring and evaluation in case of any other reversible etiologies causing change in mentation.  Interval History:  No events overnight. Eating some b'fast when seen this morning. Some waxing/waning of his mentation not unexpected. Overall stable; awaiting SNF.   Assessment and Plan:  AMS -resolved Advanced dementia -Patient brought in for worsening mentation although after monitoring in the hospital, this may be closer to his actual baseline with small variations as expected with advanced dementia -Infectious workup has been negative - B12 level was noted to be low which could possibly be contributing - He also had some mild dehydration from poor intake which again could also be contributing and cumulative effect -Hold off on any further workup for now as he appears to be at his mental baseline - Patient has also undergone workup with CT head and MRI brain -Mentation now back to baseline.  Medically stable for  discharge once rehab found   Sinus bradycardia - remained stable; okay to d/c tele   CKD stage IIIb - patient has history of CKD3b. Baseline creat ~ 1.5 - 1.7, eGFR~ 35 -Creatinine 1.7 on admission and he was possibly slightly hypovolemic/dehydrated appearing - s/p IVF - Encourage diet as able   Type 2 diabetes - A1c 6.4% on 11/16/22 - continue SSI and CBG monitoring    Hypertension: Takes hydrochlorothiazide, lisinopril at home.   - hold meds - continue IVF for now   History of COPD: Currently not in exacerbation.  Continue as needed bronchodilators   Thrombocytopenia: Mild.  Continue to monitor   History of stroke: On aspirin, Lipitor at home   History of BPH: On finasteride, tamsulosin   Old records reviewed in assessment of this patient  Antimicrobials:   DVT prophylaxis:  enoxaparin (LOVENOX) injection 40 mg Start: 11/16/22 1000   Code Status:   Code Status: DNR  Mobility Assessment (last 72 hours)     Mobility Assessment     Row Name 11/18/22 1937 11/17/22 2000 11/17/22 0806 11/16/22 2200 11/16/22 1918   Does patient have an order for bedrest or is patient medically unstable No - Continue assessment No - Continue assessment No - Continue assessment No - Continue assessment No - Continue assessment   What is the highest level of mobility based on the progressive mobility assessment? Level 4 (Walks with assist in room) - Balance while marching in place and cannot step forward and back - Complete Level 4 (Walks with assist in room) - Balance while marching in place and cannot step forward and back - Complete  Level 4 (Walks with assist in room) - Balance while marching in place and cannot step forward and back - Complete Level 4 (Walks with assist in room) - Balance while marching in place and cannot step forward and back - Complete Level 5 (Walks with assist in room/hall) - Balance while stepping forward/back and can walk in room with assist - Complete    Row Name  11/16/22 1553 11/16/22 1400         Does patient have an order for bedrest or is patient medically unstable -- No - Continue assessment      What is the highest level of mobility based on the progressive mobility assessment? Level 5 (Walks with assist in room/hall) - Balance while stepping forward/back and can walk in room with assist - Complete --               Barriers to discharge: none Disposition Plan:  SNF Status is: Inpt  Objective: Blood pressure (!) 139/53, pulse 62, temperature 98.4 F (36.9 C), resp. rate 16, height 6' (1.829 m), weight 77 kg, SpO2 95 %.  Examination:  Physical Exam Constitutional:      Comments: Chronically ill-appearing elderly gentleman lying in bed in no distress with obvious underlying dementia  HENT:     Head: Normocephalic and atraumatic.     Mouth/Throat:     Mouth: Mucous membranes are moist.  Eyes:     Extraocular Movements: Extraocular movements intact.  Cardiovascular:     Rate and Rhythm: Normal rate and regular rhythm.  Pulmonary:     Effort: Pulmonary effort is normal. No respiratory distress.     Breath sounds: Normal breath sounds. No wheezing.  Abdominal:     General: Bowel sounds are normal. There is no distension.     Palpations: Abdomen is soft.     Tenderness: There is no abdominal tenderness.  Musculoskeletal:        General: Normal range of motion.     Cervical back: Normal range of motion and neck supple.  Skin:    General: Skin is warm and dry.  Neurological:     Comments: Follows commands and moves all 4 extremities       Consultants:    Procedures:    Data Reviewed: Results for orders placed or performed during the hospital encounter of 11/15/22 (from the past 24 hour(s))  Glucose, capillary     Status: Abnormal   Collection Time: 11/18/22 12:08 PM  Result Value Ref Range   Glucose-Capillary 171 (H) 70 - 99 mg/dL   Comment 1 Notify RN   Glucose, capillary     Status: Abnormal   Collection Time:  11/18/22  4:49 PM  Result Value Ref Range   Glucose-Capillary 102 (H) 70 - 99 mg/dL   Comment 1 Notify RN   Glucose, capillary     Status: Abnormal   Collection Time: 11/18/22  8:33 PM  Result Value Ref Range   Glucose-Capillary 149 (H) 70 - 99 mg/dL  Glucose, capillary     Status: Abnormal   Collection Time: 11/19/22  7:38 AM  Result Value Ref Range   Glucose-Capillary 101 (H) 70 - 99 mg/dL    I have reviewed pertinent nursing notes, vitals, labs, and images as necessary. I have ordered labwork to follow up on as indicated.  I have reviewed the last notes from staff over past 24 hours. I have discussed patient's care plan and test results with nursing staff, CM/SW, and other staff as  appropriate.    LOS: 2 days   Dwyane Dee, MD [facility] 11/19/2022, 11:15 AM

## 2022-11-20 DIAGNOSIS — F0150 Vascular dementia without behavioral disturbance: Secondary | ICD-10-CM | POA: Diagnosis not present

## 2022-11-20 DIAGNOSIS — R4182 Altered mental status, unspecified: Secondary | ICD-10-CM | POA: Diagnosis not present

## 2022-11-20 LAB — GLUCOSE, CAPILLARY
Glucose-Capillary: 131 mg/dL — ABNORMAL HIGH (ref 70–99)
Glucose-Capillary: 123 mg/dL — ABNORMAL HIGH (ref 70–99)
Glucose-Capillary: 115 mg/dL — ABNORMAL HIGH (ref 70–99)
Glucose-Capillary: 146 mg/dL — ABNORMAL HIGH (ref 70–99)

## 2022-11-20 NOTE — Progress Notes (Signed)
    Referral for [place] to review and look to see if this pt would be appropitae for our hospice facility. We did review the chart and discuss with our MD and at this time the pt is not felt to be appropriate for our hospice facility. However, if he was to continue to further decline then could be appropriate. I will follow until d/c to see if the needs changed and he does become appropriate. I have reached out to the wife and left a voicemail message for her to return my call to discuss this or meet with me. It is felt he would benefit from hospice care at [facility].   Webb Silversmith RN [phone]

## 2022-11-20 NOTE — Progress Notes (Signed)
Initial Nutrition Assessment  DOCUMENTATION CODES:   Non-severe (moderate) malnutrition in context of chronic illness  INTERVENTION:   -Continue Glucerna Shake po TID, each supplement provides 220 kcal and 10 grams of protein as appropriate   NUTRITION DIAGNOSIS:   Moderate Malnutrition related to chronic illness (advanced dementia) as evidenced by mild fat depletion, moderate muscle depletion, percent weight loss.  GOAL:   Patient will meet greater than or equal to 90% of their needs  MONITOR:   PO intake, Supplement acceptance, Labs, Weight trends, I & O's, GOC  REASON FOR ASSESSMENT:   Malnutrition Screening Tool    ASSESSMENT:   82 year old male with history of advanced dementia, CKD stage IIIb, COPD, depression, type 2 diabetes, GERD, hypertension, hyperlipidemia, prior stroke, colon cancer status post right hemicolectomy who presented from home with complaint of confusion, generalized weakness.  Patient in room, wife at bedside. Pt very lethargic. Pt's wife states  he is having one of his worst days today. Pt is not waking enough to eat. States pt was eating yesterday and drank some of the Glucernas but not finishing them. On good days pt will eat a little bit. Wife states the plan for pt is still being decided.  RD noted consult to [facility] for [facility].   Per weight records, pt has lost 30 lbs since 1/12 (15% wt loss x 2.5 months, significant for time frame).  Medications reviewed.  Labs reviewed: CBGs: 102-154   NUTRITION - FOCUSED PHYSICAL EXAM:  Flowsheet Row Most Recent Value  Orbital Region Moderate depletion  Upper Arm Region Mild depletion  Thoracic and Lumbar Region Unable to assess  Buccal Region Mild depletion  Temple Region Moderate depletion  Clavicle Bone Region Severe depletion  Clavicle and Acromion Bone Region Moderate depletion  Scapular Bone Region Unable to assess  [lethargy]  Dorsal Hand Mild depletion  Patellar Region Unable to  assess  Anterior Thigh Region Unable to assess  Posterior Calf Region Unable to assess  Edema (RD Assessment) None  Hair Reviewed  Eyes Unable to assess  [kept eyes closed]  Mouth Reviewed  [missing teeth]  Skin Reviewed  [dry]  Nails Reviewed       Diet Order:   Diet Order             Diet Carb Modified Fluid consistency: Thin; Room service appropriate? Yes  Diet effective now                   EDUCATION NEEDS:   No education needs have been identified at this time  Skin:  Skin Assessment: Reviewed RN Assessment  Last BM:  3/29 -type 4  Height:   Ht Readings from Last 1 Encounters:  11/15/22 6' (1.829 m)    Weight:   Wt Readings from Last 1 Encounters:  11/15/22 77 kg    BMI:  Body mass index is 23.02 kg/m.  Estimated Nutritional Needs:   Kcal:  1900-2100  Protein:  85-95g  Fluid:  2L/day  Clayton Bibles, MS, RD, LDN Inpatient Clinical Dietitian Contact information available via Amion

## 2022-11-20 NOTE — Progress Notes (Signed)
Progress Note    Ronald Dyer   [id number]  DOB: September 27, 1940  DOA: 11/15/2022     3 PCP: [name], MD  Initial CC: Altered mentation, weakness  Hospital Course: Ronald Dyer is an 82 year old male with history of advanced dementia, CKD stage IIIb, COPD, depression, type 2 diabetes, GERD, hypertension, hyperlipidemia, prior stroke, colon cancer status post right hemicolectomy who presented from home with complaint of confusion, generalized weakness.  CT head was negative for acute finding.  EKG showed sinus bradycardia .   Lab work showed creatinine of 1.7, vitamin B 12 level of 171.  Patient was admitted for the evaluation of altered mental status. MRI was then performed which was negative for acute findings.  Does show underlying remote lacunar infarct of left thalamus and small infarcts involving perivascular spaces and basal ganglia. Patient's wife reports that he has more "bad" days than "good" days.  Appetite also varies at home. He was admitted for further monitoring and evaluation in case of any other reversible etiologies causing change in mentation.  Interval History:  No events overnight.  Has become a little less interactive the past 2 days.  Saturday was his best day thus far.  Spoke with wife bedside this morning regarding that this looks like his underlying dementia which I explained is a progressive disease.  He has been eating very little while in the hospital as well which I also reassured her is typical with end-stage dementia and that he is not suffering.  She states she is not able to care for him at home any further in his current state and also agrees rehab may not help him much. They have hospice at home on board but she still doesn't think she can continue caring for him at home with hospice help and is interested in looking into alternative placement whether it's residential hospice vs alternative.   Assessment and Plan:  AMS -resolved Advanced  dementia -Patient brought in for worsening mentation although after monitoring in the hospital, this may be closer to his actual baseline with small variations as expected with advanced dementia -Infectious workup has been negative - B12 level was noted to be low which could possibly be contributing - He also had some mild dehydration from poor intake which again could also be contributing and cumulative effect - Hold off on any further workup for now as he appears to be at his mental baseline - Patient has also undergone workup with CT head and MRI brain -Mentation now back to baseline.  He is medically stable however dementia continues to play a large role in his overall quality of life and prognosis.  Wife no longer feels comfortable caring for patient at home and short-term rehab not likely to provide meaningful benefit.  Wife is interested in learning further about patient's eligibility for residential hospice or other options for placement   Sinus bradycardia - remained stable; okay to d/c tele   CKD stage IIIb - patient has history of CKD3b. Baseline creat ~ 1.5 - 1.7, eGFR~ 35 -Creatinine 1.7 on admission and he was possibly slightly hypovolemic/dehydrated appearing - s/p IVF - Encourage diet as able   Type 2 diabetes - A1c 6.4% on 11/16/22 - continue SSI and CBG monitoring    Hypertension: Takes hydrochlorothiazide, lisinopril at home.   - hold meds - continue IVF for now   History of COPD: Currently not in exacerbation.  Continue as needed bronchodilators   Thrombocytopenia: Mild.  Continue to monitor  History of stroke: On aspirin, Lipitor at home   History of BPH: On finasteride, tamsulosin   Old records reviewed in assessment of this patient  Antimicrobials:   DVT prophylaxis:  enoxaparin (LOVENOX) injection 40 mg Start: 11/16/22 1000   Code Status:   Code Status: DNR  Mobility Assessment (last 72 hours)     Mobility Assessment     Row Name 11/19/22 1911  11/19/22 1250 11/18/22 1937 11/17/22 2000     Does patient have an order for bedrest or is patient medically unstable No - Continue assessment -- No - Continue assessment No - Continue assessment    What is the highest level of mobility based on the progressive mobility assessment? Level 2 (Chairfast) - Balance while sitting on edge of bed and cannot stand Level 2 (Chairfast) - Balance while sitting on edge of bed and cannot stand Level 4 (Walks with assist in room) - Balance while marching in place and cannot step forward and back - Complete Level 4 (Walks with assist in room) - Balance while marching in place and cannot step forward and back - Complete             Barriers to discharge: none Disposition Plan:  TBD, now trying to pursue hospice placement if able  Status is: Inpt  Objective: Blood pressure (!) 124/56, pulse (!) 45, temperature 97.9 F (36.6 C), resp. rate 15, height 6' (1.829 m), weight 77 kg, SpO2 94 %.  Examination:  Physical Exam Constitutional:      Comments: Chronically ill-appearing elderly gentleman lying in bed in no distress with obvious underlying dementia  HENT:     Head: Normocephalic and atraumatic.     Mouth/Throat:     Mouth: Mucous membranes are moist.  Eyes:     Extraocular Movements: Extraocular movements intact.  Cardiovascular:     Rate and Rhythm: Normal rate and regular rhythm.  Pulmonary:     Effort: Pulmonary effort is normal. No respiratory distress.     Breath sounds: Normal breath sounds. No wheezing.  Abdominal:     General: Bowel sounds are normal. There is no distension.     Palpations: Abdomen is soft.     Tenderness: There is no abdominal tenderness.  Musculoskeletal:        General: Normal range of motion.     Cervical back: Normal range of motion and neck supple.  Skin:    General: Skin is warm and dry.  Neurological:     Comments: Follows commands and moves all 4 extremities; waxing/waning mentation      Consultants:     Procedures:    Data Reviewed: Results for orders placed or performed during the hospital encounter of 11/15/22 (from the past 24 hour(s))  Glucose, capillary     Status: Abnormal   Collection Time: 11/19/22  4:08 PM  Result Value Ref Range   Glucose-Capillary 112 (H) 70 - 99 mg/dL  Glucose, capillary     Status: Abnormal   Collection Time: 11/19/22  8:41 PM  Result Value Ref Range   Glucose-Capillary 154 (H) 70 - 99 mg/dL  Glucose, capillary     Status: Abnormal   Collection Time: 11/20/22  8:20 AM  Result Value Ref Range   Glucose-Capillary 131 (H) 70 - 99 mg/dL  Glucose, capillary     Status: Abnormal   Collection Time: 11/20/22 11:49 AM  Result Value Ref Range   Glucose-Capillary 123 (H) 70 - 99 mg/dL    I have reviewed  pertinent nursing notes, vitals, labs, and images as necessary. I have ordered labwork to follow up on as indicated.  I have reviewed the last notes from staff over past 24 hours. I have discussed patient's care plan and test results with nursing staff, CM/SW, and other staff as appropriate.    LOS: 3 days   Dwyane Dee, MD [facility] 11/20/2022, 1:08 PM

## 2022-11-20 NOTE — TOC Progression Note (Addendum)
Transition of Care [facility]) - Progression Note    Patient Details  Name: Ronald Dyer MRN: [id number] Date of Birth: [birth year] [facility]) CM/SW Contact  Lennart Pall, LCSW Phone Number: 11/20/2022, 1:58 PM  Clinical Narrative:     Alerted by MD today that pt may be more appropriate for residential hospice placement.  Pt is actively followed for Palliative care at home through [facility].  Have alerted Webb Silversmith with hospice of MD request and she is reviewing case.  ADDENDUM:  Pt not felt to meet criteria for residential hospice bed - will resume plan for SNF.   Expected Discharge Plan: [place] Barriers to Discharge: Continued Medical Work up, SNF Pending bed offer, Ship broker  Expected Discharge Plan and Services In-house Referral: Clinical Social Work   Post Acute Care Choice: [place] Living arrangements for the past 2 months: Single Family Home                 DME Arranged: N/A DME Agency: NA                   Social Determinants of Health (SDOH) Interventions SDOH Screenings   Food Insecurity: No Food Insecurity (11/16/2022)  Housing: Low Risk  (11/16/2022)  Transportation Needs: No Transportation Needs (11/16/2022)  Utilities: Not At Risk (11/16/2022)  Financial Resource Strain: Low Risk  (03/28/2019)  Physical Activity: Inactive (02/19/2019)  Tobacco Use: Medium Risk (11/17/2022)    Readmission Risk Interventions     No data to display

## 2022-11-21 DIAGNOSIS — F0150 Vascular dementia without behavioral disturbance: Secondary | ICD-10-CM | POA: Diagnosis not present

## 2022-11-21 DIAGNOSIS — R4182 Altered mental status, unspecified: Secondary | ICD-10-CM | POA: Diagnosis not present

## 2022-11-21 LAB — GLUCOSE, CAPILLARY
Glucose-Capillary: 110 mg/dL — ABNORMAL HIGH (ref 70–99)
Glucose-Capillary: 152 mg/dL — ABNORMAL HIGH (ref 70–99)
Glucose-Capillary: 111 mg/dL — ABNORMAL HIGH (ref 70–99)
Glucose-Capillary: 159 mg/dL — ABNORMAL HIGH (ref 70–99)

## 2022-11-21 NOTE — Progress Notes (Signed)
   11/21/22 1357  Assess: MEWS Score  Temp 97.7 F (36.5 C)  BP (!) 95/56  MAP (mmHg) 69  Pulse Rate (!) 47  Resp 18  Level of Consciousness Alert  SpO2 96 %  O2 Device Room Air  Assess: MEWS Score  MEWS Temp 0  MEWS Systolic 1  MEWS Pulse 1  MEWS RR 0  MEWS LOC 0  MEWS Score 2  MEWS Score Color Yellow  Assess: if the MEWS score is Yellow or Red  Were vital signs taken at a resting state? Yes  Focused Assessment No change from prior assessment  Does the patient meet 2 or more of the SIRS criteria? No  MEWS guidelines implemented  Yes, yellow  Treat  MEWS Interventions Considered administering scheduled or prn medications/treatments as ordered  Take Vital Signs  Increase Vital Sign Frequency  Yellow: Q2hr x1, continue Q4hrs until patient remains green for 12hrs  Escalate  MEWS: Escalate Yellow: Discuss with charge nurse and consider notifying provider and/or RRT  Notify: Charge Nurse/RN  Name of Charge Nurse/RN Notified Christa, RN  Provider Notification  Provider Name/Title Sabino Gasser, MD  Date Provider Notified 11/21/22  Time Provider Notified 1413  Method of Notification Page  Notification Reason Critical Result  Provider response No new orders  Date of Provider Response 11/21/22  Time of Provider Response 1435  Assess: SIRS CRITERIA  SIRS Temperature  0  SIRS Pulse 0  SIRS Respirations  0  SIRS WBC 0  SIRS Score Sum  0

## 2022-11-21 NOTE — Progress Notes (Signed)
MEWS Progress Note  Patient Details Name: Ronald Dyer MRN: [id number] DOB: 11-Aug-1941 Today's Date: 11/21/2022   MEWS Flowsheet Documentation:  Assess: MEWS Score Temp: 97.7 F (36.5 C) BP: (!) 95/56 MAP (mmHg): 69 Pulse Rate: (!) 47 ECG Heart Rate: (!) 57 Resp: 18 Level of Consciousness: Alert SpO2: 96 % O2 Device: Room Air Assess: MEWS Score MEWS Temp: 0 MEWS Systolic: 1 MEWS Pulse: 1 MEWS RR: 0 MEWS LOC: 0 MEWS Score: 2 MEWS Score Color: Yellow Assess: SIRS CRITERIA SIRS Temperature : 0 SIRS Respirations : 0 SIRS Pulse: 0 SIRS WBC: 0 SIRS Score Sum : 0 Assess: if the MEWS score is Yellow or Red Were vital signs taken at a resting state?: Yes Focused Assessment: No change from prior assessment Does the patient meet 2 or more of the SIRS criteria?: No MEWS guidelines implemented : Yes, yellow Treat MEWS Interventions: Considered administering scheduled or prn medications/treatments as ordered Take Vital Signs Increase Vital Sign Frequency : Yellow: Q2hr x1, continue Q4hrs until patient remains green for 12hrs Escalate MEWS: Escalate: Yellow: Discuss with charge nurse and consider notifying provider and/or RRT Notify: Charge Nurse/RN Name of Charge Nurse/RN Notified: Christa, RN Provider Notification Provider Name/Title: Sabino Gasser, MD Date Provider Notified: 11/21/22 Time Provider Notified: [id number] Method of Notification: Page Notification Reason: Critical Result Provider response: No new orders Date of Provider Response: 11/21/22 Time of Provider Response: [place] 11/21/2022, 2:40 PM

## 2022-11-21 NOTE — Progress Notes (Signed)
   11/21/22 1559  Assess: MEWS Score  Temp 97.9 F (36.6 C)  BP (!) 140/67  MAP (mmHg) 86  Pulse Rate (!) 50  Resp 18  Level of Consciousness Alert  SpO2 95 %  O2 Device Room Air  Assess: MEWS Score  MEWS Temp 0  MEWS Systolic 0  MEWS Pulse 1  MEWS RR 0  MEWS LOC 0  MEWS Score 1  MEWS Score Color Green  Assess: SIRS CRITERIA  SIRS Temperature  0  SIRS Pulse 0  SIRS Respirations  0  SIRS WBC 0  SIRS Score Sum  0

## 2022-11-21 NOTE — TOC Progression Note (Signed)
Transition of Care [facility]) - Progression Note    Patient Details  Name: SEVERUS AGUIAR MRN: [id number] Date of Birth: 17-May-1941  [facility] CM/SW [place], LCSW Phone Number: 11/21/2022, 11:29 AM  Clinical Narrative:    Met with pt's spouse at bedside to review bed offers. Pt's spouse accepted semi-private room at [facility]. Insurance [employer] has been requested and currently pending approval.    Expected Discharge Plan: Skilled Nursing Facility Barriers to Discharge: Continued Medical Work up, SNF Pending bed offer, Ship broker  Expected Discharge Plan and Services In-house Referral: Clinical Social Work   Post Acute Care Choice: [place] Living arrangements for the past 2 months: Single Family Home                 DME Arranged: N/A DME Agency: NA                   Social Determinants of Health (SDOH) Interventions SDOH Screenings   Food Insecurity: No Food Insecurity (11/16/2022)  Housing: Low Risk  (11/16/2022)  Transportation Needs: No Transportation Needs (11/16/2022)  Utilities: Not At Risk (11/16/2022)  Financial Resource Strain: Low Risk  (03/28/2019)  Physical Activity: Inactive (02/19/2019)  Tobacco Use: Medium Risk (11/17/2022)    Readmission Risk Interventions    11/21/2022   11:29 AM  Readmission Risk Prevention Plan  Post Dischage Appt Complete  Medication Screening Complete  Transportation Screening Complete

## 2022-11-21 NOTE — Progress Notes (Signed)
Progress Note    Ronald Dyer   [id number]  DOB: Jan 08, 1941  DOA: 11/15/2022     4 PCP: [name], MD  Initial CC: Altered mentation, weakness  Hospital Course: Ronald Dyer is an 82 year old male with history of advanced dementia, CKD stage IIIb, COPD, depression, type 2 diabetes, GERD, hypertension, hyperlipidemia, prior stroke, colon cancer status post right hemicolectomy who presented from home with complaint of confusion, generalized weakness.  CT head was negative for acute finding.  EKG showed sinus bradycardia .   Lab work showed creatinine of 1.7, vitamin B 12 level of 171.  Patient was admitted for the evaluation of altered mental status. MRI was then performed which was negative for acute findings.  Does show underlying remote lacunar infarct of left thalamus and small infarcts involving perivascular spaces and basal ganglia. Patient's wife reports that he has more "bad" days than "good" days.  Appetite also varies at home. He was admitted for further monitoring and evaluation in case of any other reversible etiologies causing change in mentation.  Interval History:  No events overnight.  Resting comfortably in bed.  Wife present bedside.  Still having waxing waning mentation as expected. Some hypotension noted this afternoon but MAP adequate.   Assessment and Plan:  AMS -resolved Advanced dementia -Patient brought in for worsening mentation although after monitoring in the hospital, this may be closer to his actual baseline with small variations as expected with advanced dementia -Infectious workup has been negative - B12 level was noted to be low which could possibly be contributing - He also had some mild dehydration from poor intake which again could also be contributing and cumulative effect - Hold off on any further workup for now as he appears to be at his mental baseline - Patient has also undergone workup with CT head and MRI brain -Mentation now back  to baseline.  He is medically stable however dementia continues to play a large role in his overall quality of life and prognosis.  Wife no longer feels comfortable caring for patient at home and short-term rehab not likely to provide meaningful benefit.  Wife is interested in learning further about patient's eligibility for residential hospice or other options for placement -Evaluated by hospice on 11/20/2022 and not a candidate for residential hospice.  Plan will continue to be pursuing rehab   Sinus bradycardia - remained stable; okay to d/c tele   CKD stage IIIb - patient has history of CKD3b. Baseline creat ~ 1.5 - 1.7, eGFR~ 35 -Creatinine 1.7 on admission and he was possibly slightly hypovolemic/dehydrated appearing - s/p IVF - Encourage diet as able   Type 2 diabetes - A1c 6.4% on 11/16/22 - continue SSI and CBG monitoring    Hypertension: Takes hydrochlorothiazide, lisinopril at home.   - hold meds - continue IVF for now   History of COPD: Currently not in exacerbation.  Continue as needed bronchodilators   Thrombocytopenia: Mild.  Continue to monitor   History of stroke: On aspirin, Lipitor at home   History of BPH: On finasteride, tamsulosin   Old records reviewed in assessment of this patient  Antimicrobials:   DVT prophylaxis:  enoxaparin (LOVENOX) injection 40 mg Start: 11/16/22 1000   Code Status:   Code Status: DNR  Mobility Assessment (last 72 hours)     Mobility Assessment     Row Name 11/20/22 1911 11/19/22 1911 11/19/22 1250 11/18/22 1937     Does patient have an order for bedrest or is patient  medically unstable No - Continue assessment No - Continue assessment -- No - Continue assessment    What is the highest level of mobility based on the progressive mobility assessment? Level 2 (Chairfast) - Balance while sitting on edge of bed and cannot stand Level 2 (Chairfast) - Balance while sitting on edge of bed and cannot stand Level 2 (Chairfast) - Balance  while sitting on edge of bed and cannot stand Level 4 (Walks with assist in room) - Balance while marching in place and cannot step forward and back - Complete             Barriers to discharge: none Disposition Plan:  SNF Status is: Inpt  Objective: Blood pressure (!) 140/67, pulse (!) 50, temperature 97.9 F (36.6 C), temperature source Oral, resp. rate 18, height 6' (1.829 m), weight 77 kg, SpO2 95 %.  Examination:  Physical Exam Constitutional:      Comments: Chronically ill-appearing elderly gentleman lying in bed in no distress with obvious underlying dementia  HENT:     Head: Normocephalic and atraumatic.     Mouth/Throat:     Mouth: Mucous membranes are moist.  Eyes:     Extraocular Movements: Extraocular movements intact.  Cardiovascular:     Rate and Rhythm: Normal rate and regular rhythm.  Pulmonary:     Effort: Pulmonary effort is normal. No respiratory distress.     Breath sounds: Normal breath sounds. No wheezing.  Abdominal:     General: Bowel sounds are normal. There is no distension.     Palpations: Abdomen is soft.     Tenderness: There is no abdominal tenderness.  Musculoskeletal:        General: Normal range of motion.     Cervical back: Normal range of motion and neck supple.  Skin:    General: Skin is warm and dry.  Neurological:     Comments: Follows commands and moves all 4 extremities; waxing/waning mentation      Consultants:    Procedures:    Data Reviewed: Results for orders placed or performed during the hospital encounter of 11/15/22 (from the past 24 hour(s))  Glucose, capillary     Status: Abnormal   Collection Time: 11/20/22  4:17 PM  Result Value Ref Range   Glucose-Capillary 115 (H) 70 - 99 mg/dL  Glucose, capillary     Status: Abnormal   Collection Time: 11/20/22  9:44 PM  Result Value Ref Range   Glucose-Capillary 146 (H) 70 - 99 mg/dL  Glucose, capillary     Status: Abnormal   Collection Time: 11/21/22  8:16 AM   Result Value Ref Range   Glucose-Capillary 110 (H) 70 - 99 mg/dL  Glucose, capillary     Status: Abnormal   Collection Time: 11/21/22 11:13 AM  Result Value Ref Range   Glucose-Capillary 152 (H) 70 - 99 mg/dL    I have reviewed pertinent nursing notes, vitals, labs, and images as necessary. I have ordered labwork to follow up on as indicated.  I have reviewed the last notes from staff over past 24 hours. I have discussed patient's care plan and test results with nursing staff, CM/SW, and other staff as appropriate.    LOS: 4 days   Dwyane Dee, MD [facility] 11/21/2022, 4:06 PM

## 2022-11-22 DIAGNOSIS — R630 Anorexia: Secondary | ICD-10-CM | POA: Diagnosis not present

## 2022-11-22 DIAGNOSIS — N183 Chronic kidney disease, stage 3 unspecified: Secondary | ICD-10-CM | POA: Diagnosis not present

## 2022-11-22 DIAGNOSIS — I150 Renovascular hypertension: Secondary | ICD-10-CM | POA: Diagnosis not present

## 2022-11-22 DIAGNOSIS — I6789 Other cerebrovascular disease: Secondary | ICD-10-CM | POA: Diagnosis not present

## 2022-11-22 DIAGNOSIS — N189 Chronic kidney disease, unspecified: Secondary | ICD-10-CM | POA: Diagnosis not present

## 2022-11-22 DIAGNOSIS — R2689 Other abnormalities of gait and mobility: Secondary | ICD-10-CM | POA: Diagnosis not present

## 2022-11-22 DIAGNOSIS — D649 Anemia, unspecified: Secondary | ICD-10-CM | POA: Diagnosis not present

## 2022-11-22 DIAGNOSIS — I10 Essential (primary) hypertension: Secondary | ICD-10-CM | POA: Diagnosis not present

## 2022-11-22 DIAGNOSIS — E119 Type 2 diabetes mellitus without complications: Secondary | ICD-10-CM | POA: Diagnosis not present

## 2022-11-22 DIAGNOSIS — N179 Acute kidney failure, unspecified: Secondary | ICD-10-CM | POA: Diagnosis not present

## 2022-11-22 DIAGNOSIS — E559 Vitamin D deficiency, unspecified: Secondary | ICD-10-CM | POA: Diagnosis not present

## 2022-11-22 DIAGNOSIS — J449 Chronic obstructive pulmonary disease, unspecified: Secondary | ICD-10-CM | POA: Diagnosis not present

## 2022-11-22 DIAGNOSIS — R262 Difficulty in walking, not elsewhere classified: Secondary | ICD-10-CM | POA: Diagnosis not present

## 2022-11-22 DIAGNOSIS — R001 Bradycardia, unspecified: Secondary | ICD-10-CM | POA: Diagnosis not present

## 2022-11-22 DIAGNOSIS — N400 Enlarged prostate without lower urinary tract symptoms: Secondary | ICD-10-CM | POA: Diagnosis not present

## 2022-11-22 DIAGNOSIS — Z8673 Personal history of transient ischemic attack (TIA), and cerebral infarction without residual deficits: Secondary | ICD-10-CM | POA: Diagnosis not present

## 2022-11-22 DIAGNOSIS — M6281 Muscle weakness (generalized): Secondary | ICD-10-CM | POA: Diagnosis not present

## 2022-11-22 DIAGNOSIS — M79673 Pain in unspecified foot: Secondary | ICD-10-CM | POA: Diagnosis not present

## 2022-11-22 DIAGNOSIS — R4182 Altered mental status, unspecified: Secondary | ICD-10-CM | POA: Diagnosis not present

## 2022-11-22 DIAGNOSIS — E782 Mixed hyperlipidemia: Secondary | ICD-10-CM | POA: Diagnosis not present

## 2022-11-22 DIAGNOSIS — M6259 Muscle wasting and atrophy, not elsewhere classified, multiple sites: Secondary | ICD-10-CM | POA: Diagnosis not present

## 2022-11-22 DIAGNOSIS — R1312 Dysphagia, oropharyngeal phase: Secondary | ICD-10-CM | POA: Diagnosis not present

## 2022-11-22 DIAGNOSIS — F03C0 Unspecified dementia, severe, without behavioral disturbance, psychotic disturbance, mood disturbance, and anxiety: Secondary | ICD-10-CM | POA: Diagnosis not present

## 2022-11-22 DIAGNOSIS — E1122 Type 2 diabetes mellitus with diabetic chronic kidney disease: Secondary | ICD-10-CM | POA: Diagnosis not present

## 2022-11-22 DIAGNOSIS — R41841 Cognitive communication deficit: Secondary | ICD-10-CM | POA: Diagnosis not present

## 2022-11-22 DIAGNOSIS — Z7401 Bed confinement status: Secondary | ICD-10-CM | POA: Diagnosis not present

## 2022-11-22 DIAGNOSIS — F32A Depression, unspecified: Secondary | ICD-10-CM | POA: Diagnosis not present

## 2022-11-22 DIAGNOSIS — I679 Cerebrovascular disease, unspecified: Secondary | ICD-10-CM | POA: Diagnosis not present

## 2022-11-22 DIAGNOSIS — R5381 Other malaise: Secondary | ICD-10-CM | POA: Diagnosis not present

## 2022-11-22 DIAGNOSIS — F0390 Unspecified dementia without behavioral disturbance: Secondary | ICD-10-CM | POA: Diagnosis not present

## 2022-11-22 DIAGNOSIS — R2681 Unsteadiness on feet: Secondary | ICD-10-CM | POA: Diagnosis not present

## 2022-11-22 LAB — GLUCOSE, CAPILLARY
Glucose-Capillary: 95 mg/dL (ref 70–99)
Glucose-Capillary: 150 mg/dL — ABNORMAL HIGH (ref 70–99)
Glucose-Capillary: 122 mg/dL — ABNORMAL HIGH (ref 70–99)

## 2022-11-22 NOTE — Progress Notes (Signed)
Physical Therapy Treatment Patient Details Name: Ronald Dyer MRN: [id number] DOB: [birth year] Today's Date: 11/22/2022   History of Present Illness Patient is a 82 year old male with history of advanced dementia, CKD stage IIIb, COPD, depression, type 2 diabetes, GERD, hypertension, hyperlipidemia, prior stroke, colon cancer status post right hemicolectomy who presented from home 11/15/22  with complaint of confusion, generalized weakness.  CT head was negative for acute finding.    PT Comments    Pt seen for PT tx with pt agreeable, wife present in room. Pt requires extra time & multimodal cuing to follow simple commands with extra time throughout session. Pt requires mod assist for supine>sit with use of hospital bed features, max assist for STS with HHA +1, but requires +2 HHA max assist to safely & successfully complete step pivot bed>recliner. Pt with decreased safety & overall awareness & appears anxious with mobility. At this time pt requires skilled +2 assist for safety with mobility.    Recommendations for follow up therapy are one component of a multi-disciplinary discharge planning process, led by the attending physician.  Recommendations may be updated based on patient status, additional functional criteria and insurance authorization.  Follow Up Recommendations  Can patient physically be transported by private vehicle: No    Assistance Recommended at Discharge Frequent or constant Supervision/Assistance  Patient can return home with the following Two people to help with walking and/or transfers;A lot of help with bathing/dressing/bathroom;Assistance with cooking/housework;Assist for transportation;Help with stairs or ramp for entrance   Equipment Recommendations  None recommended by PT    Recommendations for Other Services       Precautions / Restrictions Precautions Precautions: Fall Restrictions Weight Bearing Restrictions: No     Mobility  Bed Mobility Overal  bed mobility: Needs Assistance Bed Mobility: Supine to Sit     Supine to sit: Mod assist, HOB elevated     General bed mobility comments: assistance to move BLE to EOB, cuing to hold to PT"s hand to upright trunk with HOB elevated    Transfers Overall transfer level: Needs assistance Equipment used: 2 person hand held assist Transfers: Sit to/from Stand, Bed to chair/wheelchair/BSC Sit to Stand: Max assist   Step pivot transfers: Max assist, +2 safety/equipment       General transfer comment: STS from EOB with max assist +1, pt with decreased awareness & difficulty scooting to edge of bed for increased ease of movement, strongly leans posteriorly on EOB with BLE & maintains this position with decreased anterior pelvic shift & upright posture. Pt completes step pivot bed>recliner with BUE HHA max assist with extra time & cuing for sequencing/weight shifting & stepping.    Ambulation/Gait                   Stairs             Wheelchair Mobility    Modified Rankin (Stroke Patients Only)       Balance Overall balance assessment: Needs assistance Sitting-balance support: Bilateral upper extremity supported, Feet supported Sitting balance-Leahy Scale: Fair Sitting balance - Comments: close supervision static sitting   Standing balance support: Bilateral upper extremity supported, During functional activity, Reliant on assistive device for balance Standing balance-Leahy Scale: Poor                              Cognition Arousal/Alertness: Awake/alert Behavior During Therapy: Flat affect Overall Cognitive Status: History of cognitive impairments - at baseline  General Comments: Pt requires multimodal cuing to follow simple commands with extra time throughout session        Exercises      General Comments        Pertinent Vitals/Pain Pain Assessment Pain Assessment: Faces Faces Pain Scale:  No hurt    Home Living                          Prior Function            PT Goals (current goals can now be found in the care plan section) Acute Rehab PT Goals Patient Stated Goal: per wife, return home PT Goal Formulation: With family Time For Goal Achievement: 11/30/22 Progress towards PT goals: Progressing toward goals    Frequency    Min 2X/week      PT Plan Current plan remains appropriate    Co-evaluation              AM-PAC PT "6 Clicks" Mobility   Outcome Measure  Help needed turning from your back to your side while in a flat bed without using bedrails?: A Lot Help needed moving from lying on your back to sitting on the side of a flat bed without using bedrails?: Total Help needed moving to and from a bed to a chair (including a wheelchair)?: Total Help needed standing up from a chair using your arms (e.g., wheelchair or bedside chair)?: Total Help needed to walk in hospital room?: Total Help needed climbing 3-5 steps with a railing? : Total 6 Click Score: 7    End of Session   Activity Tolerance: Patient tolerated treatment well Patient left: in chair;with chair alarm set;with call bell/phone within reach;with family/visitor present         Time: [id number] PT Time Calculation (min) (ACUTE ONLY): 11 min  Charges:  $Therapeutic Activity: 8-22 mins                     Ronald Dyer, PT, DPT 11/22/22, 9:40 AM   Ronald Dyer 11/22/2022, 9:38 AM

## 2022-11-22 NOTE — Care Management Important Message (Signed)
Important Message  Patient Details IM Letter given. Name: Ronald Dyer MRN: [id number] Date of Birth: 1941-07-23   Medicare Important Message Given:  Yes     Kerin Salen 11/22/2022, 10:44 AM

## 2022-11-22 NOTE — Progress Notes (Signed)
Report called to [facility] staff.  IV removed and pt awaiting transport.

## 2022-11-22 NOTE — TOC Transition Note (Signed)
Transition of Care [facility]) - CM/SW Discharge Note   Patient Details  Name: Ronald Dyer MRN: [id number] Date of Birth: August 29, 1940  [facility] CM/SW Contact:  Vassie Moselle, LCSW Phone Number: 11/22/2022, 3:24 PM   Clinical Narrative:    Pt's insurance auth approved. Pt to transfer to [facility]. Pt will be going to room 905b. RN to call report to [phone]. PTAR called for transportation.    Final next level of care: Skilled Nursing Facility Barriers to Discharge: Barriers Resolved   Patient Goals and CMS Choice CMS Medicare.gov Compare Post Acute Care list provided to:: Patient Represenative (must comment) Choice offered to / list presented to : Spouse  Discharge Placement     Existing PASRR number confirmed : 11/17/22          Patient chooses bed at: [facility] Patient to be transferred to facility by: [place] Name of family member notified: Delsa Grana Patient and family notified of of transfer: 11/22/22  Discharge Plan and Services Additional resources added to the After Visit Summary for   In-house Referral: Clinical Social Work   Post Acute Care Choice: [place]          DME Arranged: N/A DME Agency: NA                  Social Determinants of Health ([place]) Interventions SDOH Screenings   Food Insecurity: No Food Insecurity (11/16/2022)  Housing: Low Risk  (11/16/2022)  Transportation Needs: No Transportation Needs (11/16/2022)  Utilities: Not At Risk (11/16/2022)  Financial Resource Strain: Low Risk  (03/28/2019)  Physical Activity: Inactive (02/19/2019)  Tobacco Use: Medium Risk (11/17/2022)     Readmission Risk Interventions    11/21/2022   11:29 AM  Readmission Risk Prevention Plan  Post Dischage Appt Complete  Medication Screening Complete  Transportation Screening Complete

## 2022-11-22 NOTE — TOC Progression Note (Signed)
Transition of Care [facility]) - Progression Note    Patient Details  Name: Ronald Dyer MRN: [id number] Date of Birth: [birth year]  Transition of Care [facility]) CM/SW [place], [place] Phone Number: 11/22/2022, 11:13 AM  Clinical Narrative:    CSW faxed additional documents to Bernadene Bell to review for insurance auth,  Auth currently still under review,    Expected Discharge Plan: [place] Barriers to Discharge: Continued Medical Work up, SNF Pending bed offer, Ship broker  Expected Discharge Plan and Services In-house Referral: Clinical Social Work   Post Acute Care Choice: [place] Living arrangements for the past 2 months: Single Family Home                 DME Arranged: N/A DME Agency: NA                   Social Determinants of Health (SDOH) Interventions SDOH Screenings   Food Insecurity: No Food Insecurity (11/16/2022)  Housing: Low Risk  (11/16/2022)  Transportation Needs: No Transportation Needs (11/16/2022)  Utilities: Not At Risk (11/16/2022)  Financial Resource Strain: Low Risk  (03/28/2019)  Physical Activity: Inactive (02/19/2019)  Tobacco Use: Medium Risk (11/17/2022)    Readmission Risk Interventions    11/21/2022   11:29 AM  Readmission Risk Prevention Plan  Post Dischage Appt Complete  Medication Screening Complete  Transportation Screening Complete

## 2022-11-22 NOTE — Discharge Summary (Signed)
Physician Discharge Summary  [place] [id number] DOB: Aug 18, 1941 DOA: 11/15/2022  PCP: [name], MD  Admit date: 11/15/2022 Discharge date: 11/22/2022 30 Day Unplanned Readmission Risk Score    Flowsheet Row ED to Hosp-Admission (Current) from 11/15/2022 in [facility]  30 Day Unplanned Readmission Risk Score (%) 14.62 Filed at 11/22/2022 0801       This score is the patient's risk of an unplanned readmission within 30 days of being discharged (0 -100%). The score is based on dignosis, age, lab data, medications, orders, and past utilization.   Low:  0-14.9   Medium: 15-21.9   High: 22-29.9   Extreme: 30 and above          Admitted From: Home Disposition: SNF  Recommendations for Outpatient Follow-up:  Follow up with PCP in 1-2 weeks Please obtain BMP/CBC in one week Please follow up with your PCP on the following pending results: Unresulted Labs (From admission, onward)    None         Home Health: None Equipment/Devices: None  Discharge Condition: Stable CODE STATUS: DNR Diet recommendation: Cardiac  Subjective: Seen and examined.  No complaints.  Fully alert and oriented.  Brief/Interim Summary: Ronald Dyer is an 82 year old male with history of advanced dementia, CKD stage IIIb, COPD, depression, type 2 diabetes, GERD, hypertension, hyperlipidemia, prior stroke, colon cancer status post right hemicolectomy who presented from home with complaint of confusion, generalized weakness.  CT head was negative for acute finding.  EKG showed sinus bradycardia Lab work showed creatinine of 1.7, vitamin B 12 level of 171.  Patient was admitted for the evaluation of altered mental status. MRI negative for acute findings.  Does show underlying remote lacunar infarct of left thalamus and small infarcts involving perivascular spaces and basal ganglia. He was admitted for further monitoring and evaluation for altered mental  status and possible advanced dementia. Infectious workup has been negative - B12 level was noted to be low which could possibly be contributing - He also had some mild dehydration from poor intake which again could also be contributing and cumulative effect -Mentation now back to baseline.  He is medically stable however dementia continues to play a large role in his overall quality of life and prognosis.  Wife no longer feels comfortable caring for patient at home and short-term rehab not likely to provide meaningful benefit.  -Evaluated by hospice on 11/20/2022 and not a candidate for residential hospice.  Plan to discharge him to rehab today.   Sinus bradycardia - remained stable; okay to d/c tele   CKD stage IIIb - patient has history of CKD3b. Baseline creat ~ 1.5 - 1.7, eGFR~ 35 -Creatinine 1.7 on admission and he was possibly slightly hypovolemic/dehydrated appearing.  Received IV fluids.   Type 2 diabetes - A1c 6.4% on 11/16/22 - continue SSI and CBG monitoring.  Not on any home medications.   Hypertension: Resume home medication.   History of COPD: Currently not in exacerbation.  Continue as needed bronchodilators   Thrombocytopenia: Mild.  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   atorvastatin 10 MG tablet Commonly known as: LIPITOR Take 10 mg by mouth See admin instructions. Take 10 mg by mouth on Tuesday(s) and Thursday(s)   donepezil 10 MG tablet Commonly known as: ARICEPT TAKE ONE TABLET BY MOUTH EVERYDAY AT BEDTIME Needs appointment for further refills   finasteride 5 MG tablet Commonly known as: PROSCAR Take 5 mg by mouth daily.   hydrochlorothiazide 25 MG tablet Commonly known as: HYDRODIURIL Take 25 mg by mouth every morning.   Jardiance 25 MG Tabs tablet Generic drug: empagliflozin Take 25 mg by mouth daily.   lisinopril 20 MG tablet Commonly known as: ZESTRIL Take 20 mg by mouth daily.   multivitamin with minerals tablet Take 1 tablet by mouth daily.   pantoprazole 40 MG tablet Commonly known as: PROTONIX Take 1 tablet (40 mg total) by mouth daily at 6 (six) AM.   pioglitazone 45 MG tablet Commonly known as: ACTOS Take 45 mg by mouth daily.   sertraline 100 MG tablet Commonly known as: ZOLOFT Take 100 mg by mouth daily.   tamsulosin 0.4 MG Caps capsule Commonly known as: FLOMAX Take 0.4 mg by mouth daily.   vitamin B-12 100 MCG tablet Commonly known as: CYANOCOBALAMIN Take 100 mcg by mouth daily.        Follow-up Information     [name], MD Follow up in 1 week(s).   Specialty: Family Medicine Contact information: [place] [address] [phone]                Allergies  Allergen Reactions   Metformin Hcl Diarrhea   Simvastatin Other (See Comments)    High CPK     Consultations: None   Procedures/Studies: MR BRAIN WO CONTRAST  Result Date: 11/16/2022 CLINICAL DATA:  Altered mental status, confusion EXAM: MRI HEAD WITHOUT CONTRAST TECHNIQUE: Multiplanar, multiecho pulse sequences of the brain and surrounding structures were obtained without intravenous contrast. COMPARISON:  CT head 1 day prior, MR head 07/04/2020 FINDINGS: Brain: There is no acute  intracranial hemorrhage, extra-axial fluid collection, or acute infarct There is background parenchymal volume loss with prominence of the ventricular system and extra-axial CSF spaces, unchanged. Confluent FLAIR signal abnormality throughout the supratentorial white matter likely reflects sequela of advanced chronic small-vessel ischemic change. There is a small remote lacunar infarct in the left thalamus and additional small infarcts versus perivascular spaces in the basal ganglia. The pituitary and suprasellar region are normal. There is no mass lesion. There is no mass effect or midline shift. Vascular: The right V4 segment is abnormal, either highly stenotic or occluded, similar to the brain MRI from 2021. The major flow voids are otherwise normal. Skull and upper cervical spine: Normal marrow signal. Sinuses/Orbits: The paranasal sinuses are clear. Bilateral lens implants are in place. The globes and orbits are otherwise unremarkable. Other: None. IMPRESSION: No acute intracranial pathology. Electronically Signed   By: Valetta Mole M.D.   On: 11/16/2022 08:31   CT Head Wo Contrast  Result Date: 11/15/2022 CLINICAL  DATA:  Altered mental status EXAM: CT HEAD WITHOUT CONTRAST TECHNIQUE: Contiguous axial images were obtained from the base of the skull through the vertex without intravenous contrast. RADIATION DOSE REDUCTION: This exam was performed according to the departmental dose-optimization program which includes automated exposure control, adjustment of the mA and/or kV according to patient size and/or use of iterative reconstruction technique. COMPARISON:  10/29/2021 FINDINGS: Brain: No evidence of acute infarction, hemorrhage, hydrocephalus, extra-axial collection or mass lesion/mass effect. Chronic atrophic and ischemic changes are noted. Vascular: No hyperdense vessel or unexpected calcification. Skull: Normal. Negative for fracture or focal lesion. Sinuses/Orbits: No acute finding. Other: None.  IMPRESSION: Chronic atrophic and ischemic changes without acute intracranial abnormality. Electronically Signed   By: Inez Catalina M.D.   On: 11/15/2022 20:12   DG Chest 2 View  Result Date: 11/15/2022 CLINICAL DATA:  Altered mental status EXAM: CHEST - 2 VIEW COMPARISON:  Chest x-ray 09/01/2021 FINDINGS: The heart size and mediastinal contours are within normal limits. Both lungs are clear. The visualized skeletal structures are unremarkable. IMPRESSION: No active cardiopulmonary disease. Electronically Signed   By: Ronney Asters M.D.   On: 11/15/2022 19:57     Discharge Exam: Vitals:   11/22/22 0454 11/22/22 1330  BP: (!) 121/58 (!) 101/57  Pulse: (!) 50 65  Resp: 17 18  Temp: 98.1 F (36.7 C) 97.7 F (36.5 C)  SpO2: 97% 99%   Vitals:   11/21/22 1559 11/21/22 2122 11/22/22 0454 11/22/22 1330  BP: (!) 140/67 (!) 117/50 (!) 121/58 (!) 101/57  Pulse: (!) 50 (!) 48 (!) 50 65  Resp: 18 17 17 18   Temp: 97.9 F (36.6 C) 98.4 F (36.9 C) 98.1 F (36.7 C) 97.7 F (36.5 C)  TempSrc: Oral Oral Oral Oral  SpO2: 95% 100% 97% 99%  Weight:      Height:        General: Pt is alert, awake, not in acute distress Cardiovascular: RRR, S1/S2 +, no rubs, no gallops Respiratory: CTA bilaterally, no wheezing, no rhonchi Abdominal: Soft, NT, ND, bowel sounds + Extremities: no edema, no cyanosis    The results of significant diagnostics from this hospitalization (including imaging, microbiology, ancillary and laboratory) are listed below for reference.     Microbiology: Recent Results (from the past 240 hour(s))  Resp panel by RT-PCR (RSV, Flu A&B, Covid) Anterior Nasal Swab     Status: None   Collection Time: 11/15/22  7:41 PM   Specimen: Anterior Nasal Swab  Result Value Ref Range Status   SARS Coronavirus 2 by RT PCR NEGATIVE NEGATIVE Final    Comment: (NOTE) SARS-CoV-2 target nucleic acids are NOT DETECTED.  The SARS-CoV-2 RNA is generally detectable in upper respiratory specimens  during the acute phase of infection. The lowest concentration of SARS-CoV-2 viral copies this assay can detect is 138 copies/mL. A negative result does not preclude SARS-Cov-2 infection and should not be used as the sole basis for treatment or other patient management decisions. A negative result may occur with  improper specimen collection/handling, submission of specimen other than nasopharyngeal swab, presence of viral mutation(s) within the areas targeted by this assay, and inadequate number of viral copies(<138 copies/mL). A negative result must be combined with clinical observations, patient history, and epidemiological information. The expected result is Negative.  Fact Sheet for Patients:  EntrepreneurPulse.com.au  Fact Sheet for Healthcare Providers:  IncredibleEmployment.be  This test is no t yet approved or cleared by the [place] and  has been authorized  for detection and/or diagnosis of SARS-CoV-2 by FDA under an Emergency Use Authorization (EUA). This EUA will remain  in effect (meaning this test can be used) for the duration of the COVID-19 declaration under Section 564(b)(1) of the Act, 21 U.S.C.section 360bbb-3(b)(1), unless the authorization is terminated  or revoked sooner.       Influenza A by PCR NEGATIVE NEGATIVE Final   Influenza B by PCR NEGATIVE NEGATIVE Final    Comment: (NOTE) The Xpert Xpress SARS-CoV-2/FLU/RSV plus assay is intended as an aid in the diagnosis of influenza from Nasopharyngeal swab specimens and should not be used as a sole basis for treatment. Nasal washings and aspirates are unacceptable for Xpert Xpress SARS-CoV-2/FLU/RSV testing.  Fact Sheet for Patients: EntrepreneurPulse.com.au  Fact Sheet for Healthcare Providers: IncredibleEmployment.be  This test is not yet approved or cleared by the [place] FDA and has been authorized for detection  and/or diagnosis of SARS-CoV-2 by FDA under an Emergency Use Authorization (EUA). This EUA will remain in effect (meaning this test can be used) for the duration of the COVID-19 declaration under Section 564(b)(1) of the Act, 21 U.S.C. section 360bbb-3(b)(1), unless the authorization is terminated or revoked.     Resp Syncytial Virus by PCR NEGATIVE NEGATIVE Final    Comment: (NOTE) Fact Sheet for Patients: EntrepreneurPulse.com.au  Fact Sheet for Healthcare Providers: IncredibleEmployment.be  This test is not yet approved or cleared by the [place] FDA and has been authorized for detection and/or diagnosis of SARS-CoV-2 by FDA under an Emergency Use Authorization (EUA). This EUA will remain in effect (meaning this test can be used) for the duration of the COVID-19 declaration under Section 564(b)(1) of the Act, 21 U.S.C. section 360bbb-3(b)(1), unless the authorization is terminated or revoked.  Performed at [facility], [place] [address] [place], [place] [address]      Labs: BNP (last 3 results) No results for input(s): "BNP" in the last 8760 hours. Basic Metabolic Panel: Recent Labs  Lab 11/15/22 1934 11/16/22 0426 11/17/22 0522  NA 139 139 134*  K 3.8 3.6 3.4*  CL 106 106 103  CO2 26 26 23   GLUCOSE 123* 118* 93  BUN 24* 23 20  CREATININE 1.70* 1.68* 1.51*  CALCIUM 9.0 9.0 8.8*   Liver Function Tests: Recent Labs  Lab 11/15/22 1934  AST 15  ALT 13  ALKPHOS 75  BILITOT 0.6  PROT 7.2  ALBUMIN 3.8   No results for input(s): "LIPASE", "AMYLASE" in the last 168 hours. Recent Labs  Lab 11/16/22 0107  AMMONIA 17   CBC: Recent Labs  Lab 11/15/22 1934 11/16/22 0426  WBC 6.1 6.9  NEUTROABS 3.7  --   HGB 12.6* 12.1*  HCT 39.2 37.0*  MCV 82.7 82.0  PLT 148* 142*   Cardiac Enzymes: Recent Labs  Lab 11/15/22 1934  CKTOTAL 92   BNP: Invalid input(s): "POCBNP" CBG: Recent Labs  Lab  11/21/22 1113 11/21/22 1631 11/21/22 2124 11/22/22 0732 11/22/22 1131  GLUCAP 152* 111* 159* 95 150*   D-Dimer No results for input(s): "DDIMER" in the last 72 hours. Hgb A1c No results for input(s): "HGBA1C" in the last 72 hours. Lipid Profile No results for input(s): "CHOL", "HDL", "LDLCALC", "TRIG", "CHOLHDL", "LDLDIRECT" in the last 72 hours. Thyroid function studies No results for input(s): "TSH", "T4TOTAL", "T3FREE", "THYROIDAB" in the last 72 hours.  Invalid input(s): "FREET3" Anemia work up No results for input(s): "VITAMINB12", "FOLATE", "FERRITIN", "TIBC", "IRON", "RETICCTPCT" in the last 72 hours. Urinalysis  Component Value Date/Time   COLORURINE STRAW (A) 11/15/2022 2317   APPEARANCEUR CLEAR 11/15/2022 2317   LABSPEC 1.011 11/15/2022 2317   PHURINE 6.0 11/15/2022 2317   GLUCOSEU >=500 (A) 11/15/2022 2317   HGBUR NEGATIVE 11/15/2022 2317   BILIRUBINUR NEGATIVE 11/15/2022 2317   KETONESUR NEGATIVE 11/15/2022 2317   PROTEINUR NEGATIVE 11/15/2022 2317   NITRITE NEGATIVE 11/15/2022 2317   LEUKOCYTESUR NEGATIVE 11/15/2022 2317   Sepsis Labs Recent Labs  Lab 11/15/22 1934 11/16/22 0426  WBC 6.1 6.9   Microbiology Recent Results (from the past 240 hour(s))  Resp panel by RT-PCR (RSV, Flu A&B, Covid) Anterior Nasal Swab     Status: None   Collection Time: 11/15/22  7:41 PM   Specimen: Anterior Nasal Swab  Result Value Ref Range Status   SARS Coronavirus 2 by RT PCR NEGATIVE NEGATIVE Final    Comment: (NOTE) SARS-CoV-2 target nucleic acids are NOT DETECTED.  The SARS-CoV-2 RNA is generally detectable in upper respiratory specimens during the acute phase of infection. The lowest concentration of SARS-CoV-2 viral copies this assay can detect is 138 copies/mL. A negative result does not preclude SARS-Cov-2 infection and should not be used as the sole basis for treatment or other patient management decisions. A negative result may occur with  improper  specimen collection/handling, submission of specimen other than nasopharyngeal swab, presence of viral mutation(s) within the areas targeted by this assay, and inadequate number of viral copies(<138 copies/mL). A negative result must be combined with clinical observations, patient history, and epidemiological information. The expected result is Negative.  Fact Sheet for Patients:  EntrepreneurPulse.com.au  Fact Sheet for Healthcare Providers:  IncredibleEmployment.be  This test is no t yet approved or cleared by the [place] FDA and  has been authorized for detection and/or diagnosis of SARS-CoV-2 by FDA under an Emergency Use Authorization (EUA). This EUA will remain  in effect (meaning this test can be used) for the duration of the COVID-19 declaration under Section 564(b)(1) of the Act, 21 U.S.C.section 360bbb-3(b)(1), unless the authorization is terminated  or revoked sooner.       Influenza A by PCR NEGATIVE NEGATIVE Final   Influenza B by PCR NEGATIVE NEGATIVE Final    Comment: (NOTE) The Xpert Xpress SARS-CoV-2/FLU/RSV plus assay is intended as an aid in the diagnosis of influenza from Nasopharyngeal swab specimens and should not be used as a sole basis for treatment. Nasal washings and aspirates are unacceptable for Xpert Xpress SARS-CoV-2/FLU/RSV testing.  Fact Sheet for Patients: EntrepreneurPulse.com.au  Fact Sheet for Healthcare Providers: IncredibleEmployment.be  This test is not yet approved or cleared by the [place] FDA and has been authorized for detection and/or diagnosis of SARS-CoV-2 by FDA under an Emergency Use Authorization (EUA). This EUA will remain in effect (meaning this test can be used) for the duration of the COVID-19 declaration under Section 564(b)(1) of the Act, 21 U.S.C. section 360bbb-3(b)(1), unless the authorization is terminated or revoked.     Resp  Syncytial Virus by PCR NEGATIVE NEGATIVE Final    Comment: (NOTE) Fact Sheet for Patients: EntrepreneurPulse.com.au  Fact Sheet for Healthcare Providers: IncredibleEmployment.be  This test is not yet approved or cleared by the [place] FDA and has been authorized for detection and/or diagnosis of SARS-CoV-2 by FDA under an Emergency Use Authorization (EUA). This EUA will remain in effect (meaning this test can be used) for the duration of the COVID-19 declaration under Section 564(b)(1) of the Act, 21 U.S.C. section 360bbb-3(b)(1), unless the authorization is  terminated or revoked.  Performed at [facility], [place] [address], [place], [place] [address]      Time coordinating discharge: Over 30 minutes  SIGNED:   Darliss Cheney, MD  [facility] 11/22/2022, 2:15 PM *Please note that this is a verbal dictation therefore any spelling or grammatical errors are due to the "[place] [employer]" system interpretation. If 7PM-7AM, please contact night-coverage [email or web address]

## 2022-11-23 ENCOUNTER — Telehealth: Payer: Self-pay | Admitting: Adult Health

## 2022-11-24 DIAGNOSIS — N400 Enlarged prostate without lower urinary tract symptoms: Secondary | ICD-10-CM | POA: Diagnosis not present

## 2022-11-24 DIAGNOSIS — R5381 Other malaise: Secondary | ICD-10-CM | POA: Diagnosis not present

## 2022-11-24 DIAGNOSIS — N183 Chronic kidney disease, stage 3 unspecified: Secondary | ICD-10-CM | POA: Diagnosis not present

## 2022-11-24 DIAGNOSIS — E1122 Type 2 diabetes mellitus with diabetic chronic kidney disease: Secondary | ICD-10-CM | POA: Diagnosis not present

## 2022-11-24 DIAGNOSIS — M79673 Pain in unspecified foot: Secondary | ICD-10-CM | POA: Diagnosis not present

## 2022-11-24 DIAGNOSIS — I150 Renovascular hypertension: Secondary | ICD-10-CM | POA: Diagnosis not present

## 2022-11-24 DIAGNOSIS — F03C0 Unspecified dementia, severe, without behavioral disturbance, psychotic disturbance, mood disturbance, and anxiety: Secondary | ICD-10-CM | POA: Diagnosis not present

## 2022-11-24 DIAGNOSIS — J449 Chronic obstructive pulmonary disease, unspecified: Secondary | ICD-10-CM | POA: Diagnosis not present

## 2022-11-24 DIAGNOSIS — N179 Acute kidney failure, unspecified: Secondary | ICD-10-CM | POA: Diagnosis not present

## 2022-11-27 DIAGNOSIS — F32A Depression, unspecified: Secondary | ICD-10-CM | POA: Diagnosis not present

## 2022-11-27 DIAGNOSIS — R5381 Other malaise: Secondary | ICD-10-CM | POA: Diagnosis not present

## 2022-11-27 DIAGNOSIS — R41841 Cognitive communication deficit: Secondary | ICD-10-CM | POA: Diagnosis not present

## 2022-11-27 DIAGNOSIS — F03C0 Unspecified dementia, severe, without behavioral disturbance, psychotic disturbance, mood disturbance, and anxiety: Secondary | ICD-10-CM | POA: Diagnosis not present

## 2022-11-27 DIAGNOSIS — R2681 Unsteadiness on feet: Secondary | ICD-10-CM | POA: Diagnosis not present

## 2022-11-27 DIAGNOSIS — R1312 Dysphagia, oropharyngeal phase: Secondary | ICD-10-CM | POA: Diagnosis not present

## 2022-11-27 DIAGNOSIS — F0390 Unspecified dementia without behavioral disturbance: Secondary | ICD-10-CM | POA: Diagnosis not present

## 2022-11-27 DIAGNOSIS — I6789 Other cerebrovascular disease: Secondary | ICD-10-CM | POA: Diagnosis not present

## 2022-11-27 DIAGNOSIS — M6281 Muscle weakness (generalized): Secondary | ICD-10-CM | POA: Diagnosis not present

## 2022-11-29 DIAGNOSIS — R2681 Unsteadiness on feet: Secondary | ICD-10-CM | POA: Diagnosis not present

## 2022-11-29 DIAGNOSIS — F0390 Unspecified dementia without behavioral disturbance: Secondary | ICD-10-CM | POA: Diagnosis not present

## 2022-11-29 DIAGNOSIS — R1312 Dysphagia, oropharyngeal phase: Secondary | ICD-10-CM | POA: Diagnosis not present

## 2022-11-29 DIAGNOSIS — I6789 Other cerebrovascular disease: Secondary | ICD-10-CM | POA: Diagnosis not present

## 2022-11-29 DIAGNOSIS — M6281 Muscle weakness (generalized): Secondary | ICD-10-CM | POA: Diagnosis not present

## 2022-11-29 DIAGNOSIS — R41841 Cognitive communication deficit: Secondary | ICD-10-CM | POA: Diagnosis not present

## 2022-11-29 DIAGNOSIS — R5381 Other malaise: Secondary | ICD-10-CM | POA: Diagnosis not present

## 2022-11-29 NOTE — Telephone Encounter (Signed)
Called pt. LVM to please call office.  

## 2022-12-04 DIAGNOSIS — R5381 Other malaise: Secondary | ICD-10-CM | POA: Diagnosis not present

## 2022-12-04 DIAGNOSIS — R1312 Dysphagia, oropharyngeal phase: Secondary | ICD-10-CM | POA: Diagnosis not present

## 2022-12-04 DIAGNOSIS — F0390 Unspecified dementia without behavioral disturbance: Secondary | ICD-10-CM | POA: Diagnosis not present

## 2022-12-04 DIAGNOSIS — M6281 Muscle weakness (generalized): Secondary | ICD-10-CM | POA: Diagnosis not present

## 2022-12-04 DIAGNOSIS — I6789 Other cerebrovascular disease: Secondary | ICD-10-CM | POA: Diagnosis not present

## 2022-12-04 DIAGNOSIS — R41841 Cognitive communication deficit: Secondary | ICD-10-CM | POA: Diagnosis not present

## 2022-12-04 DIAGNOSIS — R2681 Unsteadiness on feet: Secondary | ICD-10-CM | POA: Diagnosis not present

## 2022-12-05 DIAGNOSIS — N189 Chronic kidney disease, unspecified: Secondary | ICD-10-CM | POA: Diagnosis not present

## 2022-12-05 DIAGNOSIS — F03C0 Unspecified dementia, severe, without behavioral disturbance, psychotic disturbance, mood disturbance, and anxiety: Secondary | ICD-10-CM | POA: Diagnosis not present

## 2022-12-05 DIAGNOSIS — I679 Cerebrovascular disease, unspecified: Secondary | ICD-10-CM | POA: Diagnosis not present

## 2022-12-05 DIAGNOSIS — E1122 Type 2 diabetes mellitus with diabetic chronic kidney disease: Secondary | ICD-10-CM | POA: Diagnosis not present

## 2022-12-05 DIAGNOSIS — R630 Anorexia: Secondary | ICD-10-CM | POA: Diagnosis not present

## 2022-12-05 DIAGNOSIS — R001 Bradycardia, unspecified: Secondary | ICD-10-CM | POA: Diagnosis not present

## 2022-12-05 DIAGNOSIS — I10 Essential (primary) hypertension: Secondary | ICD-10-CM | POA: Diagnosis not present

## 2022-12-06 DIAGNOSIS — R2681 Unsteadiness on feet: Secondary | ICD-10-CM | POA: Diagnosis not present

## 2022-12-06 DIAGNOSIS — F0390 Unspecified dementia without behavioral disturbance: Secondary | ICD-10-CM | POA: Diagnosis not present

## 2022-12-06 DIAGNOSIS — M6281 Muscle weakness (generalized): Secondary | ICD-10-CM | POA: Diagnosis not present

## 2022-12-06 DIAGNOSIS — R1312 Dysphagia, oropharyngeal phase: Secondary | ICD-10-CM | POA: Diagnosis not present

## 2022-12-06 DIAGNOSIS — R41841 Cognitive communication deficit: Secondary | ICD-10-CM | POA: Diagnosis not present

## 2022-12-06 DIAGNOSIS — I6789 Other cerebrovascular disease: Secondary | ICD-10-CM | POA: Diagnosis not present

## 2022-12-06 DIAGNOSIS — R5381 Other malaise: Secondary | ICD-10-CM | POA: Diagnosis not present

## 2022-12-11 DIAGNOSIS — R2681 Unsteadiness on feet: Secondary | ICD-10-CM | POA: Diagnosis not present

## 2022-12-11 DIAGNOSIS — R5381 Other malaise: Secondary | ICD-10-CM | POA: Diagnosis not present

## 2022-12-11 DIAGNOSIS — R41841 Cognitive communication deficit: Secondary | ICD-10-CM | POA: Diagnosis not present

## 2022-12-11 DIAGNOSIS — R1312 Dysphagia, oropharyngeal phase: Secondary | ICD-10-CM | POA: Diagnosis not present

## 2022-12-11 DIAGNOSIS — I6789 Other cerebrovascular disease: Secondary | ICD-10-CM | POA: Diagnosis not present

## 2022-12-11 DIAGNOSIS — M6281 Muscle weakness (generalized): Secondary | ICD-10-CM | POA: Diagnosis not present

## 2022-12-11 DIAGNOSIS — F0390 Unspecified dementia without behavioral disturbance: Secondary | ICD-10-CM | POA: Diagnosis not present

## 2022-12-13 DIAGNOSIS — R1312 Dysphagia, oropharyngeal phase: Secondary | ICD-10-CM | POA: Diagnosis not present

## 2022-12-13 DIAGNOSIS — M6281 Muscle weakness (generalized): Secondary | ICD-10-CM | POA: Diagnosis not present

## 2022-12-13 DIAGNOSIS — R2681 Unsteadiness on feet: Secondary | ICD-10-CM | POA: Diagnosis not present

## 2022-12-13 DIAGNOSIS — R5381 Other malaise: Secondary | ICD-10-CM | POA: Diagnosis not present

## 2022-12-13 DIAGNOSIS — I6789 Other cerebrovascular disease: Secondary | ICD-10-CM | POA: Diagnosis not present

## 2022-12-13 DIAGNOSIS — R41841 Cognitive communication deficit: Secondary | ICD-10-CM | POA: Diagnosis not present

## 2022-12-13 DIAGNOSIS — F0390 Unspecified dementia without behavioral disturbance: Secondary | ICD-10-CM | POA: Diagnosis not present

## 2022-12-18 DIAGNOSIS — F0390 Unspecified dementia without behavioral disturbance: Secondary | ICD-10-CM | POA: Diagnosis not present

## 2022-12-18 DIAGNOSIS — I6789 Other cerebrovascular disease: Secondary | ICD-10-CM | POA: Diagnosis not present

## 2022-12-18 DIAGNOSIS — M6281 Muscle weakness (generalized): Secondary | ICD-10-CM | POA: Diagnosis not present

## 2022-12-18 DIAGNOSIS — R1312 Dysphagia, oropharyngeal phase: Secondary | ICD-10-CM | POA: Diagnosis not present

## 2022-12-18 DIAGNOSIS — R41841 Cognitive communication deficit: Secondary | ICD-10-CM | POA: Diagnosis not present

## 2022-12-18 DIAGNOSIS — R5381 Other malaise: Secondary | ICD-10-CM | POA: Diagnosis not present

## 2022-12-18 DIAGNOSIS — R2681 Unsteadiness on feet: Secondary | ICD-10-CM | POA: Diagnosis not present

## 2022-12-20 DIAGNOSIS — F0390 Unspecified dementia without behavioral disturbance: Secondary | ICD-10-CM | POA: Diagnosis not present

## 2022-12-20 DIAGNOSIS — I6789 Other cerebrovascular disease: Secondary | ICD-10-CM | POA: Diagnosis not present

## 2022-12-20 DIAGNOSIS — R1312 Dysphagia, oropharyngeal phase: Secondary | ICD-10-CM | POA: Diagnosis not present

## 2022-12-20 DIAGNOSIS — E559 Vitamin D deficiency, unspecified: Secondary | ICD-10-CM | POA: Diagnosis not present

## 2022-12-20 DIAGNOSIS — R2681 Unsteadiness on feet: Secondary | ICD-10-CM | POA: Diagnosis not present

## 2022-12-20 DIAGNOSIS — R5381 Other malaise: Secondary | ICD-10-CM | POA: Diagnosis not present

## 2022-12-20 DIAGNOSIS — E782 Mixed hyperlipidemia: Secondary | ICD-10-CM | POA: Diagnosis not present

## 2022-12-20 DIAGNOSIS — M6281 Muscle weakness (generalized): Secondary | ICD-10-CM | POA: Diagnosis not present

## 2022-12-20 DIAGNOSIS — R41841 Cognitive communication deficit: Secondary | ICD-10-CM | POA: Diagnosis not present

## 2022-12-22 DIAGNOSIS — R2689 Other abnormalities of gait and mobility: Secondary | ICD-10-CM | POA: Diagnosis not present

## 2022-12-22 DIAGNOSIS — R1312 Dysphagia, oropharyngeal phase: Secondary | ICD-10-CM | POA: Diagnosis not present

## 2022-12-22 DIAGNOSIS — M6281 Muscle weakness (generalized): Secondary | ICD-10-CM | POA: Diagnosis not present

## 2022-12-22 DIAGNOSIS — N183 Chronic kidney disease, stage 3 unspecified: Secondary | ICD-10-CM | POA: Diagnosis not present

## 2022-12-23 DIAGNOSIS — I10 Essential (primary) hypertension: Secondary | ICD-10-CM | POA: Diagnosis not present

## 2022-12-24 DIAGNOSIS — R2689 Other abnormalities of gait and mobility: Secondary | ICD-10-CM | POA: Diagnosis not present

## 2022-12-24 DIAGNOSIS — N183 Chronic kidney disease, stage 3 unspecified: Secondary | ICD-10-CM | POA: Diagnosis not present

## 2022-12-24 DIAGNOSIS — M6281 Muscle weakness (generalized): Secondary | ICD-10-CM | POA: Diagnosis not present

## 2022-12-24 DIAGNOSIS — R1312 Dysphagia, oropharyngeal phase: Secondary | ICD-10-CM | POA: Diagnosis not present

## 2022-12-25 DIAGNOSIS — R1312 Dysphagia, oropharyngeal phase: Secondary | ICD-10-CM | POA: Diagnosis not present

## 2022-12-25 DIAGNOSIS — Z7189 Other specified counseling: Secondary | ICD-10-CM | POA: Diagnosis not present

## 2022-12-25 DIAGNOSIS — D631 Anemia in chronic kidney disease: Secondary | ICD-10-CM | POA: Diagnosis not present

## 2022-12-25 DIAGNOSIS — M6281 Muscle weakness (generalized): Secondary | ICD-10-CM | POA: Diagnosis not present

## 2022-12-25 DIAGNOSIS — F03C18 Unspecified dementia, severe, with other behavioral disturbance: Secondary | ICD-10-CM | POA: Diagnosis not present

## 2022-12-25 DIAGNOSIS — N183 Chronic kidney disease, stage 3 unspecified: Secondary | ICD-10-CM | POA: Diagnosis not present

## 2022-12-25 DIAGNOSIS — E119 Type 2 diabetes mellitus without complications: Secondary | ICD-10-CM | POA: Diagnosis not present

## 2022-12-25 DIAGNOSIS — R2689 Other abnormalities of gait and mobility: Secondary | ICD-10-CM | POA: Diagnosis not present

## 2022-12-25 DIAGNOSIS — Z7984 Long term (current) use of oral hypoglycemic drugs: Secondary | ICD-10-CM | POA: Diagnosis not present

## 2022-12-25 DIAGNOSIS — E1122 Type 2 diabetes mellitus with diabetic chronic kidney disease: Secondary | ICD-10-CM | POA: Diagnosis not present

## 2022-12-25 DIAGNOSIS — Z66 Do not resuscitate: Secondary | ICD-10-CM | POA: Diagnosis not present

## 2022-12-25 DIAGNOSIS — J449 Chronic obstructive pulmonary disease, unspecified: Secondary | ICD-10-CM | POA: Diagnosis not present

## 2022-12-25 DIAGNOSIS — L89896 Pressure-induced deep tissue damage of other site: Secondary | ICD-10-CM | POA: Diagnosis not present

## 2022-12-26 DIAGNOSIS — N183 Chronic kidney disease, stage 3 unspecified: Secondary | ICD-10-CM | POA: Diagnosis not present

## 2022-12-26 DIAGNOSIS — L988 Other specified disorders of the skin and subcutaneous tissue: Secondary | ICD-10-CM | POA: Diagnosis not present

## 2022-12-26 DIAGNOSIS — E1122 Type 2 diabetes mellitus with diabetic chronic kidney disease: Secondary | ICD-10-CM | POA: Diagnosis not present

## 2022-12-26 DIAGNOSIS — F03C0 Unspecified dementia, severe, without behavioral disturbance, psychotic disturbance, mood disturbance, and anxiety: Secondary | ICD-10-CM | POA: Diagnosis not present

## 2022-12-28 DIAGNOSIS — R1312 Dysphagia, oropharyngeal phase: Secondary | ICD-10-CM | POA: Diagnosis not present

## 2022-12-28 DIAGNOSIS — N183 Chronic kidney disease, stage 3 unspecified: Secondary | ICD-10-CM | POA: Diagnosis not present

## 2022-12-28 DIAGNOSIS — M6281 Muscle weakness (generalized): Secondary | ICD-10-CM | POA: Diagnosis not present

## 2022-12-28 DIAGNOSIS — R2689 Other abnormalities of gait and mobility: Secondary | ICD-10-CM | POA: Diagnosis not present

## 2022-12-29 DIAGNOSIS — M6281 Muscle weakness (generalized): Secondary | ICD-10-CM | POA: Diagnosis not present

## 2022-12-29 DIAGNOSIS — R1312 Dysphagia, oropharyngeal phase: Secondary | ICD-10-CM | POA: Diagnosis not present

## 2022-12-29 DIAGNOSIS — N183 Chronic kidney disease, stage 3 unspecified: Secondary | ICD-10-CM | POA: Diagnosis not present

## 2022-12-29 DIAGNOSIS — R2689 Other abnormalities of gait and mobility: Secondary | ICD-10-CM | POA: Diagnosis not present

## 2023-01-01 DIAGNOSIS — F03C0 Unspecified dementia, severe, without behavioral disturbance, psychotic disturbance, mood disturbance, and anxiety: Secondary | ICD-10-CM | POA: Diagnosis not present

## 2023-01-01 DIAGNOSIS — F32A Depression, unspecified: Secondary | ICD-10-CM | POA: Diagnosis not present

## 2023-01-02 DIAGNOSIS — N183 Chronic kidney disease, stage 3 unspecified: Secondary | ICD-10-CM | POA: Diagnosis not present

## 2023-01-02 DIAGNOSIS — R1312 Dysphagia, oropharyngeal phase: Secondary | ICD-10-CM | POA: Diagnosis not present

## 2023-01-02 DIAGNOSIS — M6281 Muscle weakness (generalized): Secondary | ICD-10-CM | POA: Diagnosis not present

## 2023-01-02 DIAGNOSIS — R2689 Other abnormalities of gait and mobility: Secondary | ICD-10-CM | POA: Diagnosis not present

## 2023-01-04 DIAGNOSIS — M6281 Muscle weakness (generalized): Secondary | ICD-10-CM | POA: Diagnosis not present

## 2023-01-04 DIAGNOSIS — N183 Chronic kidney disease, stage 3 unspecified: Secondary | ICD-10-CM | POA: Diagnosis not present

## 2023-01-04 DIAGNOSIS — R1312 Dysphagia, oropharyngeal phase: Secondary | ICD-10-CM | POA: Diagnosis not present

## 2023-01-04 DIAGNOSIS — R2689 Other abnormalities of gait and mobility: Secondary | ICD-10-CM | POA: Diagnosis not present

## 2023-01-08 ENCOUNTER — Inpatient Hospital Stay (HOSPITAL_COMMUNITY)
Admission: EM | Admit: 2023-01-08 | Discharge: 2023-01-11 | DRG: 641 | Disposition: A | Discharge status: Other Acute Inpatient Hospital | Payer: Medicare HMO | Source: Skilled Nursing Facility | Attending: Family Medicine | Admitting: Family Medicine

## 2023-01-08 ENCOUNTER — Emergency Department (HOSPITAL_COMMUNITY): Payer: Medicare HMO

## 2023-01-08 ENCOUNTER — Other Ambulatory Visit: Payer: Self-pay

## 2023-01-08 DIAGNOSIS — E1122 Type 2 diabetes mellitus with diabetic chronic kidney disease: Secondary | ICD-10-CM | POA: Diagnosis not present

## 2023-01-08 DIAGNOSIS — F0393 Unspecified dementia, unspecified severity, with mood disturbance: Secondary | ICD-10-CM | POA: Diagnosis present

## 2023-01-08 DIAGNOSIS — R4589 Other symptoms and signs involving emotional state: Secondary | ICD-10-CM

## 2023-01-08 DIAGNOSIS — I700 Atherosclerosis of aorta: Secondary | ICD-10-CM | POA: Diagnosis present

## 2023-01-08 DIAGNOSIS — E11319 Type 2 diabetes mellitus with unspecified diabetic retinopathy without macular edema: Secondary | ICD-10-CM | POA: Diagnosis present

## 2023-01-08 DIAGNOSIS — F0390 Unspecified dementia without behavioral disturbance: Secondary | ICD-10-CM

## 2023-01-08 DIAGNOSIS — E860 Dehydration: Secondary | ICD-10-CM | POA: Diagnosis present

## 2023-01-08 DIAGNOSIS — E876 Hypokalemia: Secondary | ICD-10-CM | POA: Diagnosis present

## 2023-01-08 DIAGNOSIS — R0902 Hypoxemia: Secondary | ICD-10-CM | POA: Diagnosis not present

## 2023-01-08 DIAGNOSIS — R627 Adult failure to thrive: Secondary | ICD-10-CM | POA: Diagnosis not present

## 2023-01-08 DIAGNOSIS — Z66 Do not resuscitate: Secondary | ICD-10-CM | POA: Diagnosis present

## 2023-01-08 DIAGNOSIS — Z87891 Personal history of nicotine dependence: Secondary | ICD-10-CM | POA: Diagnosis not present

## 2023-01-08 DIAGNOSIS — R001 Bradycardia, unspecified: Secondary | ICD-10-CM | POA: Diagnosis present

## 2023-01-08 DIAGNOSIS — N1832 Chronic kidney disease, stage 3b: Secondary | ICD-10-CM | POA: Diagnosis present

## 2023-01-08 DIAGNOSIS — E119 Type 2 diabetes mellitus without complications: Secondary | ICD-10-CM

## 2023-01-08 DIAGNOSIS — E440 Moderate protein-calorie malnutrition: Secondary | ICD-10-CM | POA: Diagnosis not present

## 2023-01-08 DIAGNOSIS — F03C18 Unspecified dementia, severe, with other behavioral disturbance: Secondary | ICD-10-CM | POA: Diagnosis not present

## 2023-01-08 DIAGNOSIS — Z79899 Other long term (current) drug therapy: Secondary | ICD-10-CM

## 2023-01-08 DIAGNOSIS — R4189 Other symptoms and signs involving cognitive functions and awareness: Secondary | ICD-10-CM | POA: Diagnosis not present

## 2023-01-08 DIAGNOSIS — F32A Depression, unspecified: Secondary | ICD-10-CM | POA: Diagnosis present

## 2023-01-08 DIAGNOSIS — Z515 Encounter for palliative care: Secondary | ICD-10-CM | POA: Diagnosis not present

## 2023-01-08 DIAGNOSIS — E7800 Pure hypercholesterolemia, unspecified: Secondary | ICD-10-CM | POA: Diagnosis present

## 2023-01-08 DIAGNOSIS — I6381 Other cerebral infarction due to occlusion or stenosis of small artery: Secondary | ICD-10-CM | POA: Diagnosis not present

## 2023-01-08 DIAGNOSIS — I129 Hypertensive chronic kidney disease with stage 1 through stage 4 chronic kidney disease, or unspecified chronic kidney disease: Secondary | ICD-10-CM | POA: Diagnosis present

## 2023-01-08 DIAGNOSIS — N183 Chronic kidney disease, stage 3 unspecified: Secondary | ICD-10-CM | POA: Diagnosis not present

## 2023-01-08 DIAGNOSIS — Z888 Allergy status to other drugs, medicaments and biological substances status: Secondary | ICD-10-CM

## 2023-01-08 DIAGNOSIS — E1169 Type 2 diabetes mellitus with other specified complication: Secondary | ICD-10-CM | POA: Diagnosis not present

## 2023-01-08 DIAGNOSIS — R296 Repeated falls: Secondary | ICD-10-CM | POA: Diagnosis present

## 2023-01-08 DIAGNOSIS — I150 Renovascular hypertension: Secondary | ICD-10-CM | POA: Diagnosis not present

## 2023-01-08 DIAGNOSIS — Z7984 Long term (current) use of oral hypoglycemic drugs: Secondary | ICD-10-CM

## 2023-01-08 DIAGNOSIS — F03911 Unspecified dementia, unspecified severity, with agitation: Secondary | ICD-10-CM | POA: Diagnosis not present

## 2023-01-08 DIAGNOSIS — N400 Enlarged prostate without lower urinary tract symptoms: Secondary | ICD-10-CM | POA: Diagnosis not present

## 2023-01-08 DIAGNOSIS — G459 Transient cerebral ischemic attack, unspecified: Secondary | ICD-10-CM | POA: Diagnosis not present

## 2023-01-08 DIAGNOSIS — I10 Essential (primary) hypertension: Secondary | ICD-10-CM | POA: Diagnosis not present

## 2023-01-08 DIAGNOSIS — Z1152 Encounter for screening for COVID-19: Secondary | ICD-10-CM | POA: Diagnosis not present

## 2023-01-08 DIAGNOSIS — R079 Chest pain, unspecified: Secondary | ICD-10-CM | POA: Diagnosis not present

## 2023-01-08 DIAGNOSIS — Z7189 Other specified counseling: Secondary | ICD-10-CM

## 2023-01-08 DIAGNOSIS — Z8673 Personal history of transient ischemic attack (TIA), and cerebral infarction without residual deficits: Secondary | ICD-10-CM | POA: Diagnosis not present

## 2023-01-08 DIAGNOSIS — K219 Gastro-esophageal reflux disease without esophagitis: Secondary | ICD-10-CM | POA: Diagnosis present

## 2023-01-08 DIAGNOSIS — J449 Chronic obstructive pulmonary disease, unspecified: Secondary | ICD-10-CM | POA: Diagnosis not present

## 2023-01-08 DIAGNOSIS — Z7982 Long term (current) use of aspirin: Secondary | ICD-10-CM

## 2023-01-08 DIAGNOSIS — Z9049 Acquired absence of other specified parts of digestive tract: Secondary | ICD-10-CM

## 2023-01-08 DIAGNOSIS — Z85038 Personal history of other malignant neoplasm of large intestine: Secondary | ICD-10-CM

## 2023-01-08 DIAGNOSIS — Z6823 Body mass index (BMI) 23.0-23.9, adult: Secondary | ICD-10-CM

## 2023-01-08 DIAGNOSIS — R6889 Other general symptoms and signs: Secondary | ICD-10-CM | POA: Diagnosis not present

## 2023-01-08 DIAGNOSIS — Z7401 Bed confinement status: Secondary | ICD-10-CM | POA: Diagnosis not present

## 2023-01-08 DIAGNOSIS — I959 Hypotension, unspecified: Secondary | ICD-10-CM | POA: Diagnosis not present

## 2023-01-08 LAB — I-STAT CHEM 8, ED
BUN: 27 mg/dL — ABNORMAL HIGH (ref 8–23)
Calcium, Ion: 0.95 mmol/L — ABNORMAL LOW (ref 1.15–1.40)
Chloride: 114 mmol/L — ABNORMAL HIGH (ref 98–111)
Creatinine, Ser: 1.2 mg/dL (ref 0.61–1.24)
Glucose, Bld: 74 mg/dL (ref 70–99)
HCT: 27 % — ABNORMAL LOW (ref 39.0–52.0)
Hemoglobin: 9.2 g/dL — ABNORMAL LOW (ref 13.0–17.0)
Potassium: 3.2 mmol/L — ABNORMAL LOW (ref 3.5–5.1)
Sodium: 147 mmol/L — ABNORMAL HIGH (ref 135–145)
TCO2: 21 mmol/L — ABNORMAL LOW (ref 22–32)

## 2023-01-08 LAB — DIFFERENTIAL
Abs Immature Granulocytes: 0.01 10*3/uL (ref 0.00–0.07)
Basophils Absolute: 0 10*3/uL (ref 0.0–0.1)
Basophils Relative: 0 %
Eosinophils Absolute: 0.1 10*3/uL (ref 0.0–0.5)
Eosinophils Relative: 2 %
Immature Granulocytes: 0 %
Lymphocytes Relative: 35 %
Lymphs Abs: 2.2 10*3/uL (ref 0.7–4.0)
Monocytes Absolute: 0.6 10*3/uL (ref 0.1–1.0)
Monocytes Relative: 9 %
Neutro Abs: 3.4 10*3/uL (ref 1.7–7.7)
Neutrophils Relative %: 54 %

## 2023-01-08 LAB — URINALYSIS, ROUTINE W REFLEX MICROSCOPIC
Bacteria, UA: NONE SEEN
Bilirubin Urine: NEGATIVE
Glucose, UA: >=500 mg/dL — AB
Hgb urine dipstick: NEGATIVE
Ketones, ur: NEGATIVE mg/dL
Leukocytes,Ua: NEGATIVE
Nitrite: NEGATIVE
Protein, ur: NEGATIVE mg/dL
Specific Gravity, Urine: 1.01 (ref 1.005–1.030)
pH: 6 (ref 5.0–8.0)

## 2023-01-08 LAB — COMPREHENSIVE METABOLIC PANEL
ALT: 10 U/L (ref 0–44)
AST: 14 U/L — ABNORMAL LOW (ref 15–41)
Albumin: 2.1 g/dL — ABNORMAL LOW (ref 3.5–5.0)
Alkaline Phosphatase: 37 U/L — ABNORMAL LOW (ref 38–126)
Anion gap: 7 (ref 5–15)
BUN: 24 mg/dL — ABNORMAL HIGH (ref 8–23)
CO2: 16 mmol/L — ABNORMAL LOW (ref 22–32)
Calcium: 6.2 mg/dL — CL (ref 8.9–10.3)
Chloride: 121 mmol/L — ABNORMAL HIGH (ref 98–111)
Creatinine, Ser: 1.16 mg/dL (ref 0.61–1.24)
GFR, Estimated: >60 mL/min (ref >60)
Glucose, Bld: 73 mg/dL (ref 70–99)
Potassium: 3.3 mmol/L — ABNORMAL LOW (ref 3.5–5.1)
Sodium: 144 mmol/L (ref 135–145)
Total Bilirubin: 0.8 mg/dL (ref 0.3–1.2)
Total Protein: 4.2 g/dL — ABNORMAL LOW (ref 6.5–8.1)

## 2023-01-08 LAB — RESP PANEL BY RT-PCR (RSV, FLU A&B, COVID)  RVPGX2
Influenza A by PCR: NEGATIVE
Influenza B by PCR: NEGATIVE
Resp Syncytial Virus by PCR: NEGATIVE
SARS Coronavirus 2 by RT PCR: NEGATIVE

## 2023-01-08 LAB — CBC
HCT: 39.5 % (ref 39.0–52.0)
Hemoglobin: 12.7 g/dL — ABNORMAL LOW (ref 13.0–17.0)
MCH: 26.6 pg (ref 26.0–34.0)
MCHC: 32.2 g/dL (ref 30.0–36.0)
MCV: 82.6 fL (ref 80.0–100.0)
Platelets: 168 10*3/uL (ref 150–400)
RBC: 4.78 MIL/uL (ref 4.22–5.81)
RDW: 15 % (ref 11.5–15.5)
WBC: 6.3 10*3/uL (ref 4.0–10.5)
nRBC: 0 % (ref 0.0–0.2)

## 2023-01-08 LAB — MAGNESIUM: Magnesium: 1.4 mg/dL — ABNORMAL LOW (ref 1.7–2.4)

## 2023-01-08 MED ORDER — ATORVASTATIN CALCIUM 10 MG PO TABS
10.0000 mg | ORAL_TABLET | ORAL | Status: DC
  Administered 2023-01-09: 10 mg via ORAL
  Filled 2023-01-08: qty 1

## 2023-01-08 MED ORDER — PANTOPRAZOLE SODIUM 40 MG PO TBEC
40.0000 mg | DELAYED_RELEASE_TABLET | Freq: Every day | ORAL | Status: DC
  Administered 2023-01-09 – 2023-01-11 (×3): 40 mg via ORAL
  Filled 2023-01-08 (×4): qty 1

## 2023-01-08 MED ORDER — ENOXAPARIN SODIUM 40 MG/0.4ML IJ SOSY
40.0000 mg | PREFILLED_SYRINGE | INTRAMUSCULAR | Status: DC
  Administered 2023-01-08 – 2023-01-10 (×3): 40 mg via SUBCUTANEOUS
  Filled 2023-01-08 (×3): qty 0.4

## 2023-01-08 MED ORDER — SODIUM CHLORIDE 0.9 % IV SOLN
100.0000 mL/h | INTRAVENOUS | Status: DC
  Administered 2023-01-08 – 2023-01-09 (×4): 100 mL/h via INTRAVENOUS

## 2023-01-08 MED ORDER — LISINOPRIL 10 MG PO TABS
20.0000 mg | ORAL_TABLET | Freq: Every day | ORAL | Status: DC
  Administered 2023-01-09 – 2023-01-11 (×3): 20 mg via ORAL
  Filled 2023-01-08 (×4): qty 2

## 2023-01-08 MED ORDER — SERTRALINE HCL 100 MG PO TABS
100.0000 mg | ORAL_TABLET | Freq: Every day | ORAL | Status: DC
  Administered 2023-01-09 – 2023-01-11 (×3): 100 mg via ORAL
  Filled 2023-01-08 (×4): qty 1

## 2023-01-08 MED ORDER — SODIUM CHLORIDE 0.9 % IV BOLUS
500.0000 mL | Freq: Once | INTRAVENOUS | Status: AC
  Administered 2023-01-08: 500 mL via INTRAVENOUS

## 2023-01-08 MED ORDER — TAMSULOSIN HCL 0.4 MG PO CAPS
0.4000 mg | ORAL_CAPSULE | Freq: Every day | ORAL | Status: DC
  Administered 2023-01-09 – 2023-01-11 (×3): 0.4 mg via ORAL
  Filled 2023-01-08 (×4): qty 1

## 2023-01-08 MED ORDER — FINASTERIDE 5 MG PO TABS
5.0000 mg | ORAL_TABLET | Freq: Every day | ORAL | Status: DC
  Administered 2023-01-09 – 2023-01-11 (×3): 5 mg via ORAL
  Filled 2023-01-08 (×4): qty 1

## 2023-01-08 MED ORDER — VITAMIN B-12 100 MCG PO TABS
100.0000 ug | ORAL_TABLET | Freq: Every day | ORAL | Status: DC
  Administered 2023-01-09 – 2023-01-11 (×3): 100 ug via ORAL
  Filled 2023-01-08 (×3): qty 1

## 2023-01-08 MED ORDER — DONEPEZIL HCL 5 MG PO TABS: 10.0000 mg | ORAL_TABLET | Freq: Every day | ORAL | Status: DC

## 2023-01-08 MED ORDER — POTASSIUM CHLORIDE 10 MEQ/100ML IV SOLN
10.0000 meq | INTRAVENOUS | Status: AC
  Administered 2023-01-08 (×2): 10 meq via INTRAVENOUS
  Filled 2023-01-08 (×2): qty 100

## 2023-01-08 MED ORDER — HYDROCHLOROTHIAZIDE 12.5 MG PO TABS
25.0000 mg | ORAL_TABLET | Freq: Every morning | ORAL | Status: DC
  Administered 2023-01-09 – 2023-01-11 (×3): 25 mg via ORAL
  Filled 2023-01-08 (×5): qty 2

## 2023-01-08 MED ORDER — VITAMIN D 25 MCG (1000 UNIT) PO TABS
3000.0000 [IU] | ORAL_TABLET | Freq: Every day | ORAL | Status: DC
  Administered 2023-01-09 – 2023-01-11 (×3): 3000 [IU] via ORAL
  Filled 2023-01-08 (×5): qty 3

## 2023-01-08 MED ORDER — CALCIUM GLUCONATE-NACL 1-0.675 GM/50ML-% IV SOLN
1.0000 g | Freq: Once | INTRAVENOUS | Status: AC
  Administered 2023-01-08: 1000 mg via INTRAVENOUS
  Filled 2023-01-08: qty 50

## 2023-01-08 MED ORDER — ASPIRIN 81 MG PO CHEW
81.0000 mg | CHEWABLE_TABLET | ORAL | Status: DC
  Administered 2023-01-09 – 2023-01-11 (×2): 81 mg via ORAL
  Filled 2023-01-08 (×3): qty 1

## 2023-01-08 NOTE — ED Provider Notes (Signed)
[place] EMERGENCY DEPARTMENT AT [facility] Provider Note   CSN: [id number] Arrival date & time: 01/08/23  1356     History {Add pertinent medical, surgical, social history, OB history to HPI:1} Chief Complaint  Patient presents with   Failure To Thrive    Ronald Dyer is a 82 y.o. male.  HPI   Patient has a history of diabetes, depression, COPD, stroke, colon cancer, dementia, malnutrition, hypertension, hyperlipidemia.  Patient was admitted to the hospital on March 27 and was discharged on April 3.  At that time the patient was admitted to the hospital for altered mental status.  Patient had an MRI that showed evidence of remote infarcts.  Patient's mental status improved.  Ultimately felt that his symptoms were likely related to dementia contributing to his condition.  They recommended nursing home placement.  Wife states that he has been gradually getting worse.  He is not speaking as much.  He is not eating as much.  Patient will answer my questions with just a couple of words.  He denies having any pain.  He is unable to tell me if he has been eating or drinking well.  Home Medications Prior to Admission medications   Medication Sig Start Date End Date Taking? Authorizing Provider  aspirin 81 MG tablet Take 81 mg by mouth every other day.    [provider]  atorvastatin (LIPITOR) 10 MG tablet Take 10 mg by mouth See admin instructions. Take 10 mg by mouth on Tuesday(s) and Thursday(s)    [provider]  donepezil (ARICEPT) 10 MG tablet TAKE ONE TABLET BY MOUTH EVERYDAY AT BEDTIME Needs appointment for further refills 10/18/22   Butch Penny, NP  finasteride (PROSCAR) 5 MG tablet Take 5 mg by mouth daily. 10/25/22   [provider]  hydrochlorothiazide (HYDRODIURIL) 25 MG tablet Take 25 mg by mouth every morning. 10/25/22   [provider]  JARDIANCE 25 MG TABS tablet Take 25 mg by mouth daily.    [provider]   lisinopril (ZESTRIL) 20 MG tablet Take 20 mg by mouth daily. 10/25/22   [provider]  Multiple Vitamins-Minerals (MULTIVITAMIN WITH MINERALS) tablet Take 1 tablet by mouth daily.    [provider]  pantoprazole (PROTONIX) 40 MG tablet Take 1 tablet (40 mg total) by mouth daily at 6 (six) AM. 02/07/20   Kathlen Mody, MD  pioglitazone (ACTOS) 45 MG tablet Take 45 mg by mouth daily. 10/25/22   [provider]  sertraline (ZOLOFT) 100 MG tablet Take 100 mg by mouth daily.    [provider]  tamsulosin (FLOMAX) 0.4 MG CAPS capsule Take 0.4 mg by mouth daily.    [provider]  vitamin B-12 (CYANOCOBALAMIN) 100 MCG tablet Take 100 mcg by mouth daily.    [provider]      Allergies    Metformin hcl and Simvastatin    Review of Systems   Review of Systems  Physical Exam Updated Vital Signs BP 120/62   Pulse (!) 50   Temp (!) 96.7 F (35.9 C) (Axillary)   Resp 19   Ht 1.829 m (6')   SpO2 98%   BMI 23.02 kg/m  Physical Exam Vitals and nursing note reviewed.  Constitutional:      Appearance: He is well-developed. He is not diaphoretic.  HENT:     Head: Normocephalic and atraumatic.     Right Ear: External ear normal.     Left Ear: External ear  normal.     Mouth/Throat:     Mouth: Mucous membranes are dry.  Eyes:     General: No scleral icterus.       Right eye: No discharge.        Left eye: No discharge.     Conjunctiva/sclera: Conjunctivae normal.  Neck:     Trachea: No tracheal deviation.  Cardiovascular:     Rate and Rhythm: Normal rate and regular rhythm.  Pulmonary:     Effort: Pulmonary effort is normal. No respiratory distress.     Breath sounds: Normal breath sounds. No stridor. No wheezing or rales.  Abdominal:     General: Bowel sounds are normal. There is no distension.     Palpations: Abdomen is soft.     Tenderness: There is no abdominal tenderness. There is no guarding or rebound.  Musculoskeletal:         General: No tenderness or deformity.     Cervical back: Neck supple.  Skin:    General: Skin is warm and dry.     Findings: No rash.  Neurological:     General: No focal deficit present.     Mental Status: He is alert.     Cranial Nerves: No cranial nerve deficit, dysarthria or facial asymmetry.     Sensory: No sensory deficit.     Motor: Weakness present. No abnormal muscle tone or seizure activity.     Comments: Patient will not hold his arms or legs off the bed, no facial droop,  Psychiatric:        Mood and Affect: Mood normal.     ED Results / Procedures / Treatments   Labs (all labs ordered are listed, but only abnormal results are displayed) Labs Reviewed - No data to display  EKG None  Radiology No results found.  Procedures Procedures  {Document cardiac monitor, telemetry assessment procedure when appropriate:1}  Medications Ordered in ED Medications - No data to display  ED Course/ Medical Decision Making/ A&P   {   Click here for ABCD2, HEART and other calculatorsREFRESH Note before signing :1}                          Medical Decision Making Amount and/or Complexity of Data Reviewed Labs: ordered. Radiology: ordered.  Risk Prescription drug management.   ***  {Document critical care time when appropriate:1} {Document review of labs and clinical decision tools ie heart score, Chads2Vasc2 etc:1}  {Document your independent review of radiology images, and any outside records:1} {Document your discussion with family members, caretakers, and with consultants:1} {Document social determinants of health affecting pt's care:1} {Document your decision making why or why not admission, treatments were needed:1} Final Clinical Impression(s) / ED Diagnoses Final diagnoses:  None    Rx / DC Orders ED Discharge Orders     None

## 2023-01-08 NOTE — ED Notes (Addendum)
Patient transported to CT 

## 2023-01-08 NOTE — ED Provider Notes (Signed)
Patient turned over to me for failure to thrive.  Patient is from rehab facility.  Patient's electrolytes with a critical value of a calcium of 6.2.  Patient will be given IV calcium.  Will require admission for this to be placed on cardiac monitor.  Review of EKG shows no prolonged QT.  Patient's albumin is low at 2.1 this could be contributing.  Patient's renal function is actually with a creatinine of 1.2 BUN is up at 27 clinically he does look dehydrated but his GFR comes in greater than 60.  Respiratory panel negative CBC hemoglobin down a little bit at 12.7 white count is 6.2 his platelets are good at 168 chest x-ray essentially negative but there could be some pleural thickening on the left lobe because did CP angle is indistinct.  And there could be a minimal effusion there.  Head CT without anything acute.  Will contact hospitalist for admission.  Patient is receiving IV fluids.  Patient also is on hydrochlorothiazide this could be responsible for the hypocalcemia.  But he has had normal calciums recently.  Patient on physical exam is not showing any evidence of any Trousseau sign's or prostatic signs.  No obvious spasms but patient is overall fairly deconditioned.  He is alert and will talk in brief words.  CRITICAL CARE Performed by: Vanetta Mulders Total critical care time: 35 minutes Critical care time was exclusive of separately billable procedures and treating other patients. Critical care was necessary to treat or prevent imminent or life-threatening deterioration. Critical care was time spent personally by me on the following activities: development of treatment plan with patient and/or surrogate as well as nursing, discussions with consultants, evaluation of patient's response to treatment, examination of patient, obtaining history from patient or surrogate, ordering and performing treatments and interventions, ordering and review of laboratory studies, ordering and review of  radiographic studies, pulse oximetry and re-evaluation of patient's condition.    Vanetta Mulders, MD 01/08/23 [id number]

## 2023-01-08 NOTE — Assessment & Plan Note (Signed)
-  Baseline Cr around 1.5-1.7 -Cr 1.16 on presentation -on continuous IV fluid

## 2023-01-08 NOTE — Assessment & Plan Note (Addendum)
-  corrected Ca of 7.3 -no EKG changes or prolonged QT -administered IV Ca -check Mg level

## 2023-01-08 NOTE — Assessment & Plan Note (Signed)
-  not in exacerbation. Continue bronchodilator.

## 2023-01-08 NOTE — Assessment & Plan Note (Signed)
-  continue aspirin and Lipitor

## 2023-01-08 NOTE — H&P (Signed)
History and Physical    Patient: Ronald Dyer ZOX:[id number] DOB: 1940/09/10 DOA: 01/08/2023 DOS: the patient was seen and examined on 01/08/2023 PCP: Sigmund Hazel, MD  Patient coming from:  [facility] rehab  Chief Complaint:  Chief Complaint  Patient presents with   Failure To Thrive   HPI: Ronald Dyer is a 82 y.o. male with medical history significant of advanced dementia, CKD stage 3b, CVA, HTN, COPD, T2DM, Colon cancer s/p right hemicolectomy, BPH, HLD who presents with AMS.   No family at bedside. I spoke with wife over the phone. Pt has been at [facility] since March. Last few weeks has been having trouble with solid food due to coughing and was transitioned to a pureed diet. No nausea, vomiting or diarrhea. She felt today he was "out of it." Just not waking up or responding.   On evaluation, Pt was alert and oriented to self and place. He denies any pain or any nausea, vomiting or diarrhea. Otherwise very drowsy and lethargic and asleep throughout most of evaluation.   He was just hospitalized 11/15/22-11/22/22 with AMS thought due to vitamin B12 deficiency and dehydration. He was evaluated by hospice on 11/20/2022 and not a candidate for residential hospice.   In the ED, he was afebrile, bradycardic to 40-50s, normotensive on room air.  CBC without leukocytosis or anemia.   Hypokalemia of 3.3, Corrected Ca of 7.3. Creatinine stable at 1.16.  UA negative. CXR negative, CT head negative.   He was given IV fluids and IV calcium and hospitalist was consulted for admission.   Review of Systems: unable to review all systems due to the inability of the patient to answer questions. 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, Dr Clelia Croft d/c 06/2014    Stroke [facility])    Past Surgical History:  Procedure Laterality Date   cataracts Bilateral 2011   COLONOSCOPY WITH PROPOFOL N/A 11/09/2020   Procedure: COLONOSCOPY WITH PROPOFOL;  Surgeon: Charlott Rakes, MD;  Location: [facility] ENDOSCOPY;  Service: Endoscopy;  Laterality: N/A;  Flex Sig-poor prep   ESOPHAGOGASTRODUODENOSCOPY (EGD) WITH PROPOFOL N/A 11/09/2020   Procedure: ESOPHAGOGASTRODUODENOSCOPY (EGD) WITH PROPOFOL;  Surgeon: Charlott Rakes, MD;  Location: [facility] ENDOSCOPY;  Service: Endoscopy;  Laterality: N/A;   HEMICOLECTOMY Right 03/05/2009   UMBILICAL HERNIA REPAIR  03/05/2009   Social History:  reports that he quit smoking about 24 years ago. His smoking use included cigarettes. He has quit using smokeless tobacco. He reports that he does not drink alcohol and does not use drugs.  Allergies  Allergen Reactions   Metformin Hcl Diarrhea and Other (See Comments)     "Allergic," per paperwork from facility   Simvastatin Other (See Comments)    High CPK and "Allergic," per paperwork from facility    Family History  Problem Relation Age of Onset   Cancer Father     Prior to Admission medications   Medication Sig Start Date End Date Taking? Authorizing Provider  aspirin 81  MG tablet Take 81 mg by mouth every other day.    [provider]  atorvastatin (LIPITOR) 10 MG tablet Take 10 mg by mouth See admin instructions. Take 10 mg by mouth on Tuesday(s) and Thursday(s)    [provider]  donepezil (ARICEPT) 10 MG tablet TAKE ONE TABLET BY MOUTH EVERYDAY AT BEDTIME Needs appointment for further refills 10/18/22   Butch Penny, NP  finasteride (PROSCAR) 5 MG tablet Take 5 mg by mouth daily. 10/25/22   [provider]  hydrochlorothiazide  (HYDRODIURIL) 25 MG tablet Take 25 mg by mouth every morning. 10/25/22   [provider]  JARDIANCE 25 MG TABS tablet Take 25 mg by mouth daily.    [provider]  lisinopril (ZESTRIL) 20 MG tablet Take 20 mg by mouth daily. 10/25/22   [provider]  Multiple Vitamins-Minerals (MULTIVITAMIN WITH MINERALS) tablet Take 1 tablet by mouth daily.    [provider]  pantoprazole (PROTONIX) 40 MG tablet Take 1 tablet (40 mg total) by mouth daily at 6 (six) AM. 02/07/20   Kathlen Mody, MD  pioglitazone (ACTOS) 45 MG tablet Take 45 mg by mouth daily. 10/25/22   [provider]  sertraline (ZOLOFT) 100 MG tablet Take 100 mg by mouth daily.    [provider]  tamsulosin (FLOMAX) 0.4 MG CAPS capsule Take 0.4 mg by mouth daily.    [provider]  vitamin B-12 (CYANOCOBALAMIN) 100 MCG tablet Take 100 mcg by mouth daily.    [provider]    Physical Exam: Vitals:   01/08/23 1915 01/08/23 1930 01/08/23 1945 01/08/23 1957  BP: (!) 154/55 (!) 155/62 (!) 156/79 (!) 154/70  Pulse: (!) 44 (!) 44 (!) 39 64  Resp: 13 16 12 15   Temp:    98.1 F (36.7 C)  TempSrc:      SpO2: 98% 100% 97% 99%  Height:       Constitutional: NAD, calm, comfortable, lethargic elderly male laying in bed. Awoke to voice and light touch. Eyes: PERRL, lids and conjunctivae normal ENMT: Mucous membranes are moist.  Neck: normal, supple Respiratory: clear to auscultation bilaterally, no wheezing, no crackles. Normal respiratory effort. No accessory muscle use.  Cardiovascular: Regular rate and rhythm, no murmurs / rubs / gallops. No extremity edema.  Abdomen: soft, non-tender Musculoskeletal: no clubbing / cyanosis. No joint deformity upper and lower extremities. Muscle wasting on all extremities Skin: no rashes, lesions, ulcers. No induration Neurologic: CN 2-12 grossly intact. Left UE contracted and held in flexion. Had weak bilateral hand grip. Not able to  lift lower extremities against gravity.  Psychiatric: lethargic, normal mood  Data Reviewed:  See HPI  Assessment and Plan: * Hypocalcemia -corrected Ca of 7.3 -no EKG changes or prolonged QT -administered IV Ca -check Mg level  Chronic kidney disease, stage 3b (HCC) -Baseline Cr around 1.5-1.7 -Cr 1.16 on presentation -on continuous IV fluid  Failure to thrive in adult -wife reports pt having aspiration with solids recently and facility has been putting him on pureed diet. Has scheduled swallowing study coming up.  -will get speech to evaluate here -keep on dysphagic diet for now  History of stroke -continue aspirin and Lipitor  COPD (chronic obstructive pulmonary disease) (HCC) -not in exacerbation. Continue bronchodilator.  Sinus bradycardia -this was also present at recent admission. Asymptomatic.   BPH (benign prostatic hyperplasia) -continue finasteride and tamsulosin  Type 2 diabetes mellitus (HCC) -A1c 6.4% on 11/16/22 -no hyperglycemia. Monitor without SSI for now.  Advance Care Planning:DNR- had documentation at bedside. Wife also confirms DNR status  Consults: none  Family Communication: Discussed with wife over the phone  Severity of Illness: The appropriate patient status for this patient is OBSERVATION. Observation status is judged to be reasonable and necessary in order to provide the required intensity of service to ensure the patient's safety. The patient's presenting symptoms, physical exam findings, and initial radiographic and laboratory data in the context of their medical condition is felt to place them at decreased risk for further clinical deterioration. Furthermore, it is anticipated that the patient will be medically stable for discharge from the hospital within 2 midnights of admission.   Author: Anselm Jungling, DO 01/08/2023 8:39 PM  For on call review [email or web address].

## 2023-01-08 NOTE — ED Notes (Signed)
ED TO INPATIENT HANDOFF REPORT  Name/Age/Gender Ronald Dyer 82 y.o. male  Code Status Code Status History     Date Active Date Inactive Code Status Order ID Comments User Context   11/16/2022 0056 11/23/2022 0019 DNR [id number]  John Giovanni, MD ED   11/12/2021 2228 11/17/2021 1757 DNR [id number]  Eduard Clos, MD Inpatient   02/04/2020 0143 02/06/2020 2021 Full Code [id number]  John Giovanni, MD ED    Questions for Most Recent Historical Code Status (Order [id number])     Question Answer   If patient has no pulse and is not breathing Do Not Attempt Resuscitation   If patient has a pulse and/or is breathing: Medical Treatment Goals LIMITED ADDITIONAL INTERVENTIONS: Use medication/IV fluids and cardiac monitoring as indicated; Do not use intubation or mechanical ventilation (DNI), also provide comfort medications.  Transfer to Progressive/Stepdown as indicated, avoid Intensive Care.   Consent: Discussion documented in EHR or advanced directives reviewed                Advance Directive Documentation    Flowsheet Row Most Recent Value  Type of Advance Directive Out of facility DNR (pink MOST or yellow form)  Pre-existing out of facility DNR order (yellow form or pink MOST form) --  "MOST" Form in Place? --       Home/SNF/Other SNF  Chief Complaint Hypocalcemia [E83.51]  Level of Care/Admitting Diagnosis ED Disposition     ED Disposition  Admit   Condition  --   Comment  Hospital Area: [facility] [[id number]]  Level of Care: Med-Surg [16]  May place patient in observation at [facility] or [facility] if equivalent level of care is available:: No  Covid Evaluation: Asymptomatic - no recent exposure (last 10 days) testing not required  Diagnosis: Hypocalcemia [275.41.ICD-9-CM]  Admitting Physician: Anselm Jungling [[id number]]  Attending Physician: Anselm Jungling [[id number]]          Medical History Past Medical History:  Diagnosis  Date   Atherosclerosis of aorta (HCC)    CKD (chronic kidney disease), stage III (HCC)    COPD (chronic obstructive pulmonary disease) (HCC)    Depression    Diabetes mellitus without complication (HCC)    Diabetic retinopathy (HCC)    Frequent falls    GERD (gastroesophageal reflux disease)    History of rhabdomyolysis    Hypercholesterolemia    Hypertension    Memory loss    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, Dr Clelia Croft d/c 06/2014    Stroke (HCC)     Allergies Allergies  Allergen Reactions   Metformin Hcl Diarrhea   Simvastatin Other (See Comments)    High CPK     IV Location/Drains/Wounds Patient Lines/Drains/Airways Status     Active Line/Drains/Airways     Name Placement date Placement time Site Days   Peripheral IV 01/08/23 22 G Left Forearm 01/08/23  1402  Forearm  less than 1   External Urinary Catheter 11/12/21  2303  --  422            Labs/Imaging Results for orders placed or performed during the hospital encounter of 01/08/23 (from the past 48 hour(s))  CBC     Status: Abnormal   Collection Time: 01/08/23  2:16 PM  Result Value Ref Range   WBC 6.3 4.0 - 10.5 K/uL   RBC 4.78 4.22 - 5.81 MIL/uL   Hemoglobin 12.7 (L) 13.0 - 17.0 g/dL  HCT 39.5 39.0 - 52.0 %   MCV 82.6 80.0 - 100.0 fL   MCH 26.6 26.0 - 34.0 pg   MCHC 32.2 30.0 - 36.0 g/dL   RDW 09.8 11.9 - 14.7 %   Platelets 168 150 - 400 K/uL   nRBC 0.0 0.0 - 0.2 %    Comment: Performed at [facility], [address], [place], [place] [address]  Differential     Status: None   Collection Time: 01/08/23  2:16 PM  Result Value Ref Range   Neutrophils Relative % 54 %   Neutro Abs 3.4 1.7 - 7.7 K/uL   Lymphocytes Relative 35 %   Lymphs Abs 2.2 0.7 - 4.0 K/uL   Monocytes Relative 9 %   Monocytes Absolute 0.6 0.1 - 1.0 K/uL   Eosinophils Relative 2 %   Eosinophils Absolute 0.1 0.0 - 0.5 K/uL   Basophils Relative 0 %   Basophils Absolute  0.0 0.0 - 0.1 K/uL   Immature Granulocytes 0 %   Abs Immature Granulocytes 0.01 0.00 - 0.07 K/uL    Comment: Performed at [facility], [address], [place], [place] [address]  Resp panel by RT-PCR (RSV, Flu A&B, Covid) Anterior Nasal Swab     Status: None   Collection Time: 01/08/23  3:31 PM   Specimen: Anterior Nasal Swab  Result Value Ref Range   SARS Coronavirus 2 by RT PCR NEGATIVE NEGATIVE    Comment: (NOTE) SARS-CoV-2 target nucleic acids are NOT DETECTED.  The SARS-CoV-2 RNA is generally detectable in upper respiratory specimens during the acute phase of infection. The lowest concentration of SARS-CoV-2 viral copies this assay can detect is 138 copies/mL. A negative result does not preclude SARS-Cov-2 infection and should not be used as the sole basis for treatment or other patient management decisions. A negative result may occur with  improper specimen collection/handling, submission of specimen other than nasopharyngeal swab, presence of viral mutation(s) within the areas targeted by this assay, and inadequate number of viral copies(<138 copies/mL). A negative result must be combined with clinical observations, patient history, and epidemiological information. The expected result is Negative.  Fact Sheet for Patients:  BloggerCourse.com  Fact Sheet for Healthcare Providers:  SeriousBroker.it  This test is no t yet approved or cleared by the [place] FDA and  has been authorized for detection and/or diagnosis of SARS-CoV-2 by FDA under an Emergency Use Authorization (EUA). This EUA will remain  in effect (meaning this test can be used) for the duration of the COVID-19 declaration under Section 564(b)(1) of the Act, 21 U.S.C.section 360bbb-3(b)(1), unless the authorization is terminated  or revoked sooner.       Influenza A by PCR NEGATIVE NEGATIVE   Influenza B by PCR NEGATIVE NEGATIVE     Comment: (NOTE) The Xpert Xpress SARS-CoV-2/FLU/RSV plus assay is intended as an aid in the diagnosis of influenza from Nasopharyngeal swab specimens and should not be used as a sole basis for treatment. Nasal washings and aspirates are unacceptable for Xpert Xpress SARS-CoV-2/FLU/RSV testing.  Fact Sheet for Patients: BloggerCourse.com  Fact Sheet for Healthcare Providers: SeriousBroker.it  This test is not yet approved or cleared by the [place] FDA and has been authorized for detection and/or diagnosis of SARS-CoV-2 by FDA under an Emergency Use Authorization (EUA). This EUA will remain in effect (meaning this test can be used) for the duration of the COVID-19 declaration under Section 564(b)(1) of the Act, 21 U.S.C. section 360bbb-3(b)(1), unless  the authorization is terminated or revoked.     Resp Syncytial Virus by PCR NEGATIVE NEGATIVE    Comment: (NOTE) Fact Sheet for Patients: BloggerCourse.com  Fact Sheet for Healthcare Providers: SeriousBroker.it  This test is not yet approved or cleared by the [place] FDA and has been authorized for detection and/or diagnosis of SARS-CoV-2 by FDA under an Emergency Use Authorization (EUA). This EUA will remain in effect (meaning this test can be used) for the duration of the COVID-19 declaration under Section 564(b)(1) of the Act, 21 U.S.C. section 360bbb-3(b)(1), unless the authorization is terminated or revoked.  Performed at [facility], [address] [place], [place] [address]   Comprehensive metabolic panel     Status: Abnormal   Collection Time: 01/08/23  3:50 PM  Result Value Ref Range   Sodium 144 135 - 145 mmol/L   Potassium 3.3 (L) 3.5 - 5.1 mmol/L    Comment: HEMOLYSIS AT THIS LEVEL MAY AFFECT RESULT   Chloride 121 (H) 98 - 111 mmol/L   CO2 16 (L) 22 - 32 mmol/L   Glucose, Bld 73 70  - 99 mg/dL    Comment: Glucose reference range applies only to samples taken after fasting for at least 8 hours.   BUN 24 (H) 8 - 23 mg/dL   Creatinine, Ser 6.04 0.61 - 1.24 mg/dL   Calcium 6.2 (LL) 8.9 - 10.3 mg/dL    Comment: CRITICAL RESULT CALLED TO, READ BACK BY AND VERIFIED WITH RN P DOWD AT 1725 01/08/23 CRUICKSHANK A    Total Protein 4.2 (L) 6.5 - 8.1 g/dL   Albumin 2.1 (L) 3.5 - 5.0 g/dL   AST 14 (L) 15 - 41 U/L    Comment: HEMOLYSIS AT THIS LEVEL MAY AFFECT RESULT   ALT 10 0 - 44 U/L    Comment: HEMOLYSIS AT THIS LEVEL MAY AFFECT RESULT   Alkaline Phosphatase 37 (L) 38 - 126 U/L   Total Bilirubin 0.8 0.3 - 1.2 mg/dL    Comment: HEMOLYSIS AT THIS LEVEL MAY AFFECT RESULT   GFR, Estimated >60 >60 mL/min    Comment: (NOTE) Calculated using the CKD-EPI Creatinine Equation (2021)    Anion gap 7 5 - 15    Comment: Performed at [facility], [address], [place], [place] [address]  Dickie La [address], ED     Status: Abnormal   Collection Time: 01/08/23  3:52 PM  Result Value Ref Range   Sodium 147 (H) 135 - 145 mmol/L   Potassium 3.2 (L) 3.5 - 5.1 mmol/L   Chloride 114 (H) 98 - 111 mmol/L   BUN 27 (H) 8 - 23 mg/dL   Creatinine, Ser 1.19 0.61 - 1.24 mg/dL   Glucose, Bld 74 70 - 99 mg/dL    Comment: Glucose reference range applies only to samples taken after fasting for at least 8 hours.   Calcium, Ion 0.95 (L) 1.15 - 1.40 mmol/L   TCO2 21 (L) 22 - 32 mmol/L   Hemoglobin 9.2 (L) 13.0 - 17.0 g/dL   HCT 14.7 (L) 82.9 - 56.2 %  Urinalysis, Routine w reflex microscopic -Urine, Catheterized     Status: Abnormal   Collection Time: 01/08/23  5:00 PM  Result Value Ref Range   Color, Urine YELLOW YELLOW   APPearance CLEAR CLEAR   Specific Gravity, Urine 1.010 1.005 - 1.030   pH 6.0 5.0 - 8.0   Glucose, UA >=500 (A) NEGATIVE mg/dL   Hgb urine dipstick NEGATIVE  NEGATIVE   Bilirubin Urine NEGATIVE NEGATIVE   Ketones, ur NEGATIVE NEGATIVE mg/dL   Protein, ur  NEGATIVE NEGATIVE mg/dL   Nitrite NEGATIVE NEGATIVE   Leukocytes,Ua NEGATIVE NEGATIVE   RBC / HPF 0-5 0 - 5 RBC/hpf   WBC, UA 0-5 0 - 5 WBC/hpf   Bacteria, UA NONE SEEN NONE SEEN   Squamous Epithelial / HPF 0-5 0 - 5 /HPF   Hyaline Casts, UA PRESENT     Comment: Performed at [facility], [address], [place], [place] [address]   DG Chest Portable 1 View  Result Date: 01/08/2023 CLINICAL DATA:  Failure to thrive EXAM: PORTABLE CHEST 1 VIEW COMPARISON:  11/15/2022 FINDINGS: Cardiac size is within normal limits. There are no signs of pulmonary edema or focal pulmonary consolidation. Right hemidiaphragm is elevated. Small linear densities in lower lung fields may suggest minimal scarring or subsegmental atelectasis. Left lateral CP angle is indistinct. There is no pneumothorax. IMPRESSION: There are no signs of pulmonary edema or focal pulmonary consolidation. Left lateral CP angle is indistinct which may be due to pleural thickening or minimal effusion. Electronically Signed   By: Ernie Avena M.D.   On: 01/08/2023 15:42   CT HEAD WO CONTRAST  Result Date: 01/08/2023 CLINICAL DATA:  Neuro deficit, acute, stroke suspected. Failure to thrive. EXAM: CT HEAD WITHOUT CONTRAST TECHNIQUE: Contiguous axial images were obtained from the base of the skull through the vertex without intravenous contrast. RADIATION DOSE REDUCTION: This exam was performed according to the departmental dose-optimization program which includes automated exposure control, adjustment of the mA and/or kV according to patient size and/or use of iterative reconstruction technique. COMPARISON:  Head CT 11/15/2022 and MRI 11/16/2022 FINDINGS: Brain: There is no evidence of an acute infarct, intracranial hemorrhage, mass, midline shift, or extra-axial fluid collection. Patchy to confluent hypodensities in the cerebral white matter bilaterally are similar to the prior CT and are nonspecific but compatible with  moderately advanced chronic small vessel ischemic disease. Chronic lacunar infarcts are again noted in the thalami and pons. Mild cerebral atrophy is within normal limits for age. Vascular: Calcified atherosclerosis at the skull base. No hyperdense vessel. Skull: No acute fracture or suspicious osseous lesion. Sinuses/Orbits: Small mucous retention cysts in the left maxillary and left sphenoid sinuses. Clear mastoid air cells. Bilateral cataract extraction. Other: None. IMPRESSION: 1. No evidence of acute intracranial abnormality. 2. 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 Electronically Signed   By: Sebastian Ache M.D.   On: 01/08/2023 15:42    Pending Labs Unresulted Labs (From admission, onward)     Start     Ordered   01/08/23 1925  Magnesium  ONCE - URGENT,   URGENT        01/08/23 1924            Vitals/Pain Today's Vitals   01/08/23 1915 01/08/23 1930 01/08/23 1945 01/08/23 1957  BP: (!) 154/55 (!) 155/62 (!) 156/79 (!) 154/70  Pulse: (!) 44 (!) 44 (!) 39 64  Resp: 13 16 12 15   Temp:    98.1 F (36.7 C)  TempSrc:      SpO2: 98% 100% 97% 99%  Height:        Isolation Precautions No active isolations  Medications Medications  sodium chloride 0.9 % bolus 500 mL (0 mLs Intravenous Stopped 01/08/23 1606)    Followed by  0.9 %  sodium chloride infusion (100 mL/hr Intravenous New Bag/Given 01/08/23 1753)  calcium gluconate 1 g/ 50  mL sodium chloride IVPB (0 mg Intravenous Stopped 01/08/23 1844)    Mobility non-ambulatory

## 2023-01-08 NOTE — ED Triage Notes (Signed)
Patient brought in by EMS from [facility] with c/o failure to thrive. Per EMS patient wife said he is easy to arouse but today it is more difficult to arouse patient.  CBG:124 97% RA 130/90 48 22g in L forearm

## 2023-01-08 NOTE — ED Notes (Signed)
Labeled urine specimen and culture sent to lab. ENMiles 

## 2023-01-08 NOTE — Assessment & Plan Note (Signed)
-  continue finasteride and tamsulosin

## 2023-01-08 NOTE — Assessment & Plan Note (Signed)
-  A1c 6.4% on 11/16/22 -no hyperglycemia. Monitor without SSI for now.

## 2023-01-08 NOTE — Assessment & Plan Note (Signed)
-  this was also present at recent admission. Asymptomatic.

## 2023-01-08 NOTE — Assessment & Plan Note (Signed)
-  wife reports pt having aspiration with solids recently and facility has been putting him on pureed diet. Has scheduled swallowing study coming up.  -will get speech to evaluate here -keep on dysphagic diet for now

## 2023-01-09 ENCOUNTER — Encounter (HOSPITAL_COMMUNITY): Payer: Self-pay | Admitting: Family Medicine

## 2023-01-09 DIAGNOSIS — E7800 Pure hypercholesterolemia, unspecified: Secondary | ICD-10-CM | POA: Diagnosis present

## 2023-01-09 DIAGNOSIS — I129 Hypertensive chronic kidney disease with stage 1 through stage 4 chronic kidney disease, or unspecified chronic kidney disease: Secondary | ICD-10-CM | POA: Diagnosis present

## 2023-01-09 DIAGNOSIS — Z1152 Encounter for screening for COVID-19: Secondary | ICD-10-CM | POA: Diagnosis not present

## 2023-01-09 DIAGNOSIS — E1169 Type 2 diabetes mellitus with other specified complication: Secondary | ICD-10-CM | POA: Diagnosis not present

## 2023-01-09 DIAGNOSIS — I700 Atherosclerosis of aorta: Secondary | ICD-10-CM | POA: Diagnosis present

## 2023-01-09 DIAGNOSIS — Z7189 Other specified counseling: Secondary | ICD-10-CM | POA: Diagnosis not present

## 2023-01-09 DIAGNOSIS — R627 Adult failure to thrive: Secondary | ICD-10-CM | POA: Diagnosis not present

## 2023-01-09 DIAGNOSIS — J449 Chronic obstructive pulmonary disease, unspecified: Secondary | ICD-10-CM | POA: Diagnosis present

## 2023-01-09 DIAGNOSIS — E11319 Type 2 diabetes mellitus with unspecified diabetic retinopathy without macular edema: Secondary | ICD-10-CM | POA: Diagnosis present

## 2023-01-09 DIAGNOSIS — Z7982 Long term (current) use of aspirin: Secondary | ICD-10-CM | POA: Diagnosis not present

## 2023-01-09 DIAGNOSIS — E876 Hypokalemia: Secondary | ICD-10-CM | POA: Diagnosis present

## 2023-01-09 DIAGNOSIS — Z66 Do not resuscitate: Secondary | ICD-10-CM | POA: Diagnosis present

## 2023-01-09 DIAGNOSIS — E440 Moderate protein-calorie malnutrition: Secondary | ICD-10-CM | POA: Diagnosis present

## 2023-01-09 DIAGNOSIS — F0390 Unspecified dementia without behavioral disturbance: Secondary | ICD-10-CM

## 2023-01-09 DIAGNOSIS — R4589 Other symptoms and signs involving emotional state: Secondary | ICD-10-CM

## 2023-01-09 DIAGNOSIS — F32A Depression, unspecified: Secondary | ICD-10-CM | POA: Diagnosis present

## 2023-01-09 DIAGNOSIS — N1832 Chronic kidney disease, stage 3b: Secondary | ICD-10-CM

## 2023-01-09 DIAGNOSIS — E1122 Type 2 diabetes mellitus with diabetic chronic kidney disease: Secondary | ICD-10-CM | POA: Diagnosis present

## 2023-01-09 DIAGNOSIS — N400 Enlarged prostate without lower urinary tract symptoms: Secondary | ICD-10-CM | POA: Diagnosis present

## 2023-01-09 DIAGNOSIS — Z87891 Personal history of nicotine dependence: Secondary | ICD-10-CM | POA: Diagnosis not present

## 2023-01-09 DIAGNOSIS — R001 Bradycardia, unspecified: Secondary | ICD-10-CM | POA: Diagnosis present

## 2023-01-09 DIAGNOSIS — F0393 Unspecified dementia, unspecified severity, with mood disturbance: Secondary | ICD-10-CM | POA: Diagnosis present

## 2023-01-09 DIAGNOSIS — Z515 Encounter for palliative care: Secondary | ICD-10-CM | POA: Diagnosis not present

## 2023-01-09 DIAGNOSIS — Z7984 Long term (current) use of oral hypoglycemic drugs: Secondary | ICD-10-CM | POA: Diagnosis not present

## 2023-01-09 DIAGNOSIS — F03911 Unspecified dementia, unspecified severity, with agitation: Secondary | ICD-10-CM | POA: Diagnosis not present

## 2023-01-09 DIAGNOSIS — Z8673 Personal history of transient ischemic attack (TIA), and cerebral infarction without residual deficits: Secondary | ICD-10-CM | POA: Diagnosis not present

## 2023-01-09 DIAGNOSIS — E860 Dehydration: Secondary | ICD-10-CM | POA: Diagnosis present

## 2023-01-09 LAB — BASIC METABOLIC PANEL
Anion gap: 10 (ref 5–15)
BUN: 29 mg/dL — ABNORMAL HIGH (ref 8–23)
CO2: 22 mmol/L (ref 22–32)
Calcium: 9.5 mg/dL (ref 8.9–10.3)
Chloride: 112 mmol/L — ABNORMAL HIGH (ref 98–111)
Creatinine, Ser: 1.6 mg/dL — ABNORMAL HIGH (ref 0.61–1.24)
GFR, Estimated: 42 mL/min — ABNORMAL LOW (ref >60)
Glucose, Bld: 73 mg/dL (ref 70–99)
Potassium: 4.4 mmol/L (ref 3.5–5.1)
Sodium: 144 mmol/L (ref 135–145)

## 2023-01-09 LAB — TSH: TSH: 0.907 u[IU]/mL (ref 0.350–4.500)

## 2023-01-09 LAB — VITAMIN B12: Vitamin B-12: 803 pg/mL (ref 180–914)

## 2023-01-09 MED ORDER — OLANZAPINE 10 MG IM SOLR
2.5000 mg | Freq: Once | INTRAMUSCULAR | Status: AC | PRN
  Administered 2023-01-09: 2.5 mg via INTRAMUSCULAR
  Filled 2023-01-09: qty 10

## 2023-01-09 MED ORDER — SODIUM CHLORIDE 0.9 % IV SOLN
100.0000 mL/h | INTRAVENOUS | Status: AC
  Administered 2023-01-09: 20 mL/h via INTRAVENOUS
  Administered 2023-01-09: 100 mL/h via INTRAVENOUS

## 2023-01-09 MED ORDER — MAGNESIUM OXIDE -MG SUPPLEMENT 400 (240 MG) MG PO TABS
400.0000 mg | ORAL_TABLET | Freq: Two times a day (BID) | ORAL | Status: AC
  Administered 2023-01-09 (×2): 400 mg via ORAL
  Filled 2023-01-09 (×2): qty 1

## 2023-01-09 MED ORDER — POLYETHYLENE GLYCOL 3350 17 G PO PACK
17.0000 g | PACK | Freq: Two times a day (BID) | ORAL | Status: AC
  Administered 2023-01-09 – 2023-01-10 (×4): 17 g via ORAL
  Filled 2023-01-09 (×4): qty 1

## 2023-01-09 NOTE — Progress Notes (Signed)
Mobility Specialist - Progress Note   01/09/23 1521  Mobility  Activity Ambulated with assistance in hallway  Level of Assistance Minimal assist, patient does 75% or more  Assistive Device Front wheel walker  Distance Ambulated (ft) 40 ft  Activity Response Tolerated fair  Mobility Referral Yes  $Mobility charge 1 Mobility  Mobility Specialist Start Time (ACUTE ONLY) 0255  Mobility Specialist Stop Time (ACUTE ONLY) 0311  Mobility Specialist Time Calculation (min) (ACUTE ONLY) 16 min   Pt received in recliner and agreeable to mobility. Pt was MinA from STS. Pt required several verbal cues for walker mobility & direction. At EOS pt grew fatigued requiring extra assistance including having to be wheeled back to room in recliner. No complaints during session. Pt to recliner after session with all needs met & wife in room.   [facility]

## 2023-01-09 NOTE — Progress Notes (Signed)
The  patient is confused and pulling at his IV line, trying to get out of bed. Paged Chinita Greenland for agitation medication.

## 2023-01-09 NOTE — Evaluation (Signed)
SLP Cancellation Note  Patient Details Name: Ronald Dyer MRN: [id number] DOB: [birth year]   Cancelled treatment:       Reason Eval/Treat Not Completed: Other (comment) (pt with mobility team staff when SLP attempted to see pt at approx 1520, will continue efforts) [name], MS [facility] SLP Acute Rehab Services Office [phone]   [name] 01/09/2023, 4:27 PM

## 2023-01-09 NOTE — Progress Notes (Addendum)
[facility] PROGRESS NOTE    Progress Note  Ronald Dyer  UJW:[id number] DOB: 19-Jul-1941 DOA: 01/08/2023 PCP: Sigmund Hazel, MD     Brief Narrative:   Ronald Dyer is an 82 y.o. male past medical history significant for advanced Mancia, chronic NEC stage IIIb, CVA, essential hypertension history of colon cancer with right hemicolectomy comes in for altered mental status    Assessment/Plan:   Hypocalcemia: Twelve-lead EKG showed QTc 425. Was given IV calcium, this morning is 9.5. His magnesium is 1.4 was given magnesium recheck tomorrow morning.  Chronic kidney disease, stage 3b (HCC) With a baseline creatinine 1.5-1.7, on admission 1.6.  That hemoglobin of 1.1 and 1.2 is likely erroneous as her sodium potassium chloride and bicarb are off too. KVO IV fluids once able to take orals.  Failure to thrive: Wife reports he is having recurrent aspiration episodes to solids and liquids as an outpatient. Speech therapy have been consulted. Will be a good idea for family to meet with palliative care.  History of CVA: Continue aspirin and Lipitor.  COPD: Continue inhalers.  Sinus bradycardia: Asymptomatic continue to monitor.  BPH: Continue finasteride and Flomax.  Diabetes mellitus type 2: A1c of 6.4, probably does not require insulin stop checking CBGs.  DVT prophylaxis: lovenox Family Communication:none Status is: Observation The patient remains OBS appropriate and will d/c before 2 midnights.    Code Status:     Code Status Orders  (From admission, onward)           Start     Ordered   01/08/23 2025  Do not attempt resuscitation (DNR)  Continuous       Question Answer Comment  If patient has no pulse and is not breathing Do Not Attempt Resuscitation   If patient has a pulse and/or is breathing: Medical Treatment Goals LIMITED ADDITIONAL INTERVENTIONS: Use medication/IV fluids and cardiac monitoring as indicated; Do not use intubation or  mechanical ventilation (DNI), also provide comfort medications.  Transfer to Progressive/Stepdown as indicated, avoid Intensive Care.   Consent: Discussion documented in EHR or advanced directives reviewed      01/08/23 2024           Code Status History     Date Active Date Inactive Code Status Order ID Comments User Context   11/16/2022 0056 11/23/2022 0019 DNR [id number]  John Giovanni, MD ED   11/12/2021 2228 11/17/2021 1757 DNR [id number]  Eduard Clos, MD Inpatient   02/04/2020 0143 02/06/2020 2021 Full Code [id number]  John Giovanni, MD ED      Advance Directive Documentation    Flowsheet Row Most Recent Value  Type of Advance Directive Out of facility DNR (pink MOST or yellow form)  Pre-existing out of facility DNR order (yellow form or pink MOST form) --  "MOST" Form in Place? --         IV Access:   Peripheral IV   Procedures and diagnostic studies:   DG Chest Portable 1 View  Result Date: 01/08/2023 CLINICAL DATA:  Failure to thrive EXAM: PORTABLE CHEST 1 VIEW COMPARISON:  11/15/2022 FINDINGS: Cardiac size is within normal limits. There are no signs of pulmonary edema or focal pulmonary consolidation. Right hemidiaphragm is elevated. Small linear densities in lower lung fields may suggest minimal scarring or subsegmental atelectasis. Left lateral CP angle is indistinct. There is no pneumothorax. IMPRESSION: There are no signs of pulmonary edema or focal pulmonary consolidation. Left lateral CP angle is indistinct which may be due to  pleural thickening or minimal effusion. Electronically Signed   By: Ernie Avena M.D.   On: 01/08/2023 15:42   CT HEAD WO CONTRAST  Result Date: 01/08/2023 CLINICAL DATA:  Neuro deficit, acute, stroke suspected. Failure to thrive. EXAM: CT HEAD WITHOUT CONTRAST TECHNIQUE: Contiguous axial images were obtained from the base of the skull through the vertex without intravenous contrast. RADIATION DOSE REDUCTION: This exam  was performed according to the departmental dose-optimization program which includes automated exposure control, adjustment of the mA and/or kV according to patient size and/or use of iterative reconstruction technique. COMPARISON:  Head CT 11/15/2022 and MRI 11/16/2022 FINDINGS: Brain: There is no evidence of an acute infarct, intracranial hemorrhage, mass, midline shift, or extra-axial fluid collection. Patchy to confluent hypodensities in the cerebral white matter bilaterally are similar to the prior CT and are nonspecific but compatible with moderately advanced chronic small vessel ischemic disease. Chronic lacunar infarcts are again noted in the thalami and pons. Mild cerebral atrophy is within normal limits for age. Vascular: Calcified atherosclerosis at the skull base. No hyperdense vessel. Skull: No acute fracture or suspicious osseous lesion. Sinuses/Orbits: Small mucous retention cysts in the left maxillary and left sphenoid sinuses. Clear mastoid air cells. Bilateral cataract extraction. Other: None. IMPRESSION: 1. No evidence of acute intracranial abnormality. 2. 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 Electronically Signed   By: Sebastian Ache M.D.   On: 01/08/2023 15:42     Medical Consultants:   None.   Subjective:    Ronald Dyer no complaints  Objective:    Vitals:   01/08/23 2247 01/09/23 0153 01/09/23 0322 01/09/23 0533  BP: (!) 159/100 (!) 147/105  (!) 149/77  Pulse: 73 65  77  Resp: 16 17  16   Temp: 98.3 F (36.8 C) 98.2 F (36.8 C)  98.8 F (37.1 C)  TempSrc: Oral Oral  Oral  SpO2: 97% 98%  97%  Weight:   79.5 kg   Height:   6' (1.829 m)    SpO2: 97 %   Intake/Output Summary (Last 24 hours) at 01/09/2023 0821 Last data filed at 01/09/2023 0541 Gross per 24 hour  Intake 2681.48 ml  Output 800 ml  Net 1881.48 ml   Filed Weights   01/09/23 0322  Weight: 79.5 kg    Exam: General exam: In no acute distress. Respiratory system: Good  air movement and clear to auscultation. Cardiovascular system: S1 & S2 heard, RRR. No JVD, murmurs, rubs, gallops or clicks.  Gastrointestinal system: Abdomen is nondistended, soft and nontender.  Extremities: No pedal edema. Skin: No rashes, lesions or ulcers Psychiatry: No judgment or insight of medical condition.   Data Reviewed:    Labs: Basic Metabolic Panel: Recent Labs  Lab 01/08/23 1550 01/08/23 1552 01/09/23 0348  NA 144 147* 144  K 3.3* 3.2* 4.4  CL 121* 114* 112*  CO2 16*  --  22  GLUCOSE 73 74 73  BUN 24* 27* 29*  CREATININE 1.16 1.20 1.60*  CALCIUM 6.2*  --  9.5  MG 1.4*  --   --    GFR Estimated Creatinine Clearance: 38.4 mL/min (A) (by C-G formula based on SCr of 1.6 mg/dL (H)). Liver Function Tests: Recent Labs  Lab 01/08/23 1550  AST 14*  ALT 10  ALKPHOS 37*  BILITOT 0.8  PROT 4.2*  ALBUMIN 2.1*   No results for input(s): "LIPASE", "AMYLASE" in the last 168 hours. No results for input(s): "AMMONIA" in the last 168 hours. Coagulation profile No  results for input(s): "INR", "PROTIME" in the last 168 hours. COVID-19 Labs  No results for input(s): "DDIMER", "FERRITIN", "LDH", "CRP" in the last 72 hours.  Lab Results  Component Value Date   SARSCOV2NAA NEGATIVE 01/08/2023   SARSCOV2NAA NEGATIVE 11/15/2022   SARSCOV2NAA NEGATIVE 11/13/2021   SARSCOV2NAA NEGATIVE 09/01/2021    CBC: Recent Labs  Lab 01/08/23 1416 01/08/23 1552  WBC 6.3  --   NEUTROABS 3.4  --   HGB 12.7* 9.2*  HCT 39.5 27.0*  MCV 82.6  --   PLT 168  --    Cardiac Enzymes: No results for input(s): "CKTOTAL", "CKMB", "CKMBINDEX", "TROPONINI" in the last 168 hours. BNP (last 3 results) No results for input(s): "PROBNP" in the last 8760 hours. CBG: No results for input(s): "GLUCAP" in the last 168 hours. D-Dimer: No results for input(s): "DDIMER" in the last 72 hours. Hgb A1c: No results for input(s): "HGBA1C" in the last 72 hours. Lipid Profile: No results for  input(s): "CHOL", "HDL", "LDLCALC", "TRIG", "CHOLHDL", "LDLDIRECT" in the last 72 hours. Thyroid function studies: No results for input(s): "TSH", "T4TOTAL", "T3FREE", "THYROIDAB" in the last 72 hours.  Invalid input(s): "FREET3" Anemia work up: No results for input(s): "VITAMINB12", "FOLATE", "FERRITIN", "TIBC", "IRON", "RETICCTPCT" in the last 72 hours. Sepsis Labs: Recent Labs  Lab 01/08/23 1416  WBC 6.3   Microbiology Recent Results (from the past 240 hour(s))  Resp panel by RT-PCR (RSV, Flu A&B, Covid) Anterior Nasal Swab     Status: None   Collection Time: 01/08/23  3:31 PM   Specimen: Anterior Nasal Swab  Result Value Ref Range Status   SARS Coronavirus 2 by RT PCR NEGATIVE NEGATIVE Final    Comment: (NOTE) SARS-CoV-2 target nucleic acids are NOT DETECTED.  The SARS-CoV-2 RNA is generally detectable in upper respiratory specimens during the acute phase of infection. The lowest concentration of SARS-CoV-2 viral copies this assay can detect is 138 copies/mL. A negative result does not preclude SARS-Cov-2 infection and should not be used as the sole basis for treatment or other patient management decisions. A negative result may occur with  improper specimen collection/handling, submission of specimen other than nasopharyngeal swab, presence of viral mutation(s) within the areas targeted by this assay, and inadequate number of viral copies(<138 copies/mL). A negative result must be combined with clinical observations, patient history, and epidemiological information. The expected result is Negative.  Fact Sheet for Patients:  BloggerCourse.com  Fact Sheet for Healthcare Providers:  SeriousBroker.it  This test is no t yet approved or cleared by the [place] FDA and  has been authorized for detection and/or diagnosis of SARS-CoV-2 by FDA under an Emergency Use Authorization (EUA). This EUA will remain  in effect  (meaning this test can be used) for the duration of the COVID-19 declaration under Section 564(b)(1) of the Act, 21 U.S.C.section 360bbb-3(b)(1), unless the authorization is terminated  or revoked sooner.       Influenza A by PCR NEGATIVE NEGATIVE Final   Influenza B by PCR NEGATIVE NEGATIVE Final    Comment: (NOTE) The Xpert Xpress SARS-CoV-2/FLU/RSV plus assay is intended as an aid in the diagnosis of influenza from Nasopharyngeal swab specimens and should not be used as a sole basis for treatment. Nasal washings and aspirates are unacceptable for Xpert Xpress SARS-CoV-2/FLU/RSV testing.  Fact Sheet for Patients: BloggerCourse.com  Fact Sheet for Healthcare Providers: SeriousBroker.it  This test is not yet approved or cleared by the [place] FDA and has been authorized for detection and/or  diagnosis of SARS-CoV-2 by FDA under an Emergency Use Authorization (EUA). This EUA will remain in effect (meaning this test can be used) for the duration of the COVID-19 declaration under Section 564(b)(1) of the Act, 21 U.S.C. section 360bbb-3(b)(1), unless the authorization is terminated or revoked.     Resp Syncytial Virus by PCR NEGATIVE NEGATIVE Final    Comment: (NOTE) Fact Sheet for Patients: BloggerCourse.com  Fact Sheet for Healthcare Providers: SeriousBroker.it  This test is not yet approved or cleared by the [place] FDA and has been authorized for detection and/or diagnosis of SARS-CoV-2 by FDA under an Emergency Use Authorization (EUA). This EUA will remain in effect (meaning this test can be used) for the duration of the COVID-19 declaration under Section 564(b)(1) of the Act, 21 U.S.C. section 360bbb-3(b)(1), unless the authorization is terminated or revoked.  Performed at [facility], [address], [place], [place] [address]       Medications:    aspirin  81 mg Oral QODAY   atorvastatin  10 mg Oral QODAY   cholecalciferol  3,000 Units Oral Daily   enoxaparin (LOVENOX) injection  40 mg Subcutaneous Q24H   finasteride  5 mg Oral Daily   hydrochlorothiazide  25 mg Oral q morning   lisinopril  20 mg Oral Daily   pantoprazole  40 mg Oral Daily   sertraline  100 mg Oral Daily   tamsulosin  0.4 mg Oral Daily   vitamin B-12  100 mcg Oral Daily   Continuous Infusions:  sodium chloride 100 mL/hr (01/09/23 0702)      LOS: 0 days   Marinda Elk  [facility]  01/09/2023, 8:21 AM

## 2023-01-09 NOTE — Consult Note (Signed)
Consultation Note Date: 01/09/2023   Patient Name: Ronald Dyer  DOB: 1940-11-28  MRN: [id number]  Age / Sex: 82 y.o., male   PCP: Sigmund Hazel, MD Referring Physician: David Stall, Darin Engels, MD  Reason for Consultation: Establishing goals of care     Chief Complaint/History of Present Illness:   Patient is an 82 year old male with a past medical history of dementia, CKD stage IIIb, CVA, hypertension, COPD, type 2 diabetes mellitus, BPH, hyperlipidemia, and colon cancer status post right hemicolectomy who was admitted on 01/08/2023 for management of altered mental status.  Patient has been a resident at [facility] since March.  Since admission, patient has received management for hypocalcemia.  Imaging did not note any acute changes.  Palliative medicine team consulted to assist with complex medical decision making.  Reviewed EMR prior to presenting to bedside.  Also discussed care with [facility] the [facility], Norm Parcel, assault patient had been evaluated for inpatient hospice (denied on 4/1 due to not being medically appropriate for IPU) and their team had been following patient at home prior to his going to SNF facility.  Ms. Kyung Rudd informed this provider that there hospice is not contracted with [place] and so they were not able to continue following patient in the facility.  Presented to bedside to meet with patient.  Patient resting comfortably in bed.  Patient's wife, Ronald Dyer, present at bedside.  Able to introduce myself and the role of the palliative medicine team.  Patient's wife updated me about his sudden change in mental status which prompted her to bring him to the hospital.  She feels that since he has been in the hospital, he has greatly improved.  She notes its "night and day".  She notes patient has essentially been comatose for the past 3 days though today patient is laughing and eating breakfast and participating in conversations.  Wife notes improved oral intake today  stating patient had been on a pured diet at facility.  Spent time reviewing waxing and waning status of dementia and worsening of things such as infections and aspiration risk.   Spent time providing emotional support via active listening.  Reminisced help patient was always a people person and greatly enjoyed arts and crafts.  Patient has been diagnosed with dementia for a few years now and it continues to progress.  Wife acknowledges this and that dementia is a progressive disease that will continue to worsen.  Wife hoping patient can return to [facility] to continue receiving skilled nursing support there.  Wife inquiring about discharge planning as patient has bed hold for a few days.  Noted would reach out to hospitalist and team to assist with coordination of this care.  Wife acknowledges that they had support at home through [facility] [place] prior to patient going into facility.  Wife found their involvement very helpful for patient.  Acknowledged this and noted would inform [facility] [place] liaison about conversation to update them.  Physical therapy then came to bedside to evaluate patient.  Noted to wife would reach out to team members to assist in coordination of care to get wife's questions answered regarding discharge planning and support.  Wife voiced appreciation for the visit today.  All questions answered at that time.  Noted palliative medicine team will continue to follow.  Updated care team after visit with patient and his wife.  Primary Diagnoses  Present on Admission:  Hypocalcemia  Chronic kidney disease, stage 3b (HCC)  BPH (benign prostatic hyperplasia)  Sinus bradycardia   Palliative Review of Systems: No symptoms of concern noted  Past Medical History:  Diagnosis Date   Atherosclerosis of aorta (HCC)    CKD (chronic kidney disease), stage III (HCC)    COPD (chronic obstructive pulmonary disease) (HCC)    Depression    Diabetes mellitus without  complication (HCC)    Diabetic retinopathy (HCC)    Frequent falls    GERD (gastroesophageal reflux disease)    History of rhabdomyolysis    Hypercholesterolemia    Hypertension    Memory loss    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, Dr Clelia Croft d/c 06/2014    Stroke [facility])    Social History   Socioeconomic History   Marital status: Married    Spouse name: Not on file   Number of children: Not on file   Years of education: Not on file   Highest education level: Not on file  Occupational History   Not on file  Tobacco Use   Smoking status: Former    Years: 40    Types: Cigarettes    Quit date: 09/08/1998    Years since quitting: 24.3   Smokeless tobacco: Former  Building services engineer Use: Never used  Substance and Sexual Activity   Alcohol use: No   Drug use: No   Sexual activity: Never  Other Topics Concern   Not on file  Social History Narrative   Not on file   Social Determinants of Health   Financial Resource Strain: Low Risk  (03/28/2019)   Overall Financial Resource Strain (CARDIA)    Difficulty of Paying Living Expenses: Not hard at all  Food Insecurity: No Food Insecurity (11/16/2022)   Hunger Vital Sign    Worried About Running Out of Food in the Last Year: Never true    Ran Out of Food in the Last Year: Never true  Transportation Needs: No Transportation Needs (11/16/2022)   PRAPARE - Administrator, Civil Service (Medical): No    Lack of Transportation (Non-Medical): No  Physical Activity: Inactive (02/19/2019)   Exercise Vital Sign    Days of Exercise per Week: 0 days    Minutes of Exercise per Session: 0 min  Stress: Not on file  Social Connections: Not on file   Family History  Problem Relation Age of Onset   Cancer Father    Scheduled Meds:  aspirin  81 mg Oral QODAY   atorvastatin  10 mg Oral QODAY   cholecalciferol  3,000 Units Oral Daily   enoxaparin (LOVENOX) injection  40 mg Subcutaneous Q24H    finasteride  5 mg Oral Daily   hydrochlorothiazide  25 mg Oral q morning   lisinopril  20 mg Oral Daily   magnesium oxide  400 mg Oral BID   pantoprazole  40 mg Oral Daily   polyethylene glycol  17 g Oral BID   sertraline  100 mg Oral Daily   tamsulosin  0.4 mg Oral Daily   vitamin B-12  100 mcg Oral Daily   Continuous Infusions:  sodium chloride     PRN Meds:. Allergies  Allergen Reactions   Metformin Hcl Diarrhea and Other (See Comments)     "Allergic," per paperwork from facility   Simvastatin Other (See Comments)    High CPK and "Allergic," per paperwork from facility   CBC:    Component Value Date/Time   WBC 6.3 01/08/2023 1416   HGB  9.2 (L) 01/08/2023 1552   HGB 14.3 07/01/2014 1000   HCT 27.0 (L) 01/08/2023 1552   HCT 44.1 07/01/2014 1000   PLT 168 01/08/2023 1416   PLT 173 07/01/2014 1000   MCV 82.6 01/08/2023 1416   MCV 83.8 07/01/2014 1000   NEUTROABS 3.4 01/08/2023 1416   NEUTROABS 4.5 07/01/2014 1000   LYMPHSABS 2.2 01/08/2023 1416   LYMPHSABS 1.8 07/01/2014 1000   MONOABS 0.6 01/08/2023 1416   MONOABS 0.8 07/01/2014 1000   EOSABS 0.1 01/08/2023 1416   EOSABS 0.1 07/01/2014 1000   BASOSABS 0.0 01/08/2023 1416   BASOSABS 0.1 07/01/2014 1000   Comprehensive Metabolic Panel:    Component Value Date/Time   NA 144 01/09/2023 0348   NA 140 07/01/2014 1000   K 4.4 01/09/2023 0348   K 4.1 07/01/2014 1000   CL 112 (H) 01/09/2023 0348   CL 107 12/24/2012 1009   CO2 22 01/09/2023 0348   CO2 25 07/01/2014 1000   BUN 29 (H) 01/09/2023 0348   BUN 8.4 07/01/2014 1000   CREATININE 1.60 (H) 01/09/2023 0348   CREATININE 1.1 07/01/2014 1000   GLUCOSE 73 01/09/2023 0348   GLUCOSE 121 07/01/2014 1000   GLUCOSE 107 (H) 12/24/2012 1009   CALCIUM 9.5 01/09/2023 0348   CALCIUM 10.2 07/01/2014 1000   AST 14 (L) 01/08/2023 1550   AST 28 07/01/2014 1000   ALT 10 01/08/2023 1550   ALT 30 07/01/2014 1000   ALKPHOS 37 (L) 01/08/2023 1550   ALKPHOS 58 07/01/2014 1000    BILITOT 0.8 01/08/2023 1550   BILITOT 0.48 07/01/2014 1000   PROT 4.2 (L) 01/08/2023 1550   PROT 7.1 07/01/2014 1000   ALBUMIN 2.1 (L) 01/08/2023 1550   ALBUMIN 3.9 07/01/2014 1000    Physical Exam: Vital Signs: BP (!) 149/77 (BP Location: Right Arm)   Pulse 77   Temp 98.8 F (37.1 C) (Oral)   Resp 16   Ht 6' (1.829 m)   Wt 79.5 kg   SpO2 97%   BMI 23.77 kg/m  SpO2: SpO2: 97 % O2 Device: O2 Device: Room Air O2 Flow Rate:   Intake/output summary:  Intake/Output Summary (Last 24 hours) at 01/09/2023 0912 Last data filed at 01/09/2023 0541 Gross per 24 hour  Intake 2681.48 ml  Output 800 ml  Net 1881.48 ml   LBM:   Baseline Weight: Weight: 79.5 kg Most recent weight: Weight: 79.5 kg  General: NAD, resting comfortably in bed without nonverbal signs of pain, chronically ill-appearing Eyes: No drainage noted HENT: moist mucous membranes Cardiovascular: RRR Respiratory: no increased work of breathing noted, not in respiratory distress Abdomen: not distended Skin: no rashes or lesions on visible skin Neuro: Resting comfortably, known history of dementia          Palliative Performance Scale: 40%              Additional Data Reviewed: Recent Labs    01/08/23 1416 01/08/23 1550 01/08/23 1552 01/09/23 0348  WBC 6.3  --   --   --   HGB 12.7*  --  9.2*  --   PLT 168  --   --   --   NA  --    < > 147* 144  BUN  --    < > 27* 29*  CREATININE  --    < > 1.20 1.60*   < > = values in this interval not displayed.    Imaging: DG Chest Portable 1 View CLINICAL DATA:  Failure to thrive  EXAM: PORTABLE CHEST 1 VIEW  COMPARISON:  11/15/2022  FINDINGS: Cardiac size is within normal limits. There are no signs of pulmonary edema or focal pulmonary consolidation. Right hemidiaphragm is elevated. Small linear densities in lower lung fields may suggest minimal scarring or subsegmental atelectasis. Left lateral CP angle is indistinct. There is no  pneumothorax.  IMPRESSION: There are no signs of pulmonary edema or focal pulmonary consolidation. Left lateral CP angle is indistinct which may be due to pleural thickening or minimal effusion.  Electronically Signed   By: Ernie Avena M.D.   On: 01/08/2023 15:42 CT HEAD WO CONTRAST CLINICAL DATA:  Neuro deficit, acute, stroke suspected. Failure to thrive.  EXAM: CT HEAD WITHOUT CONTRAST  TECHNIQUE: Contiguous axial images were obtained from the base of the skull through the vertex without intravenous contrast.  RADIATION DOSE REDUCTION: This exam was performed according to the departmental dose-optimization program which includes automated exposure control, adjustment of the mA and/or kV according to patient size and/or use of iterative reconstruction technique.  COMPARISON:  Head CT 11/15/2022 and MRI 11/16/2022  FINDINGS: Brain: There is no evidence of an acute infarct, intracranial hemorrhage, mass, midline shift, or extra-axial fluid collection. Patchy to confluent hypodensities in the cerebral white matter bilaterally are similar to the prior CT and are nonspecific but compatible with moderately advanced chronic small vessel ischemic disease. Chronic lacunar infarcts are again noted in the thalami and pons. Mild cerebral atrophy is within normal limits for age.  Vascular: Calcified atherosclerosis at the skull base. No hyperdense vessel.  Skull: No acute fracture or suspicious osseous lesion.  Sinuses/Orbits: Small mucous retention cysts in the left maxillary and left sphenoid sinuses. Clear mastoid air cells. Bilateral cataract extraction.  Other: None.  IMPRESSION: 1. No evidence of acute intracranial abnormality. 2. 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  Electronically Signed   By: Sebastian Ache M.D.   On: 01/08/2023 15:42    I personally reviewed recent imaging.   Palliative Care Assessment and Plan Summary of  Established Goals of Care and Medical Treatment Preferences   Patient is an 82 year old male with a past medical history of dementia, CKD stage IIIb, CVA, hypertension, COPD, type 2 diabetes mellitus, BPH, hyperlipidemia, and colon cancer status post right hemicolectomy who was admitted on 01/08/2023 for management of altered mental status.  Patient has been a resident at [facility] since March.  Since admission, patient has received management for hypocalcemia.  Imaging did not note any acute changes.  Palliative medicine team consulted to assist with complex medical decision making.  # Complex medical decision making/goals of care  -Patient unable to participate in complex medical decision-making due to underlying medical status.  -Discussed care with patient's wife at bedside.  Patient has been residing at [facility] facility.  Wife notes waxing and waning participation with activities there.  Wife feels that patient's mental status has improved since presenting to the hospital which she is appreciative of seeing.  Wife planning for patient to return to long-term care.  -Patient had previously received home palliative services through [facility] [place].  Reached out to hospice liaison who noted they do not contract with patient's current facility.  Placed TOC consult to assist with referral to another outpatient palliative team to continue supporting discussions and wife moving forward with patient's progressive underlying disease, dementia.  Wife voiced knowing patient's dementia we will continue to worsen over time leading to more infections, worsening swallowing.   -  Code Status: DNR   # Symptom management  -As per primary hospitalist.   # Psycho-social/Spiritual Support:  - Support System: wife  # Discharge Planning:  LTC facility with outpatient palliative  -Previously had palliative support through [facility] [place] though they do not contract with patient's current facility. May need  another outpatient palliative referral.   Thank you for allowing the palliative care team to participate in the care [facility].  Alvester Morin, DO Palliative Care Provider PMT # [phone]  If patient remains symptomatic despite maximum doses, please call PMT at [phone] between 0700 and 1900. Outside of these hours, please call attending, as PMT does not have night coverage.  This provider spent a total of 80 minutes providing patient's care.  Includes review of EMR, discussing care with other staff members involved in patient's medical care, obtaining relevant history and information from patient and/or patient's family, and personal review of imaging and lab work. Greater than 50% of the time was spent counseling and coordinating care related to the above assessment and plan.    *Please note that this is a verbal dictation therefore any spelling or grammatical errors are due to the "Dragon [employer] One" system interpretation.

## 2023-01-09 NOTE — Evaluation (Signed)
Physical Therapy Evaluation Patient Details Name: Ronald Dyer MRN: [id number] DOB: 09/27/1940 Today's Date: 01/09/2023  History of Present Illness  82 yo male admitted to hospital on 01/08/2023 due to AMS, poor PO intake with recurrent aspiration and failure to thrive. UA negative, CXR negative, head CT negative, abn lab findings with hypokalemia. Pt transitioned to ED from SNF following prior hospitalization 3/27-11/22/2022 for AMS. Pt has PMH including but not limited to: CKD IIIb, CVA, COPD, BPH, DM II, HTN, HDL, memory loss, recurrent falls, aorta atherosclerosis, diabetic retinopathy, colon ca s/p R hemicolectomy.  Clinical Impression      Pt admitted with above diagnosis.  Pt currently with functional limitations due to the deficits listed below (see PT Problem List). Pt in bed when PT arrived, wife and OD present. Discussion per d/c planning with wife reporting bed hold at [facility] but would like to take pt home. Wife reports pt has had a significant decline since March 2024. Pt is able to intermittently follow one step commands t/o PT eval with occasional appropriate verbal response with cues for attention, safety and direction. Pt required mod A for supine to sit with HOB elevated, min A to scoot to EOB, mod A for STS from elevated EOB, gait assessed with RW, min guard, recliner close, pt has narrow BOS, flexed posture with slow cadence. Pt demonstrate poor activity tolerance.  Pt denies pain, SOB and dizziness. Pt will benefit from acute skilled PT in current and next venue to increase their independence and safety with mobility to allow discharge.      Recommendations for follow up therapy are one component of a multi-disciplinary discharge planning process, led by the attending physician.  Recommendations may be updated based on patient status, additional functional criteria and insurance authorization.  Follow Up Recommendations Can patient physically be transported by private vehicle:  No     Assistance Recommended at Discharge Frequent or constant Supervision/Assistance  Patient can return home with the following  A little help with walking and/or transfers;A lot of help with bathing/dressing/bathroom;Assistance with cooking/housework;Direct supervision/assist for medications management;Direct supervision/assist for financial management;Assist for transportation;Help with stairs or ramp for entrance    Equipment Recommendations None recommended by PT  Recommendations for Other Services       Functional Status Assessment Patient has had a recent decline in their functional status and demonstrates the ability to make significant improvements in function in a reasonable and predictable amount of time.     Precautions / Restrictions Precautions Precautions: Fall Restrictions Weight Bearing Restrictions: No      Mobility  Bed Mobility Overal bed mobility: Needs Assistance Bed Mobility: Supine to Sit     Supine to sit: Mod assist, HOB elevated     General bed mobility comments: cues increased time, use of bed rail able to scoot to EOB once seated with min a and cues    Transfers Overall transfer level: Needs assistance Equipment used: Rolling walker (2 wheels) Transfers: Sit to/from Stand Sit to Stand: Mod assist, From elevated surface           General transfer comment: pt required cues for proper UE placement, noted decreased R UE motor control coordination and strength with wife indicating late effects from CVA    Ambulation/Gait Ambulation/Gait assistance: Min guard Gait Distance (Feet): 40 Feet Assistive device: Rolling walker (2 wheels) Gait Pattern/deviations: Step-to pattern, Shuffle, Narrow base of support, Trunk flexed Gait velocity: decreased     General Gait Details: scissor stepping pattern with turn,  cues for attention and encouragement, quickly fatigues, recliner close  Stairs            Wheelchair Mobility    Modified  Rankin (Stroke Patients Only)       Balance Overall balance assessment: Needs assistance Sitting-balance support: Feet supported Sitting balance-Leahy Scale: Fair     Standing balance support: Bilateral upper extremity supported, During functional activity, Reliant on assistive device for balance Standing balance-Leahy Scale: Poor                               Pertinent Vitals/Pain Pain Assessment Pain Assessment: No/denies pain    Home Living Family/patient expects to be discharged to:: Skilled nursing facility                   Additional Comments: prior to March hospitalization pt was at home with wife, amb with RW short household distances, completed basic ADLs with cues, wife is primary caregiver and unable to provide phyiscal assist in home setting.    Prior Function Prior Level of Function : Needs assist  Cognitive Assist : Mobility (cognitive);ADLs (cognitive) Mobility (Cognitive): Step by step cues ADLs (Cognitive): Step by step cues Physical Assist : Mobility (physical);ADLs (physical) Mobility (physical): Bed mobility;Gait ADLs (physical): Dressing;Toileting;Bathing Mobility Comments: uses a RW in home,  wife assists  with all activities ADLs Comments: one story home 4 steps to enter with R handrail, tub/shower unit with bench     Hand Dominance        Extremity/Trunk Assessment        Lower Extremity Assessment Lower Extremity Assessment: Generalized weakness    Cervical / Trunk Assessment Cervical / Trunk Assessment:  (head forward)  Communication   Communication: Other (comment) (increased time to respond, not consistent with appropriate response)  Cognition Arousal/Alertness: Lethargic Behavior During Therapy: Flat affect Overall Cognitive Status: Impaired/Different from baseline Area of Impairment: Attention, Memory, Following commands, Awareness                     Memory: Decreased short-term memory Following  Commands: Follows one step commands inconsistently                General Comments      Exercises     Assessment/Plan    PT Assessment Patient needs continued PT services  PT Problem List Decreased strength;Decreased activity tolerance;Decreased balance;Decreased mobility;Decreased coordination;Decreased cognition       PT Treatment Interventions DME instruction;Gait training;Functional mobility training;Therapeutic activities;Therapeutic exercise;Balance training;Neuromuscular re-education;Patient/family education    PT Goals (Current goals can be found in the Care Plan section)  Acute Rehab PT Goals Patient Stated Goal: unable to formulate goal PT Goal Formulation: With patient/family    Frequency Min 1X/week     Co-evaluation               AM-PAC PT "6 Clicks" Mobility  Outcome Measure Help needed turning from your back to your side while in a flat bed without using bedrails?: A Little Help needed moving from lying on your back to sitting on the side of a flat bed without using bedrails?: A Little Help needed moving to and from a bed to a chair (including a wheelchair)?: A Little Help needed standing up from a chair using your arms (e.g., wheelchair or bedside chair)?: A Lot Help needed to walk in hospital room?: A Little Help needed climbing 3-5 steps with a railing? : Total 6  Click Score: 15    End of Session Equipment Utilized During Treatment: Gait belt Activity Tolerance: Patient limited by fatigue Patient left: in chair;with call bell/phone within reach;with family/visitor present Nurse Communication: Mobility status PT Visit Diagnosis: Unsteadiness on feet (R26.81);Other abnormalities of gait and mobility (R26.89);Muscle weakness (generalized) (M62.81);Difficulty in walking, not elsewhere classified (R26.2)    Time: 1610-9604 PT Time Calculation (min) (ACUTE ONLY): 32 min   Charges:   PT Evaluation $PT Eval Low Complexity: 1 Low PT  Treatments $Gait Training: 8-22 mins        Johnny Bridge, PT Acute Rehab   Jacqualyn Posey 01/09/2023, 12:09 PM

## 2023-01-09 NOTE — TOC Initial Note (Signed)
Transition of Care [facility]) - Initial/Assessment Note   Patient Details  Name: Ronald Dyer MRN: [id number] Date of Birth: [birth year]  Transition of Care [facility]) CM/SW Contact:    Ewing Schlein, LCSW Phone Number: 01/09/2023, 10:04 AM  Clinical Narrative: CSW confirmed with Lawerance Cruel at [facility] that patient is a long-term care resident at the facility and the plan is for the patient to return at discharge. FL2 started. TOC to follow.  Expected Discharge Plan: Long Term Nursing Home Barriers to Discharge: Continued Medical Work up  Patient Goals and CMS Choice CMS Medicare.gov Compare Post Acute Care list provided to:: Patient Represenative (must comment) Choice offered to / list presented to : Spouse  Expected Discharge Plan and Services In-house Referral: Clinical Social Work Post Acute Care Choice: Nursing Home Living arrangements for the past 2 months: Skilled Nursing Facility           DME Arranged: N/A DME Agency: NA  Prior Living Arrangements/Services Living arrangements for the past 2 months: Skilled Nursing Facility Lives with:: Facility Resident Patient language and need for interpreter reviewed:: Yes Do you feel safe going back to the place where you live?: Yes      Need for Family Participation in Patient Care: Yes (Comment) Care giver support system in place?: Yes (comment) Criminal Activity/Legal Involvement Pertinent to Current Situation/Hospitalization: No - Comment as needed  Activities of Daily Living Home Assistive Devices/Equipment: None ADL Screening (condition at time of admission) Patient's cognitive ability adequate to safely complete daily activities?: No Is the patient deaf or have difficulty hearing?: No Does the patient have difficulty seeing, even when wearing glasses/contacts?: No Does the patient have difficulty concentrating, remembering, or making decisions?: Yes Patient able to express need for assistance with ADLs?: Yes Does the  patient have difficulty dressing or bathing?: Yes Independently performs ADLs?: No Communication: Independent Dressing (OT): Needs assistance Is this a change from baseline?: Pre-admission baseline Grooming: Needs assistance Is this a change from baseline?: Pre-admission baseline Feeding: Needs assistance Is this a change from baseline?: Pre-admission baseline Bathing: Needs assistance Is this a change from baseline?: Pre-admission baseline Toileting: Needs assistance Is this a change from baseline?: Pre-admission baseline In/Out Bed: Needs assistance Is this a change from baseline?: Pre-admission baseline Walks in Home: Needs assistance Is this a change from baseline?: Pre-admission baseline Does the patient have difficulty walking or climbing stairs?: Yes Weakness of Legs: Both Weakness of Arms/Hands: None  Emotional Assessment Appearance:: Appears stated age Orientation: : Oriented to Self, Oriented to Place Alcohol / Substance Use: Not Applicable Psych Involvement: No (comment)  Admission diagnosis:  Hypocalcemia [E83.51] Failure to thrive in adult [R62.7] Patient Active Problem List   Diagnosis Date Noted   Hypocalcemia 01/08/2023   COPD (chronic obstructive pulmonary disease) (HCC) 01/08/2023   History of stroke 01/08/2023   Failure to thrive in adult 01/08/2023   Sinus bradycardia 11/16/2022   HTN (hypertension) 11/16/2022   HLD (hyperlipidemia) 11/16/2022   Malnutrition of moderate degree (HCC) 11/15/2021   AKI (acute kidney injury) (HCC) 11/13/2021   Chronic kidney disease, stage 3b (HCC) 11/12/2021   Abnormal loss of weight 11/09/2020   Goals of care, counseling/discussion    Palliative care by specialist    Syncope 02/04/2020   BPH (benign prostatic hyperplasia) 02/04/2020   Confusion and disorientation 10/02/2019   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) 04/24/2019   Aphasia as late effect of cerebrovascular  accident 04/24/2019   Gait disturbance,  post-stroke 04/24/2019   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) 04/24/2019   CVA (cerebral vascular accident) (HCC) 06/22/2017   Colon cancer (HCC) 03/25/2014   Type 2 diabetes mellitus (HCC) 10/18/2006   Depression 10/18/2006   ALCOHOL ABUSE, UNSPECIFIED 10/18/2006   Other specified chronic obstructive pulmonary disease 10/18/2006   GASTROESOPHAGEAL REFLUX, NO ESOPHAGITIS 10/18/2006   PCP:  Sigmund Hazel, MD Pharmacy:   [id number] [place], [place] - [address] [place] [address] Phone: [phone] Fax: [phone]  [facility] [place], [place] - [address] [place] [address] Phone: [phone] Fax: [phone]  Social Determinants of Health (SDOH) Social History: SDOH Screenings   Food Insecurity: No Food Insecurity (11/16/2022)  Housing: Low Risk  (11/16/2022)  Transportation Needs: No Transportation Needs (11/16/2022)  Utilities: Not At Risk (11/16/2022)  Financial Resource Strain: Low Risk  (03/28/2019)  Physical Activity: Inactive (02/19/2019)  Tobacco Use: Medium Risk (01/09/2023)   SDOH Interventions:    Readmission Risk Interventions    11/21/2022   11:29 AM  Readmission Risk Prevention Plan  Post Dischage Appt Complete  Medication Screening Complete  Transportation Screening Complete

## 2023-01-10 DIAGNOSIS — J449 Chronic obstructive pulmonary disease, unspecified: Secondary | ICD-10-CM | POA: Diagnosis not present

## 2023-01-10 DIAGNOSIS — F0390 Unspecified dementia without behavioral disturbance: Secondary | ICD-10-CM | POA: Diagnosis not present

## 2023-01-10 DIAGNOSIS — Z7189 Other specified counseling: Secondary | ICD-10-CM | POA: Diagnosis not present

## 2023-01-10 DIAGNOSIS — R627 Adult failure to thrive: Secondary | ICD-10-CM | POA: Diagnosis not present

## 2023-01-10 DIAGNOSIS — Z515 Encounter for palliative care: Secondary | ICD-10-CM | POA: Diagnosis not present

## 2023-01-10 LAB — MAGNESIUM: Magnesium: 1.8 mg/dL (ref 1.7–2.4)

## 2023-01-10 LAB — BASIC METABOLIC PANEL
Anion gap: 10 (ref 5–15)
BUN: 19 mg/dL (ref 8–23)
CO2: 21 mmol/L — ABNORMAL LOW (ref 22–32)
Calcium: 9.4 mg/dL (ref 8.9–10.3)
Chloride: 109 mmol/L (ref 98–111)
Creatinine, Ser: 1.33 mg/dL — ABNORMAL HIGH (ref 0.61–1.24)
GFR, Estimated: 53 mL/min — ABNORMAL LOW (ref >60)
Glucose, Bld: 91 mg/dL (ref 70–99)
Potassium: 4.4 mmol/L (ref 3.5–5.1)
Sodium: 140 mmol/L (ref 135–145)

## 2023-01-10 MED ORDER — ADULT MULTIVITAMIN W/MINERALS CH
1.0000 | ORAL_TABLET | Freq: Every day | ORAL | Status: DC
  Administered 2023-01-11: 1 via ORAL
  Filled 2023-01-10: qty 1

## 2023-01-10 MED ORDER — ENSURE ENLIVE PO LIQD
237.0000 mL | Freq: Two times a day (BID) | ORAL | Status: DC
  Administered 2023-01-10: 237 mL via ORAL

## 2023-01-10 NOTE — Progress Notes (Signed)
Initial Nutrition Assessment  DOCUMENTATION CODES:   Non-severe (moderate) malnutrition in context of chronic illness  INTERVENTION:  - DYS 3 diet per MD.   - SLP eval pending.  - Ensure Plus High Protein po BID, each supplement provides 350 kcal and 20 grams of protein. - Magic cup BID with meals, each supplement provides 290 kcal and 9 grams of protein - Encourage intake and assist patient with eating as needed.  - Multivitamin with minerals daily - Monitor weight trends.    NUTRITION DIAGNOSIS:   Moderate Malnutrition related to chronic illness as evidenced by mild fat depletion, moderate muscle depletion.  GOAL:   Patient will meet greater than or equal to 90% of their needs  MONITOR:   PO intake, Supplement acceptance, Diet advancement, Weight trends  REASON FOR ASSESSMENT:   Malnutrition Screening Tool    ASSESSMENT:   82 y.o. male PMH significant for advanced dementia, chronic CKD stage IIIb, CVA, essential hypertension history of colon cancer with right hemicolectomy comes in for altered mental status.  Patient sleeping at time of visit, wife at bedside.  She reports patient's UBW to be 178# and does not feel he has had any recent changes in weight besides slight fluctuations. Per EMR, weight up from only other weight this year in March.   She reports patient was having trouble swallowing at living facility so was put on a pureed diet and tolerated much better. Notes he has good and bad days, where on a good day he will eat well and a bad day he will ont. Appetite also fluctuates daily. Was receiving nutrition supplements at facility but wife unsure exact brand. Notes he would occasionally be accepting of them.   She notes he does not appear to be very hungry now as only accepted a few bites of breakfast offered to him. She has been ordering him meals (also ordered several meals over the next 1-2 days) to help get him foods he may like. Documented to be consuming  0-50% of meals since admit. She feels he would be open to nutrition supplements as long as they are strawberry.  SLP has attempted to seen patient x3 since admit but patient limited by lethargy. Currently on a DYS 3 diet.   Medications reviewed and include: 3000 units vitamin D, Miralax, vitamin B12  Labs reviewed:  Creatinine 1.33 HA1C 6.4 Vitamin B12 803 (5/21) (WNL)   NUTRITION - FOCUSED PHYSICAL EXAM:  Flowsheet Row Most Recent Value  Orbital Region Mild depletion  Upper Arm Region No depletion  Thoracic and Lumbar Region No depletion  Buccal Region Moderate depletion  Temple Region Mild depletion  Clavicle Bone Region Moderate depletion  Clavicle and Acromion Bone Region Moderate depletion  Scapular Bone Region Unable to assess  Dorsal Hand Mild depletion  Patellar Region Moderate depletion  Anterior Thigh Region Moderate depletion  Posterior Calf Region Mild depletion  Edema (RD Assessment) None  Hair Reviewed  Eyes Reviewed  Mouth Reviewed  Skin Reviewed  Nails Reviewed       Diet Order:   Diet Order             DIET DYS 3 Room service appropriate? Yes; Fluid consistency: Thin  Diet effective now                   EDUCATION NEEDS:  No education needs have been identified at this time  Skin:     Last BM:  PTA  Height:  Ht Readings from  Last 1 Encounters:  01/09/23 6' (1.829 m)   Weight:  Wt Readings from Last 1 Encounters:  01/09/23 79.5 kg    BMI:  Body mass index is 23.77 kg/m.  Estimated Nutritional Needs:  Kcal:  1850-2000 kcals Protein:  95-110 grams Fluid:  >/= 1.8L    Shelle Iron RD, LDN For contact information, refer to [facility].

## 2023-01-10 NOTE — Evaluation (Signed)
SLP Cancellation Note  Patient Details Name: Ronald Dyer MRN: [id number] DOB: [birth year]   Cancelled treatment:       Reason Eval/Treat Not Completed: Other (comment) (pt lethargic per PT and MD notes, had a hard evening, will continue efforts)  [name], MS [facility] SLP Acute Rehab Services Office [phone]  [name] 01/10/2023, 1:00 PM

## 2023-01-10 NOTE — TOC Progression Note (Signed)
Transition of Care [facility]) - Progression Note   Patient Details  Name: Ronald Dyer MRN: [id number] Date of Birth: 1941-03-03  Transition of Care [facility]) CM/SW Contact  Ewing Schlein, LCSW Phone Number: 01/10/2023, 1:36 PM  Clinical Narrative: CSW met with patient and wife to discuss discharge planning; patient was quietly resting. CSW discussed [facility] and returning with palliative or hospice services. Wife is leaning towards LTC with possible hospice, but expressed some trepidation with solidifying a discharge plan due to everything that is going on with the patient and meeting with so many people at the hospital. CSW updated treatment team. TOC to follow.  Expected Discharge Plan: Long Term Nursing Home Barriers to Discharge: Continued Medical Work up  Expected Discharge Plan and Services In-house Referral: Clinical Social Work Post Acute Care Choice: Nursing Home Living arrangements for the past 2 months: Skilled Nursing Facility            DME Arranged: N/A DME Agency: NA  Social Determinants of Health (SDOH) Interventions SDOH Screenings   Food Insecurity: No Food Insecurity (11/16/2022)  Housing: Low Risk  (11/16/2022)  Transportation Needs: No Transportation Needs (11/16/2022)  Utilities: Not At Risk (11/16/2022)  Financial Resource Strain: Low Risk  (03/28/2019)  Physical Activity: Inactive (02/19/2019)  Tobacco Use: Medium Risk (01/09/2023)   Readmission Risk Interventions    11/21/2022   11:29 AM  Readmission Risk Prevention Plan  Post Dischage Appt Complete  Medication Screening Complete  Transportation Screening Complete

## 2023-01-10 NOTE — Evaluation (Signed)
SLP Cancellation Note  Patient Details Name: KRISTJAN SAYED MRN: [id number] DOB: 1941-01-08   Cancelled treatment:       Reason Eval/Treat Not Completed: Other (comment);Fatigue/lethargy limiting ability to participate (RN reports pt fatigued due to having medication overnight to help calm him for his safety, will continue efforts)  [name], MS [facility] SLP Acute Rehab Services Office [phone]  [name] 01/10/2023, 8:07 AM

## 2023-01-10 NOTE — Progress Notes (Addendum)
  Progress Note   Patient: Ronald Dyer ZOX:[id number] DOB: 11-20-1940 DOA: 01/08/2023     1 DOS: the patient was seen and examined on 01/10/2023   Brief hospital course:   Assessment and Plan: Hypocalcemia Resolved with repletion.   Chronic kidney disease, stage 3b (HCC) With a baseline creatinine 1.5-1.7, on admission 1.6.  Stable.   FTT Advanced dementia  Wife reported recurrent aspiration episodes to solids and liquids as an outpatient. Speech therapy consulted   History of CVA Continue aspirin and Lipitor.   COPD Continue inhalers.   BPH: Continue finasteride and Flomax.   Diabetes mellitus type 2: A1c of 6.4, probably does not require insulin stop checking CBGs.  Plan for return to long-term care with or without hospice or palliative care     Subjective:  Confused   Physical Exam: Vitals:   01/09/23 1330 01/09/23 1933 01/10/23 0618 01/10/23 1326  BP: 112/60 (!) 140/93 (!) 145/87 (!) 140/45  Pulse: (!) 53 64 72 82  Resp: 16 17 16 17   Temp: 97.9 F (36.6 C) 98.6 F (37 C) 98.7 F (37.1 C) 98.7 F (37.1 C)  TempSrc: Oral     SpO2: (!) 88% 100% 99% 93%  Weight:      Height:       Physical Exam Vitals reviewed.  Constitutional:      General: He is not in acute distress.    Appearance: He is not ill-appearing or toxic-appearing.  Cardiovascular:     Rate and Rhythm: Normal rate and regular rhythm.     Heart sounds: No murmur heard. Pulmonary:     Effort: Pulmonary effort is normal. No respiratory distress.     Breath sounds: No wheezing, rhonchi or rales.  Neurological:     Mental Status: He is alert. He is disoriented.     Data Reviewed: Creatinine 1.6 > 1.33 Mg2+ 1.8  Family Communication: wife at bedside  Disposition: Status is: Inpatient   Planned Discharge Destination: LTC    Time spent: 25 minutes  Author: Brendia Sacks, MD 01/10/2023 6:40 PM  For on call review [email or web address].

## 2023-01-10 NOTE — Hospital Course (Addendum)
Hx of advanced dementia with failure to thrive here with hypoK, hypoCa Consulted palliative care to meet with the family for end-of-life   PINCHOS CORDOVES is an 82 y.o. male past medical history significant for advanced Mancia, chronic NEC stage IIIb, CVA, essential hypertension history of colon cancer with right hemicolectomy comes in for altered mental status   82 year old man PMH including dementia was admitted for hypocalcemia, failure to thrive  Consultants Palliative Medicine  Procedures None

## 2023-01-10 NOTE — Progress Notes (Signed)
Physical Therapy Treatment Patient Details Name: Ronald Dyer MRN: [id number] DOB: 09-10-1940 Today's Date: 01/10/2023   History of Present Illness 82 yo male admitted to hospital on 01/08/2023 due to AMS, poor PO intake with recurrent aspiration and failure to thrive. UA negative, CXR negative, head CT negative, abn lab findings with hypokalemia. Pt transitioned to ED from SNF following prior hospitalization 3/27-11/22/2022 for AMS. Pt has PMH including but not limited to: CKD IIIb, CVA, COPD, BPH, DM II, HTN, HDL, memory loss, recurrent falls, aorta atherosclerosis, diabetic retinopathy, colon ca s/p R hemicolectomy.    PT Comments     Pt admitted with above diagnosis.  Pt currently with functional limitations due to the deficits listed below (see PT Problem List). Pt declining to participate with therapy this afternoon, suspect mediations overnight to address perceived behaviors and for pt safety. Communication with care team per pt d/c planning with ongoing recommendation for SNF setting. Pt will benefit from acute skilled PT to increase their independence and safety with mobility to allow discharge.     Recommendations for follow up therapy are one component of a multi-disciplinary discharge planning process, led by the attending physician.  Recommendations may be updated based on patient status, additional functional criteria and insurance authorization.  Follow Up Recommendations  Can patient physically be transported by private vehicle: No    Assistance Recommended at Discharge Frequent or constant Supervision/Assistance  Patient can return home with the following A little help with walking and/or transfers;A lot of help with bathing/dressing/bathroom;Assistance with cooking/housework;Direct supervision/assist for medications management;Direct supervision/assist for financial management;Assist for transportation;Help with stairs or ramp for entrance   Equipment Recommendations  None  recommended by PT    Recommendations for Other Services       Precautions / Restrictions Precautions Precautions: Fall Restrictions Weight Bearing Restrictions: No     Mobility  Bed Mobility               General bed mobility comments: pt in bed when PT arrived. Wife indicated that last night was not good and pt not engaging. PT made sevral attempts to engage pt with theraputic activity and redirect. Pt able to open eyes and communicate with therapist reporting he did not want to get OOB, pt resistive to facilitation for LEs to EOB and then indicated his feet hurt. pt unable to rate or describe pain. pt left in bed, all needs in place and wife present.    Transfers                        Ambulation/Gait                   Stairs             Wheelchair Mobility    Modified Rankin (Stroke Patients Only)       Balance Overall balance assessment: Needs assistance Sitting-balance support: Feet supported Sitting balance-Leahy Scale: Fair     Standing balance support: Bilateral upper extremity supported, During functional activity, Reliant on assistive device for balance Standing balance-Leahy Scale: Poor                              Cognition Arousal/Alertness: Lethargic Behavior During Therapy: Flat affect Overall Cognitive Status: Impaired/Different from baseline Area of Impairment: Attention, Memory, Following commands, Awareness  Memory: Decreased short-term memory Following Commands: Follows one step commands inconsistently                Exercises      General Comments        Pertinent Vitals/Pain      Home Living                          Prior Function            PT Goals (current goals can now be found in the care plan section) Acute Rehab PT Goals Patient Stated Goal: unable to formulate goal PT Goal Formulation: With patient/family    Frequency    Min  1X/week      PT Plan      Co-evaluation              AM-PAC PT "6 Clicks" Mobility   Outcome Measure  Help needed turning from your back to your side while in a flat bed without using bedrails?: A Little Help needed moving from lying on your back to sitting on the side of a flat bed without using bedrails?: A Little Help needed moving to and from a bed to a chair (including a wheelchair)?: A Little Help needed standing up from a chair using your arms (e.g., wheelchair or bedside chair)?: A Lot Help needed to walk in hospital room?: A Little Help needed climbing 3-5 steps with a railing? : Total 6 Click Score: 15    End of Session   Activity Tolerance: Patient limited by lethargy Patient left: in chair;with call bell/phone within reach;with family/visitor present Nurse Communication: Mobility status PT Visit Diagnosis: Unsteadiness on feet (R26.81);Other abnormalities of gait and mobility (R26.89);Muscle weakness (generalized) (M62.81);Difficulty in walking, not elsewhere classified (R26.2)     Time: 1610-9604 PT Time Calculation (min) (ACUTE ONLY): 11 min  Charges:  $Therapeutic Activity: 8-22 mins                     Johnny Bridge, PT Acute Rehab    Jacqualyn Posey 01/10/2023, 12:24 PM

## 2023-01-10 NOTE — Progress Notes (Signed)
Daily Progress Note   Patient Name: Ronald Dyer       Date: 01/10/2023 DOB: 02-09-1941  Age: 82 y.o. MRN#: [id number] Attending Physician: Standley Brooking, MD Primary Care Physician: Sigmund Hazel, MD Admit Date: 01/08/2023 Length of Stay: 1 day  Reason for Consultation/Follow-up: Establishing goals of care  Subjective:   CC: Patient lethargic laying gin bed. Spoke with patient's wife at bedside. Following up regarding complex medical decision making.   Subjective:  Reviewed EMR prior to presenting to bedside.  Patient became agitated overnight and required low-dose olanzapine for agitation.  When SLP and PT came to work with patient today, patient remained lethargic.  Scented to bedside to meet with patient.  Patient seen sleeping in bed.  Spoke with patient's wife at bedside.  Patient noted today was not a good day 1 yesterday was such a good day.  Wife continues to express that she understands with patient's underlying dementia there will be good days and there will be bad days.  Hope is that the good dose will outweigh the bad.  She continues to states she knows dementia takes a person's life.  Empathized with difficult situation.  Spent time discussing care moving forward as trying to assist in determining about patient going back to long-term care with or without physical therapy.  Explained to wife that I did reach out to [facility] [place] liaison Cheri to continue palliative follow-up in the outpatient setting.  Noted patient cannot receive this service while in rehab though if returning to long-term care, can receive their services and support there.  Wife voiced appreciation for this.  Spent time discussing patient's continued deterioration expectation with underlying dementia which wife voices knowing will happen.  Continuing to take things a day at a time.  All questions answered at that time.  Noted would reach out to team to coordinate answered for wife regarding when  patient will return to long-term care.  Review of Systems lethargic Objective:   Vital Signs:  BP (!) 145/87 (BP Location: Left Arm)   Pulse 72   Temp 98.7 F (37.1 C)   Resp 16   Ht 6' (1.829 m)   Wt 79.5 kg   SpO2 99%   BMI 23.77 kg/m   Physical Exam: General: NAD, lethargic, chronically ill-appearing Eyes: No drainage noted HENT: dry mucous membranes Cardiovascular: RRR Respiratory: no increased work of breathing noted, not in respiratory distress Abdomen: not distended Skin: no rashes or lesions on visible skin Neuro: lethargic  Imaging:  I personally reviewed recent imaging.   Assessment & Plan:   Assessment: Patient is an 82 year old male with a past medical history of dementia, CKD stage IIIb, CVA, hypertension, COPD, type 2 diabetes mellitus, BPH, hyperlipidemia, and colon cancer status post right hemicolectomy who was admitted on 01/08/2023 for management of altered mental status. Patient has been a resident at [facility] since March. Since admission, patient has received management for hypocalcemia. Imaging did not note any acute changes. Palliative medicine team consulted to assist with complex medical decision making.   Recommendations/Plan: # Complex medical decision making/goals of care:   -Patient unable to participate in complex medical decision-making due to underlying medical status.                -Discussed care with patient's wife at bedside.  Patient is a LTC resident at [facility], planning to return there with.without rehab depending on PT recommendations. Wife acknowledges that patient's medical condition will continue to deteriorate with his  underlying dementia.                -Have discussed with patient's wife and [facility] [address], [place]. Will plan to continue outpatient palliative follow in the LTC setting to support wife and discussions as patient transitions through stages of dementia to point of needing hospice level of care. Cheri to  follow up with wife.                -  Code Status: DNR    # Symptom management                -As per primary hospitalist.    # Psycho-social/Spiritual Support:  - Support System: wife   # Discharge Planning:  [facility] facility with outpatient palliative via [facility] [place]   Discussed with: patient's wife, hosptialist, RN, TOC, [facility] liaison   Thank you for allowing the palliative care team to participate in the care Valeda Malm.  Alvester Morin, DO Palliative Care Provider PMT # [phone]  If patient remains symptomatic despite maximum doses, please call PMT at [phone] between 0700 and 1900. Outside of these hours, please call attending, as PMT does not have night coverage.  *Please note that this is a verbal dictation therefore any spelling or grammatical errors are due to the "[employer] One" system interpretation.

## 2023-01-11 DIAGNOSIS — F0390 Unspecified dementia without behavioral disturbance: Secondary | ICD-10-CM | POA: Diagnosis not present

## 2023-01-11 DIAGNOSIS — R627 Adult failure to thrive: Secondary | ICD-10-CM | POA: Diagnosis not present

## 2023-01-11 DIAGNOSIS — E1169 Type 2 diabetes mellitus with other specified complication: Secondary | ICD-10-CM | POA: Diagnosis not present

## 2023-01-11 MED ORDER — ENSURE ENLIVE PO LIQD: 237.0000 mL | Freq: Two times a day (BID) | ORAL | Status: DC

## 2023-01-11 NOTE — Progress Notes (Addendum)
SLP saw pt and wife. Pt would awaken but did not follow directions - and declined all intake despite multiple options discussed including honey bun, ice cream, soda, etc.  Discussed with wife regarding pt tolerance of po prior to admit and in hospital.  She states pt has not eaten much here and states he tolerated puree better than solids at the facility.   Discussed comfort po and reviewed handout discussing intake assuring appropriate mentation, willingness and sitting upright for all po. Encourage self feeding and use slightly thicker liquids or icecream, etc to push food retained in oral cavity into pharynx to decrease thin liquid spillage into airway.  Observation of laryngeal elevation to assure swallows indicated with demonstration to pt's wife.  Also encouraged self feeding and accommodating gustatory changes (provide sweeter foods if pt prefers),   She advises it is difficult stating he "comes and goes" and understands offering intake helpful only when/if pt desires.  SLP relayed information to MD and RN regarding wife's wishes.  Wrote swallow precaution signs created -and provided to wife.  Session completed with family education taking approx 12 minutes.      01/11/23 1200  SLP Time Calculation  SLP Start Time (ACUTE ONLY) 1050  SLP Stop Time (ACUTE ONLY) 1113  SLP Time Calculation (min) (ACUTE ONLY) 23 min    [name], MS [facility] SLP Acute Rehab Services Office [phone]

## 2023-01-11 NOTE — Progress Notes (Signed)
  Daily Progress Note   Patient Name: Ronald Dyer       Date: 01/11/2023 DOB: [birth year]  Age: 82 y.o. MRN#: [id number] Attending Physician: Standley Brooking, MD Primary Care Physician: Sigmund Hazel, MD Admit Date: 01/08/2023 Length of Stay: 2 days  [facility], Norm Parcel, able to speak with patient's wife.  At this time, planning for discharge today to long-term care facility with hospice support.  Please reach out if palliative medicine meeting can be of further assistance.Thank you.    Alvester Morin, DO Palliative Care Provider PMT # [phone]

## 2023-01-11 NOTE — TOC Transition Note (Signed)
Transition of Care [facility]) - CM/SW Discharge Note  Patient Details  Name: Ronald Dyer MRN: [id number] Date of Birth: 11/10/1940  Transition of Care [facility]) CM/SW Contact:  Ewing Schlein, LCSW Phone Number: 01/11/2023, 1:11 PM  Clinical Narrative: Patient is ready to return to [facility] with hospice services through [facility]. FL2 done. Patient will go to room 905B and the number for report is [phone]. Discharge summary, discharge orders, SNF transfer report, and FL2 faxed to facility in hub. Medical necessity form done; PTAR scheduled. Discharge packet completed. CSW updated wife regarding transportation. RN updated. TOC signing off.  Final next level of care: Long Term Nursing Home Barriers to Discharge: Barriers Resolved  Patient Goals and CMS Choice CMS Medicare.gov Compare Post Acute Care list provided to:: Patient Represenative (must comment) Choice offered to / list presented to : Spouse  Discharge Placement      Patient chooses bed at: [facility] Patient to be transferred to facility by: PTAR Name of family member notified: Yvonne Stueber (spouse) Patient and family notified of of transfer: 01/11/23  Discharge Plan and Services Additional resources added to the After Visit Summary for   In-house Referral: Clinical Social Work Post Acute Care Choice: Nursing Home          DME Arranged: N/A DME Agency: NA  Social Determinants of Health (SDOH) Interventions SDOH Screenings   Food Insecurity: No Food Insecurity (11/16/2022)  Housing: Low Risk  (11/16/2022)  Transportation Needs: No Transportation Needs (11/16/2022)  Utilities: Not At Risk (11/16/2022)  Financial Resource Strain: Low Risk  (03/28/2019)  Physical Activity: Inactive (02/19/2019)  Tobacco Use: Medium Risk (01/09/2023)   Readmission Risk Interventions    11/21/2022   11:29 AM  Readmission Risk Prevention Plan  Post Dischage Appt Complete  Medication Screening Complete   Transportation Screening Complete

## 2023-01-11 NOTE — Discharge Summary (Signed)
Physician Discharge Summary   Patient: Ronald Dyer MRN: [id number] DOB: [birth year]  Admit date:     01/08/2023  Discharge date: 01/11/23  Discharge Physician: Brendia Sacks   PCP: Sigmund Hazel, MD   Recommendations at discharge:   Return to long-term care facility with hospice.  Narrow medications as appropriate based on goals of care.  Antihyperglycemic's were stopped, see discussion below.  Discharge Diagnoses: Principal Problem:   Hypocalcemia Active Problems:   Chronic kidney disease, stage 3b (HCC)   Type 2 diabetes mellitus (HCC)   BPH (benign prostatic hyperplasia)   Malnutrition of moderate degree (HCC)   Sinus bradycardia   COPD (chronic obstructive pulmonary disease) (HCC)   History of stroke   Failure to thrive in adult   Need for emotional support   Counseling and coordination of care   Palliative care encounter   Dementia [facility])  Resolved Problems:   * No resolved hospital problems. *  Hospital Course: 82 year old man PMH including dementia was admitted for hypocalcemia, failure to thrive.  Hypocalcemia was corrected.  Failure to thrive secondary to dementia noted.  Condition slowly worsening over time.  Seen by palliative care.  Ultimately decision was made to return to long-term care with hospice services.  Consultants Palliative Medicine  Procedures None  Hypocalcemia Resolved with repletion. Likely secondary to poor oral intake   Chronic kidney disease, stage 3b (HCC) With a baseline creatinine 1.5-1.7, on admission 1.6.  Stable.   FTT Advanced dementia  Wife reported recurrent aspiration episodes to solids and liquids as an outpatient. Speech therapy consulted --appreciate evaluation -- discussed w/ Ms. Ilean Skill Puree/thin diet at facility; may bring him soft solids *finger foods* that he may enjoy to eat. Family able to bring other food items for pt   Encourage self feeding and use slightly thicker liquids or icecream, etc to push food  retained in oral cavity into pharynx to decrease thin liquid spillage into airway. Observation of laryngeal elevation to assure swallows. Also encouraged self feeding and accommodating gustatory changes (provide sweeter foods if pt prefers),    History of CVA Continue aspirin and Lipitor.   COPD Continue inhalers.   BPH: Continue finasteride and Flomax.   Diabetes mellitus type 2: A1c of 6.4 Did not require insulin.  Will hold oral agents given resting CBGs 73-91 and poor oral intake.  Could restart oral agents if clinically indicated.   Return to long-term care with hospice.      Disposition: return to LTC, now with hospice Diet recommendation:  Puree/thin diet at facility; may bring him soft solids *finger foods* that he may enjoy to eat. Family able to bring other food items for pt   Encourage self feeding and use slightly thicker liquids or icecream, etc to push food retained in oral cavity into pharynx to decrease thin liquid spillage into airway. Observation of laryngeal elevation to assure swallows. Also encouraged self feeding and accommodating gustatory changes (provide sweeter foods if pt prefers),  DISCHARGE MEDICATION: Allergies as of 01/11/2023       Reactions   Metformin Hcl Diarrhea, Other (See Comments)    "Allergic," per paperwork from facility   Simvastatin Other (See Comments)   High CPK and "Allergic," per paperwork from facility        Medication List     STOP taking these medications    donepezil 10 MG tablet Commonly known as: ARICEPT   gabapentin 100 MG capsule Commonly known as: NEURONTIN   Jardiance 25 MG Tabs tablet  Generic drug: empagliflozin   pioglitazone 30 MG tablet Commonly known as: ACTOS       TAKE these medications    acetaminophen 500 MG tablet Commonly known as: TYLENOL Take 1,000 mg by mouth in the morning and at bedtime.   aspirin 81 MG tablet Take 81 mg by mouth every other day.   atorvastatin 10 MG  tablet Commonly known as: LIPITOR Take 10 mg by mouth every other day.   cyanocobalamin 1000 MCG tablet Take 1,000 mcg by mouth daily.   feeding supplement Liqd Take 237 mLs by mouth 2 (two) times daily between meals.   finasteride 5 MG tablet Commonly known as: PROSCAR Take 5 mg by mouth daily.   hydrochlorothiazide 25 MG tablet Commonly known as: HYDRODIURIL Take 25 mg by mouth every morning.   lisinopril 20 MG tablet Commonly known as: ZESTRIL Take 20 mg by mouth daily.   multivitamin tablet Take 1 tablet by mouth daily with breakfast.   NON FORMULARY Take 120 mLs by mouth See admin instructions. Sugar-free MedPass- Drink 120 ml's by mouth two times a day   Protonix 40 mg Generic drug: pantoprazole sodium Take 40 mg by mouth daily. What changed: Another medication with the same name was removed. Continue taking this medication, and follow the directions you see here.   sertraline 100 MG tablet Commonly known as: ZOLOFT Take 100 mg by mouth daily.   tamsulosin 0.4 MG Caps capsule Commonly known as: FLOMAX Take 0.4 mg by mouth daily.   Vitamin D3 1000 units Caps Take 3,000 Units by mouth daily.       does not respond to voice Discharge Exam: Filed Weights   01/09/23 0322  Weight: 79.5 kg   Physical Exam Vitals reviewed.  Constitutional:      General: He is not in acute distress.    Appearance: He is not ill-appearing or toxic-appearing.  Cardiovascular:     Rate and Rhythm: Normal rate and regular rhythm.     Heart sounds: No murmur heard. Pulmonary:     Effort: Pulmonary effort is normal. No respiratory distress.     Breath sounds: No wheezing, rhonchi or rales.      Condition at discharge: fair  The results of significant diagnostics from this hospitalization (including imaging, microbiology, ancillary and laboratory) are listed below for reference.   Imaging Studies: DG Chest Portable 1 View  Result Date: 01/08/2023 CLINICAL DATA:   Failure to thrive EXAM: PORTABLE CHEST 1 VIEW COMPARISON:  11/15/2022 FINDINGS: Cardiac size is within normal limits. There are no signs of pulmonary edema or focal pulmonary consolidation. Right hemidiaphragm is elevated. Small linear densities in lower lung fields may suggest minimal scarring or subsegmental atelectasis. Left lateral CP angle is indistinct. There is no pneumothorax. IMPRESSION: There are no signs of pulmonary edema or focal pulmonary consolidation. Left lateral CP angle is indistinct which may be due to pleural thickening or minimal effusion. Electronically Signed   By: Ernie Avena M.D.   On: 01/08/2023 15:42   CT HEAD WO CONTRAST  Result Date: 01/08/2023 CLINICAL DATA:  Neuro deficit, acute, stroke suspected. Failure to thrive. EXAM: CT HEAD WITHOUT CONTRAST TECHNIQUE: Contiguous axial images were obtained from the base of the skull through the vertex without intravenous contrast. RADIATION DOSE REDUCTION: This exam was performed according to the departmental dose-optimization program which includes automated exposure control, adjustment of the mA and/or kV according to patient size and/or use of iterative reconstruction technique. COMPARISON:  Head CT 11/15/2022 and  MRI 11/16/2022 FINDINGS: Brain: There is no evidence of an acute infarct, intracranial hemorrhage, mass, midline shift, or extra-axial fluid collection. Patchy to confluent hypodensities in the cerebral white matter bilaterally are similar to the prior CT and are nonspecific but compatible with moderately advanced chronic small vessel ischemic disease. Chronic lacunar infarcts are again noted in the thalami and pons. Mild cerebral atrophy is within normal limits for age. Vascular: Calcified atherosclerosis at the skull base. No hyperdense vessel. Skull: No acute fracture or suspicious osseous lesion. Sinuses/Orbits: Small mucous retention cysts in the left maxillary and left sphenoid sinuses. Clear mastoid air cells.  Bilateral cataract extraction. Other: None. IMPRESSION: 1. No evidence of acute intracranial abnormality. 2. 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 Electronically Signed   By: Sebastian Ache M.D.   On: 01/08/2023 15:42    Microbiology: Results for orders placed or performed during the hospital encounter of 01/08/23  Resp panel by RT-PCR (RSV, Flu A&B, Covid) Anterior Nasal Swab     Status: None   Collection Time: 01/08/23  3:31 PM   Specimen: Anterior Nasal Swab  Result Value Ref Range Status   SARS Coronavirus 2 by RT PCR NEGATIVE NEGATIVE Final    Comment: (NOTE) SARS-CoV-2 target nucleic acids are NOT DETECTED.  The SARS-CoV-2 RNA is generally detectable in upper respiratory specimens during the acute phase of infection. The lowest concentration of SARS-CoV-2 viral copies this assay can detect is 138 copies/mL. A negative result does not preclude SARS-Cov-2 infection and should not be used as the sole basis for treatment or other patient management decisions. A negative result may occur with  improper specimen collection/handling, submission of specimen other than nasopharyngeal swab, presence of viral mutation(s) within the areas targeted by this assay, and inadequate number of viral copies(<138 copies/mL). A negative result must be combined with clinical observations, patient history, and epidemiological information. The expected result is Negative.  Fact Sheet for Patients:  BloggerCourse.com  Fact Sheet for Healthcare Providers:  SeriousBroker.it  This test is no t yet approved or cleared by the [place] FDA and  has been authorized for detection and/or diagnosis of SARS-CoV-2 by FDA under an Emergency Use Authorization (EUA). This EUA will remain  in effect (meaning this test can be used) for the duration of the COVID-19 declaration under Section 564(b)(1) of the Act, 21 U.S.C.section 360bbb-3(b)(1),  unless the authorization is terminated  or revoked sooner.       Influenza A by PCR NEGATIVE NEGATIVE Final   Influenza B by PCR NEGATIVE NEGATIVE Final    Comment: (NOTE) The Xpert Xpress SARS-CoV-2/FLU/RSV plus assay is intended as an aid in the diagnosis of influenza from Nasopharyngeal swab specimens and should not be used as a sole basis for treatment. Nasal washings and aspirates are unacceptable for Xpert Xpress SARS-CoV-2/FLU/RSV testing.  Fact Sheet for Patients: BloggerCourse.com  Fact Sheet for Healthcare Providers: SeriousBroker.it  This test is not yet approved or cleared by the [place] FDA and has been authorized for detection and/or diagnosis of SARS-CoV-2 by FDA under an Emergency Use Authorization (EUA). This EUA will remain in effect (meaning this test can be used) for the duration of the COVID-19 declaration under Section 564(b)(1) of the Act, 21 U.S.C. section 360bbb-3(b)(1), unless the authorization is terminated or revoked.     Resp Syncytial Virus by PCR NEGATIVE NEGATIVE Final    Comment: (NOTE) Fact Sheet for Patients: BloggerCourse.com  Fact Sheet for Healthcare Providers: SeriousBroker.it  This test is not yet  approved or cleared by the [place] and has been authorized for detection and/or diagnosis of SARS-CoV-2 by FDA under an Emergency Use Authorization (EUA). This EUA will remain in effect (meaning this test can be used) for the duration of the COVID-19 declaration under Section 564(b)(1) of the Act, 21 U.S.C. section 360bbb-3(b)(1), unless the authorization is terminated or revoked.  Performed at [facility], [address], [place], [place] [address]     Labs: CBC: Recent Labs  Lab 01/08/23 1416 01/08/23 1552  WBC 6.3  --   NEUTROABS 3.4  --   HGB 12.7* 9.2*  HCT 39.5 27.0*  MCV 82.6  --   PLT  168  --    Basic Metabolic Panel: Recent Labs  Lab 01/08/23 1550 01/08/23 1552 01/09/23 0348 01/10/23 0357  NA 144 147* 144 140  K 3.3* 3.2* 4.4 4.4  CL 121* 114* 112* 109  CO2 16*  --  22 21*  GLUCOSE 73 74 73 91  BUN 24* 27* 29* 19  CREATININE 1.16 1.20 1.60* 1.33*  CALCIUM 6.2*  --  9.5 9.4  MG 1.4*  --   --  1.8   Liver Function Tests: Recent Labs  Lab 01/08/23 1550  AST 14*  ALT 10  ALKPHOS 37*  BILITOT 0.8  PROT 4.2*  ALBUMIN 2.1*   CBG: No results for input(s): "GLUCAP" in the last 168 hours.  Discharge time spent: greater than 30 minutes.  Signed: Brendia Sacks, MD [facility] 01/11/2023

## 2023-01-11 NOTE — [place] FL2 (Signed)
[place] MEDICAID FL2 LEVEL OF CARE FORM     IDENTIFICATION  Patient Name: Ronald Dyer Birthdate: June 14, 1941 Sex: male Admission Date (Current Location): 01/08/2023  [facility] and [place] Number:  Producer, television/film/video and Address:  [facility],  [address], [place] [address]      Provider Number: [id number]  Attending Physician Name and Address:  Standley Brooking, MD  Relative Name and Phone Number:  Laila Condit (spouse) Ph: [phone]    Current Level of Care: Hospital Recommended Level of Care: Nursing Facility Prior Approval Number:    Date Approved/Denied:   PASRR Number: [id number]  Discharge Plan: SNF [facility])    Current Diagnoses: Patient Active Problem List   Diagnosis Date Noted   Need for emotional support 01/09/2023   Counseling and coordination of care 01/09/2023   Palliative care encounter 01/09/2023   Dementia (HCC) 01/09/2023   Hypocalcemia 01/08/2023   COPD (chronic obstructive pulmonary disease) (HCC) 01/08/2023   History of stroke 01/08/2023   Failure to thrive in adult 01/08/2023   Sinus bradycardia 11/16/2022   HTN (hypertension) 11/16/2022   HLD (hyperlipidemia) 11/16/2022   Malnutrition of moderate degree (HCC) 11/15/2021   AKI (acute kidney injury) (HCC) 11/13/2021   Chronic kidney disease, stage 3b (HCC) 11/12/2021   Abnormal loss of weight 11/09/2020   Goals of care, counseling/discussion    Palliative care by specialist    Syncope 02/04/2020   BPH (benign prostatic hyperplasia) 02/04/2020   Confusion and disorientation 10/02/2019   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) 04/24/2019   Aphasia as late effect of cerebrovascular accident 04/24/2019   Gait disturbance, post-stroke 04/24/2019   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) 04/24/2019   CVA (cerebral vascular accident) (HCC) 06/22/2017    Colon cancer (HCC) 03/25/2014   Type 2 diabetes mellitus (HCC) 10/18/2006   Depression 10/18/2006   ALCOHOL ABUSE, UNSPECIFIED 10/18/2006   Other specified chronic obstructive pulmonary disease 10/18/2006   GASTROESOPHAGEAL REFLUX, NO ESOPHAGITIS 10/18/2006    Orientation RESPIRATION BLADDER Height & Weight     Self, Place  Normal Incontinent Weight: 175 lb 4.3 oz (79.5 kg) Height:  6' (182.9 cm)  BEHAVIORAL SYMPTOMS/MOOD NEUROLOGICAL BOWEL NUTRITION STATUS     (N/A) Continent Diet (Dysphagia 3 diet)  AMBULATORY STATUS COMMUNICATION OF NEEDS Skin   Extensive Assist Verbally Normal                       Personal Care Assistance Level of Assistance  Bathing, Feeding, Dressing Bathing Assistance: Maximum assistance Feeding assistance: Limited assistance Dressing Assistance: Maximum assistance     Functional Limitations Info  Sight, Hearing, Speech Sight Info: Impaired Hearing Info: Impaired Speech Info: Adequate    SPECIAL CARE FACTORS FREQUENCY                       Contractures Contractures Info: Not present    Additional Factors Info  Code Status, Allergies, Psychotropic Code Status Info: DNR Allergies Info: Metformin Hcl, Simvastatin Psychotropic Info: Zoloft         Current Medications (01/11/2023):  This is the current hospital active medication list Current Facility-Administered Medications  Medication Dose Route Frequency Provider Last Rate Last Admin   aspirin chewable tablet 81 mg  81 mg Oral QODAY Tu, Ching T, DO   81 mg at 01/11/23 1011   atorvastatin (LIPITOR) tablet 10 mg  10  mg Oral QODAY Tu, Ching T, DO   10 mg at 01/09/23 1637   cholecalciferol (VITAMIN D3) 25 MCG (1000 UNIT) tablet 3,000 Units  3,000 Units Oral Daily Tu, Ching T, DO   3,000 Units at 01/11/23 1010   enoxaparin (LOVENOX) injection 40 mg  40 mg Subcutaneous Q24H Tu, Ching T, DO   40 mg at 01/10/23 2037   feeding supplement (ENSURE ENLIVE / ENSURE PLUS) liquid 237 mL  237 mL  Oral BID BM Standley Brooking, MD   237 mL at 01/10/23 1400   finasteride (PROSCAR) tablet 5 mg  5 mg Oral Daily Tu, Ching T, DO   5 mg at 01/11/23 1011   hydrochlorothiazide (HYDRODIURIL) tablet 25 mg  25 mg Oral q morning Tu, Ching T, DO   25 mg at 01/11/23 1010   lisinopril (ZESTRIL) tablet 20 mg  20 mg Oral Daily Tu, Ching T, DO   20 mg at 01/11/23 1010   multivitamin with minerals tablet 1 tablet  1 tablet Oral Daily Standley Brooking, MD   1 tablet at 01/11/23 1010   pantoprazole (PROTONIX) EC tablet 40 mg  40 mg Oral Daily Tu, Ching T, DO   40 mg at 01/11/23 1010   sertraline (ZOLOFT) tablet 100 mg  100 mg Oral Daily Tu, Ching T, DO   100 mg at 01/11/23 1011   tamsulosin (FLOMAX) capsule 0.4 mg  0.4 mg Oral Daily Tu, Ching T, DO   0.4 mg at 01/11/23 1011   vitamin B-12 (CYANOCOBALAMIN) tablet 100 mcg  100 mcg Oral Daily Tu, Ching T, DO   100 mcg at 01/11/23 1010     Discharge Medications: Please see discharge summary for a list of discharge medications.  Relevant Imaging Results:  Relevant Lab Results:   Additional Information SSN: [id number]  Ewing Schlein, LCSW

## 2023-01-12 DIAGNOSIS — R41841 Cognitive communication deficit: Secondary | ICD-10-CM | POA: Diagnosis not present

## 2023-01-12 DIAGNOSIS — N1832 Chronic kidney disease, stage 3b: Secondary | ICD-10-CM | POA: Diagnosis not present

## 2023-01-12 DIAGNOSIS — R1312 Dysphagia, oropharyngeal phase: Secondary | ICD-10-CM | POA: Diagnosis not present

## 2023-01-12 DIAGNOSIS — M6259 Muscle wasting and atrophy, not elsewhere classified, multiple sites: Secondary | ICD-10-CM | POA: Diagnosis not present

## 2023-01-12 DIAGNOSIS — E1122 Type 2 diabetes mellitus with diabetic chronic kidney disease: Secondary | ICD-10-CM | POA: Diagnosis not present

## 2023-01-12 DIAGNOSIS — R627 Adult failure to thrive: Secondary | ICD-10-CM | POA: Diagnosis not present

## 2023-01-12 DIAGNOSIS — D631 Anemia in chronic kidney disease: Secondary | ICD-10-CM | POA: Diagnosis not present

## 2023-01-12 DIAGNOSIS — R2681 Unsteadiness on feet: Secondary | ICD-10-CM | POA: Diagnosis not present

## 2023-01-12 DIAGNOSIS — M6281 Muscle weakness (generalized): Secondary | ICD-10-CM | POA: Diagnosis not present

## 2023-01-12 DIAGNOSIS — R1310 Dysphagia, unspecified: Secondary | ICD-10-CM | POA: Diagnosis not present

## 2023-01-12 DIAGNOSIS — J449 Chronic obstructive pulmonary disease, unspecified: Secondary | ICD-10-CM | POA: Diagnosis not present

## 2023-01-12 DIAGNOSIS — E119 Type 2 diabetes mellitus without complications: Secondary | ICD-10-CM | POA: Diagnosis not present

## 2023-01-12 DIAGNOSIS — Z8673 Personal history of transient ischemic attack (TIA), and cerebral infarction without residual deficits: Secondary | ICD-10-CM | POA: Diagnosis not present

## 2023-01-12 DIAGNOSIS — I69391 Dysphagia following cerebral infarction: Secondary | ICD-10-CM | POA: Diagnosis not present

## 2023-01-12 DIAGNOSIS — N183 Chronic kidney disease, stage 3 unspecified: Secondary | ICD-10-CM | POA: Diagnosis not present

## 2023-01-12 DIAGNOSIS — F03C18 Unspecified dementia, severe, with other behavioral disturbance: Secondary | ICD-10-CM | POA: Diagnosis not present

## 2023-01-12 DIAGNOSIS — R2689 Other abnormalities of gait and mobility: Secondary | ICD-10-CM | POA: Diagnosis not present

## 2023-01-12 DIAGNOSIS — N400 Enlarged prostate without lower urinary tract symptoms: Secondary | ICD-10-CM | POA: Diagnosis not present

## 2023-01-16 DIAGNOSIS — R5381 Other malaise: Secondary | ICD-10-CM | POA: Diagnosis not present

## 2023-01-16 DIAGNOSIS — W19XXXA Unspecified fall, initial encounter: Secondary | ICD-10-CM | POA: Diagnosis not present

## 2023-01-16 DIAGNOSIS — R262 Difficulty in walking, not elsewhere classified: Secondary | ICD-10-CM | POA: Diagnosis not present

## 2023-02-06 DIAGNOSIS — J449 Chronic obstructive pulmonary disease, unspecified: Secondary | ICD-10-CM | POA: Diagnosis not present

## 2023-02-06 DIAGNOSIS — I150 Renovascular hypertension: Secondary | ICD-10-CM | POA: Diagnosis not present

## 2023-02-06 DIAGNOSIS — Z8673 Personal history of transient ischemic attack (TIA), and cerebral infarction without residual deficits: Secondary | ICD-10-CM | POA: Diagnosis not present

## 2023-02-06 DIAGNOSIS — E1122 Type 2 diabetes mellitus with diabetic chronic kidney disease: Secondary | ICD-10-CM | POA: Diagnosis not present

## 2023-02-06 DIAGNOSIS — Z6822 Body mass index (BMI) 22.0-22.9, adult: Secondary | ICD-10-CM | POA: Diagnosis not present

## 2023-02-06 DIAGNOSIS — F03C4 Unspecified dementia, severe, with anxiety: Secondary | ICD-10-CM | POA: Diagnosis not present

## 2023-02-06 DIAGNOSIS — R627 Adult failure to thrive: Secondary | ICD-10-CM | POA: Diagnosis not present

## 2023-02-06 DIAGNOSIS — N189 Chronic kidney disease, unspecified: Secondary | ICD-10-CM | POA: Diagnosis not present

## 2023-02-12 DIAGNOSIS — F03C4 Unspecified dementia, severe, with anxiety: Secondary | ICD-10-CM | POA: Diagnosis not present

## 2023-02-12 DIAGNOSIS — F32A Depression, unspecified: Secondary | ICD-10-CM | POA: Diagnosis not present

## 2023-02-21 DIAGNOSIS — E559 Vitamin D deficiency, unspecified: Secondary | ICD-10-CM | POA: Diagnosis not present

## 2023-03-05 DIAGNOSIS — I10 Essential (primary) hypertension: Secondary | ICD-10-CM | POA: Diagnosis not present

## 2023-03-05 DIAGNOSIS — K219 Gastro-esophageal reflux disease without esophagitis: Secondary | ICD-10-CM | POA: Diagnosis not present

## 2023-03-05 DIAGNOSIS — E1122 Type 2 diabetes mellitus with diabetic chronic kidney disease: Secondary | ICD-10-CM | POA: Diagnosis not present

## 2023-03-05 DIAGNOSIS — K5901 Slow transit constipation: Secondary | ICD-10-CM | POA: Diagnosis not present

## 2023-03-05 DIAGNOSIS — N400 Enlarged prostate without lower urinary tract symptoms: Secondary | ICD-10-CM | POA: Diagnosis not present

## 2023-03-05 DIAGNOSIS — D631 Anemia in chronic kidney disease: Secondary | ICD-10-CM | POA: Diagnosis not present

## 2023-03-05 DIAGNOSIS — N1832 Chronic kidney disease, stage 3b: Secondary | ICD-10-CM | POA: Diagnosis not present

## 2023-03-05 DIAGNOSIS — J449 Chronic obstructive pulmonary disease, unspecified: Secondary | ICD-10-CM | POA: Diagnosis not present

## 2023-03-05 DIAGNOSIS — F03C0 Unspecified dementia, severe, without behavioral disturbance, psychotic disturbance, mood disturbance, and anxiety: Secondary | ICD-10-CM | POA: Diagnosis not present

## 2023-03-19 DIAGNOSIS — R630 Anorexia: Secondary | ICD-10-CM | POA: Diagnosis not present

## 2023-03-19 DIAGNOSIS — E1122 Type 2 diabetes mellitus with diabetic chronic kidney disease: Secondary | ICD-10-CM | POA: Diagnosis not present

## 2023-03-19 DIAGNOSIS — F341 Dysthymic disorder: Secondary | ICD-10-CM | POA: Diagnosis not present

## 2023-03-19 DIAGNOSIS — N189 Chronic kidney disease, unspecified: Secondary | ICD-10-CM | POA: Diagnosis not present

## 2023-03-19 DIAGNOSIS — F03C4 Unspecified dementia, severe, with anxiety: Secondary | ICD-10-CM | POA: Diagnosis not present

## 2023-03-19 DIAGNOSIS — K8020 Calculus of gallbladder without cholecystitis without obstruction: Secondary | ICD-10-CM | POA: Diagnosis not present

## 2023-03-19 DIAGNOSIS — I69391 Dysphagia following cerebral infarction: Secondary | ICD-10-CM | POA: Diagnosis not present

## 2023-03-19 DIAGNOSIS — F03C3 Unspecified dementia, severe, with mood disturbance: Secondary | ICD-10-CM | POA: Diagnosis not present

## 2023-03-19 DIAGNOSIS — R1310 Dysphagia, unspecified: Secondary | ICD-10-CM | POA: Diagnosis not present

## 2023-04-09 DIAGNOSIS — U071 COVID-19: Secondary | ICD-10-CM | POA: Diagnosis not present

## 2023-04-09 DIAGNOSIS — E1122 Type 2 diabetes mellitus with diabetic chronic kidney disease: Secondary | ICD-10-CM | POA: Diagnosis not present

## 2023-04-09 DIAGNOSIS — R630 Anorexia: Secondary | ICD-10-CM | POA: Diagnosis not present

## 2023-04-09 DIAGNOSIS — J449 Chronic obstructive pulmonary disease, unspecified: Secondary | ICD-10-CM | POA: Diagnosis not present

## 2023-04-09 DIAGNOSIS — K219 Gastro-esophageal reflux disease without esophagitis: Secondary | ICD-10-CM | POA: Diagnosis not present

## 2023-04-09 DIAGNOSIS — N189 Chronic kidney disease, unspecified: Secondary | ICD-10-CM | POA: Diagnosis not present

## 2023-04-09 DIAGNOSIS — N400 Enlarged prostate without lower urinary tract symptoms: Secondary | ICD-10-CM | POA: Diagnosis not present

## 2023-04-09 DIAGNOSIS — F03C4 Unspecified dementia, severe, with anxiety: Secondary | ICD-10-CM | POA: Diagnosis not present

## 2023-04-09 DIAGNOSIS — I69391 Dysphagia following cerebral infarction: Secondary | ICD-10-CM | POA: Diagnosis not present

## 2023-04-12 DIAGNOSIS — U071 COVID-19: Secondary | ICD-10-CM | POA: Diagnosis not present

## 2023-04-22 DEATH — deceased
